# Patient Record
Sex: Female | Born: 1940
Health system: Southern US, Community
[De-identification: ages and names within clinical notes are randomized; demographics above are authoritative.]

## PROBLEM LIST (undated history)

## (undated) DIAGNOSIS — I493 Ventricular premature depolarization: Secondary | ICD-10-CM

## (undated) DIAGNOSIS — I77810 Thoracic aortic ectasia: Secondary | ICD-10-CM

## (undated) DIAGNOSIS — T8859XA Other complications of anesthesia, initial encounter: Secondary | ICD-10-CM

## (undated) DIAGNOSIS — I2510 Atherosclerotic heart disease of native coronary artery without angina pectoris: Secondary | ICD-10-CM

## (undated) DIAGNOSIS — I10 Essential (primary) hypertension: Secondary | ICD-10-CM

## (undated) DIAGNOSIS — Z87442 Personal history of urinary calculi: Secondary | ICD-10-CM

## (undated) DIAGNOSIS — M1990 Unspecified osteoarthritis, unspecified site: Secondary | ICD-10-CM

## (undated) DIAGNOSIS — M8580 Other specified disorders of bone density and structure, unspecified site: Secondary | ICD-10-CM

## (undated) DIAGNOSIS — N201 Calculus of ureter: Secondary | ICD-10-CM

## (undated) DIAGNOSIS — I7121 Aneurysm of the ascending aorta, without rupture: Secondary | ICD-10-CM

## (undated) DIAGNOSIS — T4145XA Adverse effect of unspecified anesthetic, initial encounter: Secondary | ICD-10-CM

## (undated) DIAGNOSIS — R112 Nausea with vomiting, unspecified: Secondary | ICD-10-CM

## (undated) DIAGNOSIS — I499 Cardiac arrhythmia, unspecified: Secondary | ICD-10-CM

## (undated) DIAGNOSIS — I5032 Chronic diastolic (congestive) heart failure: Secondary | ICD-10-CM

## (undated) DIAGNOSIS — R233 Spontaneous ecchymoses: Secondary | ICD-10-CM

## (undated) DIAGNOSIS — K219 Gastro-esophageal reflux disease without esophagitis: Secondary | ICD-10-CM

## (undated) DIAGNOSIS — E785 Hyperlipidemia, unspecified: Secondary | ICD-10-CM

## (undated) DIAGNOSIS — M5030 Other cervical disc degeneration, unspecified cervical region: Secondary | ICD-10-CM

## (undated) DIAGNOSIS — R0602 Shortness of breath: Secondary | ICD-10-CM

## (undated) DIAGNOSIS — E669 Obesity, unspecified: Secondary | ICD-10-CM

## (undated) DIAGNOSIS — Z9889 Other specified postprocedural states: Secondary | ICD-10-CM

## (undated) DIAGNOSIS — I712 Thoracic aortic aneurysm, without rupture: Secondary | ICD-10-CM

## (undated) DIAGNOSIS — R238 Other skin changes: Secondary | ICD-10-CM

## (undated) HISTORY — DX: Other specified disorders of bone density and structure, unspecified site: M85.80

## (undated) HISTORY — DX: Unspecified osteoarthritis, unspecified site: M19.90

## (undated) HISTORY — DX: Aneurysm of the ascending aorta, without rupture: I71.21

## (undated) HISTORY — DX: Obesity, unspecified: E66.9

## (undated) HISTORY — DX: Hyperlipidemia, unspecified: E78.5

## (undated) HISTORY — DX: Shortness of breath: R06.02

## (undated) HISTORY — PX: ABDOMINAL HYSTERECTOMY: SHX81

## (undated) HISTORY — DX: Atherosclerotic heart disease of native coronary artery without angina pectoris: I25.10

## (undated) HISTORY — DX: Gastro-esophageal reflux disease without esophagitis: K21.9

## (undated) HISTORY — DX: Ventricular premature depolarization: I49.3

## (undated) HISTORY — DX: Thoracic aortic aneurysm, without rupture: I71.2

## (undated) HISTORY — DX: Chronic diastolic (congestive) heart failure: I50.32

## (undated) HISTORY — DX: Thoracic aortic ectasia: I77.810

## (undated) HISTORY — PX: TONSILLECTOMY: SUR1361

## (undated) HISTORY — PX: OTHER SURGICAL HISTORY: SHX169

## (undated) HISTORY — PX: KNEE ARTHROSCOPY: SUR90

---
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
## 1967-10-20 HISTORY — PX: HEMORRHOID SURGERY: SHX153

## 1970-10-19 HISTORY — PX: DILATION AND CURETTAGE OF UTERUS: SHX78

## 1986-10-19 HISTORY — PX: CHOLECYSTECTOMY: SHX55

## 1989-06-19 HISTORY — PX: OTHER SURGICAL HISTORY: SHX169

## 1998-10-19 HISTORY — PX: CYST REMOVAL HAND: SHX6279

## 1999-03-25 ENCOUNTER — Other Ambulatory Visit: Admission: RE | Admit: 1999-03-25 | Discharge: 1999-03-25 | Payer: Self-pay | Admitting: Orthopedic Surgery

## 2000-01-23 ENCOUNTER — Emergency Department (HOSPITAL_COMMUNITY): Admission: EM | Admit: 2000-01-23 | Discharge: 2000-01-23 | Payer: Self-pay | Admitting: Emergency Medicine

## 2000-03-18 ENCOUNTER — Encounter (INDEPENDENT_AMBULATORY_CARE_PROVIDER_SITE_OTHER): Payer: Self-pay | Admitting: *Deleted

## 2000-03-18 ENCOUNTER — Ambulatory Visit (HOSPITAL_COMMUNITY): Admission: RE | Admit: 2000-03-18 | Discharge: 2000-03-18 | Payer: Self-pay | Admitting: Gastroenterology

## 2000-04-06 ENCOUNTER — Emergency Department (HOSPITAL_COMMUNITY): Admission: EM | Admit: 2000-04-06 | Discharge: 2000-04-06 | Payer: Self-pay | Admitting: Emergency Medicine

## 2002-01-03 ENCOUNTER — Encounter: Payer: Self-pay | Admitting: Family Medicine

## 2002-01-03 ENCOUNTER — Encounter: Admission: RE | Admit: 2002-01-03 | Discharge: 2002-01-03 | Payer: Self-pay | Admitting: Family Medicine

## 2002-04-15 ENCOUNTER — Encounter: Payer: Self-pay | Admitting: Emergency Medicine

## 2002-04-15 ENCOUNTER — Emergency Department (HOSPITAL_COMMUNITY): Admission: EM | Admit: 2002-04-15 | Discharge: 2002-04-15 | Payer: Self-pay | Admitting: Emergency Medicine

## 2002-09-11 ENCOUNTER — Encounter: Payer: Self-pay | Admitting: Family Medicine

## 2002-09-11 ENCOUNTER — Encounter: Admission: RE | Admit: 2002-09-11 | Discharge: 2002-09-11 | Payer: Self-pay | Admitting: Family Medicine

## 2003-09-10 ENCOUNTER — Encounter: Admission: RE | Admit: 2003-09-10 | Discharge: 2003-09-10 | Payer: Self-pay | Admitting: Family Medicine

## 2004-02-04 ENCOUNTER — Ambulatory Visit (HOSPITAL_COMMUNITY): Admission: RE | Admit: 2004-02-04 | Discharge: 2004-02-06 | Payer: Self-pay | Admitting: Cardiology

## 2004-02-05 HISTORY — PX: CORONARY ANGIOPLASTY: SHX604

## 2004-08-19 ENCOUNTER — Encounter: Admission: RE | Admit: 2004-08-19 | Discharge: 2004-08-19 | Payer: Self-pay | Admitting: Cardiology

## 2004-10-10 ENCOUNTER — Encounter (INDEPENDENT_AMBULATORY_CARE_PROVIDER_SITE_OTHER): Payer: Self-pay | Admitting: *Deleted

## 2004-10-10 ENCOUNTER — Ambulatory Visit (HOSPITAL_COMMUNITY): Admission: RE | Admit: 2004-10-10 | Discharge: 2004-10-10 | Payer: Self-pay | Admitting: Gastroenterology

## 2005-05-25 ENCOUNTER — Inpatient Hospital Stay (HOSPITAL_COMMUNITY): Admission: AD | Admit: 2005-05-25 | Discharge: 2005-05-27 | Payer: Self-pay | Admitting: Cardiology

## 2007-01-18 HISTORY — PX: EYE SURGERY: SHX253

## 2007-05-19 ENCOUNTER — Ambulatory Visit (HOSPITAL_COMMUNITY): Admission: RE | Admit: 2007-05-19 | Discharge: 2007-05-19 | Payer: Self-pay | Admitting: Cardiology

## 2007-09-09 ENCOUNTER — Encounter: Admission: RE | Admit: 2007-09-09 | Discharge: 2007-09-09 | Payer: Self-pay | Admitting: Specialist

## 2007-11-18 ENCOUNTER — Encounter: Admission: RE | Admit: 2007-11-18 | Discharge: 2007-11-18 | Payer: Self-pay | Admitting: Gastroenterology

## 2007-11-19 ENCOUNTER — Emergency Department (HOSPITAL_COMMUNITY): Admission: EM | Admit: 2007-11-19 | Discharge: 2007-11-19 | Payer: Self-pay | Admitting: Emergency Medicine

## 2008-10-19 HISTORY — PX: COLONOSCOPY: SHX174

## 2008-12-28 ENCOUNTER — Encounter: Admission: RE | Admit: 2008-12-28 | Discharge: 2008-12-28 | Payer: Self-pay | Admitting: Family Medicine

## 2009-12-05 ENCOUNTER — Encounter: Admission: RE | Admit: 2009-12-05 | Discharge: 2009-12-05 | Payer: Self-pay | Admitting: Family Medicine

## 2010-02-16 HISTORY — PX: HERNIA REPAIR: SHX51

## 2010-02-20 ENCOUNTER — Ambulatory Visit (HOSPITAL_COMMUNITY): Admission: RE | Admit: 2010-02-20 | Discharge: 2010-02-21 | Payer: Self-pay | Admitting: Surgery

## 2010-11-08 ENCOUNTER — Other Ambulatory Visit: Payer: Self-pay | Admitting: Family Medicine

## 2010-11-08 DIAGNOSIS — Z139 Encounter for screening, unspecified: Secondary | ICD-10-CM

## 2010-12-08 ENCOUNTER — Ambulatory Visit
Admission: RE | Admit: 2010-12-08 | Discharge: 2010-12-08 | Disposition: A | Discharge status: Home / Self Care | Payer: MEDICARE | Source: Ambulatory Visit | Attending: Family Medicine | Admitting: Family Medicine

## 2010-12-08 DIAGNOSIS — Z139 Encounter for screening, unspecified: Secondary | ICD-10-CM

## 2011-01-06 LAB — DIFFERENTIAL
Basophils Absolute: 0 10*3/uL (ref 0.0–0.1)
Basophils Relative: 1 % (ref 0–1)
Eosinophils Absolute: 0.1 10*3/uL (ref 0.0–0.7)
Eosinophils Relative: 1 % (ref 0–5)
Lymphocytes Relative: 21 % (ref 12–46)
Lymphs Abs: 1 10*3/uL (ref 0.7–4.0)
Monocytes Absolute: 0.7 10*3/uL (ref 0.1–1.0)

## 2011-01-06 LAB — CBC
HCT: 37.6 % (ref 36.0–46.0)
MCV: 98.7 fL (ref 78.0–100.0)
Platelets: 168 10*3/uL (ref 150–400)
RDW: 13.8 % (ref 11.5–15.5)

## 2011-01-06 LAB — BASIC METABOLIC PANEL
BUN: 17 mg/dL (ref 6–23)
Creatinine, Ser: 0.89 mg/dL (ref 0.4–1.2)
GFR calc Af Amer: >60 mL/min (ref >60)
GFR calc non Af Amer: >60 mL/min (ref >60)
Glucose, Bld: 116 mg/dL — ABNORMAL HIGH (ref 70–99)
Potassium: 4.3 mEq/L (ref 3.5–5.1)

## 2011-01-06 LAB — URINALYSIS, ROUTINE W REFLEX MICROSCOPIC
Glucose, UA: NEGATIVE mg/dL
Leukocytes, UA: NEGATIVE
Nitrite: POSITIVE — AB
pH: 6.5 (ref 5.0–8.0)

## 2011-02-10 ENCOUNTER — Other Ambulatory Visit: Payer: Self-pay | Admitting: Physician Assistant

## 2011-03-06 NOTE — Op Note (Signed)
NAMECECILEE, ROSNER              ACCOUNT NO.:  [id number]   MEDICAL RECORD NO.:  [id number]          PATIENT TYPE:  AMB   LOCATION:  ENDO                         FACILITY:  [facility]   PHYSICIAN:  Petra Kuba, M.D.    DATE OF BIRTH:  [birth year]   DATE OF PROCEDURE:  10/10/2004  DATE OF DISCHARGE:                                 OPERATIVE REPORT   PROCEDURE:  Colonoscopy with biopsy.   INDICATION:  Bright red blood per rectum, patient due for colonic screening,  history of colon polyps.   CONSENT:  Consent was signed after risks, benefits, methods, options  thoroughly discussed multiple times in the past.   MEDICINES USED:  Demerol 100, Versed 10.   PROCEDURE:  Rectal inspection pertinent for external hemorrhoids.  Small  digital exam was negative.  Video pediatric adjustable colonoscope was then  inserted with some difficulty due to a tortuous sigmoid.  It was able to be  advanced to the cecum.  This required rolling her on her back and some  abdominal pressure.  No obvious abnormality was seen on insertion.  The  cecum was identified by the appendiceal orifice and the ileocecal valve.  In  fact, the scope was inserted a short ways in the terminal ileum which was  normal.  Photo documentation was obtained.  The scope was slowly withdrawn.  Prep was adequate.  There was some liquid stool that required washing and  suctioning.  On slow withdrawal through the colon, cecum, ascending,  transverse, and descending were all normal.  As the scope was withdrawn  around the sigmoid, a few tiny hyperplastic-appearing polyps were seen, were  cold biopsied, and put in the first container.  There were a few in the  rectum, as well, which were cold biopsied and put in the same container.  No  other abnormalities were seen.  The anorectal pull-through on retroflexion  confirmed some small hemorrhoids.  The scope was straightened and re-  advanced a short ways up the left side of the colon.  Air  was suctioned and  scope removed.  The patient tolerated the procedure well adequately.  There  was no obvious immediate complication.   ENDOSCOPIC DIAGNOSES:  1.  Internal and external hemorrhoids.  2.  Tortuous sigmoid.  3.  Multiple tiny rectal and distal sigmoid hyperplastic-appearing polyps      cold biopsied.  4.  Otherwise within normal limits to the terminal ileum.   PLAN:  Await pathology to determine future colonic screening.  Continue  workup with an esophagogastroduodenoscopy for her upper tract symptoms.       MEM/MEDQ  D:  10/10/2004  T:  10/11/2004  Job:  [id number]   cc:   Petra Kuba, M.D.  [address], [address]  [place]  [place] [address]  Fax: [phone]   Armanda Magic, M.D.  [address], [address]  [place], [place] [address]  Fax: [phone]   Donia Guiles, M.D.  [address] [place]  [place] [address]  Fax: [phone]

## 2011-03-06 NOTE — Discharge Summary (Signed)
NAMESARETTA, DAHLEM              ACCOUNT NO.:  [id number]   MEDICAL RECORD NO.:  [id number]          PATIENT TYPE:  INP   LOCATION:  4711                         FACILITY:  [facility]   PHYSICIAN:  Armanda Magic, M.D.     DATE OF BIRTH:  1941/01/09   DATE OF ADMISSION:  05/25/2005  DATE OF DISCHARGE:  05/27/2005                                 DISCHARGE SUMMARY   CHIEF COMPLAINT/REASON FOR ADMISSION:  Ms. Olvera is a 70 year old female  patient with history of CAD status post Taxus stent to the mid LAD in April  of 2005.  Also history of hypertension and dyslipidemia.  Last cath in  November of 2005 due to complaints of chest pain.  Stent was patent with a  30% narrowing distal to the stent that was attributed to patient's native  LAD anatomy.  The patient was subsequently started on Imdur and has had no  significant problems since.  She presented to the office on the date of  admission for her six month followup visit.  She related a one month history  of increased fatigue, exertional chest pain with associated shortness of  breath.  Pain was located in the left breast without radiation.  She had a  severe episode that was very sharp in nature and caused the patient to  double over.  The duration was about 15 minutes.  Otherwise the duration of  the pain was several minutes.  The patient has noticed chest pain with  exertion with such activity as changing bed linens or walking across the  parking lot.  Because of these symptoms, Dr. Mayford Knife was concerned about  possible progress of cardiovascular disease versus possible end-stent  restenosis, so she was admitted for planned diagnostic coronary angiography.   HOSPITAL COURSE:  Problem 1. Chest pain with history of single vessel  coronary artery disease.  The patient was admitted to [facility] to  telemetry unit.  Her home medications were continued including beta-blocker,  aspirin and Plavix.  She was started on Lovenox subcu q.12h. and  recommended  to start to IV nitroglycerin if she had continued chest pain.   She was taken to the cath lab on May 26, 2005 per Dr. Mayford Knife and there  were no changes from previous catheterization.  She still had the 30% area  distal to the stent as this was in the LAD.  She also had a widely patent  RCA, LVEF was normal.  Because of the shortness of breath and chest pain,  Dr. Mayford Knife was concerned about possible pulmonary embolus.  The patient  underwent a VQ scan on May 27, 2005 that showed no perfusion defects.  At  this time, Dr. Mayford Knife felt that pain was GI etiology.  Nexium 40 mg was  added and patient was determined appropriate for discharge home.   Because of the increasing fatigue, a TSH was checked this admission and this  was found to be normal at 2.544.   Problem 2. Hypokalemia.  The patient's potassium on the morning of the  catheterization was slightly low at 3.3.  She was given  supplemental  potassium pre-catheterization.  A repeat BMET was checked on date of  discharge.  Potassium was 3.7.  The patient will be continued on K-Dur 20  mEq at discharge.   FINAL DISCHARGE DIAGNOSES:  1.  Atypical chest pain with normal cardiac catheterization.  Please see      cath report for details.  2.  Known single vessel coronary artery disease.  Previous stent to the left      anterior descending.  3.  Hypertension, controlled.  4.  Hypokalemia.  5.  Dyslipidemia.  6.  Suspected gastroesophageal reflux disease.  New start on Nexium.   DISCHARGE MEDICATIONS:  1.  Aspirin 81 mg daily.  2.  Norvasc 10 mg daily.  3.  Micardis/HCT 40/12.5 daily.  4.  Zyrtec 10 mg daily.  5.  Vesicare 5 mg daily.  6.  Mobic 15 mg daily.  7.  Actonel 35 mg weekly.  8.  Astelin nasal spray at bedtime.  9.  Nasonex nasal spray at bedtime.  10. Patanol eye drops as previous.  11. Plavix 75 mg daily.  12. Toprol-XL 25 mg daily.  13. Fish oil caplets as before.  14. Imdur 30 mg daily.  15.  Vytorin 10/80 daily.  16. Tums 600 mg b.i.d.  17. Vitamin C, E and vitamin D as before.  18. K-Dur 20 mEq once daily.  19. Nexium 40 mg daily.   RESTRICTIONS:  Heart healthy diet.  Activity--increase activity slowly, may  shower only for the next two days.  No lifting more than 10 pounds in the  next two days.  Wound care--call if any swelling or increased bruising in  the right groin.   FOLLOWUP APPOINTMENTS:  She is to call Dr. Roselie Skinner office to be scheduled  for basic followup appointment regarding reflux.  She is to see Dr. Mayford Knife  in two weeks for a groin check.      Allison L. Ulla Gallo, M.D.  Electronically Signed    ALE/MEDQ  D:  05/27/2005  T:  05/27/2005  Job:  [id number]   cc:   Donia Guiles, M.D.  [address] [place]  [place] [address]  Fax: [phone]

## 2011-03-06 NOTE — Op Note (Signed)
Brittany Robertson, Brittany Robertson              ACCOUNT NO.:  [id number]   MEDICAL RECORD NO.:  [id number]          PATIENT TYPE:  AMB   LOCATION:  ENDO                         FACILITY:  [facility]   PHYSICIAN:  Petra Kuba, M.D.    DATE OF BIRTH:  1941/06/09   DATE OF PROCEDURE:  10/10/2004  DATE OF DISCHARGE:                                 OPERATIVE REPORT   PROCEDURE:  Esophagogastroduodenoscopy.   INDICATION:  Upper tract symptoms, some dysphagia.  Consent was signed after  risks, benefits and method was thoroughly discussed in the office on  multiple occasions.   MEDICATIONS:  Demerol 25 and Versed 2.5.   PROCEDURE IN DETAIL:  The video endoscope was inserted by direct vision.  I  doubted she had a hiatal hernia, the esophagus was normal.  The scope passed  into the stomach and advanced to the antrum were some minimal linear  antritis was seen.  Advanced through a normal pylorus into a normal duodenal  bulb and around the second portion of the duodenum.   The scope was withdrawn back to the bulb which was normal.  The scope was  withdrawn back to the stomach and retroflexed.  The angularis, cardia and  fundus, lesser and greater curvatures were normal on retroflexed  visualization.  Straight visualization of the stomach did not reveal any  additional findings.  Air was suctioned, the scope was slowly withdrawn and  again good look at the esophagus was normal.  The scope was withdrawn.  The  patient tolerated the procedure well.  There was no obvious immediate  complications.   ENDOSCOPIC DIAGNOSIS:  1.  Doubt hiatal hernia currently.  2.  Minimal antritis.  3.  Otherwise, normal esophagogastroduodenoscopy.   PLAN:  Barium swallow and barium tablet p.r.n. if swallowing problems  continue, although it seems to be just to pills only and probably  oropharyngeal dysphagia.  Continue Nexium.  Followup p.r.n. or in two months  to recheck symptoms and no further work-up plans are  needed.       MEM/MEDQ  D:  10/10/2004  T:  10/11/2004  Job:  [id number]

## 2011-03-06 NOTE — Cardiovascular Report (Signed)
Brittany Robertson, Brittany Robertson              ACCOUNT NO.:  [id number]   MEDICAL RECORD NO.:  [id number]          PATIENT TYPE:  INP   LOCATION:  4711                         FACILITY:  [facility]   PHYSICIAN:  Armanda Magic, M.D.     DATE OF BIRTH:  [birth year]   DATE OF PROCEDURE:  05/26/2005  DATE OF DISCHARGE:                              CARDIAC CATHETERIZATION   REFERRING PHYSICIAN:  Dr. Donia Guiles.   PROCEDURE:  Left heart catheterization, coronary angiography, left  ventriculography.   INDICATIONS:  Chest pain and shortness of breath.   COMPLICATIONS:  None.   IV ACCESS:  Via right femoral artery 6-French sheath.   This is a very pleasant 70 year old white female with a history of coronary  disease status post PCA stenting of the mid LAD with a Taxus stent in April  2005. She now presents with recurrent chest pain for cardiac  catheterization.   The patient is brought to cardiac catheterization laboratory in a fasting  nonsedated state. Informed consent was obtained. The patient was connected  to continuous heart rate and pulse oximetry monitoring and intermittent  blood pressure monitoring. The right groin was prepped and draped in a  sterile fashion. 1% Xylocaine was used for local anesthesia. Using modified  Seldinger technique, a 6-French sheath was placed in the right femoral  artery. Under fluoroscopic guidance, a 6-French JL-4 catheter was placed in  the left coronary artery. Multiple cine films were taken at 30 RAO and 40  LAO views. This catheter was then exchanged out over guide wire for 6-French  JR-4 catheter which was placed under fluoroscopic guidance in the right  coronary artery. Multiple cine films were taken at 30 RAO view using total  of 30 cc of contrast at 15 cc per second. The catheter was then pulled back  across the aortic valve with no significant gradient noted. At the end  procedure, all catheters and sheaths were removed. Manual compression was  performed until adequate hemostasis was obtained. The patient was  transferred back to room in stable condition.   RESULTS:  Left main coronary is widely patent and bifurcates in the left  anterior descending artery and left circumflex artery. Left anterior  descending artery has a 30% narrowing in the midportion of the vessel. There  is a stent in the proximal to midportion at the takeoff of a diagonal branch  and just distal to the stent which is widely patent and a 30% narrowing. The  ongoing LAD is widely patent throughout the course of the apex. The diagonal  branch is widely patent. It bifurcates into two daughter branches both of  which are widely patent.   Left circumflex is widely patent throughout its course giving rise to two  obtuse marginal branches both of which are widely patent.   The right coronary is widely patent throughout its course and bifurcates  distally in the posterior descending artery and posterolateral artery both  of which are widely patent.   Left ventriculography shows normal LV systolic function, EF 60%, LV pressure  110/4 mmHg, aortic pressure 114/61 mmHg.   ASSESSMENT:  1.  Nonobstructive coronary disease with a widely patent left anterior      descending artery stent and 30% stenosis distal to the stent.  2.  Normal left ventricular function.  3.  Noncardiac chest pain.   PLAN:  Check a VQ scan to rule out PE secondary to increasing shortness of  breath, chest pain and fatigue. If VQ scan is negative, discharge to home  after IV fluid and bedrest. Continue current medications. Add Nexium 40  milligrams a day. Follow up with nurse practitioner in two weeks for groin  check and follow up Dr. Donia Guiles for further workup of noncardiac  chest pain.      Armanda Magic, M.D.  Electronically Signed     TT/MEDQ  D:  05/26/2005  T:  05/27/2005  Job:  [id number]   cc:   Donia Guiles, M.D.  [address] [place]  [place] [address]  Fax:  [phone]

## 2011-03-06 NOTE — Cardiovascular Report (Signed)
NAME:  Brittany Robertson, Brittany Robertson                        ACCOUNT NO.:  [id number]   MEDICAL RECORD NO.:  [id number]                   PATIENT TYPE:  OIB   LOCATION:  6523                                 FACILITY:  [facility]   PHYSICIAN:  Francisca December, M.D.               DATE OF BIRTH:  [birth year]   DATE OF PROCEDURE:  02/05/2004  DATE OF DISCHARGE:                              CARDIAC CATHETERIZATION   INDICATION:  A 70 year old woman with atypical angina has undergone coronary  angiography by Dr. Armanda Magic revealing a subtotal stenosis of the mid  anterior descending artery.  She is brought now to the cardiac  catheterization laboratory to allow for percutaneous revascularization at  this time.   PROCEDURAL NOTE:  The patient was brought to the cardiac catheterization  laboratory in a fasting state.  The right groin was prepped and draped in  the usual sterile fashion.  Local anesthesia was obtained with infiltration  of 1% lidocaine.  A #6 French catheter sheath was inserted percutaneously  into the right femoral artery using an anterior approach over a guiding J  wire.  A #6 French #4 CLS guiding catheter was advanced to the ascending  aorta where the left coronary os was engaged.  The patient received 5700  units of heparin intravenously as well as a high bolus of Aggrastat at 25  mcg/kg and then a constant infusion at 0.15 mg/kg/min.  The resultant ACT  was 313 seconds.  A 0.014 inch Scimed Luge intracoronary guidewire was  passed across the lesion of the mid LAD without difficulty.  The lesion was  initially dilated with a 3.0 x 15 mm Scimed Maverick intracoronary balloon.  This was inflamed to 6 atmospheres for 36 seconds.  This device was removed  and a 3.5/16 mm Scimed Taxus Express II intracoronary drug-eluting stent was  advanced into place across the lesion.  This was deployed to a peak pressure  of 14 atmospheres for 60 seconds.  At this point, the balloon was withdrawn  as well  as the wire, which was then advanced down a large diagonal branch.  The Taxus balloon was used to inflate the side branch to 6 atmospheres for  19 seconds.  The wire was then withdrawn out of the diagonal branch,  replaced down the main anterior descending artery and the Taxus balloon  again inflated to 9 atmospheres for 20 seconds.  It was apparent at this  point, that plaque shifting was occurring.  The initial Luge wire could not  longer be manipulated down the diagonal.  It was remove and replaced with a  new Luge.  This was placed down the diagonal without difficulty.  The 3.0/15  mm Scimed Maverick intracoronary balloon was placed across the origin of the  diagonal and inflated there to 4 atmospheres for 23 seconds.  It was  withdrawn, as well as the wire.  The wire was re-advanced down  the anterior  descending artery and the Taxus balloon replaced across the stent.  It was  inflated to 6 atmospheres for 19 seconds.  These maneuvers resulted in good  patency of both the main branch and the large side branch.  Following  confirmation of adequate patency in orthogonal views both with and without  the guidewire in place, the guiding catheter was removed.  An RAO right  femoral arteriogram was performed using the side port of the sheath for  injection.  It demonstrated the arteriotomy site to be just above the  bifurcation of the profunda femoris and superficial femoral arteries.  There  was no significant atherosclerotic disease seen.  Angio-Seal was then  successfully deployed with good hemostasis following removal of the arterial  sheath.   The patient was transported to recovery area in stable condition with an  intact distal pulse.   ANGIOGRAPHY:  As mentioned, the lesion treated was a 95% stenosis in the mid  portion of the left anterior descending artery, just after the bifurcation  to a large diagonal branch.  Following balloon dilatation and stent  implantation, there was no  residual stenosis of the anterior descending  artery or in the diagonal branch itself.   IMPRESSION:  1. Atherosclerotic cardiovascular disease, single vessel.  2. Status post successful PCI/drug-eluting stent implantation, mid anterior     descending artery.                                               Francisca December, M.D.    JHE/MEDQ  D:  02/05/2004  T:  02/07/2004  Job:  [id number]   cc:   Armanda Magic, M.D.  [address]  [place], [place] [address]  Fax: [phone]

## 2011-03-06 NOTE — Procedures (Signed)
[place]  Patient:    Brittany Robertson, Brittany Robertson                       MRN: [id number] Proc. Date: 03/18/00 Adm. Date:  [id number] Disc. Date: [id number] Attending:  Nelda Marseille CC:         Desma Maxim, M.D.                           Procedure Report  PROCEDURE:  Colonoscopy with biopsy.  ENDOSCOPIST:  Petra Kuba, M.D.  INDICATIONS:  Bright red blood per rectum in a patient with probable IBS and hemorrhoids.  Consent was signed after risks, benefits, methods, and options were thoroughly discussed in the office.  MEDICINES USED:  Demerol 100 and Versed 10.  DESCRIPTION OF PROCEDURE:  Rectal inspection was pertinent for obvious external hemorrhoids.  Digital exam was negative.  The video pediatric colonoscope was inserted and fairly easily advanced around the colon to the cecum.  This did require some left lower quadrant pressure, but no position changes.  The cecum was identified by the appendiceal orifice and the ileocecal valve.  Prep was adequate.  There was some liquid stool that required washing and suctioning.  On slow withdrawal through the colon, the cecum, ascending, and transverse were normal.  In the ______ splenic flexure, a tiny 1-2 mm polyp along the fold was seen and cold biopsied x 1.  The scope was further withdrawn.  There were additional multiple tiny left-sided hyperplastic-appearing polyps which were cold biopsied and all put in the same container.  No other abnormalities were seen as we slowly withdrew back to the rectum.  None of these polyps had a worrisome appearance, and were all 1-2 mm. Once back in the rectum, the scope was retroflexed and pertinent for some internal hemorrhoids.  The scope was straigthened and readvanced a short ways up the sigmoid.  Air was suctioned and the scope removed.  The patient tolerated the procedure well.  There was no obvious or immediate complication.  ENDOSCOPIC DIAGNOSES: 1.  Internal and external hemorrhoids. 2. Multiple tiny left-sided hyperplastic-appearing polyps, status post cold    biopsy. 3. Otherwise within normal limits to the cecum.  PLAN:  Await pathology to determine future colonic screening.  Continue workup with an EGD. DD:  03/18/00 TD:  03/22/00 Job: [id number]

## 2011-03-06 NOTE — Procedures (Signed)
[place]  Patient:    Brittany Robertson, Brittany Robertson                       MRN: [id number] Proc. Date: 03/18/00 Adm. Date:  [id number] Disc. Date: [id number] Attending:  Nelda Marseille CC:         Petra Kuba, M.D.             Desma Maxim, M.D.                           Procedure Report  PROCEDURE:  Esophagogastroduodenoscopy.  INDICATIONS:  Upper tract symptoms.  Consent was signed after risks, benefits, methods, and options thoroughly discussed in the office.  MEDICINES USED:  No additional medicines, since she already had the colonoscopy.  DESCRIPTION OF PROCEDURE:  The video endoscope was inserted by direct vision. The esophagus was pertinent for some mild reflux changes but no signs of Barretts or significant esophagitis.  In the distal esophagus was a small hiatal hernia.  The scope had trouble holding air and did have some retching throughout the procedure.  The scope was inserted through her pylorus into a normal duodenal bulb and around the _____ to a normal second portion of the duodenum.  The scope was withdrawn back to the bulb, and a quick look there confirmed its normal appearance.  The scope was withdrawn back to the stomach and retroflexed.  Cardia, fundus, angularis, lesser and greater curve were all evaluated on retroflex and then straight visualization and other than some mild gastritis, no other abnormalities were seen.  The scope was then slowly withdrawn through the esophagus.  Although she continued to retch, only the minimal reflux changes were seen without any worrisome esophageal lesion.  The scope was removed.  The patient tolerated the procedure fairly well.  There was no obvious immediate complication.  ENDOSCOPIC DIAGNOSES: 1. Small hiatal hernia. 2. Minimal esophageal reflux changes. 3. Minimal gastritis throughout. 4. Otherwise within normal limits EGD.  PLAN:  Continue Nexium.  Follow up p.r.n. or in six to  eight weeks to recheck symptoms, probably guaiacs, and make sure no further workup plans are needed. DD:  03/18/00 TD:  03/22/00 Job: [id number]

## 2011-03-06 NOTE — Cardiovascular Report (Signed)
NAME:  Brittany Robertson, Brittany Robertson                        ACCOUNT NO.:  [id number]   MEDICAL RECORD NO.:  [id number]                   PATIENT TYPE:  OIB   LOCATION:  6523                                 FACILITY:  [facility]   PHYSICIAN:  Armanda Magic, M.D.                  DATE OF BIRTH:  08/22/1941   DATE OF PROCEDURE:  02/04/2004  DATE OF DISCHARGE:                              CARDIAC CATHETERIZATION   PROCEDURE:  1. Left heart catheterization.  2. Coronary angiography.  3. Left ventriculography.   OPERATOR:  Armanda Magic, M.D.   INDICATIONS:  Chest pain, abnormal Cardiolite.   COMPLICATIONS:  None.   IV ACCESS:  Via right femoral artery with 6 French sheath.   This is a 70 year old female with no previous cardiac history.  She does  have a history though of hyperlipidemia and hypertension and a family  history of coronary disease, also shortness of breath.  She now presents for  heart catheterization for workup of abnormal Cardiolite, chest pain and  shortness of breath.   The patient is brought to the cardiac catheterization laboratory in a  fasting nonsedated state.  Informed consent was obtained.  The patient was  connected to continuous heart rate and pulse oximetry monitoring,  intermittent blood pressure monitoring.  The right groin was prepped and  draped in a sterile fashion.  1% Xylocaine was used for local anesthesia.  Using modified Seldinger technique, a 6 French sheath was placed in the  right femoral artery.  Under fluoroscopic guidance, a 6 [place] JL-4 catheter  was placed in the left coronary artery.  Multiple cine films were taken at  30-degree RAO, 40-degree LAO views.  This catheter was then  exchanged out  over guide wire for 6 [place] JR-4 catheter which was placed under  fluoroscopic guidance in the right coronary artery.  Multiple cine films  were taken at 30-degree RAO, 40-degree LAO views.  The catheter was then  exchanged over guide wire for 6 French angled  pigtail catheter which was  placed under fluoroscopic guidance in the left ventricular cavity.  Left  ventriculography was performed in 30-degree RAO view using total 30 ml  contrast at 15 ml per second.  The catheter was then pulled back across the  aortic valve with no significant gradient.  At the end of the procedure, all  catheters and sheaths were removed.  Manual compression was performed until  adequate hemostasis was obtained.  The patient was transferred back to the  room in stable condition.   RESULTS:  Left main coronary artery is widely patent and bifurcates into the  left anterior descending artery and left circumflex artery.   The left anterior descending artery has a 20-30% proximal narrowing before  the take off of a first diagonal branch.  The diagonal one is widely patent.  Just distal to the take off of the first diagonal, there is a 95%  apple core  lesion or concentric lesion noted.  The rest of the LAD is widely patent to  the apex.  The left circumflex is widely patent and traverses the AV groove.  It gives rise to two high obtuse marginal branches, both of which are widely  patent.   The right coronary artery is widely patent throughout its course and  bifurcates distally in a posterior descending artery and posterior lateral  artery, both of which are widely patent.   Left ventriculography shows normal LV systolic function.  Left ventricular  pressure 133/14 mmHg, aortic pressure 125/70 mmHg, LVEDP 23 mmHg.   ASSESSMENT:  1. One-vessel obstructive coronary artery disease.  2. Normal left ventricular function.   PLAN:  Percutaneous coronary intervention of the LAD per Dr. Amil Amen on  April 19.  Admit to telemetry bed, IV heparin drip, aspirin, Plavix, Toprol  and Norvasc.  Continue all other medications.                                               Armanda Magic, M.D.    TT/MEDQ  D:  02/04/2004  T:  02/05/2004  Job:  [id number]

## 2011-05-29 HISTORY — PX: HAND SURGERY: SHX662

## 2011-07-09 LAB — INFLUENZA A+B VIRUS AG-DIRECT(RAPID): Influenza B Ag: NEGATIVE

## 2011-07-09 LAB — RAPID STREP SCREEN (MED CTR MEBANE ONLY): Streptococcus, Group A Screen (Direct): NEGATIVE

## 2011-09-27 ENCOUNTER — Emergency Department (HOSPITAL_COMMUNITY): Payer: 59

## 2011-09-27 ENCOUNTER — Encounter: Payer: Self-pay | Admitting: Emergency Medicine

## 2011-09-27 ENCOUNTER — Inpatient Hospital Stay (HOSPITAL_COMMUNITY)
Admission: EM | Admit: 2011-09-27 | Discharge: 2011-09-29 | DRG: 206 | Disposition: A | Discharge status: Home Health | Payer: 59 | Attending: General Surgery | Admitting: General Surgery

## 2011-09-27 ENCOUNTER — Other Ambulatory Visit: Payer: Self-pay

## 2011-09-27 DIAGNOSIS — IMO0002 Reserved for concepts with insufficient information to code with codable children: Secondary | ICD-10-CM | POA: Insufficient documentation

## 2011-09-27 DIAGNOSIS — S8000XA Contusion of unspecified knee, initial encounter: Secondary | ICD-10-CM | POA: Diagnosis present

## 2011-09-27 DIAGNOSIS — M25569 Pain in unspecified knee: Secondary | ICD-10-CM | POA: Diagnosis present

## 2011-09-27 DIAGNOSIS — I10 Essential (primary) hypertension: Secondary | ICD-10-CM | POA: Insufficient documentation

## 2011-09-27 DIAGNOSIS — R079 Chest pain, unspecified: Secondary | ICD-10-CM

## 2011-09-27 DIAGNOSIS — G8911 Acute pain due to trauma: Secondary | ICD-10-CM

## 2011-09-27 DIAGNOSIS — S2242XA Multiple fractures of ribs, left side, initial encounter for closed fracture: Secondary | ICD-10-CM | POA: Diagnosis present

## 2011-09-27 DIAGNOSIS — Z880 Allergy status to penicillin: Secondary | ICD-10-CM

## 2011-09-27 DIAGNOSIS — Z886 Allergy status to analgesic agent status: Secondary | ICD-10-CM

## 2011-09-27 DIAGNOSIS — S2249XA Multiple fractures of ribs, unspecified side, initial encounter for closed fracture: Principal | ICD-10-CM | POA: Diagnosis present

## 2011-09-27 DIAGNOSIS — S20219A Contusion of unspecified front wall of thorax, initial encounter: Secondary | ICD-10-CM | POA: Diagnosis present

## 2011-09-27 DIAGNOSIS — Z882 Allergy status to sulfonamides status: Secondary | ICD-10-CM

## 2011-09-27 DIAGNOSIS — W19XXXA Unspecified fall, initial encounter: Secondary | ICD-10-CM | POA: Diagnosis present

## 2011-09-27 DIAGNOSIS — E669 Obesity, unspecified: Secondary | ICD-10-CM | POA: Diagnosis present

## 2011-09-27 HISTORY — DX: Essential (primary) hypertension: I10

## 2011-09-27 LAB — CBC
HCT: 36.4 % (ref 36.0–46.0)
Hemoglobin: 12.4 g/dL (ref 12.0–15.0)
MCHC: 34.1 g/dL (ref 30.0–36.0)
MCHC: 33.1 g/dL (ref 30.0–36.0)
Platelets: 150 10*3/uL (ref 150–400)
RBC: 3.85 MIL/uL — ABNORMAL LOW (ref 3.87–5.11)
RDW: 13.4 % (ref 11.5–15.5)
WBC: 4.1 10*3/uL (ref 4.0–10.5)

## 2011-09-27 LAB — DIFFERENTIAL
Lymphocytes Relative: 18 % (ref 12–46)
Lymphs Abs: 1.4 10*3/uL (ref 0.7–4.0)
Monocytes Absolute: 0.9 10*3/uL (ref 0.1–1.0)
Monocytes Relative: 11 % (ref 3–12)
Neutro Abs: 5.5 10*3/uL (ref 1.7–7.7)
Neutrophils Relative %: 70 % (ref 43–77)

## 2011-09-27 LAB — COMPREHENSIVE METABOLIC PANEL
Albumin: 3.5 g/dL (ref 3.5–5.2)
Alkaline Phosphatase: 71 U/L (ref 39–117)
BUN: 27 mg/dL — ABNORMAL HIGH (ref 6–23)
CO2: 22 mEq/L (ref 19–32)
Chloride: 102 mEq/L (ref 96–112)
Creatinine, Ser: 0.83 mg/dL (ref 0.50–1.10)
GFR calc Af Amer: 80 mL/min — ABNORMAL LOW (ref >90)
GFR calc non Af Amer: 69 mL/min — ABNORMAL LOW (ref >90)
Glucose, Bld: 117 mg/dL — ABNORMAL HIGH (ref 70–99)
Potassium: 3.9 mEq/L (ref 3.5–5.1)
Total Bilirubin: 0.5 mg/dL (ref 0.3–1.2)

## 2011-09-27 LAB — CREATININE, SERUM
Creatinine, Ser: 0.7 mg/dL (ref 0.50–1.10)
GFR calc Af Amer: >90 mL/min (ref >90)
GFR calc non Af Amer: 85 mL/min — ABNORMAL LOW (ref >90)

## 2011-09-27 MED ORDER — HYDROMORPHONE 0.3 MG/ML IV SOLN
INTRAVENOUS | Status: DC
  Administered 2011-09-27: 21:00:00 via INTRAVENOUS
  Administered 2011-09-28: 1.2 mL via INTRAVENOUS
  Administered 2011-09-28: 0.9 mg via INTRAVENOUS
  Administered 2011-09-28: 0.6 mL via INTRAVENOUS

## 2011-09-27 MED ORDER — MORPHINE SULFATE 4 MG/ML IJ SOLN
4.0000 mg | Freq: Once | INTRAMUSCULAR | Status: AC
  Administered 2011-09-27: 4 mg via INTRAVENOUS
  Filled 2011-09-27: qty 1

## 2011-09-27 MED ORDER — EZETIMIBE 10 MG PO TABS
10.0000 mg | ORAL_TABLET | Freq: Every day | ORAL | Status: DC
  Administered 2011-09-27 – 2011-09-28 (×2): 10 mg via ORAL
  Filled 2011-09-27 (×3): qty 1

## 2011-09-27 MED ORDER — METOPROLOL SUCCINATE 12.5 MG HALF TABLET
12.5000 mg | ORAL_TABLET | Freq: Every day | ORAL | Status: DC
  Administered 2011-09-28 – 2011-09-29 (×2): 12.5 mg via ORAL
  Filled 2011-09-27 (×2): qty 1

## 2011-09-27 MED ORDER — NITROGLYCERIN 0.4 MG SL SUBL: 0.4000 mg | SUBLINGUAL_TABLET | SUBLINGUAL | Status: DC | PRN

## 2011-09-27 MED ORDER — HYDROCHLOROTHIAZIDE 12.5 MG PO CAPS
12.5000 mg | ORAL_CAPSULE | Freq: Every day | ORAL | Status: DC
  Administered 2011-09-28 – 2011-09-29 (×2): 12.5 mg via ORAL
  Filled 2011-09-27 (×2): qty 1

## 2011-09-27 MED ORDER — ONDANSETRON HCL 4 MG/2ML IJ SOLN
4.0000 mg | Freq: Once | INTRAMUSCULAR | Status: AC
  Administered 2011-09-27: 4 mg via INTRAVENOUS

## 2011-09-27 MED ORDER — PANTOPRAZOLE SODIUM 40 MG PO TBEC
40.0000 mg | DELAYED_RELEASE_TABLET | Freq: Every day | ORAL | Status: DC
  Administered 2011-09-27 – 2011-09-29 (×3): 40 mg via ORAL
  Filled 2011-09-27 (×3): qty 1

## 2011-09-27 MED ORDER — AMLODIPINE BESYLATE 5 MG PO TABS
5.0000 mg | ORAL_TABLET | Freq: Every day | ORAL | Status: DC
  Administered 2011-09-27 – 2011-09-28 (×2): 5 mg via ORAL
  Filled 2011-09-27 (×3): qty 1

## 2011-09-27 MED ORDER — ONDANSETRON HCL 4 MG/2ML IJ SOLN
INTRAMUSCULAR | Status: AC
  Filled 2011-09-27: qty 2

## 2011-09-27 MED ORDER — HYDROMORPHONE 0.3 MG/ML IV SOLN
INTRAVENOUS | Status: AC
  Filled 2011-09-27: qty 25

## 2011-09-27 MED ORDER — ASPIRIN 81 MG PO CHEW
81.0000 mg | CHEWABLE_TABLET | Freq: Every day | ORAL | Status: DC
  Administered 2011-09-27 – 2011-09-28 (×2): 81 mg via ORAL
  Filled 2011-09-27 (×3): qty 1

## 2011-09-27 MED ORDER — SODIUM CHLORIDE 0.9 % IV SOLN
Freq: Once | INTRAVENOUS | Status: AC
  Administered 2011-09-27: 10:00:00 via INTRAVENOUS

## 2011-09-27 MED ORDER — SIMVASTATIN 20 MG PO TABS
20.0000 mg | ORAL_TABLET | Freq: Every day | ORAL | Status: DC
  Administered 2011-09-28: 20 mg via ORAL
  Filled 2011-09-27 (×2): qty 1

## 2011-09-27 MED ORDER — OLMESARTAN MEDOXOMIL 20 MG PO TABS
20.0000 mg | ORAL_TABLET | Freq: Every day | ORAL | Status: DC
  Administered 2011-09-28 – 2011-09-29 (×2): 20 mg via ORAL
  Filled 2011-09-27 (×2): qty 1

## 2011-09-27 MED ORDER — DIPHENHYDRAMINE HCL 50 MG/ML IJ SOLN: 12.5000 mg | Freq: Four times a day (QID) | INTRAMUSCULAR | Status: DC | PRN

## 2011-09-27 MED ORDER — HYDROMORPHONE 0.3 MG/ML IV SOLN: INTRAVENOUS | Status: DC

## 2011-09-27 MED ORDER — ONDANSETRON HCL 4 MG/2ML IJ SOLN: 4.0000 mg | Freq: Four times a day (QID) | INTRAMUSCULAR | Status: DC | PRN

## 2011-09-27 MED ORDER — ONDANSETRON HCL 4 MG/2ML IJ SOLN
4.0000 mg | Freq: Four times a day (QID) | INTRAMUSCULAR | Status: DC | PRN
  Administered 2011-09-27 – 2011-09-28 (×2): 4 mg via INTRAVENOUS
  Filled 2011-09-27: qty 2

## 2011-09-27 MED ORDER — TELMISARTAN-HCTZ 40-12.5 MG PO TABS: 1.0000 | ORAL_TABLET | ORAL | Status: DC

## 2011-09-27 MED ORDER — HYDROMORPHONE HCL PF 1 MG/ML IJ SOLN
INTRAMUSCULAR | Status: AC
  Administered 2011-09-27: 1 mg via INTRAVENOUS
  Filled 2011-09-27: qty 1

## 2011-09-27 MED ORDER — DOCUSATE SODIUM 100 MG PO CAPS
100.0000 mg | ORAL_CAPSULE | Freq: Two times a day (BID) | ORAL | Status: DC
  Administered 2011-09-27 – 2011-09-29 (×4): 100 mg via ORAL
  Filled 2011-09-27 (×4): qty 1

## 2011-09-27 MED ORDER — KCL IN DEXTROSE-NACL 20-5-0.45 MEQ/L-%-% IV SOLN
INTRAVENOUS | Status: DC
  Administered 2011-09-27 – 2011-09-28 (×2): via INTRAVENOUS
  Filled 2011-09-27 (×3): qty 1000

## 2011-09-27 MED ORDER — ONDANSETRON HCL 4 MG PO TABS: 4.0000 mg | ORAL_TABLET | Freq: Four times a day (QID) | ORAL | Status: DC | PRN

## 2011-09-27 MED ORDER — IOHEXOL 300 MG/ML  SOLN
100.0000 mL | Freq: Once | INTRAMUSCULAR | Status: AC | PRN
  Administered 2011-09-27: 100 mL via INTRAVENOUS

## 2011-09-27 MED ORDER — SODIUM CHLORIDE 0.9 % IJ SOLN: 9.0000 mL | INTRAMUSCULAR | Status: DC | PRN

## 2011-09-27 MED ORDER — ENOXAPARIN SODIUM 40 MG/0.4ML ~~LOC~~ SOLN
40.0000 mg | Freq: Every day | SUBCUTANEOUS | Status: DC
  Administered 2011-09-27: 40 mg via SUBCUTANEOUS
  Filled 2011-09-27 (×2): qty 0.4

## 2011-09-27 MED ORDER — ISOSORBIDE MONONITRATE ER 30 MG PO TB24
30.0000 mg | ORAL_TABLET | Freq: Every day | ORAL | Status: DC
  Administered 2011-09-28 – 2011-09-29 (×2): 30 mg via ORAL
  Filled 2011-09-27 (×2): qty 1

## 2011-09-27 MED ORDER — AZELASTINE HCL 0.1 % NA SOLN
1.0000 | Freq: Two times a day (BID) | NASAL | Status: DC
  Filled 2011-09-27: qty 30

## 2011-09-27 MED ORDER — HYDROMORPHONE HCL PF 1 MG/ML IJ SOLN
1.0000 mg | INTRAMUSCULAR | Status: DC | PRN
  Administered 2011-09-27: 1 mg via INTRAVENOUS

## 2011-09-27 MED ORDER — DIPHENHYDRAMINE HCL 12.5 MG/5ML PO ELIX
12.5000 mg | ORAL_SOLUTION | Freq: Four times a day (QID) | ORAL | Status: DC | PRN
  Filled 2011-09-27: qty 5

## 2011-09-27 MED ORDER — NALOXONE HCL 0.4 MG/ML IJ SOLN: 0.4000 mg | INTRAMUSCULAR | Status: DC | PRN

## 2011-09-27 MED ORDER — FESOTERODINE FUMARATE ER 8 MG PO TB24
8.0000 mg | ORAL_TABLET | Freq: Every day | ORAL | Status: DC
  Administered 2011-09-27 – 2011-09-29 (×3): 8 mg via ORAL
  Filled 2011-09-27 (×3): qty 1

## 2011-09-27 NOTE — ED Notes (Signed)
Gave old and new ECG to Dr. Judd Lien after I performed. 11:35 am JG

## 2011-09-27 NOTE — ED Notes (Signed)
Received pt via EMS from home with c/o trip and fall onto knees. Pt c/o B/L knee pain and diaphram pain.

## 2011-09-27 NOTE — ED Provider Notes (Addendum)
History     CSN: [id number] Arrival date & time: 09/27/2011  9:26 AM   First MD Initiated Contact with Patient 09/27/11 0935      Chief Complaint  Patient presents with  . Fall  . Knee Pain    (Consider location/radiation/quality/duration/timing/severity/associated sxs/prior treatment) HPI Comments: Walking into church, tripped over step and fell forward.  She injured both knees and chest.  No head trauma.  No loc.    Patient is a 70 y.o. female presenting with fall and knee pain. The history is provided by the patient.  Fall The accident occurred less than 1 hour ago. The fall occurred while walking. She fell from a height of 1 to 2 ft. She landed on a hard floor. There was no blood loss. The pain is moderate. She was not ambulatory at the scene.  Knee Pain    Past Medical History  Diagnosis Date  . Hypertension   . Degenerative disk disease     Past Surgical History  Procedure Date  . Knee arthroscopy   . Stents     History reviewed. No pertinent family history.  History  Substance Use Topics  . Smoking status: Never Smoker   . Smokeless tobacco: Not on file  . Alcohol Use: Yes    OB History    Grav Para Term Preterm Abortions TAB SAB Ect Mult Living                  Review of Systems  All other systems reviewed and are negative.    Allergies  Codeine; Sulfa antibiotics; and Penicillins  Home Medications  No current outpatient prescriptions on file.  BP 102/42  Pulse 71  Temp(Src) 97.7 F (36.5 C) (Oral)  Resp 21  SpO2 100%  Physical Exam  Nursing note and vitals reviewed. Constitutional: She is oriented to person, place, and time. She appears well-developed.  HENT:  Head: Normocephalic and atraumatic.  Eyes: Pupils are equal, round, and reactive to light.  Neck: Normal range of motion. Neck supple.  Cardiovascular: Normal rate and regular rhythm.   No murmur heard. Pulmonary/Chest: Effort normal and breath sounds normal. No respiratory  distress.  Abdominal: Soft. Bowel sounds are normal. She exhibits no distension. There is no tenderness.  Musculoskeletal:       There is ttp over the anterior chest wall.  There is no crepitus.  Breath sounds are equal bilaterally.   Both knees appear grossly normal.  No effusion noted.  Pain with rom.  Neurological: She is alert and oriented to person, place, and time.  Skin: Skin is warm and dry.    ED Course  Procedures (including critical care time)  Labs Reviewed - No data to display No results found.   No diagnosis found.   Date: 09/27/2011  Rate: 67  Rhythm: normal sinus rhythm  QRS Axis: normal  Intervals: normal  ST/T Wave abnormalities: normal  Conduction Disutrbances:none  Narrative Interpretation:   Old EKG Reviewed: unchanged    MDM  The patient fell and injured her ribs, fracturing two of them.  This was found on CT scan but no other intrathoracic or intra-abdominal pathology was found.  I will consult trauma (Dr. Luisa Hart) who will see the patient in the ED.          Geoffery Lyons, MD 09/27/11 1431  Geoffery Lyons, MD 09/27/11 [phone]

## 2011-09-27 NOTE — ED Notes (Signed)
Attempted to take pt upstairs, pt began to vomit due to "motion sickness." zofran given and told pt we would take her up after the medication began to work.

## 2011-09-27 NOTE — H&P (Signed)
Brittany Robertson is an 70 y.o. female.   Chief Complaint: Fall chest pain HPI: Pt fell at church today.  Fell on chest .  Complaining of chest pain.  Severe especially with a deep breath.  On the left side. Complaining of bilateral knee pain.  Past Medical History  Diagnosis Date  . Hypertension   . Degenerative disk disease     Past Surgical History  Procedure Date  . Knee arthroscopy   . Stents    No current facility-administered medications on file prior to encounter.   No current outpatient prescriptions on file prior to encounter.  No medication comments found. History reviewed. No pertinent family history. Social History:  reports that she has never smoked. She does not have any smokeless tobacco history on file. She reports that she drinks alcohol. She reports that she does not use illicit drugs.  Allergies:  Allergies  Allergen Reactions  . Adhesive (Tape) Other (See Comments)    irritation  . Codeine Nausea And Vomiting  . Sulfa Antibiotics Hives and Itching  . Penicillins Itching and Rash    Medications Prior to Admission  Medication Dose Route Frequency Provider Last Rate Last Dose  . 0.9 %  sodium chloride infusion   Intravenous Once Geoffery Lyons, MD      . iohexol (OMNIPAQUE) 300 MG/ML solution 100 mL  100 mL Intravenous Once PRN Medication Radiologist   100 mL at 09/27/11 1340  . morphine 4 MG/ML injection 4 mg  4 mg Intravenous Once Geoffery Lyons, MD   4 mg at 09/27/11 1028  . morphine 4 MG/ML injection 4 mg  4 mg Intravenous Once Geoffery Lyons, MD   4 mg at 09/27/11 1224  . morphine 4 MG/ML injection 4 mg  4 mg Intravenous Once Geoffery Lyons, MD   4 mg at 09/27/11 1437  . ondansetron (ZOFRAN) injection 4 mg  4 mg Intravenous Once Geoffery Lyons, MD   4 mg at 09/27/11 1120   No current outpatient prescriptions on file as of 09/27/2011.    Results for orders placed during the hospital encounter of 09/27/11 (from the past 48 hour(s))  CBC     Status: Abnormal   Collection Time   09/27/11 11:39 AM      Component Value Range Comment   WBC 7.8  4.0 - 10.5 (K/uL)    RBC 3.85 (*) 3.87 - 5.11 (MIL/uL)    Hemoglobin 12.4  12.0 - 15.0 (g/dL)    HCT 56.2  13.0 - 86.5 (%)    MCV 94.5  78.0 - 100.0 (fL)    MCH 32.2  26.0 - 34.0 (pg)    MCHC 34.1  30.0 - 36.0 (g/dL)    RDW 78.4  69.6 - 29.5 (%)    Platelets 176  150 - 400 (K/uL)   DIFFERENTIAL     Status: Normal   Collection Time   09/27/11 11:39 AM      Component Value Range Comment   Neutrophils Relative 70  43 - 77 (%)    Neutro Abs 5.5  1.7 - 7.7 (K/uL)    Lymphocytes Relative 18  12 - 46 (%)    Lymphs Abs 1.4  0.7 - 4.0 (K/uL)    Monocytes Relative 11  3 - 12 (%)    Monocytes Absolute 0.9  0.1 - 1.0 (K/uL)    Eosinophils Relative 1  0 - 5 (%)    Eosinophils Absolute 0.0  0.0 - 0.7 (K/uL)    Basophils  Relative 0  0 - 1 (%)    Basophils Absolute 0.0  0.0 - 0.1 (K/uL)   COMPREHENSIVE METABOLIC PANEL     Status: Abnormal   Collection Time   09/27/11 11:39 AM      Component Value Range Comment   Sodium 136  135 - 145 (mEq/L)    Potassium 3.9  3.5 - 5.1 (mEq/L)    Chloride 102  96 - 112 (mEq/L)    CO2 22  19 - 32 (mEq/L)    Glucose, Bld 117 (*) 70 - 99 (mg/dL)    BUN 27 (*) 6 - 23 (mg/dL)    Creatinine, Ser 4.09  0.50 - 1.10 (mg/dL)    Calcium 9.4  8.4 - 10.5 (mg/dL)    Total Protein 6.5  6.0 - 8.3 (g/dL)    Albumin 3.5  3.5 - 5.2 (g/dL)    AST 90 (*) 0 - 37 (U/L) HEMOLYSIS AT THIS LEVEL MAY AFFECT RESULT   ALT 56 (*) 0 - 35 (U/L)    Alkaline Phosphatase 71  39 - 117 (U/L)    Total Bilirubin 0.5  0.3 - 1.2 (mg/dL)    GFR calc non Af Amer 69 (*) >90 (mL/min)    GFR calc Af Amer 80 (*) >90 (mL/min)    Dg Ribs Bilateral W/chest  09/27/2011  *RADIOLOGY REPORT*  Clinical Data: Fall.  Bilateral anterior rib and chest wall pain.  BILATERAL RIBS AND CHEST - 4+ VIEW  Comparison: None.  Findings: No rib fractures or other bone lesions are identified. No evidence of pneumothorax or hemothorax.  Low  lung volumes are seen however both lungs are clear.  Heart size and mediastinal contours are within normal limits.  IMPRESSION: Negative.  Original Report Authenticated By: Danae Orleans, M.D.   Ct Chest W Contrast  09/27/2011  *RADIOLOGY REPORT*  Clinical Data:  Fall, upper abdominal pain, lower anterior rib pain  CT CHEST, ABDOMEN AND PELVIS WITH CONTRAST  Technique:  Multidetector CT imaging of the chest, abdomen and pelvis was performed following the standard protocol during bolus administration of intravenous contrast.  Contrast: OMNIPAQUE IOHEXOL 300 MG/ML IV SOLN  Comparison:  None  CT CHEST  Findings:  Minimal dependent atelectasis in the bilateral lower lobes.  No suspicious pulmonary nodules. No pleural effusion or pneumothorax.  Visualized thyroid is unremarkable.  The heart is normal in size.  No pericardial effusion.  Coronary atherosclerosis.  Atherosclerotic of the base of the aortic arch.  No mediastinal hematoma.  No suspicious mediastinal, hilar, or axillary lymphadenopathy.  Degenerative changes of the thoracic spine.  Mild deformity of the left anterolateral 4th and 5th ribs (series 2/images 26 and 31).  IMPRESSION: Mild deformity of the left anterolateral 4th and 5th ribs, correlate with the site of the patient's pain for acute fracture.  Otherwise, no evidence of acute traumatic injury to the chest.  CT ABDOMEN AND PELVIS  Findings:  Small hiatal hernia.  Liver, spleen, pancreas, and adrenal glands within normal limits.  Status post cholecystectomy.  No intrahepatic or extrahepatic ductal dilatation.  Bilateral renal sinus cysts.  No hydronephrosis.  No evidence of bowel obstruction.  Atherosclerotic calcifications of the abdominal aorta and branch vessels.  No suspicious abdominopelvic lymphadenopathy.  No abdominopelvic ascites.  No hemoperitoneum.  No free air.  Status post hysterectomy.  No adnexal masses.  Bladder is within normal limits.  Degenerative changes of the lumbar spine.   No fracture is seen.  IMPRESSION: No evidence of traumatic  injury to the abdomen/pelvis.  Original Report Authenticated By: Charline Bills, M.D.   Ct Abdomen Pelvis W Contrast  09/27/2011  *RADIOLOGY REPORT*  Clinical Data:  Fall, upper abdominal pain, lower anterior rib pain  CT CHEST, ABDOMEN AND PELVIS WITH CONTRAST  Technique:  Multidetector CT imaging of the chest, abdomen and pelvis was performed following the standard protocol during bolus administration of intravenous contrast.  Contrast: OMNIPAQUE IOHEXOL 300 MG/ML IV SOLN  Comparison:  None  CT CHEST  Findings:  Minimal dependent atelectasis in the bilateral lower lobes.  No suspicious pulmonary nodules. No pleural effusion or pneumothorax.  Visualized thyroid is unremarkable.  The heart is normal in size.  No pericardial effusion.  Coronary atherosclerosis.  Atherosclerotic of the base of the aortic arch.  No mediastinal hematoma.  No suspicious mediastinal, hilar, or axillary lymphadenopathy.  Degenerative changes of the thoracic spine.  Mild deformity of the left anterolateral 4th and 5th ribs (series 2/images 26 and 31).  IMPRESSION: Mild deformity of the left anterolateral 4th and 5th ribs, correlate with the site of the patient's pain for acute fracture.  Otherwise, no evidence of acute traumatic injury to the chest.  CT ABDOMEN AND PELVIS  Findings:  Small hiatal hernia.  Liver, spleen, pancreas, and adrenal glands within normal limits.  Status post cholecystectomy.  No intrahepatic or extrahepatic ductal dilatation.  Bilateral renal sinus cysts.  No hydronephrosis.  No evidence of bowel obstruction.  Atherosclerotic calcifications of the abdominal aorta and branch vessels.  No suspicious abdominopelvic lymphadenopathy.  No abdominopelvic ascites.  No hemoperitoneum.  No free air.  Status post hysterectomy.  No adnexal masses.  Bladder is within normal limits.  Degenerative changes of the lumbar spine.  No fracture is seen.  IMPRESSION:  No evidence of traumatic injury to the abdomen/pelvis.  Original Report Authenticated By: Charline Bills, M.D.   Dg Knee Complete 4 Views Left  09/27/2011  *RADIOLOGY REPORT*  Clinical Data: Fall.  Knee injury.  Knee pain and swelling.  LEFT KNEE - COMPLETE 4+ VIEW  Comparison: None.  Findings: No evidence of fracture or dislocation.  No definite evidence of knee joint effusion.  Moderate osteoarthritis is seen along the medial compartment.  Mild degenerative spurring is also seen along the lateral compartment patella.  IMPRESSION:  1.  No acute findings. 2.  Tricompartmental osteoarthritis, predominately involving the medial compartment.  Original Report Authenticated By: Danae Orleans, M.D.   Dg Knee Complete 4 Views Right  09/27/2011  *RADIOLOGY REPORT*  Clinical Data: Fall.  Knee injury.  Knee pain and swelling.  RIGHT KNEE - COMPLETE 4+ VIEW  Comparison: None.  Findings: No evidence of fracture or dislocation.  Probable small knee joint effusion noted on the lateral projection.  Moderate medial compartment osteoarthritis is seen.  No other significant bone abnormality identified.  IMPRESSION:  1.  No evidence of fracture. 2.  Probable small knee joint effusion. 3.  Moderate medial compartment osteoarthritis.  Original Report Authenticated By: Danae Orleans, M.D.    Review of Systems  HENT: Negative.   Eyes: Negative.   Respiratory: Negative.   Cardiovascular: Negative.   Gastrointestinal: Negative.   Genitourinary: Negative.   Musculoskeletal: Positive for joint pain.  Skin: Negative.   Neurological: Positive for weakness.  Endo/Heme/Allergies: Negative.   Psychiatric/Behavioral: Negative.     Blood pressure 108/49, pulse 67, temperature 97.7 F (36.5 C), temperature source Oral, resp. rate 15, SpO2 99.00%. Physical Exam  Constitutional: She is oriented to person, place,  and time. She appears well-developed and well-nourished. No distress.  HENT:  Head: Normocephalic and  atraumatic.  Eyes: EOM are normal. Pupils are equal, round, and reactive to light.  Neck: Normal range of motion. Neck supple.  Cardiovascular: Normal rate and regular rhythm.  Exam reveals no gallop.   No murmur heard. Respiratory: Effort normal. She has no wheezes. She has rales. She exhibits tenderness.  GI: Soft. Bowel sounds are normal.    Musculoskeletal:       Right knee: tenderness found.       Left knee: tenderness found.  Neurological: She is alert and oriented to person, place, and time. GCS eye subscore is 4. GCS verbal subscore is 5. GCS motor subscore is 6.  Psychiatric: Her speech is normal and behavior is normal. Her mood appears anxious.     Assessment/Plan Fall Chest wall contusion questionable left 4 th and 5 th rib fracture.  I see no cortical disruption on the CT though. Admit for pain control and pulmonary toilet.  Fritzie Prioleau A. 09/27/2011, 2:54 PM

## 2011-09-27 NOTE — ED Notes (Signed)
MD at bedside. 

## 2011-09-28 ENCOUNTER — Observation Stay (HOSPITAL_COMMUNITY): Payer: 59

## 2011-09-28 DIAGNOSIS — W19XXXA Unspecified fall, initial encounter: Secondary | ICD-10-CM | POA: Diagnosis present

## 2011-09-28 DIAGNOSIS — S8000XA Contusion of unspecified knee, initial encounter: Secondary | ICD-10-CM | POA: Diagnosis present

## 2011-09-28 DIAGNOSIS — E669 Obesity, unspecified: Secondary | ICD-10-CM | POA: Insufficient documentation

## 2011-09-28 DIAGNOSIS — S2242XA Multiple fractures of ribs, left side, initial encounter for closed fracture: Secondary | ICD-10-CM | POA: Diagnosis present

## 2011-09-28 MED ORDER — HYDROMORPHONE HCL PF 1 MG/ML IJ SOLN: 0.5000 mg | INTRAMUSCULAR | Status: DC | PRN

## 2011-09-28 MED ORDER — NAPROXEN 500 MG PO TABS
500.0000 mg | ORAL_TABLET | Freq: Two times a day (BID) | ORAL | Status: DC
  Administered 2011-09-28 – 2011-09-29 (×2): 500 mg via ORAL
  Filled 2011-09-28 (×4): qty 1

## 2011-09-28 MED ORDER — ENOXAPARIN SODIUM 30 MG/0.3ML ~~LOC~~ SOLN
30.0000 mg | Freq: Two times a day (BID) | SUBCUTANEOUS | Status: DC
  Administered 2011-09-28 – 2011-09-29 (×2): 30 mg via SUBCUTANEOUS
  Filled 2011-09-28 (×4): qty 0.3

## 2011-09-28 MED ORDER — OXYCODONE HCL 5 MG PO TABS
5.0000 mg | ORAL_TABLET | ORAL | Status: DC | PRN
  Administered 2011-09-28 (×2): 15 mg via ORAL
  Administered 2011-09-29: 10 mg via ORAL
  Filled 2011-09-28: qty 3
  Filled 2011-09-28: qty 2
  Filled 2011-09-28: qty 1
  Filled 2011-09-28: qty 2

## 2011-09-28 NOTE — Progress Notes (Signed)
Clinical Social Worker completed the psychosocial assessment which can be found in the shadow chart.  Patient would like to await PT/OT evaluations before making a commitment about rehab options.  Patient with limited support from husband at home.    Clinical Social Worker has completed SBIRT with patient at bedside.  Patient with no concerns regarding ETOH use.  CSW will continue to follow up with patient and facilitate patient discharge needs if necessary.  [address] [place], [place] [phone]

## 2011-09-28 NOTE — Progress Notes (Signed)
Physical Therapy Evaluation Patient Details Name: Brittany Robertson MRN: [id number] DOB: [birth year] Today's Date: 09/28/2011  Problem List:  Patient Active Problem List  Diagnoses  . Hypertension  . Degenerative disk disease  . Fall  . Multiple fractures of ribs of left side  . Bilateral contusions of knee  . Obesity    Past Medical History:  Past Medical History  Diagnosis Date  . Hypertension   . Degenerative disk disease    Past Surgical History:  Past Surgical History  Procedure Date  . Knee arthroscopy   . Stents     PT Assessment/Plan/Recommendation PT Assessment Clinical Impression Statement: Pt. with decreased mobility secondary to pain from falling on bilateral knees and ribs.  Pt. able to ambulate 200 feet with RW and navigate 2 stairs in order to be able to enter her home.  Pt. states that her husband and daughter will be able to provide 24 hour supervsion upon return to home.  Pt. demonstrated heel slides and straight leg raises that she had been given to do and states that she will continue to do them to work on BLE strength.  Will recommend RW as pt. has a standard walker at home.  PT Recommendation/Assessment: Patent does not need any further PT services No Skilled PT: All education completed;Patient will have necessary level of assist by caregiver at discharge PT Recommendation Follow Up Recommendations: Home health PT;24 hour supervision/assistance Equipment Recommended: Rolling walker with 5" wheels     PT Evaluation Precautions/Restrictions  Precautions Precautions: Fall Prior Functioning  Home Living Lives With: Spouse Type of Home: House Home Layout: One level Home Access: Stairs to enter Entrance Stairs-Rails: Right Entrance Stairs-Number of Steps: 2 Bathroom Shower/Tub: Health visitor: Standard Bathroom Accessibility: Yes How Accessible: Accessible via walker Home Adaptive Equipment: Bedside commode/3-in-1;Shower chair with  back;Straight cane;Walker - standard Prior Function Level of Independence: Independent with basic ADLs;Independent with homemaking with ambulation;Independent with gait;Independent with transfers Driving: Yes Vocation: Retired Financial risk analyst Arousal/Alertness: Awake/alert Overall Cognitive Status: Appears within functional limits for tasks assessed    Extremity Assessment RLE Assessment RLE Assessment: Within Functional Limits LLE Assessment LLE Assessment: Within Functional Limits Mobility (including Balance) Bed Mobility Bed Mobility: Yes Rolling Left: 6: Modified independent (Device/Increase time) Left Sidelying to Sit: 6: Modified independent (Device/Increase time);HOB flat Sitting - Scoot to Edge of Bed: 7: Independent Sit to Supine - Left: 6: Modified independent (Device/Increase time);HOB flat Transfers Transfers: Yes Sit to Stand: 5: Supervision;From chair/3-in-1;From bed Sit to Stand Details (indicate cue type and reason): Supervision for safety.  Stand to Sit: 5: Supervision;To chair/3-in-1;To bed Stand to Sit Details: Supervision for safety.  Ambulation/Gait Ambulation/Gait: Yes Ambulation/Gait Assistance: 6: Modified independent (Device/Increase time) Ambulation Distance (Feet): 200 Feet Assistive device: Rolling walker Gait Pattern: Step-through pattern;Decreased stride length Stairs: Yes Stairs Assistance: 6: Modified independent (Device/Increase time) Stair Management Technique: One rail Right;Other (comment) (Hand held on left) Number of Stairs: 2  Height of Stairs: 8       End of Session PT - End of Session Equipment Utilized During Treatment: Gait belt Activity Tolerance: Patient tolerated treatment well Patient left: in bed;with call bell in reach Nurse Communication: Mobility status for transfers;Mobility status for ambulation General Behavior During Session: [facility] for tasks performed Cognition: [facility] for tasks performed  Laney Pastor, SPT    09/28/2011, 4:57 PM

## 2011-09-28 NOTE — Progress Notes (Signed)
Still quite sore Patient examined and I agree with the assessment and plan  Brittany Robertson E

## 2011-09-28 NOTE — Progress Notes (Signed)
Seen and agree with SPT note Katoya Amato Tabor, PT [phone]  

## 2011-09-28 NOTE — Progress Notes (Signed)
Patient ID: Brittany Robertson, female   DOB: [birth year], 70 y.o.   MRN: [id number]   LOS: 1 day   Subjective: Having bilateral anterior chest wall pain and bilateral knee pain.  Objective: Vital signs in last 24 hours: Temp:  [98.1 F (36.7 C)-99.9 F (37.7 C)] 98.4 F (36.9 C) (12/10 1002) Pulse Rate:  [66-86] 76  (12/10 1002) Resp:  [14-22] 17  (12/10 1002) BP: (99-134)/(49-75) 111/52 mmHg (12/10 1002) SpO2:  [93 %-99 %] 95 % (12/10 1002) Weight:  [106 kg (233 lb 11 oz)] 233 lb 11 oz (106 kg) (12/10 0500) Last BM Date: 09/26/11  *RADIOLOGY REPORT*  Clinical Data: Shortness of breath  PORTABLE CHEST - 1 VIEW  Comparison: 09/27/2011 CT and chest radiograph  Findings: Examination quality is suboptimal due to use of portable  technique. Fine detail is obscured by patient body habitus.  Allowing for this, curvilinear right lower lobe atelectasis is  noted with overall low lung volumes but no new focal pulmonary  otherwise. Heart size is normal. No pleural effusion.  IMPRESSION:  Right lower lobe curvilinear atelectasis. If the patient's symptoms  continue, consider PA and lateral chest radiographs obtained at  full inspiration when the patient is clinically able.  Original Report Authenticated By: Harrel Lemon, M.D.  General appearance: alert and no distress Resp: clear to auscultation bilaterally Cardio: regular rate and rhythm GI: normal findings: bowel sounds normal and soft, non-tender Extremities: Bilateral knee TTP  Assessment/Plan: Fall Multiple left rib fxs -- Pain control and pulmonary toilet Bilateral knee pain Multiple medical problems -- home meds FEN -- D/C PCA, foley VTE -- Lovenox Dispo -- PT/OT   Freeman Caldron, PA-C Pager: [phone] General Trauma PA Pager: [phone]   09/28/2011

## 2011-09-29 MED ORDER — NAPROXEN 500 MG PO TABS: 500.0000 mg | ORAL_TABLET | Freq: Two times a day (BID) | ORAL | Status: AC

## 2011-09-29 MED ORDER — OXYCODONE HCL 5 MG PO TABS: 5.0000 mg | ORAL_TABLET | ORAL | Status: AC | PRN

## 2011-09-29 NOTE — Discharge Summary (Signed)
Agree Riyansh Gerstner E  

## 2011-09-29 NOTE — Progress Notes (Signed)
Discharge instructions reviewed with patient and husband. Questions answered. Medication due times reviewed. Copy of AVS and prescriptions given to patient and husband. Pt discharged to home via wheelchair. Accompanied by husband.

## 2011-09-29 NOTE — Progress Notes (Signed)
   CARE MANAGEMENT NOTE 09/29/2011  Patient:  Brittany Robertson, Brittany Robertson   Account Number:  [id number]  Date Initiated:  09/29/2011  Documentation initiated by:  Carlyle Lipa  Subjective/Objective Assessment:   Fall at church with chest wall contusion and possible rib fractures.      Action/Plan:   Home with husband to assist.   Anticipated DC Date:  09/29/2011   Anticipated DC Plan:  HOME/SELF CARE  In-house referral  Clinical Social Worker      DC Associate Professor  CM consult      [facility] Choice  HOME HEALTH         Status of service:  Completed, signed off Medicare Important Message given?  NA - LOS <3   Discharge Disposition:  HOME/SELF CARE  Per UR Regulation:  Reviewed for med. necessity/level of care/duration of stay  Comments:  09/29/2011 Carlyle Lipa, RN BSN CCM [id number]--Spoke with pt re: home health arrangements. Pt and husband both politely asked if it was required because they felt that they did not need it. Husband explained that he had worked with pt on her PT at home after each of 4 knee surgeries she has had and felt that it was not necessary. Advised that they could contact her PCP for follow up and that office could arrange it after she went home if she changed her mind. Both pt and spouse stated understanding.

## 2011-09-29 NOTE — Progress Notes (Signed)
Occupational Therapy Evaluation Patient Details Name: Brittany Robertson MRN: [id number] DOB: [birth year] Today's Date: 09/29/2011  Problem List:  Patient Active Problem List  Diagnoses  . Hypertension  . Degenerative disk disease  . Fall  . Multiple fractures of ribs of left side  . Bilateral contusions of knee  . Obesity    Past Medical History:  Past Medical History  Diagnosis Date  . Hypertension   . Degenerative disk disease    Past Surgical History:  Past Surgical History  Procedure Date  . Knee arthroscopy   . Stents     OT Assessment/Plan/Recommendation OT Assessment Clinical Impression Statement: Pt. with decreased I with functional transfers secondary to pain from falling on bilateral knees and ribs.  Pt doing very well this morning.  No acute OT needed as education has been completed and pt will have necessary level of assistance at home.   OT Recommendation/Assessment: Patient does not need any further OT services OT Goals    OT Evaluation Precautions/Restrictions  Precautions Precautions: Fall Prior Functioning Home Living Lives With: Spouse Type of Home: House Home Layout: One level Home Access: Stairs to enter Entrance Stairs-Rails: Right Entrance Stairs-Number of Steps: 2 Bathroom Shower/Tub: Health visitor: Standard Bathroom Accessibility: Yes How Accessible: Accessible via walker Home Adaptive Equipment: Bedside commode/3-in-1;Shower chair with back;Straight cane;Walker - standard Prior Function Level of Independence: Independent with basic ADLs;Independent with homemaking with ambulation;Independent with gait;Independent with transfers Driving: Yes Vocation: Retired ADL ADL Eating/Feeding: Performed;Independent Where Assessed - Eating/Feeding: Edge of bed Grooming: Simulated;Independent Where Assessed - Grooming: Sitting, bed;Unsupported Upper Body Bathing: Simulated;Set up Where Assessed - Upper Body Bathing:  Unsupported;Sitting, bed Lower Body Bathing: Simulated;Set up Where Assessed - Lower Body Bathing: Sit to stand from bed;Unsupported Upper Body Dressing: Simulated;Independent Where Assessed - Upper Body Dressing: Sitting, bed;Unsupported Lower Body Dressing: Simulated;Independent Where Assessed - Lower Body Dressing: Sit to stand from bed;Unsupported Toilet Transfer: Simulated;Modified independent Toilet Transfer Method: Ambulating Toileting - Clothing Manipulation: Simulated;Independent Where Assessed - Toileting Clothing Manipulation: Standing Toileting - Hygiene: Simulated;Independent Where Assessed - Toileting Hygiene: Sit to stand from 3-in-1 or toilet Tub/Shower Transfer: Simulated;Modified independent Tub/Shower Transfer Method: Science writer: Walk in Scientist, research (physical sciences) Used: Rolling walker Vision/Perception    Cognition Cognition Arousal/Alertness: Awake/alert Overall Cognitive Status: Appears within functional limits for tasks assessed Sensation/Coordination   Extremity Assessment RUE Assessment RUE Assessment: Within Functional Limits LUE Assessment LUE Assessment: Within Functional Limits Mobility  Bed Mobility Bed Mobility: No Transfers Transfers: Yes Sit to Stand: 7: Independent;From bed;With upper extremity assist Stand to Sit: 7: Independent;With upper extremity assist;To bed Exercises   End of Session OT - End of Session Activity Tolerance: Patient tolerated treatment well Patient left: in bed General Behavior During Session: [facility] for tasks performed Cognition: [facility] for tasks performed   Cipriano Mile 09/29/2011, 9:32 AM  09/29/2011 Cipriano Mile OTR/L Pager [phone] Office [phone]

## 2011-09-29 NOTE — Progress Notes (Signed)
Patient ID: Brittany Robertson, female   DOB: 1941/08/05, 70 y.o.   MRN: [id number]   LOS: 2 days   Subjective: Doing much better today , up ambulating with walker and feels ready for DC to home.  Objective: Vital signs in last 24 hours: Temp:  [98.3 F (36.8 C)-98.5 F (36.9 C)] 98.4 F (36.9 C) (12/11 0652) Pulse Rate:  [68-81] 70  (12/11 0652) Resp:  [16-19] 19  (12/11 0652) BP: (101-124)/(50-80) 107/80 mmHg (12/11 0652) SpO2:  [95 %-100 %] 100 % (12/11 0652) Last BM Date: 09/26/11   General appearance: alert and no distress Resp: clear to auscultation bilaterally Cardio: regular rate and rhythm GI: normal findings: bowel sounds normal and soft, non-tender Extremities: Bilateral knees with minimal ecchymosis, but improving ROM and decreased pain with ambulation  Assessment/Plan: Fall Multiple left rib fxs -- Dc home with meds for pain and IS Bilateral knee pain- improving Multiple medical problems -- home meds FEN --no issues VTE -- Lovenox while hosiptalized Dispo -- Ready for DC to home.   Franki Monte Pager [phone] General Trauma Pager [phone]   09/29/2011

## 2011-09-29 NOTE — Progress Notes (Signed)
Clinical Social Worker spoke with patient and patient husband at bedside to confirm patient plans at discharge.  Patient and patient husband state they have all the necessary equipment at home.  Patient to return home with assistance from husband and home health PT.    Clinical Social Worker will sign off for now as social work intervention is no longer needed. Please consult [place] again if new need arises.  [address] [place], [place] [phone]

## 2011-09-29 NOTE — Discharge Summary (Signed)
Physician Discharge Summary  Patient ID: Brittany Robertson MRN: [id number] DOB/AGE: 01-25-1941 70 y.o.  Admit date: 09/27/2011 Discharge date: 09/29/2011  Admission Diagnoses: Fall with chest wall contusions, possible non displaced rib fractures  Discharge Diagnoses:  Active Problems:  Fall  Multiple fractures of ribs of left side  Bilateral contusions of knee   Discharged Condition: good  Hospital Course: Pt fell at church today. Fell on chest . Complaining of chest pain. Severe especially with a deep breath. On the left side. Complaining of bilateral knee pain. Work up in the ED revealed mild deformity of the left 4th and 5th ribs and chest wall contusions. The patient was unable to mobilize well and having significant pain control issues and was admitted for pain control, pulmonary toilet and mobilization with PT.  She has done well with PT and is ambulatory with a rolling walker and her pain is controlled with po medications and is ready for DC home with her husband.  Consults: none  Significant Diagnostic Studies: radiology: CXR: 12/9- right lower lobe atelectasis     Chest CT-IMPRESSION: Mild deformity of the left anterolateral 4th and 5th ribs,         correlate with the site of the patient's pain for acute fracture.         Otherwise, no evidence of acute traumatic injury to the chest.    Treatments: therapies: PT and OT  Discharge Exam: Blood pressure 107/80, pulse 70, temperature 98.4 F (36.9 C), temperature source Oral, resp. rate 19, height 5\' 1"  (1.549 m), weight 106 kg (233 lb 11 oz), SpO2 100.00%. General appearance: alert, cooperative, appears stated age and no distress Resp: clear to auscultation bilaterally Chest wall: left sided chest wall tenderness Cardio: regular rate and rhythm GI: soft, non-tender; bowel sounds normal; no masses,  no organomegaly  Disposition: DC to home with husband and Home Health PT  Current Discharge Medication List    START  taking these medications   Details  naproxen (NAPROSYN) 500 MG tablet Take 1 tablet (500 mg total) by mouth 2 (two) times daily with a meal. Qty: 28 tablet, Refills: 0    oxyCODONE (OXY IR/ROXICODONE) 5 MG immediate release tablet Take 1-3 tablets (5-15 mg total) by mouth every 4 (four) hours as needed (5mg  for mild pain, 10mg  for moderate pain, 15mg  for severe pain). Qty: 90 tablet, Refills: 0      CONTINUE these medications which have NOT CHANGED   Details  amLODipine (NORVASC) 5 MG tablet Take 5 mg by mouth every evening.      aspirin 81 MG chewable tablet Chew 81 mg by mouth every evening.      atorvastatin (LIPITOR) 20 MG tablet Take 20 mg by mouth every evening.      Calcium Carbonate-Vitamin D (CALCIUM-VITAMIN D) 500-200 MG-UNIT per tablet Take 1 tablet by mouth 2 (two) times daily with a meal. Calcium/vitamin d 600mg /400iu twice daily on Sunday, Tuesday, Thursday, and Saturday     cetirizine (ZYRTEC) 10 MG tablet Take 10 mg by mouth every morning.      cholecalciferol (VITAMIN D) 400 UNITS TABS Take 400 Units by mouth 2 (two) times daily. Monday, Wednesday, and Friday     docusate sodium (COLACE) 100 MG capsule Take 100 mg by mouth every evening.      esomeprazole (NEXIUM) 40 MG capsule Take 40 mg by mouth daily before breakfast.      ezetimibe (ZETIA) 10 MG tablet Take 10 mg by mouth every evening.  Fesoterodine Fumarate (TOVIAZ) 8 MG TB24 Take 8 mg by mouth every morning.      fish oil-omega-3 fatty acids 1000 MG capsule Take 1 g by mouth 2 (two) times daily.      glucosamine-chondroitin 500-400 MG tablet Take 1 tablet by mouth 2 (two) times daily.      isosorbide mononitrate (IMDUR) 30 MG 24 hr tablet Take 30 mg by mouth every morning.      meloxicam (MOBIC) 15 MG tablet Take 15 mg by mouth every evening.      metoprolol succinate (TOPROL-XL) 25 MG 24 hr tablet Take 12.5 mg by mouth every morning.      potassium chloride SA (K-DUR,KLOR-CON) 20 MEQ tablet Take  20 mEq by mouth every morning.      telmisartan-hydrochlorothiazide (MICARDIS HCT) 40-12.5 MG per tablet Take 1 tablet by mouth every morning.      vitamin C (ASCORBIC ACID) 500 MG tablet Take 500 mg by mouth 2 (two) times daily.      vitamin E 400 UNIT capsule Take 400 Units by mouth 2 (two) times daily.      azelastine (ASTELIN) 137 MCG/SPRAY nasal spray Place 1 spray into the nose 2 (two) times daily as needed. Allergies.     hyoscyamine (LEVSIN SL) 0.125 MG SL tablet Place 0.125 mg under the tongue every 4 (four) hours as needed. Nausea      nitroGLYCERIN (NITROSTAT) 0.4 MG SL tablet Place 0.4 mg under the tongue every 5 (five) minutes as needed. As needed for chest pain x 3 doses     sodium chloride (OCEAN) 0.65 % nasal spray Place 1 spray into the nose as needed. Allergies           Signed: [name] 09/29/2011, 9:44 AM

## 2011-09-29 NOTE — Progress Notes (Signed)
Agree Brittany Robertson E  

## 2011-11-02 ENCOUNTER — Other Ambulatory Visit: Payer: Self-pay | Admitting: Family Medicine

## 2011-11-02 DIAGNOSIS — Z1231 Encounter for screening mammogram for malignant neoplasm of breast: Secondary | ICD-10-CM

## 2011-12-08 DIAGNOSIS — H04209 Unspecified epiphora, unspecified lacrimal gland: Secondary | ICD-10-CM | POA: Insufficient documentation

## 2011-12-10 ENCOUNTER — Ambulatory Visit
Admission: RE | Admit: 2011-12-10 | Discharge: 2011-12-10 | Disposition: A | Discharge status: Home / Self Care | Payer: Medicare Other | Source: Ambulatory Visit | Attending: Family Medicine | Admitting: Family Medicine

## 2011-12-10 DIAGNOSIS — Z1231 Encounter for screening mammogram for malignant neoplasm of breast: Secondary | ICD-10-CM

## 2012-01-18 HISTORY — PX: FOOT SURGERY: SHX648

## 2012-07-06 DIAGNOSIS — T8529XA Other mechanical complication of intraocular lens, initial encounter: Secondary | ICD-10-CM | POA: Insufficient documentation

## 2012-07-06 DIAGNOSIS — Z961 Presence of intraocular lens: Secondary | ICD-10-CM | POA: Insufficient documentation

## 2012-11-07 ENCOUNTER — Other Ambulatory Visit: Payer: Self-pay | Admitting: Family Medicine

## 2012-11-07 DIAGNOSIS — Z1231 Encounter for screening mammogram for malignant neoplasm of breast: Secondary | ICD-10-CM

## 2012-12-13 ENCOUNTER — Ambulatory Visit
Admission: RE | Admit: 2012-12-13 | Discharge: 2012-12-13 | Disposition: A | Discharge status: Home / Self Care | Payer: 59 | Source: Ambulatory Visit | Attending: Family Medicine | Admitting: Family Medicine

## 2012-12-13 DIAGNOSIS — Z1231 Encounter for screening mammogram for malignant neoplasm of breast: Secondary | ICD-10-CM

## 2013-08-07 ENCOUNTER — Telehealth: Payer: Self-pay | Admitting: Cardiology

## 2013-08-07 DIAGNOSIS — R0602 Shortness of breath: Secondary | ICD-10-CM

## 2013-08-07 NOTE — Telephone Encounter (Signed)
To Dr. Mayford Knife to advise. I printed pts last visit with PCP today

## 2013-08-07 NOTE — Telephone Encounter (Signed)
Agree with plan 

## 2013-08-07 NOTE — Telephone Encounter (Signed)
Spoke with Dr. Mayford Knife. Told Triage to make pt aware that she should go and see her PCP today due to SOB. To Dr. Mayford Knife to agree.

## 2013-08-07 NOTE — Telephone Encounter (Signed)
Patient is having SOB and has not had a nuclear stress test since 2012.  Please get a 2D echo and Lexiscan myoview to assess for ischemia

## 2013-08-07 NOTE — Telephone Encounter (Signed)
New Problem  Pt called and states that she is experiencing SOB while walking her Dog or simply being active.. Pt is requesting a same day appt. Please assist.

## 2013-08-07 NOTE — Telephone Encounter (Signed)
New Problem:  Pt was referred to [place] from [facility] for dyspnea on exertion. Pt wants to know if she can see Dr. Mayford Knife before 10/28. Please advise

## 2013-08-08 NOTE — Telephone Encounter (Signed)
Pt aware and ordered for pt. Paper work up front with Bonita Quin for pt.

## 2013-08-09 ENCOUNTER — Ambulatory Visit (HOSPITAL_COMMUNITY): Payer: 59 | Attending: Cardiology

## 2013-08-09 DIAGNOSIS — I359 Nonrheumatic aortic valve disorder, unspecified: Secondary | ICD-10-CM | POA: Insufficient documentation

## 2013-08-09 DIAGNOSIS — R079 Chest pain, unspecified: Secondary | ICD-10-CM | POA: Diagnosis not present

## 2013-08-09 DIAGNOSIS — I059 Rheumatic mitral valve disease, unspecified: Secondary | ICD-10-CM | POA: Insufficient documentation

## 2013-08-09 DIAGNOSIS — R0989 Other specified symptoms and signs involving the circulatory and respiratory systems: Secondary | ICD-10-CM | POA: Insufficient documentation

## 2013-08-09 DIAGNOSIS — R0609 Other forms of dyspnea: Secondary | ICD-10-CM | POA: Insufficient documentation

## 2013-08-09 DIAGNOSIS — R0602 Shortness of breath: Secondary | ICD-10-CM

## 2013-08-09 DIAGNOSIS — I10 Essential (primary) hypertension: Secondary | ICD-10-CM | POA: Insufficient documentation

## 2013-08-09 NOTE — Progress Notes (Signed)
Echocardiogram performed.  

## 2013-08-14 ENCOUNTER — Encounter: Payer: Self-pay | Admitting: *Deleted

## 2013-08-14 ENCOUNTER — Encounter: Payer: Self-pay | Admitting: Cardiology

## 2013-08-15 ENCOUNTER — Ambulatory Visit (INDEPENDENT_AMBULATORY_CARE_PROVIDER_SITE_OTHER): Payer: 59 | Admitting: Cardiology

## 2013-08-15 ENCOUNTER — Encounter: Payer: Self-pay | Admitting: Cardiology

## 2013-08-15 VITALS — BP 142/82 | HR 72 | Ht 64.0 in | Wt 222.0 lb

## 2013-08-15 DIAGNOSIS — R079 Chest pain, unspecified: Secondary | ICD-10-CM

## 2013-08-15 DIAGNOSIS — E785 Hyperlipidemia, unspecified: Secondary | ICD-10-CM | POA: Insufficient documentation

## 2013-08-15 DIAGNOSIS — R0600 Dyspnea, unspecified: Secondary | ICD-10-CM | POA: Insufficient documentation

## 2013-08-15 DIAGNOSIS — E669 Obesity, unspecified: Secondary | ICD-10-CM

## 2013-08-15 DIAGNOSIS — I493 Ventricular premature depolarization: Secondary | ICD-10-CM | POA: Insufficient documentation

## 2013-08-15 DIAGNOSIS — I10 Essential (primary) hypertension: Secondary | ICD-10-CM

## 2013-08-15 DIAGNOSIS — R0609 Other forms of dyspnea: Secondary | ICD-10-CM

## 2013-08-15 NOTE — Progress Notes (Signed)
[address] [place], [place]  [address] Phone: [phone] Fax:  [phone]  Date:  08/15/2013   ID:  Brittany Robertson, DOB 1941-08-17, MRN [id number]  PCP:  Lupita Raider, MD  Cardiologist: Armanda Magic, MD     History of Present Illness: Brittany Robertson is a 72 y.o. female with a history ofd CAD/obesity/HTN/dyslipidemia who recently saw her PCP for DOE.  This has been going on for several weeks.  Apparently her husband has noticed it but she is not aware of it.  She denies any LE edema, dizziness but has noticed some intermittent chest tightness which is sporadic and occurs when she goes to do something and she will get SOB and then her chest will tighten.  She has not taken any NTG for it.  She told her PCP that her symptoms are identical to what she had at time of her CAD in 2006.  CBC and TSH done by PCP were normal.  2D echo showed normal LVF with mild MR.  She occasionally notices a skipped heart beat.   Wt Readings from Last 3 Encounters:  08/15/13 222 lb (100.699 kg)  09/28/11 233 lb 11 oz (106 kg)     Past Medical History  Diagnosis Date  . Hypertension   . Degenerative disk disease   . Obesity   . Multiple fractures of ribs of left side   . Bilateral contusions of knee   . Coronary artery disease     pci 2006, repeat cath 30% mid LAD distal to stent otherwise normal coronary arteries  . Hyperlipidemia   . GERD (gastroesophageal reflux disease)   . OA (osteoarthritis)   . Osteopenia   . PVC's (premature ventricular contractions)     Current Outpatient Prescriptions  Medication Sig Dispense Refill  . aspirin 81 MG chewable tablet Chew 81 mg by mouth every evening.        Marland Kitchen azelastine (ASTELIN) 137 MCG/SPRAY nasal spray Place 1 spray into the nose 2 (two) times daily as needed. Allergies.       . Calcium Carbonate-Vitamin D (CALCIUM-VITAMIN D) 500-200 MG-UNIT per tablet Take 1 tablet by mouth 2 (two) times daily with a meal. Calcium/vitamin d  600mg /400iu twice daily on Sunday, Tuesday, Thursday, and Saturday       . cetirizine (ZYRTEC) 10 MG tablet Take 10 mg by mouth every morning.        . cholecalciferol (VITAMIN D) 400 UNITS TABS Take 400 Units by mouth 2 (two) times daily. Monday, Wednesday, and Friday       . docusate sodium (COLACE) 100 MG capsule Take 100 mg by mouth every evening.        Marland Kitchen esomeprazole (NEXIUM) 40 MG capsule Take 40 mg by mouth daily before breakfast.        . ezetimibe (ZETIA) 10 MG tablet Take 10 mg by mouth every evening.        Marland Kitchen Fesoterodine Fumarate (TOVIAZ) 8 MG TB24 Take 8 mg by mouth every morning.        . fish oil-omega-3 fatty acids 1000 MG capsule Take 1 g by mouth 2 (two) times daily.        Marland Kitchen glucosamine-chondroitin 500-400 MG tablet Take 1 tablet by mouth 2 (two) times daily.        . hyoscyamine (LEVSIN SL) 0.125 MG SL tablet Place 0.125 mg under the tongue every 4 (four) hours as needed. Nausea        . isosorbide  mononitrate (IMDUR) 30 MG 24 hr tablet Take 30 mg by mouth every morning.        . meloxicam (MOBIC) 15 MG tablet Take 15 mg by mouth every evening.        . metoprolol succinate (TOPROL-XL) 25 MG 24 hr tablet Take 12.5 mg by mouth every morning.        . nitroGLYCERIN (NITROSTAT) 0.4 MG SL tablet Place 0.4 mg under the tongue every 5 (five) minutes as needed. As needed for chest pain x 3 doses       . potassium chloride SA (K-DUR,KLOR-CON) 20 MEQ tablet Take 20 mEq by mouth every morning.        . sodium chloride (OCEAN) 0.65 % nasal spray Place 1 spray into the nose as needed. Allergies        . vitamin C (ASCORBIC ACID) 500 MG tablet Take 500 mg by mouth 2 (two) times daily.        . vitamin E 400 UNIT capsule Take 400 Units by mouth 2 (two) times daily.         No current facility-administered medications for this visit.    Allergies:    Allergies  Allergen Reactions  . Adhesive [Tape] Other (See Comments)    irritation  . Codeine Nausea And Vomiting  . Sulfa  Antibiotics Hives and Itching  . Penicillins Itching and Rash    Social History:  The patient  reports that she has never smoked. She does not have any smokeless tobacco history on file. She reports that she drinks about 0.6 ounces of alcohol per week. She reports that she does not use illicit drugs.   Family History:  The patient's family history is not on file.   ROS:  Please see the history of present illness.      All other systems reviewed and negative.   PHYSICAL EXAM: VS:  BP 142/82  Pulse 72  Ht 5\' 4"  (1.626 m)  Wt 222 lb (100.699 kg)  BMI 38.09 kg/m2 Well nourished, well developed, in no acute distress HEENT: normal Neck: no JVD Cardiac:  normal S1, S2; RRR; no murmur Lungs:  clear to auscultation bilaterally, no wheezing, rhonchi or rales Abd: soft, nontender, no hepatomegaly Ext: no edema Skin: warm and dry Neuro:  CNs 2-12 intact, no focal abnormalities noted     EKG;  NSR with no ST changes  ASSESSMENT AND PLAN:  1. DOE 2. Chest tightness  - 2 Day Lexiscan myoview to rule out ischemia 3. CAD with most recent cath in 2011 showing patent LAD stent with 30% stenosis distal to the stent  - continue ASA/Imdur 4. HTN  - continue Toprol/Benicar 5. Dyslipidemia  - continue Zetia/fish oil/Lipitor 6. Obesity  Followup at scheduled appt 09/25/2013  Signed, Armanda Magic, MD 08/15/2013 4:26 PM

## 2013-08-24 ENCOUNTER — Ambulatory Visit (HOSPITAL_COMMUNITY): Payer: 59 | Attending: Cardiology | Admitting: Radiology

## 2013-08-24 VITALS — BP 136/78 | Ht 64.0 in | Wt 222.0 lb

## 2013-08-24 DIAGNOSIS — I10 Essential (primary) hypertension: Secondary | ICD-10-CM | POA: Insufficient documentation

## 2013-08-24 DIAGNOSIS — E785 Hyperlipidemia, unspecified: Secondary | ICD-10-CM | POA: Insufficient documentation

## 2013-08-24 DIAGNOSIS — R0602 Shortness of breath: Secondary | ICD-10-CM

## 2013-08-24 DIAGNOSIS — Z8249 Family history of ischemic heart disease and other diseases of the circulatory system: Secondary | ICD-10-CM | POA: Insufficient documentation

## 2013-08-24 DIAGNOSIS — R0989 Other specified symptoms and signs involving the circulatory and respiratory systems: Secondary | ICD-10-CM | POA: Insufficient documentation

## 2013-08-24 DIAGNOSIS — R079 Chest pain, unspecified: Secondary | ICD-10-CM | POA: Insufficient documentation

## 2013-08-24 DIAGNOSIS — R42 Dizziness and giddiness: Secondary | ICD-10-CM | POA: Insufficient documentation

## 2013-08-24 DIAGNOSIS — R0609 Other forms of dyspnea: Secondary | ICD-10-CM | POA: Insufficient documentation

## 2013-08-24 DIAGNOSIS — I2510 Atherosclerotic heart disease of native coronary artery without angina pectoris: Secondary | ICD-10-CM

## 2013-08-24 MED ORDER — TECHNETIUM TC 99M SESTAMIBI GENERIC - CARDIOLITE
33.0000 | Freq: Once | INTRAVENOUS | Status: AC | PRN
  Administered 2013-08-24: 33 via INTRAVENOUS

## 2013-08-24 MED ORDER — AMINOPHYLLINE 25 MG/ML IV SOLN
75.0000 mg | Freq: Once | INTRAVENOUS | Status: AC
  Administered 2013-08-24: 75 mg via INTRAVENOUS

## 2013-08-24 MED ORDER — REGADENOSON 0.4 MG/5ML IV SOLN
0.4000 mg | Freq: Once | INTRAVENOUS | Status: AC
  Administered 2013-08-24: 0.4 mg via INTRAVENOUS

## 2013-08-24 NOTE — Progress Notes (Signed)
[facility] SITE 3 NUCLEAR MED [address] [place], [place] [address] [phone]    Cardiology Nuclear Med Study  Brittany Robertson is a 72 y.o. female     MRN : [id number]     DOB: Feb 26, 1941  Procedure Date: 08/24/2013  Nuclear Med Background Indication for Stress Test:  Evaluation for Ischemia and Stent Patency History:  2012 Echo EF 69%, MPI EF 67%, Normal, Hx CAD,Stent Cardiac Risk Factors: Family History - CAD, Hypertension and Lipids  Symptoms:  Chest Pain, Dizziness and DOE   Nuclear Pre-Procedure Caffeine/Decaff Intake:  None > 1 2hrs NPO After: 6:30pm   Lungs:  clear O2 Sat: 97% on room air. IV 0.9% NS with Angio Cath:  22g  IV Site: R Antecubital x 1, tolerated well IV Started by:  Irean Hong, RN  Chest Size (in):  44 Cup Size: C  Height: 5\' 4"  (1.626 m)  Weight:  222 lb (100.699 kg)  BMI:  Body mass index is 38.09 kg/(m^2). Tech Comments:  Toprol this am    Nuclear Med Study 1 or 2 day study: 2 day  Stress Test Type:  Lexiscan  Reading MD: Armanda Magic, MD  Order Authorizing Provider:  Armanda Magic, MD  Resting Radionuclide: Technetium 86m Sestamibi  Resting Radionuclide Dose: 32.1 mCi on 08/29/13   Stress Radionuclide:  Technetium 36m Sestamibi  Stress Radionuclide Dose: 33.0 mCi on 08/24/13           Stress Protocol Rest HR: 60 Stress HR: 90  Rest BP: 136/78 Stress BP: 141/62  Exercise Time (min): n/a METS: n/a   Predicted Max HR: 146 bpm % Max HR: 61.64 bpm Rate Pressure Product: 46962   Dose of Adenosine (mg):  n/a Dose of Lexiscan: 0.4 mg  Dose of Atropine (mg): n/a Dose of Dobutamine: n/a mcg/kg/min (at max HR)  Stress Test Technologist: Bonnita Levan, RN  Nuclear Technologist:  Dario Guardian, CNMT     Rest Procedure:  Myocardial perfusion imaging was performed at rest 45 minutes following the intravenous administration of Technetium 81m Sestamibi. Rest ECG: NSR - Normal EKG  Stress Procedure:  The patient received IV Lexiscan 0.4  mg over 15-seconds.  Technetium 62m Sestamibi injected at 30-seconds.  Quantitative spect images were obtained after a 45 minute delay. Stress ECG: No significant change from baseline ECG  QPS Raw Data Images:  Mild diaphragmatic attenuation.  Increased gut uptake.  Normal left ventricular size. Stress Images:  There is decreased uptake in the inferoapical wall. Rest Images:  There is decreased uptake in the inferoapical wall. Subtraction (SDS):  There is a fixed inferoapical defect that is most consistent with diaphragmatic attenuation and increased gut uptake. Transient Ischemic Dilatation (Normal <1.22):  1.06 Lung/Heart Ratio (Normal <0.45):  0.35  Quantitative Gated Spect Images QGS EDV:  94 ml QGS ESV:  49 ml  Impression Exercise Capacity:  Lexiscan with no exercise. BP Response:  Normal blood pressure response. Clinical Symptoms:  Mild chest pain/dyspnea. ECG Impression:  No significant ECG changes with Lexiscan. Comparison with Prior Nuclear Study: No images to compare  Overall Impression:  Low risk stress nuclear study fixed defect in the inferoapex most c/w diaphragmatic attenuation.  LV Ejection Fraction: 47%.  LV Wall Motion:  NL LV Function; NL Wall Motion  Signed: Armanda Magic, MD 08/30/2013

## 2013-08-29 ENCOUNTER — Ambulatory Visit (HOSPITAL_COMMUNITY): Payer: 59 | Attending: Cardiology

## 2013-08-29 ENCOUNTER — Encounter: Payer: Self-pay | Admitting: Cardiology

## 2013-08-29 DIAGNOSIS — R0989 Other specified symptoms and signs involving the circulatory and respiratory systems: Secondary | ICD-10-CM

## 2013-08-29 MED ORDER — TECHNETIUM TC 99M SESTAMIBI GENERIC - CARDIOLITE
33.0000 | Freq: Once | INTRAVENOUS | Status: AC | PRN
  Administered 2013-08-29: 33 via INTRAVENOUS

## 2013-09-07 ENCOUNTER — Telehealth: Payer: Self-pay | Admitting: Cardiology

## 2013-09-07 ENCOUNTER — Encounter: Payer: Self-pay | Admitting: General Surgery

## 2013-09-07 ENCOUNTER — Other Ambulatory Visit: Payer: Self-pay | Admitting: General Surgery

## 2013-09-07 DIAGNOSIS — R0602 Shortness of breath: Secondary | ICD-10-CM

## 2013-09-07 NOTE — Telephone Encounter (Signed)
Spoke with pt and made aware cath will be done on 09/19/13. She will be coming in for her lab work up on 09/18/13. I gave her Cath instructions letter to her husband who was here today. Pt is aware I would be doing that. To Dr Mayford Knife so she can put Cath order in for pt.

## 2013-09-07 NOTE — Telephone Encounter (Signed)
Patient came in with her husband for his OV last week and stated that she was having SOB almost daily which was limiting her activity.  I have recommended proceeding with cardiac cath to further evaluate for progression of ASCAD

## 2013-09-07 NOTE — Telephone Encounter (Signed)
New Probl      Pt need a call back about the Cath she should be having please.  Her husband came into today saying she needs this information.

## 2013-09-08 ENCOUNTER — Encounter (HOSPITAL_COMMUNITY): Payer: Self-pay | Admitting: Pharmacy Technician

## 2013-09-11 ENCOUNTER — Other Ambulatory Visit: Payer: 59

## 2013-09-18 ENCOUNTER — Other Ambulatory Visit (INDEPENDENT_AMBULATORY_CARE_PROVIDER_SITE_OTHER): Payer: 59

## 2013-09-18 DIAGNOSIS — R0602 Shortness of breath: Secondary | ICD-10-CM

## 2013-09-18 LAB — BASIC METABOLIC PANEL
BUN: 23 mg/dL (ref 6–23)
CO2: 27 mEq/L (ref 19–32)
Calcium: 8.9 mg/dL (ref 8.4–10.5)
Creatinine, Ser: 1 mg/dL (ref 0.4–1.2)
GFR: 59.59 mL/min — ABNORMAL LOW (ref >60.00)
Glucose, Bld: 147 mg/dL — ABNORMAL HIGH (ref 70–99)

## 2013-09-18 LAB — CBC WITH DIFFERENTIAL/PLATELET
Basophils Absolute: 0 10*3/uL (ref 0.0–0.1)
Eosinophils Absolute: 0.2 10*3/uL (ref 0.0–0.7)
Eosinophils Relative: 3.5 % (ref 0.0–5.0)
HCT: 39.2 % (ref 36.0–46.0)
Lymphs Abs: 1.5 10*3/uL (ref 0.7–4.0)
MCHC: 33.1 g/dL (ref 30.0–36.0)
MCV: 96.8 fl (ref 78.0–100.0)
Monocytes Absolute: 0.6 10*3/uL (ref 0.1–1.0)
Neutrophils Relative %: 58.8 % (ref 43.0–77.0)
Platelets: 191 10*3/uL (ref 150.0–400.0)
RDW: 15.4 % — ABNORMAL HIGH (ref 11.5–14.6)

## 2013-09-19 ENCOUNTER — Encounter (HOSPITAL_COMMUNITY)
Admission: RE | Disposition: A | Discharge status: Home / Self Care | Payer: Self-pay | Source: Ambulatory Visit | Attending: Cardiology

## 2013-09-19 ENCOUNTER — Ambulatory Visit (HOSPITAL_COMMUNITY)
Admission: RE | Admit: 2013-09-19 | Discharge: 2013-09-19 | Disposition: A | Discharge status: Home / Self Care | Payer: 59 | Source: Ambulatory Visit | Attending: Cardiology | Admitting: Cardiology

## 2013-09-19 ENCOUNTER — Telehealth: Payer: Self-pay | Admitting: Cardiology

## 2013-09-19 DIAGNOSIS — I10 Essential (primary) hypertension: Secondary | ICD-10-CM | POA: Insufficient documentation

## 2013-09-19 DIAGNOSIS — Z9861 Coronary angioplasty status: Secondary | ICD-10-CM | POA: Insufficient documentation

## 2013-09-19 DIAGNOSIS — I519 Heart disease, unspecified: Secondary | ICD-10-CM | POA: Insufficient documentation

## 2013-09-19 DIAGNOSIS — R0609 Other forms of dyspnea: Secondary | ICD-10-CM

## 2013-09-19 DIAGNOSIS — I2510 Atherosclerotic heart disease of native coronary artery without angina pectoris: Secondary | ICD-10-CM | POA: Insufficient documentation

## 2013-09-19 DIAGNOSIS — E785 Hyperlipidemia, unspecified: Secondary | ICD-10-CM | POA: Insufficient documentation

## 2013-09-19 DIAGNOSIS — Z7982 Long term (current) use of aspirin: Secondary | ICD-10-CM | POA: Insufficient documentation

## 2013-09-19 DIAGNOSIS — R079 Chest pain, unspecified: Secondary | ICD-10-CM | POA: Insufficient documentation

## 2013-09-19 DIAGNOSIS — I5189 Other ill-defined heart diseases: Secondary | ICD-10-CM

## 2013-09-19 DIAGNOSIS — R0602 Shortness of breath: Secondary | ICD-10-CM | POA: Insufficient documentation

## 2013-09-19 DIAGNOSIS — E669 Obesity, unspecified: Secondary | ICD-10-CM | POA: Insufficient documentation

## 2013-09-19 HISTORY — PX: LEFT HEART CATHETERIZATION WITH CORONARY ANGIOGRAM: SHX5451

## 2013-09-19 SURGERY — LEFT HEART CATHETERIZATION WITH CORONARY ANGIOGRAM
Anesthesia: LOCAL

## 2013-09-19 MED ORDER — MIDAZOLAM HCL 2 MG/2ML IJ SOLN
INTRAMUSCULAR | Status: AC
  Filled 2013-09-19: qty 2

## 2013-09-19 MED ORDER — SODIUM CHLORIDE 0.9 % IV SOLN
INTRAVENOUS | Status: DC
  Administered 2013-09-19: 07:00:00 via INTRAVENOUS

## 2013-09-19 MED ORDER — ASPIRIN 81 MG PO CHEW
81.0000 mg | CHEWABLE_TABLET | ORAL | Status: AC
  Administered 2013-09-19: 81 mg via ORAL
  Filled 2013-09-19: qty 1

## 2013-09-19 MED ORDER — FUROSEMIDE 20 MG PO TABS: 20.0000 mg | ORAL_TABLET | Freq: Every day | ORAL | Status: DC

## 2013-09-19 MED ORDER — NITROGLYCERIN 0.2 MG/ML ON CALL CATH LAB
INTRAVENOUS | Status: AC
  Filled 2013-09-19: qty 1

## 2013-09-19 MED ORDER — SODIUM CHLORIDE 0.9 % IV SOLN: 1.0000 mL/kg/h | INTRAVENOUS | Status: DC

## 2013-09-19 MED ORDER — HEPARIN (PORCINE) IN NACL 2-0.9 UNIT/ML-% IJ SOLN
INTRAMUSCULAR | Status: AC
  Filled 2013-09-19: qty 1000

## 2013-09-19 MED ORDER — LIDOCAINE HCL (PF) 1 % IJ SOLN
INTRAMUSCULAR | Status: AC
  Filled 2013-09-19: qty 30

## 2013-09-19 MED ORDER — SODIUM CHLORIDE 0.9 % IJ SOLN: 3.0000 mL | Freq: Two times a day (BID) | INTRAMUSCULAR | Status: DC

## 2013-09-19 MED ORDER — SODIUM CHLORIDE 0.9 % IJ SOLN: 3.0000 mL | INTRAMUSCULAR | Status: DC | PRN

## 2013-09-19 MED ORDER — ACETAMINOPHEN 325 MG PO TABS: 650.0000 mg | ORAL_TABLET | ORAL | Status: DC | PRN

## 2013-09-19 MED ORDER — ONDANSETRON HCL 4 MG/2ML IJ SOLN: 4.0000 mg | Freq: Four times a day (QID) | INTRAMUSCULAR | Status: DC | PRN

## 2013-09-19 MED ORDER — SODIUM CHLORIDE 0.9 % IV SOLN: 250.0000 mL | INTRAVENOUS | Status: DC | PRN

## 2013-09-19 MED ORDER — FENTANYL CITRATE 0.05 MG/ML IJ SOLN
INTRAMUSCULAR | Status: AC
  Filled 2013-09-19: qty 2

## 2013-09-19 NOTE — Interval H&P Note (Signed)
Cath Lab Visit (complete for each Cath Lab visit)  Clinical Evaluation Leading to the Procedure:   ACS: no  Non-ACS:    Anginal Classification: CCS III  Anti-ischemic medical therapy: Maximal Therapy (2 or more classes of medications)  Non-Invasive Test Results: No non-invasive testing performed  Prior CABG: No previous CABG      History and Physical Interval Note:  09/19/2013 9:44 AM  Brittany Robertson  has presented today for surgery, with the diagnosis of Chest pain  The various methods of treatment have been discussed with the patient and family. After consideration of risks, benefits and other options for treatment, the patient has consented to  Procedure(s): LEFT HEART CATHETERIZATION WITH CORONARY ANGIOGRAM (N/A) as a surgical intervention .  The patient's history has been reviewed, patient examined, no change in status, stable for surgery.  I have reviewed the patient's chart and labs.  Questions were answered to the patient's satisfaction.     TURNER,TRACI R

## 2013-09-19 NOTE — H&P (Signed)
ID: Brittany Robertson, DOB [birth year], MRN [id number]  PCP: Lupita Raider, MD  Cardiologist: Armanda Magic, MD  History of Present Illness:  Brittany Robertson is a 72 y.o. female with a history ofd CAD/obesity/HTN/dyslipidemia who recently saw her PCP for DOE. This has been going on for several weeks. Apparently her husband has noticed it but she is not aware of it. She denies any LE edema, dizziness but has noticed some intermittent chest tightness which is sporadic and occurs when she goes to do something and she will get SOB and then her chest will tighten. She has not taken any NTG for it. She told her PCP that her symptoms are identical to what she had at time of her CAD in 2006. CBC and TSH done by PCP were normal. 2D echo showed normal LVF with mild MR. She occasionally notices a skipped heart beat.  Wt Readings from Last 3 Encounters:   08/15/13  222 lb (100.699 kg)   09/28/11  233 lb 11 oz (106 kg)    Past Medical History   Diagnosis  Date   .  Hypertension    .  Degenerative disk disease    .  Obesity    .  Multiple fractures of ribs of left side    .  Bilateral contusions of knee    .  Coronary artery disease      pci 2006, repeat cath 30% mid LAD distal to stent otherwise normal coronary arteries   .  Hyperlipidemia    .  GERD (gastroesophageal reflux disease)    .  OA (osteoarthritis)    .  Osteopenia    .  PVC's (premature ventricular contractions)     Current Outpatient Prescriptions   Medication  Sig  Dispense  Refill   .  aspirin 81 MG chewable tablet  Chew 81 mg by mouth every evening.     Marland Kitchen  azelastine (ASTELIN) 137 MCG/SPRAY nasal spray  Place 1 spray into the nose 2 (two) times daily as needed. Allergies.     .  Calcium Carbonate-Vitamin D (CALCIUM-VITAMIN D) 500-200 MG-UNIT per tablet  Take 1 tablet by mouth 2 (two) times daily with a meal. Calcium/vitamin d 600mg /400iu twice daily on Sunday, Tuesday, Thursday, and Saturday     .  cetirizine (ZYRTEC) 10 MG tablet  Take  10 mg by mouth every morning.     .  cholecalciferol (VITAMIN D) 400 UNITS TABS  Take 400 Units by mouth 2 (two) times daily. Monday, Wednesday, and Friday     .  docusate sodium (COLACE) 100 MG capsule  Take 100 mg by mouth every evening.     Marland Kitchen  esomeprazole (NEXIUM) 40 MG capsule  Take 40 mg by mouth daily before breakfast.     .  ezetimibe (ZETIA) 10 MG tablet  Take 10 mg by mouth every evening.     Marland Kitchen  Fesoterodine Fumarate (TOVIAZ) 8 MG TB24  Take 8 mg by mouth every morning.     .  fish oil-omega-3 fatty acids 1000 MG capsule  Take 1 g by mouth 2 (two) times daily.     Marland Kitchen  glucosamine-chondroitin 500-400 MG tablet  Take 1 tablet by mouth 2 (two) times daily.     .  hyoscyamine (LEVSIN SL) 0.125 MG SL tablet  Place 0.125 mg under the tongue every 4 (four) hours as needed. Nausea     .  isosorbide mononitrate (IMDUR) 30 MG 24 hr tablet  Take 30 mg  by mouth every morning.     .  meloxicam (MOBIC) 15 MG tablet  Take 15 mg by mouth every evening.     .  metoprolol succinate (TOPROL-XL) 25 MG 24 hr tablet  Take 12.5 mg by mouth every morning.     .  nitroGLYCERIN (NITROSTAT) 0.4 MG SL tablet  Place 0.4 mg under the tongue every 5 (five) minutes as needed. As needed for chest pain x 3 doses     .  potassium chloride SA (K-DUR,KLOR-CON) 20 MEQ tablet  Take 20 mEq by mouth every morning.     .  sodium chloride (OCEAN) 0.65 % nasal spray  Place 1 spray into the nose as needed. Allergies     .  vitamin C (ASCORBIC ACID) 500 MG tablet  Take 500 mg by mouth 2 (two) times daily.     .  vitamin E 400 UNIT capsule  Take 400 Units by mouth 2 (two) times daily.      No current facility-administered medications for this visit.   Allergies:  Allergies   Allergen  Reactions   .  Adhesive [Tape]  Other (See Comments)     irritation   .  Codeine  Nausea And Vomiting   .  Sulfa Antibiotics  Hives and Itching   .  Penicillins  Itching and Rash   Social History: The patient reports that she has never smoked.  She does not have any smokeless tobacco history on file. She reports that she drinks about 0.6 ounces of alcohol per week. She reports that she does not use illicit drugs.  Family History: The patient's family history is not on file.  ROS: Please see the history of present illness. All other systems reviewed and negative.  PHYSICAL EXAM:  VS: BP 142/82  Pulse 72  Ht 5\' 4"  (1.626 m)  Wt 222 lb (100.699 kg)  BMI 38.09 kg/m2  Well nourished, well developed, in no acute distress  HEENT: normal  Neck: no JVD  Cardiac: normal S1, S2; RRR; no murmur  Lungs: clear to auscultation bilaterally, no wheezing, rhonchi or rales  Abd: soft, nontender, no hepatomegaly  Ext: no edema  Skin: warm and dry  Neuro: CNs 2-12 intact, no focal abnormalities noted  EKG; NSR with no ST changes  ASSESSMENT AND PLAN:  1. DOE Chest tightness - she continues to have chest tightness on a daily basis despite low risk nuclear stress test.  Her CP is identical to her previous angina and therefore will plan left heart cath.  Cardiac catheterization was discussed with the patient fully including risks on myocardial infarction, death, stroke, bleeding, arrhythmia, dye allergy, renal insufficiency or bleeding.  All patient questions and concerns were discussed and the patient understands and is willing to proceed.   3. CAD with most recent cath in 2011 showing patent LAD stent with 30% stenosis distal to the stent - continue ASA/Imdur  4. HTN - continue Toprol/Benicar  5. Dyslipidemia - continue Zetia/fish oil/Lipitor  6. Obesity

## 2013-09-19 NOTE — Progress Notes (Signed)
UP AND WALKED AND TOL WELL; RIGHT GROIN STABLE; NO BLEEDING OR HEMATOMA 

## 2013-09-19 NOTE — Interval H&P Note (Signed)
History and Physical Interval Note:  09/19/2013 8:39 AM  Brittany Robertson  has presented today for surgery, with the diagnosis of Chest pain  The various methods of treatment have been discussed with the patient and family. After consideration of risks, benefits and other options for treatment, the patient has consented to  Procedure(s): LEFT HEART CATHETERIZATION WITH CORONARY ANGIOGRAM (N/A) as a surgical intervention .  The patient's history has been reviewed, patient examined, no change in status, stable for surgery.  I have reviewed the patient's chart and labs.  Questions were answered to the patient's satisfaction.     Shaneque Merkle R

## 2013-09-19 NOTE — Op Note (Signed)
PROCEDURE:  Left heart catheterization with selective coronary angiography, left ventriculogram.  INDICATIONS:  Chest pain and SOB with known ASCAD  The risks, benefits, and details of the procedure were explained to the patient.  The patient verbalized understanding and wanted to proceed.  Informed written consent was obtained.  PROCEDURE TECHNIQUE:  After Xylocaine anesthesia a 61F sheath was placed in the right femoral artery with a single anterior needle wall stick.   Left coronary angiography was done using a Judkins L4 guide catheter.  Right coronary angiography was done using a Judkins R4 guide catheter.  Left ventriculography was done using a pigtail catheter.    CONTRAST:  Total of 85 cc.  COMPLICATIONS:  None.    HEMODYNAMICS:  Aortic pressure was 130/40mmHg; LV pressure was 141/67mmHg; LVEDP .  There was no gradient between the left ventricle and aorta.    ANGIOGRAPHIC DATA:   The left main coronary artery is widely patent and bifurcates into an LAD and left circumflex arteries.  The left anterior descending artery is widely patent.  There is a stent in the proximal portion which is widely patent.  The ongoing LAD traverses the apex and is patent.  It gives rise to a large first diagonal which is patent and bifurcates into 2 daughter branches which are patent.    The left circumflex artery is widely patent throughout its course in the AV groove.  It gives rise to 2 OM branches which are widely patent.   The right coronary artery is widely patent throughout its course and gives rise to an acute RV marginal branch which is patent and distally bifurcates into a PDA and PL branches which are widely patent.  LEFT VENTRICULOGRAM:  Left ventricular angiogram was done in the 30 RAO projection and revealed normal left ventricular wall motion and systolic function with an estimated ejection fraction of 55%.  LVEDP was 17 mmHg.  IMPRESSIONS:  1. Normal left main coronary  artery. 2. Normal left anterior descending artery and its branches with widely patent LAD stent 3. Normal left circumflex artery and its branches. 4. Normal right coronary artery. 5. Normal left ventricular systolic function.  LVEDP 17 mmHg.  Ejection fraction 55%. 6.  Mildly elevated LVEDP c/w diastolic dysfunction  RECOMMENDATION:   1.  Continue risk factor modification. 2.  I have recommended that patient get into a good diet and exercise program since I suspect a lot of her SOB is due to deconditioning and weight gain. 3.  Continue metoprolol/ASA/statin/Zetia/ARB. 4.  Stop Imdur 5.  Add Lasix for mildly elevated LVEDP 6.  Followup with me in 2 weeks in OV

## 2013-09-19 NOTE — Telephone Encounter (Signed)
New message     Returned the nurses call to get test results

## 2013-09-19 NOTE — Telephone Encounter (Signed)
Notified of lab results. 

## 2013-09-25 ENCOUNTER — Ambulatory Visit (INDEPENDENT_AMBULATORY_CARE_PROVIDER_SITE_OTHER): Payer: 59 | Admitting: Cardiology

## 2013-09-25 ENCOUNTER — Encounter: Payer: Self-pay | Admitting: General Surgery

## 2013-09-25 ENCOUNTER — Encounter: Payer: Self-pay | Admitting: Cardiology

## 2013-09-25 VITALS — BP 146/84 | HR 61 | Ht 64.0 in | Wt 223.0 lb

## 2013-09-25 DIAGNOSIS — R0609 Other forms of dyspnea: Secondary | ICD-10-CM

## 2013-09-25 DIAGNOSIS — I2510 Atherosclerotic heart disease of native coronary artery without angina pectoris: Secondary | ICD-10-CM

## 2013-09-25 DIAGNOSIS — I5032 Chronic diastolic (congestive) heart failure: Secondary | ICD-10-CM | POA: Insufficient documentation

## 2013-09-25 DIAGNOSIS — I10 Essential (primary) hypertension: Secondary | ICD-10-CM

## 2013-09-25 DIAGNOSIS — E785 Hyperlipidemia, unspecified: Secondary | ICD-10-CM

## 2013-09-25 DIAGNOSIS — I509 Heart failure, unspecified: Secondary | ICD-10-CM

## 2013-09-25 LAB — BASIC METABOLIC PANEL
Chloride: 104 mEq/L (ref 96–112)
GFR: 66.67 mL/min (ref >60.00)
Glucose, Bld: 98 mg/dL (ref 70–99)
Potassium: 4.4 mEq/L (ref 3.5–5.1)
Sodium: 138 mEq/L (ref 135–145)

## 2013-09-25 MED ORDER — OLMESARTAN MEDOXOMIL 40 MG PO TABS: 40.0000 mg | ORAL_TABLET | Freq: Every day | ORAL | Status: DC

## 2013-09-25 NOTE — Progress Notes (Signed)
[address] [place], [place]  [address] Phone: [phone] Fax:  [phone]  Date:  09/25/2013   ID:  Brittany Robertson, DOB 1941/04/02, MRN [id number]  PCP:  Lupita Raider, MD  Cardiologist:  Armanda Magic, MD     History of Present Illness: Brittany Robertson is a 72 y.o. female with a history of ASCAD/HTN/obesity and dyslipidemia who presents back for followup.  I recently saw her and she was having problems with SOB.  Her nuclear stress test was low risk but due to ongoing SOB with some chest pain she underwent cath showing patent LAD stent and otherwise normal coronary arteries.  Cath did show mild diastolic dysfunction and she was started on Laix and her beta blocker was continued.  She is doing well today but she still has DOE despite adding Lasix.  Her groin site healed with any problems   Wt Readings from Last 3 Encounters:  09/19/13 215 lb (97.523 kg)  09/19/13 215 lb (97.523 kg)  08/24/13 222 lb (100.699 kg)     Past Medical History  Diagnosis Date  . Hypertension   . Degenerative disk disease   . Obesity   . Multiple fractures of ribs of left side   . Bilateral contusions of knee   . Hyperlipidemia   . GERD (gastroesophageal reflux disease)   . OA (osteoarthritis)   . Osteopenia   . PVC's (premature ventricular contractions)   . Coronary artery disease     pci 2006, repeat cath 30% mid LAD distal to stent otherwise normal coronary arteries, cath 08/2013 with patent LAD stent and otherwise normal coronary arteries with diastolic dysfunction  . DOE (dyspnea on exertion)     chronic secondary to obesity, deconditiong and chronic diastolic CHF  . Chronic diastolic CHF (congestive heart failure)     Current Outpatient Prescriptions  Medication Sig Dispense Refill  . aspirin 81 MG chewable tablet Chew 81 mg by mouth every evening.        Marland Kitchen atorvastatin (LIPITOR) 20 MG tablet Take 20 mg by mouth daily. BRAND NAME ONLY due to ALLERGY      . Calcium  Carbonate-Vitamin D (CALCIUM + D PO) Take 1 tablet by mouth See admin instructions. Takes 1 twice a day every other day      . cetirizine (ZYRTEC) 10 MG tablet Take 10 mg by mouth every morning.        . cholecalciferol (VITAMIN D) 400 UNITS TABS Take 400 Units by mouth 2 (two) times daily.       Marland Kitchen docusate sodium (COLACE) 100 MG capsule Take 100 mg by mouth every evening.        Marland Kitchen esomeprazole (NEXIUM) 40 MG capsule Take 40 mg by mouth daily before breakfast.        . ezetimibe (ZETIA) 10 MG tablet Take 10 mg by mouth every evening.        Marland Kitchen Fesoterodine Fumarate (TOVIAZ) 8 MG TB24 Take 8 mg by mouth every evening.       . fish oil-omega-3 fatty acids 1000 MG capsule Take 1 g by mouth 2 (two) times daily.        . furosemide (LASIX) 20 MG tablet Take 1 tablet (20 mg total) by mouth daily.  30 tablet  11  . meloxicam (MOBIC) 15 MG tablet Take 15 mg by mouth every evening.        . metoprolol succinate (TOPROL-XL) 25 MG 24 hr tablet Take 25 mg by mouth  daily.       . Misc Natural Products (FLEX-A-MIN JOINT FLEX) TABS Take 1 tablet by mouth 2 (two) times daily.      . nitroGLYCERIN (NITROSTAT) 0.4 MG SL tablet Place 0.4 mg under the tongue every 5 (five) minutes as needed. As needed for chest pain x 3 doses       . olmesartan (BENICAR) 20 MG tablet Take 20 mg by mouth daily.      Bertram Gala Glycol-Propyl Glycol (SYSTANE ULTRA OP) Apply 1 drop to eye 2 (two) times daily as needed (dry eyes).      . potassium chloride SA (K-DUR,KLOR-CON) 20 MEQ tablet Take 20 mEq by mouth every morning.        . vitamin C (ASCORBIC ACID) 500 MG tablet Take 500 mg by mouth daily.       . vitamin E 400 UNIT capsule Take 400 Units by mouth every evening.        No current facility-administered medications for this visit.    Allergies:    Allergies  Allergen Reactions  . Atorvastatin Other (See Comments)    GENERIC causes severe skin rash but can take Brand Name  . Codeine Nausea And Vomiting  . Sulfa  Antibiotics Hives and Itching  . Adhesive [Tape] Itching, Rash and Other (See Comments)    Skin irritation  . Penicillins Itching and Rash    Social History:  The patient  reports that she has never smoked. She does not have any smokeless tobacco history on file. She reports that she drinks about 0.6 ounces of alcohol per week. She reports that she does not use illicit drugs.   Family History:  The patient's family history is not on file.   ROS:  Please see the history of present illness.      All other systems reviewed and negative.   PHYSICAL EXAM: VS:  There were no vitals taken for this visit. Well nourished, well developed, in no acute distress HEENT: normal Neck: no JVD Cardiac:  normal S1, S2; RRR; no murmur Lungs:  clear to auscultation bilaterally, no wheezing, rhonchi or rales Abd: soft, nontender, no hepatomegaly Ext: no edema Skin: warm and dry Neuro:  CNs 2-12 intact, no focal abnormalities noted       ASSESSMENT AND PLAN:  1. Chronic diastolic CHF  - continue Lasix/beta blocker  - check BMET 2. HTN  - continue beta blocker  - increase Olmesartan to 40mg  daily  - check BP daily for a week and call with the results 3. ASCAD with recent cath showing patent LAD stent and otherwise normal coronary arteries.  - continue ASA 4.  Dyslipidemia   - continue Atorvastatin/fish oil  5.  Obesity 6.  Chronic DOE secondary to #1, obesity and deconditioning.  She has lost 4 inches in height so this could lead to some restrictive lung disease as well.  She is still SOB today so I will increase her olmesartan to get her BP under control.  If she remains SOB with adequate BP control then I have recommended that she followup with her PCP to determine other noncardiac etiologies for SOB  Followup with me in 6 months   Signed, Armanda Magic, MD 09/25/2013 10:10 AM

## 2013-09-25 NOTE — Patient Instructions (Signed)
Your physician has recommended you make the following change in your medication: Increase Benicar to 40 MG daily. A RX has been sent into [facility]. When you Insurance changes in January 2015, just call us and let us know what pharmacy you would like [place] to send the refills to.  Your physician recommends that you go to the lab today for a BMET since starting the Lasix.  Your physician wants you to follow-up in: 6 Months with Dr Sherlyn Lick will receive a reminder letter in the mail two months in advance. If you don't receive a letter, please call our office to schedule the follow-up appointment.

## 2013-10-02 ENCOUNTER — Telehealth: Payer: Self-pay | Admitting: Cardiology

## 2013-10-02 NOTE — Telephone Encounter (Signed)
To Dr Turner to advise 

## 2013-10-02 NOTE — Telephone Encounter (Signed)
New message    Calling to give Dr Mayford Knife bp readings: 12-8  142/84 and 104/73 later that day; 12-9 112/68; 12-10 122/83; 12-11-122/61; 12-12 121/79; 12-13 125/74; 12-14 forgot to take it and 12-15 120/60 and she is down 4lbs in wt and breathing has greatly improved

## 2013-10-02 NOTE — Telephone Encounter (Signed)
Please let patient know that BP is stable and continue current meds

## 2013-10-03 NOTE — Telephone Encounter (Signed)
Pt.notified

## 2013-10-04 ENCOUNTER — Ambulatory Visit
Admission: RE | Admit: 2013-10-04 | Discharge: 2013-10-04 | Disposition: A | Discharge status: Home / Self Care | Payer: Medicare Other | Source: Ambulatory Visit | Attending: Family Medicine | Admitting: Family Medicine

## 2013-10-04 ENCOUNTER — Other Ambulatory Visit: Payer: Self-pay | Admitting: Family Medicine

## 2013-10-04 DIAGNOSIS — R05 Cough: Secondary | ICD-10-CM

## 2013-11-03 ENCOUNTER — Other Ambulatory Visit: Payer: Self-pay

## 2013-11-03 MED ORDER — ATORVASTATIN CALCIUM 20 MG PO TABS: 20.0000 mg | ORAL_TABLET | Freq: Every day | ORAL | Status: DC

## 2013-11-03 MED ORDER — METOPROLOL SUCCINATE ER 25 MG PO TB24: 25.0000 mg | ORAL_TABLET | Freq: Every day | ORAL | Status: DC

## 2013-11-03 MED ORDER — OLMESARTAN MEDOXOMIL 40 MG PO TABS: 40.0000 mg | ORAL_TABLET | Freq: Every day | ORAL | Status: DC

## 2013-11-03 MED ORDER — EZETIMIBE 10 MG PO TABS: 10.0000 mg | ORAL_TABLET | Freq: Every evening | ORAL | Status: DC

## 2013-11-03 MED ORDER — FUROSEMIDE 20 MG PO TABS: 20.0000 mg | ORAL_TABLET | Freq: Every day | ORAL | Status: DC

## 2013-11-03 MED ORDER — POTASSIUM CHLORIDE CRYS ER 20 MEQ PO TBCR: 20.0000 meq | EXTENDED_RELEASE_TABLET | ORAL | Status: DC

## 2013-11-22 ENCOUNTER — Other Ambulatory Visit: Payer: Self-pay

## 2013-11-22 DIAGNOSIS — Z1231 Encounter for screening mammogram for malignant neoplasm of breast: Secondary | ICD-10-CM

## 2013-12-14 ENCOUNTER — Ambulatory Visit: Payer: 59

## 2014-01-04 ENCOUNTER — Ambulatory Visit
Admission: RE | Admit: 2014-01-04 | Discharge: 2014-01-04 | Disposition: A | Discharge status: Home / Self Care | Payer: Medicare Other | Source: Ambulatory Visit

## 2014-01-04 DIAGNOSIS — Z1231 Encounter for screening mammogram for malignant neoplasm of breast: Secondary | ICD-10-CM

## 2014-01-15 ENCOUNTER — Other Ambulatory Visit: Payer: Self-pay | Admitting: General Surgery

## 2014-01-15 ENCOUNTER — Encounter: Payer: Self-pay | Admitting: General Surgery

## 2014-01-15 ENCOUNTER — Ambulatory Visit (INDEPENDENT_AMBULATORY_CARE_PROVIDER_SITE_OTHER): Payer: 59 | Admitting: *Deleted

## 2014-01-15 DIAGNOSIS — E785 Hyperlipidemia, unspecified: Secondary | ICD-10-CM

## 2014-01-15 DIAGNOSIS — I10 Essential (primary) hypertension: Secondary | ICD-10-CM

## 2014-01-15 LAB — HEPATIC FUNCTION PANEL
ALK PHOS: 49 U/L (ref 39–117)
ALT: 15 U/L (ref 0–35)
AST: 16 U/L (ref 0–37)
Albumin: 3.6 g/dL (ref 3.5–5.2)
Bilirubin, Direct: 0.1 mg/dL (ref 0.0–0.3)
Total Bilirubin: 0.9 mg/dL (ref 0.3–1.2)
Total Protein: 6.3 g/dL (ref 6.0–8.3)

## 2014-01-15 LAB — LIPID PANEL
Cholesterol: 118 mg/dL (ref 0–200)
HDL: 49.3 mg/dL (ref >39.00)
LDL Cholesterol: 52 mg/dL (ref 0–99)
Total CHOL/HDL Ratio: 2
Triglycerides: 84 mg/dL (ref 0.0–149.0)
VLDL: 16.8 mg/dL (ref 0.0–40.0)

## 2014-01-31 ENCOUNTER — Encounter: Payer: Self-pay | Admitting: Cardiology

## 2014-02-02 ENCOUNTER — Encounter: Payer: Self-pay | Admitting: Cardiology

## 2014-02-14 ENCOUNTER — Other Ambulatory Visit: Payer: Self-pay | Admitting: Physician Assistant

## 2014-02-19 ENCOUNTER — Encounter: Payer: Self-pay | Admitting: Podiatry

## 2014-02-19 ENCOUNTER — Ambulatory Visit (INDEPENDENT_AMBULATORY_CARE_PROVIDER_SITE_OTHER): Payer: 59 | Admitting: Podiatry

## 2014-02-19 VITALS — BP 140/83 | HR 83 | Resp 18

## 2014-02-19 DIAGNOSIS — B351 Tinea unguium: Secondary | ICD-10-CM

## 2014-02-19 NOTE — Progress Notes (Signed)
   Subjective:    Patient ID: Brittany Robertson, female    DOB: Nov 05, 1940, 73 y.o.   MRN: [id number]  HPI I am here to get my toenails trimmed up. The last visit for similar service was 08/23/2012 She states he had foot surgery since last visit    Review of Systems  Musculoskeletal: Positive for back pain.  Allergic/Immunologic: Positive for environmental allergies.  Hematological: Bruises/bleeds easily.  All other systems reviewed and are negative.      Objective:   Physical Exam A day x45 73 year old white female  Well-healed surgical incision second toes noted bilaterally. These toes are rectus. Nails x10 are elongated, incurvated, discolored and hypertrophy.                Assessment & Plan:   Assessment: Symptomatic onychomycoses x10  Plan: Debrided toenails x10 without a bleeding. Reappoint at patient's request

## 2014-03-19 HISTORY — PX: OTHER SURGICAL HISTORY: SHX169

## 2014-04-23 ENCOUNTER — Other Ambulatory Visit: Payer: Self-pay | Admitting: Gastroenterology

## 2014-06-07 ENCOUNTER — Encounter: Payer: Self-pay | Admitting: Cardiology

## 2014-06-07 ENCOUNTER — Other Ambulatory Visit: Payer: Self-pay | Admitting: General Surgery

## 2014-06-07 ENCOUNTER — Ambulatory Visit (INDEPENDENT_AMBULATORY_CARE_PROVIDER_SITE_OTHER): Payer: Medicare Other | Admitting: Cardiology

## 2014-06-07 VITALS — BP 128/88 | HR 62 | Ht 64.0 in | Wt 219.0 lb

## 2014-06-07 DIAGNOSIS — I10 Essential (primary) hypertension: Secondary | ICD-10-CM

## 2014-06-07 DIAGNOSIS — I2510 Atherosclerotic heart disease of native coronary artery without angina pectoris: Secondary | ICD-10-CM

## 2014-06-07 DIAGNOSIS — I509 Heart failure, unspecified: Secondary | ICD-10-CM

## 2014-06-07 DIAGNOSIS — R0989 Other specified symptoms and signs involving the circulatory and respiratory systems: Secondary | ICD-10-CM

## 2014-06-07 DIAGNOSIS — R0609 Other forms of dyspnea: Secondary | ICD-10-CM

## 2014-06-07 DIAGNOSIS — I5032 Chronic diastolic (congestive) heart failure: Secondary | ICD-10-CM

## 2014-06-07 DIAGNOSIS — E785 Hyperlipidemia, unspecified: Secondary | ICD-10-CM

## 2014-06-07 MED ORDER — ATORVASTATIN CALCIUM 20 MG PO TABS: 20.0000 mg | ORAL_TABLET | Freq: Every day | ORAL | Status: DC

## 2014-06-07 MED ORDER — POTASSIUM CHLORIDE CRYS ER 20 MEQ PO TBCR: 20.0000 meq | EXTENDED_RELEASE_TABLET | ORAL | Status: DC

## 2014-06-07 MED ORDER — METOPROLOL SUCCINATE ER 25 MG PO TB24: 25.0000 mg | ORAL_TABLET | Freq: Every day | ORAL | Status: DC

## 2014-06-07 MED ORDER — OLMESARTAN MEDOXOMIL 40 MG PO TABS: 40.0000 mg | ORAL_TABLET | Freq: Every day | ORAL | Status: DC

## 2014-06-07 MED ORDER — EZETIMIBE 10 MG PO TABS: 10.0000 mg | ORAL_TABLET | Freq: Every evening | ORAL | Status: DC

## 2014-06-07 NOTE — Progress Notes (Addendum)
[facility], [place] [place], [place]  [address] Phone: [phone] Fax:  [phone]  Date:  06/07/2014   ID:  Brittany Robertson, DOB 13-Apr-1941, MRN [id number]  PCP:  Mayra Neer, MD  Cardiologist:  Fransico Him, MD     History of Present Illness: Brittany Robertson is a 73 y.o. female with a history of ASCAD/HTN/obesity and dyslipidemia who presents back for followup.  She has had some SOB in the past and her nuclear stress test was low risk but due to ongoing SOB with some chest pain she underwent cath showing patent LAD stent and otherwise normal coronary arteries. Cath did show mild diastolic dysfunction and she was started on Laix and her beta blocker was continued. She is doing well today and SOB has significantly improved.  She only takes Lasix if she feels her SOB starting up.  She denies any LE edema, dizziness, or syncope.   Wt Readings from Last 3 Encounters:  06/07/14 219 lb (99.338 kg)  09/25/13 223 lb (101.152 kg)  09/19/13 215 lb (97.523 kg)     Past Medical History  Diagnosis Date  . Hypertension   . Degenerative disk disease   . Obesity   . Multiple fractures of ribs of left side   . Bilateral contusions of knee   . Hyperlipidemia   . GERD (gastroesophageal reflux disease)   . OA (osteoarthritis)   . Osteopenia   . PVC's (premature ventricular contractions)   . Coronary artery disease     pci 2006, repeat cath 30% mid LAD distal to stent otherwise normal coronary arteries, cath 08/2013 with patent LAD stent and otherwise normal coronary arteries with diastolic dysfunction  . DOE (dyspnea on exertion)     chronic secondary to obesity, deconditiong and chronic diastolic CHF  . Chronic diastolic CHF (congestive heart failure)     Current Outpatient Prescriptions  Medication Sig Dispense Refill  . aspirin 81 MG chewable tablet Chew 81 mg by mouth every evening.        Marland Kitchen atorvastatin (LIPITOR) 20 MG tablet Take 1 tablet (20 mg total) by mouth daily. BRAND  NAME ONLY due to ALLERGY  90 tablet  3  . Calcium Carbonate-Vitamin D (CALCIUM + D PO) Take 1 tablet by mouth See admin instructions. Takes 1 twice a day every other day      . cetirizine (ZYRTEC) 10 MG tablet Take 10 mg by mouth every morning.        . cholecalciferol (VITAMIN D) 400 UNITS TABS Take 400 Units by mouth 2 (two) times daily.       Marland Kitchen docusate sodium (COLACE) 100 MG capsule Take 100 mg by mouth every evening.        Marland Kitchen esomeprazole (NEXIUM) 40 MG capsule Take 40 mg by mouth daily before breakfast.        . ezetimibe (ZETIA) 10 MG tablet Take 1 tablet (10 mg total) by mouth every evening.  90 tablet  3  . Fesoterodine Fumarate (TOVIAZ) 8 MG TB24 Take 8 mg by mouth every evening.       . fish oil-omega-3 fatty acids 1000 MG capsule Take 1 g by mouth 2 (two) times daily.        . furosemide (LASIX) 20 MG tablet Take 20 mg by mouth as needed.      . meloxicam (MOBIC) 15 MG tablet Take 15 mg by mouth every evening.        . metoprolol succinate (TOPROL-XL) 25 MG  24 hr tablet Take 1 tablet (25 mg total) by mouth daily.  90 tablet  3  . Misc Natural Products (FLEX-A-MIN JOINT FLEX) TABS Take 1 tablet by mouth 2 (two) times daily.      . nitroGLYCERIN (NITROSTAT) 0.4 MG SL tablet Place 0.4 mg under the tongue every 5 (five) minutes as needed. As needed for chest pain x 3 doses       . olmesartan (BENICAR) 40 MG tablet Take 1 tablet (40 mg total) by mouth daily.  90 tablet  3  . Polyethyl Glycol-Propyl Glycol (SYSTANE ULTRA OP) Apply 1 drop to eye 2 (two) times daily as needed (dry eyes).      . potassium chloride SA (K-DUR,KLOR-CON) 20 MEQ tablet Take 1 tablet (20 mEq total) by mouth every morning.  90 tablet  3  . vitamin C (ASCORBIC ACID) 500 MG tablet Take 500 mg by mouth daily.       . vitamin E 400 UNIT capsule Take 400 Units by mouth every evening.        No current facility-administered medications for this visit.    Allergies:    Allergies  Allergen Reactions  . Atorvastatin  Other (See Comments)    GENERIC causes severe skin rash but can take Brand Name  . Codeine Nausea And Vomiting  . Sulfa Antibiotics Hives and Itching  . Adhesive [Tape] Itching, Rash and Other (See Comments)    Skin irritation  . Penicillins Itching and Rash    Social History:  The patient  reports that she has never smoked. She does not have any smokeless tobacco history on file. She reports that she drinks about .6 ounces of alcohol per week. She reports that she does not use illicit drugs.   Family History:  The patient's family history is not on file.   ROS:  Please see the history of present illness.      All other systems reviewed and negative.   PHYSICAL EXAM: VS:  BP 128/88  Pulse 62  Ht 5\' 4"  (1.626 m)  Wt 219 lb (99.338 kg)  BMI 37.57 kg/m2 Well nourished, well developed, in no acute distress HEENT: normal Neck: no JVD Cardiac:  normal S1, S2; RRR; no murmur Lungs:  clear to auscultation bilaterally, no wheezing, rhonchi or rales Abd: soft, nontender, no hepatomegaly Ext: no edema Skin: warm and dry Neuro:  CNs 2-12 intact, no focal abnormalities noted  EKG:  NSR with PAC's     ASSESSMENT AND PLAN:  1. Chronic diastolic CHF - continue Lasix/beta blocker  2. HTN - controlled - continue beta blocker/ARB  3. ASCAD with recent cath showing patent LAD stent and otherwise normal coronary arteries. - continue ASA       4. Dyslipidemia  - continue Atorvastatin/fish oil       5. Obesity       6. Chronic DOE secondary to #1, obesity and deconditioning.Followup with me in 6 months  Followup with me in 6 months   Signed, Fransico Him, MD 06/07/2014 8:21 AM

## 2014-06-07 NOTE — Telephone Encounter (Signed)
Pt is switching over to [facility]  Pt needs New Rx sent in for  Lipitor 20 MG(Brand name) Zetia 10 MG Benicar 40 MG Metoprolol 25 MG Klor-con 20 Meq  Will send in for pt today at [facility]

## 2014-06-07 NOTE — Patient Instructions (Signed)
Your physician recommends that you continue on your current medications as directed. Please refer to the Current Medication list given to you today.  Your new Rx have been sent into [facility] for your.   Your physician wants you to follow-up in: 6 months with Dr Mallie Snooks will receive a reminder letter in the mail two months in advance. If you don't receive a letter, please call our office to schedule the follow-up appointment.

## 2014-06-08 ENCOUNTER — Telehealth: Payer: Self-pay

## 2014-06-08 ENCOUNTER — Telehealth: Payer: Self-pay | Admitting: Cardiology

## 2014-06-08 NOTE — Telephone Encounter (Signed)
Husband called for samples of zetia and benicar for this patient placed them up front

## 2014-06-08 NOTE — Telephone Encounter (Signed)
LM to make pts aware.

## 2014-06-08 NOTE — Telephone Encounter (Signed)
Called and pts Medication has been Approved thru December 2015. Will have to do another PA in Jan 2016.

## 2014-06-08 NOTE — Telephone Encounter (Signed)
New message     Please call [phone] to get a prior authorization on the lipitor.  Pt cannot take generic because it breaks her out in a rash.  Call Mr Lender after it has been done---

## 2014-06-12 NOTE — Progress Notes (Signed)
Patient ID: Brittany Robertson, female   DOB: 04-Oct-1941, 73 y.o.   MRN: [id number] Patient was approved for non-formulary exception until 10/18/14. Patient ID # [id number]

## 2014-06-21 ENCOUNTER — Telehealth: Payer: Self-pay

## 2014-06-21 NOTE — Telephone Encounter (Signed)
Lm for patient to pick up samples of benicar 40 mg

## 2014-07-12 ENCOUNTER — Telehealth: Payer: Self-pay

## 2014-07-12 NOTE — Telephone Encounter (Signed)
Husband called for samples of zetia and benicar 40mg  for wife placed samples up front

## 2014-07-18 ENCOUNTER — Encounter: Payer: Self-pay | Admitting: General Surgery

## 2014-07-18 ENCOUNTER — Other Ambulatory Visit (INDEPENDENT_AMBULATORY_CARE_PROVIDER_SITE_OTHER): Payer: Medicare Other

## 2014-07-18 DIAGNOSIS — E785 Hyperlipidemia, unspecified: Secondary | ICD-10-CM

## 2014-07-18 LAB — HEPATIC FUNCTION PANEL
ALBUMIN: 3.3 g/dL — AB (ref 3.5–5.2)
ALT: 15 U/L (ref 0–35)
AST: 21 U/L (ref 0–37)
Alkaline Phosphatase: 51 U/L (ref 39–117)
BILIRUBIN TOTAL: 0.7 mg/dL (ref 0.2–1.2)
Bilirubin, Direct: 0.1 mg/dL (ref 0.0–0.3)
Total Protein: 6.4 g/dL (ref 6.0–8.3)

## 2014-07-18 LAB — LIPID PANEL
Cholesterol: 112 mg/dL (ref 0–200)
HDL: 35 mg/dL — ABNORMAL LOW (ref >39.00)
LDL CALC: 55 mg/dL (ref 0–99)
NonHDL: 77
Total CHOL/HDL Ratio: 3
Triglycerides: 110 mg/dL (ref 0.0–149.0)
VLDL: 22 mg/dL (ref 0.0–40.0)

## 2014-08-20 ENCOUNTER — Telehealth: Payer: Self-pay

## 2014-08-20 NOTE — Telephone Encounter (Signed)
PATIENT HUSBAND CALLED TO GET SAMPLES OF ZETIA AND BENICAR PLACED SAMPLES UP FRONT

## 2014-09-27 ENCOUNTER — Encounter (HOSPITAL_COMMUNITY): Payer: Self-pay | Admitting: Cardiology

## 2014-09-27 ENCOUNTER — Telehealth: Payer: Self-pay

## 2014-09-27 NOTE — Telephone Encounter (Signed)
Called husband to let him know that I placed samples of benicar and zetia up front

## 2014-10-16 ENCOUNTER — Telehealth: Payer: Self-pay | Admitting: Cardiology

## 2014-10-16 NOTE — Telephone Encounter (Signed)
Walk in pt form " [facility]" form dropped off gave to Katy/Turner

## 2014-10-22 ENCOUNTER — Telehealth: Payer: Self-pay | Admitting: Cardiology

## 2014-10-22 NOTE — Telephone Encounter (Signed)
Received request from Nurse fax box, documents faxed for surgical clearance. To: [employer] Fax number: [phone]  Attention: 1.4.16/KM

## 2014-11-14 ENCOUNTER — Telehealth: Payer: Self-pay

## 2014-11-14 NOTE — Telephone Encounter (Signed)
Called patient to let her know that I placed samples of zetia and benicar at the front desk for her

## 2014-12-04 ENCOUNTER — Other Ambulatory Visit: Payer: Self-pay

## 2014-12-04 DIAGNOSIS — Z1231 Encounter for screening mammogram for malignant neoplasm of breast: Secondary | ICD-10-CM

## 2014-12-06 NOTE — Progress Notes (Signed)
Cardiology Office Note   Date:  12/07/2014   ID:  Brittany Robertson, DOB 1941/01/09, MRN [id number]  PCP:  Mayra Neer, MD  Cardiologist:   Sueanne Margarita, MD   Chief Complaint  Patient presents with  . Coronary Artery Disease  . Hypertension  . Hyperlipidemia      History of Present Illness: Brittany Robertson is a 74 y.o. female with a history of ASCAD/HTN/obesity and dyslipidemia who presents back for followup. She has had some SOB in the past and her nuclear stress test was low risk but due to ongoing SOB with some chest pain she underwent cath showing patent LAD stent and otherwise normal coronary arteries. Cath did show mild diastolic dysfunction and she was started on Laix and her beta blocker was continued. She is doing well today and SOB has resolved.   She only takes Lasix if she feels her SOB starting up. She denies any LE edema, palpitations, dizziness, or syncope.    Past Medical History  Diagnosis Date  . Hypertension   . Degenerative disk disease   . Obesity   . Multiple fractures of ribs of left side   . Bilateral contusions of knee   . Hyperlipidemia   . GERD (gastroesophageal reflux disease)   . OA (osteoarthritis)   . Osteopenia   . PVC's (premature ventricular contractions)   . Coronary artery disease     pci 2006, repeat cath 30% mid LAD distal to stent otherwise normal coronary arteries, cath 08/2013 with patent LAD stent and otherwise normal coronary arteries with diastolic dysfunction  . DOE (dyspnea on exertion)     chronic secondary to obesity, deconditiong and chronic diastolic CHF  . Chronic diastolic CHF (congestive heart failure)     Past Surgical History  Procedure Laterality Date  . Knee arthroscopy    . Stents    . Left heart catheterization with coronary angiogram N/A 09/19/2013    Procedure: LEFT HEART CATHETERIZATION WITH CORONARY ANGIOGRAM;  Surgeon: Sueanne Margarita, MD;  Location: [facility];  Service: Cardiovascular;   Laterality: N/A;     Current Outpatient Prescriptions  Medication Sig Dispense Refill  . aspirin 81 MG tablet Take 81 mg by mouth daily.    Marland Kitchen atorvastatin (LIPITOR) 20 MG tablet Take 1 tablet (20 mg total) by mouth daily. BRAND NAME ONLY due to ALLERGY 90 tablet 3  . Calcium Carbonate-Vitamin D (CALCIUM + D PO) Take 1 tablet by mouth See admin instructions. Takes 1 twice a day every other day    . cetirizine (ZYRTEC) 10 MG tablet Take 10 mg by mouth every morning.      . cholecalciferol (VITAMIN D) 400 UNITS TABS Take 400 Units by mouth 2 (two) times daily.     Marland Kitchen docusate sodium (COLACE) 100 MG capsule Take 100 mg by mouth every evening.      Marland Kitchen esomeprazole (NEXIUM) 40 MG capsule Take 40 mg by mouth daily before breakfast.      . ezetimibe (ZETIA) 10 MG tablet Take 1 tablet (10 mg total) by mouth every evening. 90 tablet 3  . Fesoterodine Fumarate (TOVIAZ) 8 MG TB24 Take 8 mg by mouth every evening.     . fish oil-omega-3 fatty acids 1000 MG capsule Take 1 g by mouth 2 (two) times daily.      . furosemide (LASIX) 20 MG tablet Take 20 mg by mouth as needed.    . meloxicam (MOBIC) 15 MG tablet Take 15 mg by mouth  every evening.      . metoprolol succinate (TOPROL-XL) 25 MG 24 hr tablet Take 1 tablet (25 mg total) by mouth daily. 90 tablet 3  . Misc Natural Products (FLEX-A-MIN JOINT FLEX) TABS Take 1 tablet by mouth 2 (two) times daily.    . nitroGLYCERIN (NITROSTAT) 0.4 MG SL tablet Place 0.4 mg under the tongue every 5 (five) minutes as needed. As needed for chest pain x 3 doses     . olmesartan (BENICAR) 40 MG tablet Take 1 tablet (40 mg total) by mouth daily. 90 tablet 3  . Polyethyl Glycol-Propyl Glycol (SYSTANE ULTRA OP) Apply 1 drop to eye 2 (two) times daily as needed (dry eyes).    . potassium chloride SA (K-DUR,KLOR-CON) 20 MEQ tablet Take 1 tablet (20 mEq total) by mouth every morning. 90 tablet 3  . vitamin C (ASCORBIC ACID) 500 MG tablet Take 500 mg by mouth daily.     . vitamin E  400 UNIT capsule Take 400 Units by mouth every evening.      No current facility-administered medications for this visit.    Allergies:   Atorvastatin; Codeine; Sulfa antibiotics; Adhesive; and Penicillins    Social History:  The patient  reports that she has never smoked. She does not have any smokeless tobacco history on file. She reports that she drinks about 0.6 oz of alcohol per week. She reports that she does not use illicit drugs.   Family History:  The patient's family history includes Cancer in her mother; Heart Problems in her father; Hypertension in her father and mother.    ROS:  Please see the history of present illness.   Otherwise, review of systems are positive for none.   All other systems are reviewed and negative.    PHYSICAL EXAM: VS:  BP 130/82 mmHg  Pulse 79  Ht 5\' 4"  (1.626 m)  Wt 217 lb (98.431 kg)  BMI 37.23 kg/m2  SpO2 98% , BMI Body mass index is 37.23 kg/(m^2). GEN: Well nourished, well developed, in no acute distress HEENT: normal Neck: no JVD, carotid bruits, or masses Cardiac: RRR; no murmurs, rubs, or gallops,no edema  Respiratory:  clear to auscultation bilaterally, normal work of breathing GI: soft, nontender, nondistended, + BS MS: no deformity or atrophy Skin: warm and dry, no rash Neuro:  Strength and sensation are intact Psych: euthymic mood, full affect   EKG:  EKG is not ordered today.    Recent Labs: 07/18/2014: ALT 15    Lipid Panel    Component Value Date/Time   CHOL 112 07/18/2014 0759   TRIG 110.0 07/18/2014 0759   HDL 35.00* 07/18/2014 0759   CHOLHDL 3 07/18/2014 0759   VLDL 22.0 07/18/2014 0759   LDLCALC 55 07/18/2014 0759      Wt Readings from Last 3 Encounters:  12/07/14 217 lb (98.431 kg)  06/07/14 219 lb (99.338 kg)  09/25/13 223 lb (101.152 kg)       ASSESSMENT AND PLAN:  1. Chronic diastolic CHF - continue PRNLasix/beta blocker  2. HTN - controlled - continue beta blocker/ARB  - check BMET ( she  has been taking potassium daily despite only taking Lasix PRN) 3. ASCAD with recent cath showing patent LAD stent and otherwise normal coronary arteries. - continue ASA   4. Dyslipidemia - LDL at goal - continue Atorvastatin/fish oil/zetia - check FLP and ALT  5. Obesity   6. Chronic DOE secondary to #1, obesity and deconditioning - resolved   Current medicines  are reviewed at length with the patient today.  The patient does not have concerns regarding medicines.  The following changes have been made:  no change  Labs/ tests ordered today include: BMET     Disposition:   FU with me in 1 year   Signed, Sueanne Margarita, MD  12/07/2014 8:52 AM    [facility] [address], [place], [place]  [address] Phone: [phone]; Fax: [phone]

## 2014-12-07 ENCOUNTER — Encounter: Payer: Self-pay | Admitting: Cardiology

## 2014-12-07 ENCOUNTER — Ambulatory Visit (INDEPENDENT_AMBULATORY_CARE_PROVIDER_SITE_OTHER): Payer: Medicare Other | Admitting: Cardiology

## 2014-12-07 VITALS — BP 130/82 | HR 79 | Ht 64.0 in | Wt 217.0 lb

## 2014-12-07 DIAGNOSIS — I2583 Coronary atherosclerosis due to lipid rich plaque: Secondary | ICD-10-CM

## 2014-12-07 DIAGNOSIS — I10 Essential (primary) hypertension: Secondary | ICD-10-CM

## 2014-12-07 DIAGNOSIS — I493 Ventricular premature depolarization: Secondary | ICD-10-CM

## 2014-12-07 DIAGNOSIS — I2510 Atherosclerotic heart disease of native coronary artery without angina pectoris: Secondary | ICD-10-CM

## 2014-12-07 DIAGNOSIS — I5032 Chronic diastolic (congestive) heart failure: Secondary | ICD-10-CM

## 2014-12-07 DIAGNOSIS — E785 Hyperlipidemia, unspecified: Secondary | ICD-10-CM

## 2014-12-07 LAB — HEPATIC FUNCTION PANEL
ALT: 13 U/L (ref 0–35)
AST: 16 U/L (ref 0–37)
Albumin: 3.6 g/dL (ref 3.5–5.2)
Alkaline Phosphatase: 55 U/L (ref 39–117)
BILIRUBIN DIRECT: 0.1 mg/dL (ref 0.0–0.3)
BILIRUBIN TOTAL: 0.6 mg/dL (ref 0.2–1.2)
Total Protein: 6.3 g/dL (ref 6.0–8.3)

## 2014-12-07 LAB — LIPID PANEL
CHOL/HDL RATIO: 3
Cholesterol: 119 mg/dL (ref 0–200)
HDL: 41.1 mg/dL (ref >39.00)
LDL CALC: 56 mg/dL (ref 0–99)
NONHDL: 77.9
Triglycerides: 109 mg/dL (ref 0.0–149.0)
VLDL: 21.8 mg/dL (ref 0.0–40.0)

## 2014-12-07 LAB — BASIC METABOLIC PANEL
BUN: 33 mg/dL — AB (ref 6–23)
CO2: 27 mEq/L (ref 19–32)
CREATININE: 0.88 mg/dL (ref 0.40–1.20)
Calcium: 9.1 mg/dL (ref 8.4–10.5)
Chloride: 109 mEq/L (ref 96–112)
GFR: 66.45 mL/min (ref >60.00)
GLUCOSE: 92 mg/dL (ref 70–99)
Potassium: 4.1 mEq/L (ref 3.5–5.1)
Sodium: 141 mEq/L (ref 135–145)

## 2014-12-07 MED ORDER — NITROGLYCERIN 0.4 MG SL SUBL: 0.4000 mg | SUBLINGUAL_TABLET | SUBLINGUAL | Status: DC | PRN

## 2014-12-07 NOTE — Patient Instructions (Signed)
Your physician recommends that you have lab work TODAY (BMET, LFTs, lipid panel).  Your physician recommends that you continue on your current medications as directed. Please refer to the Current Medication list given to you today.  Your physician wants you to follow-up in: 1 year with Dr. Radford Pax. You will receive a reminder letter in the mail two months in advance. If you don't receive a letter, please call our office to schedule the follow-up appointment.

## 2014-12-10 ENCOUNTER — Other Ambulatory Visit: Payer: Self-pay | Admitting: Orthopedic Surgery

## 2014-12-10 NOTE — H&P (Signed)
TOTAL KNEE ADMISSION H&P  Patient is being admitted for left total knee arthroplasty.  Subjective:  Chief Complaint:left knee pain.  HPI: Brittany Robertson, 74 y.o. female, has a history of pain and functional disability in the left knee due to arthritis and has failed non-surgical conservative treatments for greater than 12 weeks to includeNSAID's and/or analgesics, corticosteriod injections, viscosupplementation injections, supervised PT with diminished ADL's post treatment, use of assistive devices, weight reduction as appropriate and activity modification.  Onset of symptoms was gradual, starting 6 years ago with gradually worsening course since that time. The patient noted prior procedures on the knee to include  arthroscopy on the left knee(s).  Patient currently rates pain in the left knee(s) at 8 out of 10 with activity. Patient has night pain, worsening of pain with activity and weight bearing and pain that interferes with activities of daily living.  Patient has evidence of subchondral sclerosis, periarticular osteophytes, joint subluxation and joint space narrowing by imaging studies. This patient has had osteoarthritis. There is no active infection.  Patient Active Problem List   Diagnosis Date Noted  . Chronic diastolic CHF (congestive heart failure)   . CAD (coronary artery disease) 09/19/2013  . DOE (dyspnea on exertion) 08/15/2013  . Dyslipidemia, goal LDL below 70 08/15/2013  . PVC's (premature ventricular contractions)   . Fall 09/28/2011  . Multiple fractures of ribs of left side 09/28/2011  . Bilateral contusions of knee 09/28/2011  . Obesity 09/28/2011  . Hypertension   . Degenerative disk disease    Past Medical History  Diagnosis Date  . Hypertension   . Degenerative disk disease   . Obesity   . Multiple fractures of ribs of left side   . Bilateral contusions of knee   . Hyperlipidemia   . GERD (gastroesophageal reflux disease)   . OA (osteoarthritis)   .  Osteopenia   . PVC's (premature ventricular contractions)   . Coronary artery disease     pci 2006, repeat cath 30% mid LAD distal to stent otherwise normal coronary arteries, cath 08/2013 with patent LAD stent and otherwise normal coronary arteries with diastolic dysfunction  . DOE (dyspnea on exertion)     chronic secondary to obesity, deconditiong and chronic diastolic CHF  . Chronic diastolic CHF (congestive heart failure)     Past Surgical History  Procedure Laterality Date  . Knee arthroscopy    . Stents    . Left heart catheterization with coronary angiogram N/A 09/19/2013    Procedure: LEFT HEART CATHETERIZATION WITH CORONARY ANGIOGRAM;  Surgeon: Sueanne Margarita, MD;  Location: [facility];  Service: Cardiovascular;  Laterality: N/A;    No prescriptions prior to admission   Allergies  Allergen Reactions  . Atorvastatin Other (See Comments)    GENERIC causes severe skin rash but can take Brand Name  . Codeine Nausea And Vomiting  . Sulfa Antibiotics Hives and Itching  . Adhesive [Tape] Itching, Rash and Other (See Comments)    Skin irritation  . Penicillins Itching and Rash    History  Substance Use Topics  . Smoking status: Never Smoker   . Smokeless tobacco: Not on file  . Alcohol Use: 0.6 oz/week    1 Glasses of wine per week    Family History  Problem Relation Age of Onset  . Hypertension Mother   . Cancer Mother   . Hypertension Father   . Heart Problems Father      Review of Systems  Constitutional: Negative.  HENT: Negative.   Eyes: Negative.   Respiratory: Negative.   Gastrointestinal: Negative.   Genitourinary: Negative.   Musculoskeletal: Positive for joint pain.  Skin: Negative.   Neurological: Negative.   Endo/Heme/Allergies: Negative.   Psychiatric/Behavioral: Negative.     Objective:  Physical Exam  Constitutional: She is oriented to person, place, and time. She appears well-developed.  HENT:  Head: Atraumatic.  Eyes: EOM are normal.   Neck: Normal range of motion.  Cardiovascular: Normal rate, regular rhythm and intact distal pulses.   Respiratory: Effort normal and breath sounds normal.  GI: Soft. Bowel sounds are normal.  Genitourinary:  Deferred  Neurological: She is alert and oriented to person, place, and time. She has normal reflexes.  Skin: Skin is warm and dry.  Psychiatric: Her behavior is normal.    Vital signs in last 24 hours:    Labs:   Estimated body mass index is 37.57 kg/(m^2) as calculated from the following:   Height as of 06/07/14: 5\' 4"  (1.626 m).   Weight as of 06/07/14: 99.338 kg (219 lb).   Imaging Review Plain radiographs demonstrate moderate degenerative joint disease of the left knee(s). The overall alignment ismild varus. The bone quality appears to be good for age and reported activity level.  Assessment/Plan:  End stage arthritis, left knee   The patient history, physical examination, clinical judgment of the provider and imaging studies are consistent with end stage degenerative joint disease of the left knee(s) and total knee arthroplasty is deemed medically necessary. The treatment options including medical management, injection therapy arthroscopy and arthroplasty were discussed at length. The risks and benefits of total knee arthroplasty were presented and reviewed. The risks due to aseptic loosening, infection, stiffness, patella tracking problems, thromboembolic complications and other imponderables were discussed. The patient acknowledged the explanation, agreed to proceed with the plan and consent was signed. Patient is being admitted for inpatient treatment for surgery, pain control, PT, OT, prophylactic antibiotics, VTE prophylaxis, progressive ambulation and ADL's and discharge planning. The patient is planning to be discharged home with home health services.  Contraindications and adverse affects of Tranexamic acid discussed in detail. Patient is not a candidate.

## 2014-12-12 ENCOUNTER — Other Ambulatory Visit: Payer: Self-pay

## 2014-12-12 MED ORDER — POTASSIUM CHLORIDE CRYS ER 20 MEQ PO TBCR: 20.0000 meq | EXTENDED_RELEASE_TABLET | ORAL | Status: DC

## 2014-12-14 ENCOUNTER — Other Ambulatory Visit: Payer: Self-pay | Admitting: *Deleted

## 2014-12-14 MED ORDER — POTASSIUM CHLORIDE CRYS ER 20 MEQ PO TBCR: 20.0000 meq | EXTENDED_RELEASE_TABLET | ORAL | Status: DC

## 2014-12-28 ENCOUNTER — Encounter (HOSPITAL_COMMUNITY): Payer: Self-pay | Admitting: *Deleted

## 2014-12-28 ENCOUNTER — Encounter (HOSPITAL_COMMUNITY)
Admission: RE | Admit: 2014-12-28 | Discharge: 2014-12-28 | Disposition: A | Discharge status: Home / Self Care | Payer: Medicare Other | Source: Ambulatory Visit | Attending: Specialist | Admitting: Specialist

## 2014-12-28 DIAGNOSIS — M1712 Unilateral primary osteoarthritis, left knee: Secondary | ICD-10-CM | POA: Diagnosis not present

## 2014-12-28 DIAGNOSIS — Z01812 Encounter for preprocedural laboratory examination: Secondary | ICD-10-CM | POA: Insufficient documentation

## 2014-12-28 LAB — ABO/RH: ABO/RH(D): O POS

## 2014-12-28 LAB — BASIC METABOLIC PANEL
Anion gap: 11 (ref 5–15)
BUN: 23 mg/dL (ref 6–23)
CO2: 23 mmol/L (ref 19–32)
Calcium: 9.3 mg/dL (ref 8.4–10.5)
Chloride: 104 mmol/L (ref 96–112)
Creatinine, Ser: 0.75 mg/dL (ref 0.50–1.10)
GFR, EST NON AFRICAN AMERICAN: 81 mL/min — AB (ref >90)
Glucose, Bld: 97 mg/dL (ref 70–99)
POTASSIUM: 4.1 mmol/L (ref 3.5–5.1)
Sodium: 138 mmol/L (ref 135–145)

## 2014-12-28 LAB — CBC
HCT: 40.5 % (ref 36.0–46.0)
Hemoglobin: 13.4 g/dL (ref 12.0–15.0)
MCH: 32.4 pg (ref 26.0–34.0)
MCHC: 33.1 g/dL (ref 30.0–36.0)
MCV: 97.8 fL (ref 78.0–100.0)
Platelets: 201 10*3/uL (ref 150–400)
RBC: 4.14 MIL/uL (ref 3.87–5.11)
RDW: 15 % (ref 11.5–15.5)
WBC: 6.5 10*3/uL (ref 4.0–10.5)

## 2014-12-28 LAB — URINALYSIS, ROUTINE W REFLEX MICROSCOPIC
BILIRUBIN URINE: NEGATIVE
Glucose, UA: NEGATIVE mg/dL
Hgb urine dipstick: NEGATIVE
Ketones, ur: NEGATIVE mg/dL
Nitrite: NEGATIVE
Protein, ur: NEGATIVE mg/dL
SPECIFIC GRAVITY, URINE: 1.016 (ref 1.005–1.030)
UROBILINOGEN UA: 0.2 mg/dL (ref 0.0–1.0)
pH: 5 (ref 5.0–8.0)

## 2014-12-28 LAB — URINE MICROSCOPIC-ADD ON

## 2014-12-28 LAB — PROTIME-INR
INR: 1.03 (ref 0.00–1.49)
PROTHROMBIN TIME: 13.7 s (ref 11.6–15.2)

## 2014-12-28 LAB — SURGICAL PCR SCREEN
MRSA, PCR: NEGATIVE
Staphylococcus aureus: NEGATIVE

## 2014-12-28 LAB — APTT: APTT: 28 s (ref 24–37)

## 2014-12-28 NOTE — Patient Instructions (Signed)
Brittany Robertson  12/28/2014   Your procedure is scheduled on: Friday 01/04/2015  Report to [facility] Main  Entrance and follow signs to               [facility] at  1100 AM.  Call this number if you have problems the morning of surgery [phone]   Remember:  Do not eat food  :After Midnight.  MAY HAVE CLEAR LIQUIDS FROM MIDNIGHT UP UNTIL 0700 AM THEN NOTHING UNTIL AFTER SURGERY!     Take these medicines the morning of surgery with A SIP OF WATER: Nexium, Metoprolol                               You may not have any metal on your body including hair pins and              piercings  Do not wear jewelry, make-up, lotions, powders or perfumes.             Do not wear nail polish.  Do not shave  48 hours prior to surgery.              Men may shave face and neck.   Do not bring valuables to the hospital. [place].  Contacts, dentures or bridgework may not be worn into surgery.  Leave suitcase in the car. After surgery it may be brought to your room.     Patients discharged the day of surgery will not be allowed to drive home.  Name and phone number of your driver:  Special Instructions: N/A              Please read over the following fact sheets you were given: _____________________________________________________________________             [facility] - Preparing for Surgery Before surgery, you can play an important role.  Because skin is not sterile, your skin needs to be as free of germs as possible.  You can reduce the number of germs on your skin by washing with CHG (chlorahexidine gluconate) soap before surgery.  CHG is an antiseptic cleaner which kills germs and bonds with the skin to continue killing germs even after washing. Please DO NOT use if you have an allergy to CHG or antibacterial soaps.  If your skin becomes reddened/irritated stop using the CHG and inform your nurse when you  arrive at Short Stay. Do not shave (including legs and underarms) for at least 48 hours prior to the first CHG shower.  You may shave your face/neck. Please follow these instructions carefully:  1.  Shower with CHG Soap the night before surgery and the  morning of Surgery.  2.  If you choose to wash your hair, wash your hair first as usual with your  normal  shampoo.  3.  After you shampoo, rinse your hair and body thoroughly to remove the  shampoo.                           4.  Use CHG as you would any other liquid soap.  You can apply chg directly  to the skin and wash  Gently with a scrungie or clean washcloth.  5.  Apply the CHG Soap to your body ONLY FROM THE NECK DOWN.   Do not use on face/ open                           Wound or open sores. Avoid contact with eyes, ears mouth and genitals (private parts).                       Wash face,  Genitals (private parts) with your normal soap.             6.  Wash thoroughly, paying special attention to the area where your surgery  will be performed.  7.  Thoroughly rinse your body with warm water from the neck down.  8.  DO NOT shower/wash with your normal soap after using and rinsing off  the CHG Soap.                9.  Pat yourself dry with a clean towel.            10.  Wear clean pajamas.            11.  Place clean sheets on your bed the night of your first shower and do not  sleep with pets. Day of Surgery : Do not apply any lotions/deodorants the morning of surgery.  Please wear clean clothes to the hospital/surgery center.  FAILURE TO FOLLOW THESE INSTRUCTIONS MAY RESULT IN THE CANCELLATION OF YOUR SURGERY PATIENT SIGNATURE_________________________________  NURSE SIGNATURE__________________________________  ________________________________________________________________________   Adam Phenix  An incentive spirometer is a tool that can help keep your lungs clear and active. This tool measures how  well you are filling your lungs with each breath. Taking long deep breaths may help reverse or decrease the chance of developing breathing (pulmonary) problems (especially infection) following:  A long period of time when you are unable to move or be active. BEFORE THE PROCEDURE   If the spirometer includes an indicator to show your best effort, your nurse or respiratory therapist will set it to a desired goal.  If possible, sit up straight or lean slightly forward. Try not to slouch.  Hold the incentive spirometer in an upright position. INSTRUCTIONS FOR USE   Sit on the edge of your bed if possible, or sit up as far as you can in bed or on a chair.  Hold the incentive spirometer in an upright position.  Breathe out normally.  Place the mouthpiece in your mouth and seal your lips tightly around it.  Breathe in slowly and as deeply as possible, raising the piston or the ball toward the top of the column.  Hold your breath for 3-5 seconds or for as long as possible. Allow the piston or ball to fall to the bottom of the column.  Remove the mouthpiece from your mouth and breathe out normally.  Rest for a few seconds and repeat Steps 1 through 7 at least 10 times every 1-2 hours when you are awake. Take your time and take a few normal breaths between deep breaths.  The spirometer may include an indicator to show your best effort. Use the indicator as a goal to work toward during each repetition.  After each set of 10 deep breaths, practice coughing to be sure your lungs are clear. If you have an incision (the cut made at the time of surgery),  support your incision when coughing by placing a pillow or rolled up towels firmly against it. Once you are able to get out of bed, walk around indoors and cough well. You may stop using the incentive spirometer when instructed by your caregiver.  RISKS AND COMPLICATIONS  Take your time so you do not get dizzy or light-headed.  If you are in  pain, you may need to take or ask for pain medication before doing incentive spirometry. It is harder to take a deep breath if you are having pain. AFTER USE  Rest and breathe slowly and easily.  It can be helpful to keep track of a log of your progress. Your caregiver can provide you with a simple table to help with this. If you are using the spirometer at home, follow these instructions: [place] IF:   You are having difficultly using the spirometer.  You have trouble using the spirometer as often as instructed.  Your pain medication is not giving enough relief while using the spirometer.  You develop fever of 100.5 F (38.1 C) or higher. SEEK IMMEDIATE MEDICAL CARE IF:   You cough up bloody sputum that had not been present before.  You develop fever of 102 F (38.9 C) or greater.  You develop worsening pain at or near the incision site. MAKE SURE YOU:   Understand these instructions.  Will watch your condition.  Will get help right away if you are not doing well or get worse. Document Released: 02/15/2007 Document Revised: 12/28/2011 Document Reviewed: 04/18/2007 ExitCare Patient Information 2014 ExitCare, [place].   ________________________________________________________________________  WHAT IS A BLOOD TRANSFUSION? Blood Transfusion Information  A transfusion is the replacement of blood or some of its parts. Blood is made up of multiple cells which provide different functions.  Red blood cells carry oxygen and are used for blood loss replacement.  White blood cells fight against infection.  Platelets control bleeding.  Plasma helps clot blood.  Other blood products are available for specialized needs, such as hemophilia or other clotting disorders. BEFORE THE TRANSFUSION  Who gives blood for transfusions?   Healthy volunteers who are fully evaluated to make sure their blood is safe. This is blood bank blood. Transfusion therapy is the safest it has  ever been in the practice of medicine. Before blood is taken from a donor, a complete history is taken to make sure that person has no history of diseases nor engages in risky social behavior (examples are intravenous drug use or sexual activity with multiple partners). The donor's travel history is screened to minimize risk of transmitting infections, such as malaria. The donated blood is tested for signs of infectious diseases, such as HIV and hepatitis. The blood is then tested to be sure it is compatible with you in order to minimize the chance of a transfusion reaction. If you or a relative donates blood, this is often done in anticipation of surgery and is not appropriate for emergency situations. It takes many days to process the donated blood. RISKS AND COMPLICATIONS Although transfusion therapy is very safe and saves many lives, the main dangers of transfusion include:   Getting an infectious disease.  Developing a transfusion reaction. This is an allergic reaction to something in the blood you were given. Every precaution is taken to prevent this. The decision to have a blood transfusion has been considered carefully by your caregiver before blood is given. Blood is not given unless the benefits outweigh the risks. AFTER THE TRANSFUSION  Right after receiving a blood transfusion, you will usually feel much better and more energetic. This is especially true if your red blood cells have gotten low (anemic). The transfusion raises the level of the red blood cells which carry oxygen, and this usually causes an energy increase.  The nurse administering the transfusion will monitor you carefully for complications. HOME CARE INSTRUCTIONS  No special instructions are needed after a transfusion. You may find your energy is better. Speak with your caregiver about any limitations on activity for underlying diseases you may have. SEEK MEDICAL CARE IF:   Your condition is not improving after your  transfusion.  You develop redness or irritation at the intravenous (IV) site. SEEK IMMEDIATE MEDICAL CARE IF:  Any of the following symptoms occur over the next 12 hours:  Shaking chills.  You have a temperature by mouth above 102 F (38.9 C), not controlled by medicine.  Chest, back, or muscle pain.  People around you feel you are not acting correctly or are confused.  Shortness of breath or difficulty breathing.  Dizziness and fainting.  You get a rash or develop hives.  You have a decrease in urine output.  Your urine turns a dark color or changes to pink, red, or brown. Any of the following symptoms occur over the next 10 days:  You have a temperature by mouth above 102 F (38.9 C), not controlled by medicine.  Shortness of breath.  Weakness after normal activity.  The white part of the eye turns yellow (jaundice).  You have a decrease in the amount of urine or are urinating less often.  Your urine turns a dark color or changes to pink, red, or brown. Document Released: 10/02/2000 Document Revised: 12/28/2011 Document Reviewed: 05/21/2008 ExitCare Patient Information 2014 ExitCare, [place].  _______________________________________________________________________   CLEAR LIQUID DIET   Foods Allowed                                                                     Foods Excluded  Coffee and tea, regular and decaf                             liquids that you cannot  Plain Jell-O in any flavor                                             see through such as: Fruit ices (not with fruit pulp)                                     milk, soups, orange juice  Iced Popsicles                                    All solid food Carbonated beverages, regular and diet  Cranberry, grape and apple juices Sports drinks like Gatorade Lightly seasoned clear broth or consume(fat free) Sugar, honey syrup  Sample Menu Breakfast                                 Lunch                                     Supper Cranberry juice                    Beef broth                            Chicken broth Jell-O                                     Grape juice                           Apple juice Coffee or tea                        Jell-O                                      Popsicle                                                Coffee or tea                        Coffee or tea  _____________________________________________________________________

## 2014-12-28 NOTE — Progress Notes (Signed)
   12/28/14 1047  OBSTRUCTIVE SLEEP APNEA  Have you ever been diagnosed with sleep apnea through a sleep study? No  Do you snore loudly (loud enough to be heard through closed doors)?  0  Do you often feel tired, fatigued, or sleepy during the daytime? 0  Has anyone observed you stop breathing during your sleep? 0  Do you have, or are you being treated for high blood pressure? 1  BMI more than 35 kg/m2? 1  Age over 76 years old? 1  Neck circumference greater than 40 cm/16 inches? 1  Gender: 0

## 2015-01-01 ENCOUNTER — Ambulatory Visit
Admission: RE | Admit: 2015-01-01 | Discharge: 2015-01-01 | Disposition: A | Discharge status: Home / Self Care | Payer: Medicare Other | Source: Ambulatory Visit

## 2015-01-01 DIAGNOSIS — Z1231 Encounter for screening mammogram for malignant neoplasm of breast: Secondary | ICD-10-CM

## 2015-01-04 ENCOUNTER — Inpatient Hospital Stay (HOSPITAL_COMMUNITY)
Admission: RE | Admit: 2015-01-04 | Discharge: 2015-01-06 | DRG: 470 | Disposition: A | Discharge status: Home Health | Payer: Medicare Other | Source: Ambulatory Visit | Attending: Specialist | Admitting: Specialist

## 2015-01-04 ENCOUNTER — Encounter (HOSPITAL_COMMUNITY): Payer: Self-pay | Admitting: *Deleted

## 2015-01-04 ENCOUNTER — Inpatient Hospital Stay (HOSPITAL_COMMUNITY): Payer: Medicare Other | Admitting: Anesthesiology

## 2015-01-04 ENCOUNTER — Encounter (HOSPITAL_COMMUNITY)
Admission: RE | Disposition: A | Discharge status: Home Health | Payer: Medicare Other | Source: Ambulatory Visit | Attending: Specialist

## 2015-01-04 DIAGNOSIS — I2510 Atherosclerotic heart disease of native coronary artery without angina pectoris: Secondary | ICD-10-CM | POA: Diagnosis not present

## 2015-01-04 DIAGNOSIS — Z01812 Encounter for preprocedural laboratory examination: Secondary | ICD-10-CM

## 2015-01-04 DIAGNOSIS — M1712 Unilateral primary osteoarthritis, left knee: Secondary | ICD-10-CM | POA: Diagnosis not present

## 2015-01-04 DIAGNOSIS — K219 Gastro-esophageal reflux disease without esophagitis: Secondary | ICD-10-CM | POA: Diagnosis present

## 2015-01-04 DIAGNOSIS — M25562 Pain in left knee: Secondary | ICD-10-CM | POA: Diagnosis present

## 2015-01-04 DIAGNOSIS — Z96659 Presence of unspecified artificial knee joint: Secondary | ICD-10-CM

## 2015-01-04 DIAGNOSIS — Z6837 Body mass index (BMI) 37.0-37.9, adult: Secondary | ICD-10-CM | POA: Diagnosis not present

## 2015-01-04 DIAGNOSIS — E669 Obesity, unspecified: Secondary | ICD-10-CM | POA: Diagnosis present

## 2015-01-04 DIAGNOSIS — I10 Essential (primary) hypertension: Secondary | ICD-10-CM | POA: Diagnosis present

## 2015-01-04 DIAGNOSIS — I5032 Chronic diastolic (congestive) heart failure: Secondary | ICD-10-CM | POA: Diagnosis present

## 2015-01-04 HISTORY — DX: Adverse effect of unspecified anesthetic, initial encounter: T41.45XA

## 2015-01-04 HISTORY — PX: TOTAL KNEE ARTHROPLASTY: SHX125

## 2015-01-04 HISTORY — DX: Other complications of anesthesia, initial encounter: T88.59XA

## 2015-01-04 LAB — TYPE AND SCREEN
ABO/RH(D): O POS
Antibody Screen: NEGATIVE

## 2015-01-04 SURGERY — ARTHROPLASTY, KNEE, TOTAL
Anesthesia: General | Site: Knee | Laterality: Left

## 2015-01-04 MED ORDER — DEXAMETHASONE SODIUM PHOSPHATE 10 MG/ML IJ SOLN
10.0000 mg | Freq: Once | INTRAMUSCULAR | Status: AC
  Administered 2015-01-04: 10 mg via INTRAVENOUS

## 2015-01-04 MED ORDER — OMEGA-3-ACID ETHYL ESTERS 1 G PO CAPS
1.0000 g | ORAL_CAPSULE | Freq: Two times a day (BID) | ORAL | Status: DC
  Administered 2015-01-04 – 2015-01-06 (×4): 1 g via ORAL
  Filled 2015-01-04 (×5): qty 1

## 2015-01-04 MED ORDER — METOPROLOL SUCCINATE ER 25 MG PO TB24
25.0000 mg | ORAL_TABLET | Freq: Every day | ORAL | Status: DC
  Administered 2015-01-05 – 2015-01-06 (×2): 25 mg via ORAL
  Filled 2015-01-04 (×2): qty 1

## 2015-01-04 MED ORDER — EZETIMIBE 10 MG PO TABS
10.0000 mg | ORAL_TABLET | Freq: Every day | ORAL | Status: DC
  Administered 2015-01-04 – 2015-01-05 (×2): 10 mg via ORAL
  Filled 2015-01-04 (×3): qty 1

## 2015-01-04 MED ORDER — FENTANYL CITRATE 0.05 MG/ML IJ SOLN
INTRAMUSCULAR | Status: AC
  Filled 2015-01-04: qty 5

## 2015-01-04 MED ORDER — KETOROLAC TROMETHAMINE 30 MG/ML IJ SOLN
INTRAMUSCULAR | Status: AC
  Filled 2015-01-04: qty 1

## 2015-01-04 MED ORDER — ATORVASTATIN CALCIUM 20 MG PO TABS: 20.0000 mg | ORAL_TABLET | Freq: Every day | ORAL | Status: DC

## 2015-01-04 MED ORDER — MAGNESIUM CITRATE PO SOLN: 1.0000 | Freq: Once | ORAL | Status: AC | PRN

## 2015-01-04 MED ORDER — METOCLOPRAMIDE HCL 10 MG PO TABS: 5.0000 mg | ORAL_TABLET | Freq: Three times a day (TID) | ORAL | Status: DC | PRN

## 2015-01-04 MED ORDER — CALCIUM CARBONATE-VITAMIN D 500-200 MG-UNIT PO TABS
1.0000 | ORAL_TABLET | Freq: Two times a day (BID) | ORAL | Status: DC
  Administered 2015-01-05 – 2015-01-06 (×3): 1 via ORAL
  Filled 2015-01-04 (×5): qty 1

## 2015-01-04 MED ORDER — PROMETHAZINE HCL 25 MG/ML IJ SOLN: 6.2500 mg | INTRAMUSCULAR | Status: DC | PRN

## 2015-01-04 MED ORDER — PROPOFOL 10 MG/ML IV BOLUS
INTRAVENOUS | Status: DC | PRN
  Administered 2015-01-04: 150 mg via INTRAVENOUS

## 2015-01-04 MED ORDER — ONDANSETRON HCL 4 MG PO TABS: 4.0000 mg | ORAL_TABLET | Freq: Four times a day (QID) | ORAL | Status: DC | PRN

## 2015-01-04 MED ORDER — ONDANSETRON HCL 4 MG/2ML IJ SOLN
INTRAMUSCULAR | Status: DC | PRN
Start: 2015-01-04 — End: 2015-01-04
  Administered 2015-01-04: 4 mg via INTRAVENOUS

## 2015-01-04 MED ORDER — ONDANSETRON HCL 4 MG/2ML IJ SOLN
4.0000 mg | Freq: Four times a day (QID) | INTRAMUSCULAR | Status: DC | PRN
  Administered 2015-01-04: 4 mg via INTRAVENOUS
  Filled 2015-01-04: qty 2

## 2015-01-04 MED ORDER — FENTANYL CITRATE 0.05 MG/ML IJ SOLN
INTRAMUSCULAR | Status: AC
  Filled 2015-01-04: qty 2

## 2015-01-04 MED ORDER — FERROUS SULFATE 325 (65 FE) MG PO TABS
325.0000 mg | ORAL_TABLET | Freq: Three times a day (TID) | ORAL | Status: DC
  Administered 2015-01-05 – 2015-01-06 (×4): 325 mg via ORAL
  Filled 2015-01-04 (×7): qty 1

## 2015-01-04 MED ORDER — METHOCARBAMOL 500 MG PO TABS
500.0000 mg | ORAL_TABLET | Freq: Four times a day (QID) | ORAL | Status: DC | PRN
  Administered 2015-01-06: 500 mg via ORAL
  Filled 2015-01-04: qty 1

## 2015-01-04 MED ORDER — DEXTROSE 5 % IV SOLN
500.0000 mg | Freq: Four times a day (QID) | INTRAVENOUS | Status: DC | PRN
  Administered 2015-01-04: 500 mg via INTRAVENOUS
  Filled 2015-01-04 (×2): qty 5

## 2015-01-04 MED ORDER — CLINDAMYCIN PHOSPHATE 600 MG/50ML IV SOLN
600.0000 mg | Freq: Four times a day (QID) | INTRAVENOUS | Status: AC
  Administered 2015-01-04 – 2015-01-05 (×2): 600 mg via INTRAVENOUS
  Filled 2015-01-04 (×2): qty 50

## 2015-01-04 MED ORDER — PHENOL 1.4 % MT LIQD: 1.0000 | OROMUCOSAL | Status: DC | PRN

## 2015-01-04 MED ORDER — POTASSIUM CHLORIDE CRYS ER 20 MEQ PO TBCR
20.0000 meq | EXTENDED_RELEASE_TABLET | Freq: Every day | ORAL | Status: DC
  Administered 2015-01-05 – 2015-01-06 (×2): 20 meq via ORAL
  Filled 2015-01-04 (×2): qty 1

## 2015-01-04 MED ORDER — ACETAMINOPHEN 325 MG PO TABS
650.0000 mg | ORAL_TABLET | Freq: Four times a day (QID) | ORAL | Status: DC | PRN
  Administered 2015-01-06: 650 mg via ORAL
  Filled 2015-01-04: qty 2

## 2015-01-04 MED ORDER — DEXAMETHASONE SODIUM PHOSPHATE 10 MG/ML IJ SOLN
10.0000 mg | Freq: Once | INTRAMUSCULAR | Status: AC
  Administered 2015-01-05: 10 mg via INTRAVENOUS
  Filled 2015-01-04: qty 1

## 2015-01-04 MED ORDER — MEPERIDINE HCL 50 MG/ML IJ SOLN: 6.2500 mg | INTRAMUSCULAR | Status: DC | PRN

## 2015-01-04 MED ORDER — FESOTERODINE FUMARATE ER 8 MG PO TB24
8.0000 mg | ORAL_TABLET | Freq: Every day | ORAL | Status: DC
  Administered 2015-01-04 – 2015-01-05 (×2): 8 mg via ORAL
  Filled 2015-01-04 (×3): qty 1

## 2015-01-04 MED ORDER — GLYCOPYRROLATE 0.2 MG/ML IJ SOLN
INTRAMUSCULAR | Status: DC | PRN
  Administered 2015-01-04: 0.3 mg via INTRAVENOUS

## 2015-01-04 MED ORDER — CLINDAMYCIN PHOSPHATE 900 MG/50ML IV SOLN
INTRAVENOUS | Status: AC
  Filled 2015-01-04: qty 50

## 2015-01-04 MED ORDER — ENOXAPARIN SODIUM 30 MG/0.3ML ~~LOC~~ SOLN
30.0000 mg | Freq: Two times a day (BID) | SUBCUTANEOUS | Status: DC
  Administered 2015-01-05 – 2015-01-06 (×3): 30 mg via SUBCUTANEOUS
  Filled 2015-01-04 (×5): qty 0.3

## 2015-01-04 MED ORDER — ZOLPIDEM TARTRATE 5 MG PO TABS: 5.0000 mg | ORAL_TABLET | Freq: Every evening | ORAL | Status: DC | PRN

## 2015-01-04 MED ORDER — CLINDAMYCIN PHOSPHATE 900 MG/50ML IV SOLN
900.0000 mg | INTRAVENOUS | Status: AC
  Administered 2015-01-04: 900 mg via INTRAVENOUS

## 2015-01-04 MED ORDER — ACETAMINOPHEN 650 MG RE SUPP: 650.0000 mg | Freq: Four times a day (QID) | RECTAL | Status: DC | PRN

## 2015-01-04 MED ORDER — MENTHOL 3 MG MT LOZG: 1.0000 | LOZENGE | OROMUCOSAL | Status: DC | PRN

## 2015-01-04 MED ORDER — PANTOPRAZOLE SODIUM 40 MG PO TBEC
40.0000 mg | DELAYED_RELEASE_TABLET | Freq: Every day | ORAL | Status: DC
  Administered 2015-01-05 – 2015-01-06 (×2): 40 mg via ORAL
  Filled 2015-01-04 (×2): qty 1

## 2015-01-04 MED ORDER — LACTATED RINGERS IV SOLN
INTRAVENOUS | Status: DC
  Administered 2015-01-04: 1000 mL via INTRAVENOUS

## 2015-01-04 MED ORDER — HYDROMORPHONE HCL 2 MG/ML IJ SOLN
INTRAMUSCULAR | Status: AC
  Filled 2015-01-04: qty 1

## 2015-01-04 MED ORDER — NEOSTIGMINE METHYLSULFATE 10 MG/10ML IV SOLN
INTRAVENOUS | Status: DC | PRN
  Administered 2015-01-04: 2 mg via INTRAVENOUS

## 2015-01-04 MED ORDER — FUROSEMIDE 20 MG PO TABS
20.0000 mg | ORAL_TABLET | ORAL | Status: DC | PRN
  Filled 2015-01-04: qty 1

## 2015-01-04 MED ORDER — SODIUM CHLORIDE 0.9 % IJ SOLN
INTRAMUSCULAR | Status: AC
  Filled 2015-01-04: qty 10

## 2015-01-04 MED ORDER — HYDROMORPHONE HCL 1 MG/ML IJ SOLN
INTRAMUSCULAR | Status: DC | PRN
  Administered 2015-01-04: 1 mg via INTRAVENOUS
  Administered 2015-01-04: 0.5 mg via INTRAVENOUS

## 2015-01-04 MED ORDER — CISATRACURIUM BESYLATE (PF) 10 MG/5ML IV SOLN
INTRAVENOUS | Status: DC | PRN
  Administered 2015-01-04: 8 mg via INTRAVENOUS

## 2015-01-04 MED ORDER — VITAMIN C 500 MG PO TABS
500.0000 mg | ORAL_TABLET | Freq: Every day | ORAL | Status: DC
  Administered 2015-01-04 – 2015-01-05 (×2): 500 mg via ORAL
  Filled 2015-01-04 (×3): qty 1

## 2015-01-04 MED ORDER — GLYCOPYRROLATE 0.2 MG/ML IJ SOLN
INTRAMUSCULAR | Status: AC
  Filled 2015-01-04: qty 2

## 2015-01-04 MED ORDER — CALCIUM CARBONATE-VITAMIN D 250-125 MG-UNIT PO TABS: 1.0000 | ORAL_TABLET | Freq: Two times a day (BID) | ORAL | Status: DC

## 2015-01-04 MED ORDER — KETOROLAC TROMETHAMINE 30 MG/ML IJ SOLN
INTRAMUSCULAR | Status: DC | PRN
  Administered 2015-01-04: 30 mg

## 2015-01-04 MED ORDER — VITAMIN E 180 MG (400 UNIT) PO CAPS
400.0000 [IU] | ORAL_CAPSULE | Freq: Every day | ORAL | Status: DC
  Administered 2015-01-04 – 2015-01-05 (×2): 400 [IU] via ORAL
  Filled 2015-01-04 (×3): qty 1

## 2015-01-04 MED ORDER — PROPOFOL 10 MG/ML IV BOLUS
INTRAVENOUS | Status: AC
  Filled 2015-01-04: qty 20

## 2015-01-04 MED ORDER — SODIUM CHLORIDE 0.9 % IJ SOLN
INTRAMUSCULAR | Status: DC | PRN
  Administered 2015-01-04: 29 mL

## 2015-01-04 MED ORDER — MELOXICAM 15 MG PO TABS
15.0000 mg | ORAL_TABLET | Freq: Every day | ORAL | Status: DC
  Administered 2015-01-05: 15 mg via ORAL
  Filled 2015-01-04 (×2): qty 1

## 2015-01-04 MED ORDER — LACTATED RINGERS IV SOLN
INTRAVENOUS | Status: DC | PRN
  Administered 2015-01-04 (×2): via INTRAVENOUS

## 2015-01-04 MED ORDER — BUPIVACAINE-EPINEPHRINE (PF) 0.25% -1:200000 IJ SOLN
INTRAMUSCULAR | Status: AC
  Filled 2015-01-04: qty 30

## 2015-01-04 MED ORDER — POVIDONE-IODINE 7.5 % EX SOLN: Freq: Once | CUTANEOUS | Status: DC

## 2015-01-04 MED ORDER — POLYETHYLENE GLYCOL 3350 17 G PO PACK: 17.0000 g | PACK | Freq: Every day | ORAL | Status: DC | PRN

## 2015-01-04 MED ORDER — FENTANYL CITRATE 0.05 MG/ML IJ SOLN
INTRAMUSCULAR | Status: DC | PRN
  Administered 2015-01-04: 50 ug via INTRAVENOUS
  Administered 2015-01-04: 75 ug via INTRAVENOUS
  Administered 2015-01-04: 50 ug via INTRAVENOUS
  Administered 2015-01-04: 75 ug via INTRAVENOUS

## 2015-01-04 MED ORDER — SODIUM CHLORIDE 0.9 % IJ SOLN
INTRAMUSCULAR | Status: AC
  Filled 2015-01-04: qty 50

## 2015-01-04 MED ORDER — CISATRACURIUM BESYLATE 20 MG/10ML IV SOLN
INTRAVENOUS | Status: AC
  Filled 2015-01-04: qty 10

## 2015-01-04 MED ORDER — SODIUM CHLORIDE 0.9 % IV SOLN
2000.0000 mg | Freq: Once | INTRAVENOUS | Status: AC
  Administered 2015-01-04: 2000 mg via TOPICAL
  Filled 2015-01-04: qty 20

## 2015-01-04 MED ORDER — FENTANYL CITRATE 0.05 MG/ML IJ SOLN
25.0000 ug | INTRAMUSCULAR | Status: DC | PRN
  Administered 2015-01-04 (×4): 25 ug via INTRAVENOUS

## 2015-01-04 MED ORDER — ALUM & MAG HYDROXIDE-SIMETH 200-200-20 MG/5ML PO SUSP: 30.0000 mL | ORAL | Status: DC | PRN

## 2015-01-04 MED ORDER — OMEGA-3 FATTY ACIDS 1000 MG PO CAPS: 1.0000 g | ORAL_CAPSULE | Freq: Two times a day (BID) | ORAL | Status: DC

## 2015-01-04 MED ORDER — MIDAZOLAM HCL 2 MG/2ML IJ SOLN
INTRAMUSCULAR | Status: AC
  Filled 2015-01-04: qty 2

## 2015-01-04 MED ORDER — DOCUSATE SODIUM 100 MG PO CAPS
100.0000 mg | ORAL_CAPSULE | Freq: Every day | ORAL | Status: DC
  Administered 2015-01-04 – 2015-01-05 (×2): 100 mg via ORAL

## 2015-01-04 MED ORDER — LORATADINE 10 MG PO TABS
10.0000 mg | ORAL_TABLET | Freq: Every day | ORAL | Status: DC
  Administered 2015-01-05 – 2015-01-06 (×2): 10 mg via ORAL
  Filled 2015-01-04 (×2): qty 1

## 2015-01-04 MED ORDER — DIPHENHYDRAMINE HCL 12.5 MG/5ML PO ELIX: 12.5000 mg | ORAL_SOLUTION | ORAL | Status: DC | PRN

## 2015-01-04 MED ORDER — BISACODYL 5 MG PO TBEC: 5.0000 mg | DELAYED_RELEASE_TABLET | Freq: Every day | ORAL | Status: DC | PRN

## 2015-01-04 MED ORDER — IRBESARTAN 75 MG PO TABS
37.5000 mg | ORAL_TABLET | Freq: Every day | ORAL | Status: DC
  Administered 2015-01-05 – 2015-01-06 (×2): 37.5 mg via ORAL
  Filled 2015-01-04 (×2): qty 0.5

## 2015-01-04 MED ORDER — SODIUM CHLORIDE 0.9 % IR SOLN
Status: DC | PRN
  Administered 2015-01-04: 1000 mL

## 2015-01-04 MED ORDER — CHOLECALCIFEROL 10 MCG (400 UNIT) PO TABS
400.0000 [IU] | ORAL_TABLET | Freq: Two times a day (BID) | ORAL | Status: DC
  Administered 2015-01-04 – 2015-01-06 (×4): 400 [IU] via ORAL
  Filled 2015-01-04 (×5): qty 1

## 2015-01-04 MED ORDER — BUPIVACAINE-EPINEPHRINE 0.25% -1:200000 IJ SOLN
INTRAMUSCULAR | Status: DC | PRN
  Administered 2015-01-04: 30 mL

## 2015-01-04 MED ORDER — SODIUM CHLORIDE 0.9 % IV SOLN: INTRAVENOUS | Status: DC

## 2015-01-04 MED ORDER — ONDANSETRON HCL 4 MG/2ML IJ SOLN
INTRAMUSCULAR | Status: AC
  Filled 2015-01-04: qty 2

## 2015-01-04 MED ORDER — NEOSTIGMINE METHYLSULFATE 10 MG/10ML IV SOLN
INTRAVENOUS | Status: AC
  Filled 2015-01-04: qty 1

## 2015-01-04 MED ORDER — DEXAMETHASONE SODIUM PHOSPHATE 10 MG/ML IJ SOLN
INTRAMUSCULAR | Status: AC
  Filled 2015-01-04: qty 1

## 2015-01-04 MED ORDER — LIDOCAINE HCL (CARDIAC) 20 MG/ML IV SOLN
INTRAVENOUS | Status: DC | PRN
  Administered 2015-01-04: 40 mg via INTRAVENOUS

## 2015-01-04 MED ORDER — HYDROMORPHONE HCL 1 MG/ML IJ SOLN: 1.0000 mg | INTRAMUSCULAR | Status: DC | PRN

## 2015-01-04 MED ORDER — ACETAMINOPHEN 10 MG/ML IV SOLN
1000.0000 mg | Freq: Once | INTRAVENOUS | Status: AC
  Administered 2015-01-04: 1000 mg via INTRAVENOUS
  Filled 2015-01-04: qty 100

## 2015-01-04 MED ORDER — KETOROLAC TROMETHAMINE 30 MG/ML IJ SOLN: 30.0000 mg | Freq: Once | INTRAMUSCULAR | Status: DC

## 2015-01-04 MED ORDER — LIDOCAINE HCL (CARDIAC) 20 MG/ML IV SOLN
INTRAVENOUS | Status: AC
  Filled 2015-01-04: qty 5

## 2015-01-04 MED ORDER — EPHEDRINE SULFATE 50 MG/ML IJ SOLN
INTRAMUSCULAR | Status: AC
  Filled 2015-01-04: qty 1

## 2015-01-04 MED ORDER — METOCLOPRAMIDE HCL 5 MG/ML IJ SOLN: 5.0000 mg | Freq: Three times a day (TID) | INTRAMUSCULAR | Status: DC | PRN

## 2015-01-04 MED ORDER — OXYCODONE HCL 5 MG PO TABS
5.0000 mg | ORAL_TABLET | ORAL | Status: DC | PRN
  Administered 2015-01-04 – 2015-01-06 (×4): 5 mg via ORAL
  Filled 2015-01-04 (×2): qty 2
  Filled 2015-01-04: qty 1
  Filled 2015-01-04: qty 2

## 2015-01-04 SURGICAL SUPPLY — 66 items
BAG SPEC THK2 15X12 ZIP CLS (MISCELLANEOUS) ×2
BAG ZIPLOCK 12X15 (MISCELLANEOUS) ×6 IMPLANT
BANDAGE ELASTIC 4 VELCRO ST LF (GAUZE/BANDAGES/DRESSINGS) ×3 IMPLANT
BANDAGE ELASTIC 6 VELCRO ST LF (GAUZE/BANDAGES/DRESSINGS) ×3 IMPLANT
BANDAGE ESMARK 6X9 LF (GAUZE/BANDAGES/DRESSINGS) ×1 IMPLANT
BLADE SAG 18X100X1.27 (BLADE) ×3 IMPLANT
BLADE SAW SGTL 13.0X1.19X90.0M (BLADE) ×3 IMPLANT
BNDG CMPR 9X6 STRL LF SNTH (GAUZE/BANDAGES/DRESSINGS) ×1
BNDG ESMARK 6X9 LF (GAUZE/BANDAGES/DRESSINGS) ×3
CAP KNEE TOTAL 3 SIGMA ×2 IMPLANT
CEMENT HV SMART SET (Cement) ×4 IMPLANT
CUFF TOURN SGL QUICK 34 (TOURNIQUET CUFF) ×3
CUFF TRNQT CYL 34X4X40X1 (TOURNIQUET CUFF) ×1 IMPLANT
DRAPE EXTREMITY T 121X128X90 (DRAPE) ×3 IMPLANT
DRAPE POUCH INSTRU U-SHP 10X18 (DRAPES) ×3 IMPLANT
DRAPE SHEET LG 3/4 BI-LAMINATE (DRAPES) ×3 IMPLANT
DRAPE U-SHAPE 47X51 STRL (DRAPES) ×3 IMPLANT
DRSG AQUACEL AG ADV 3.5X10 (GAUZE/BANDAGES/DRESSINGS) ×3 IMPLANT
DRSG AQUACEL AG ADV 3.5X14 (GAUZE/BANDAGES/DRESSINGS) ×2 IMPLANT
DRSG TEGADERM 4X4.75 (GAUZE/BANDAGES/DRESSINGS) ×3 IMPLANT
DURAPREP 26ML APPLICATOR (WOUND CARE) ×3 IMPLANT
ELECT REM PT RETURN 9FT ADLT (ELECTROSURGICAL) ×3
ELECTRODE REM PT RTRN 9FT ADLT (ELECTROSURGICAL) ×1 IMPLANT
EVACUATOR 1/8 PVC DRAIN (DRAIN) ×3 IMPLANT
FACESHIELD WRAPAROUND (MASK) ×15 IMPLANT
FACESHIELD WRAPAROUND OR TEAM (MASK) ×5 IMPLANT
GAUZE SPONGE 2X2 8PLY STRL LF (GAUZE/BANDAGES/DRESSINGS) ×1 IMPLANT
GLOVE BIOGEL PI IND STRL 8 (GLOVE) ×2 IMPLANT
GLOVE BIOGEL PI INDICATOR 8 (GLOVE) ×4
GLOVE SURG ORTHO 8.0 STRL STRW (GLOVE) ×3 IMPLANT
GLOVE SURG ORTHO 9.0 STRL STRW (GLOVE) ×3 IMPLANT
GLOVE SURG SS PI 7.5 STRL IVOR (GLOVE) ×3 IMPLANT
GOWN STRL REUS W/TWL XL LVL3 (GOWN DISPOSABLE) ×6 IMPLANT
HANDPIECE INTERPULSE COAX TIP (DISPOSABLE) ×3
IMMOBILIZER KNEE 20 (SOFTGOODS) ×3
IMMOBILIZER KNEE 20 THIGH 36 (SOFTGOODS) ×1 IMPLANT
KIT BASIN OR (CUSTOM PROCEDURE TRAY) ×3 IMPLANT
LIQUID BAND (GAUZE/BANDAGES/DRESSINGS) ×3 IMPLANT
NDL SAFETY ECLIPSE 18X1.5 (NEEDLE) ×1 IMPLANT
NEEDLE HYPO 18GX1.5 SHARP (NEEDLE) ×3
NS IRRIG 1000ML POUR BTL (IV SOLUTION) ×3 IMPLANT
PACK TOTAL JOINT (CUSTOM PROCEDURE TRAY) ×3 IMPLANT
POSITIONER SURGICAL ARM (MISCELLANEOUS) ×3 IMPLANT
SET HNDPC FAN SPRY TIP SCT (DISPOSABLE) ×1 IMPLANT
SET PAD KNEE POSITIONER (MISCELLANEOUS) ×3 IMPLANT
SPONGE GAUZE 2X2 STER 10/PKG (GAUZE/BANDAGES/DRESSINGS) ×2
SPONGE LAP 18X18 X RAY DECT (DISPOSABLE) IMPLANT
SPONGE SURGIFOAM ABS GEL 100 (HEMOSTASIS) IMPLANT
STOCKINETTE 6  STRL (DRAPES) ×2
STOCKINETTE 6 STRL (DRAPES) ×1 IMPLANT
SUCTION FRAZIER 12FR DISP (SUCTIONS) ×3 IMPLANT
SUT BONE WAX W31G (SUTURE) IMPLANT
SUT MNCRL AB 3-0 PS2 18 (SUTURE) ×3 IMPLANT
SUT VIC AB 1 CT1 27 (SUTURE) ×12
SUT VIC AB 1 CT1 27XBRD ANTBC (SUTURE) ×4 IMPLANT
SUT VIC AB 2-0 CT1 27 (SUTURE) ×6
SUT VIC AB 2-0 CT1 TAPERPNT 27 (SUTURE) ×2 IMPLANT
SUT VLOC 180 0 24IN GS25 (SUTURE) ×3 IMPLANT
SYR 50ML LL SCALE MARK (SYRINGE) ×3 IMPLANT
TAPE STRIPS DRAPE STRL (GAUZE/BANDAGES/DRESSINGS) ×3 IMPLANT
TOWEL OR 17X26 10 PK STRL BLUE (TOWEL DISPOSABLE) ×3 IMPLANT
TOWEL OR NON WOVEN STRL DISP B (DISPOSABLE) IMPLANT
TOWER CARTRIDGE SMART MIX (DISPOSABLE) ×3 IMPLANT
TRAY FOLEY CATH 14FRSI W/METER (CATHETERS) ×3 IMPLANT
WATER STERILE IRR 1500ML POUR (IV SOLUTION) ×6 IMPLANT
WRAP KNEE MAXI GEL POST OP (GAUZE/BANDAGES/DRESSINGS) ×3 IMPLANT

## 2015-01-04 NOTE — Anesthesia Postprocedure Evaluation (Signed)
Anesthesia Post Note  Patient: Brittany Robertson  Procedure(s) Performed: Procedure(s) (LRB): LEFT TOTAL KNEE ARTHROPLASTY (Left)  Anesthesia type: General  Patient location: PACU  Post pain: Pain level controlled  Post assessment: Post-op Vital signs reviewed  Last Vitals:  Filed Vitals:   01/04/15 1800  BP: 144/72  Pulse: 78  Temp:   Resp: 15    Post vital signs: Reviewed  Level of consciousness: sedated  Complications: No apparent anesthesia complications. Patient had episode of chest pain with 100% O2sat and normal ECG.

## 2015-01-04 NOTE — Anesthesia Preprocedure Evaluation (Addendum)
Anesthesia Evaluation  Patient identified by MRN, date of birth, ID band Patient awake    Reviewed: Allergy & Precautions, H&P , NPO status , Patient's Chart, lab work & pertinent test results  Airway Mallampati: II  TM Distance: >3 FB Neck ROM: full    Dental no notable dental hx.    Pulmonary    Pulmonary exam normal       Cardiovascular hypertension, Pt. on medications + CAD negative cardio ROS  Rhythm:regular  Cath reviewed   Neuro/Psych negative neurological ROS  negative psych ROS   GI/Hepatic negative GI ROS, Neg liver ROS, GERD-  Medicated and Controlled,  Endo/Other  negative endocrine ROS  Renal/GU negative Renal ROS     Musculoskeletal   Abdominal Normal abdominal exam  (+)   Peds  Hematology negative hematology ROS (+)   Anesthesia Other Findings   Reproductive/Obstetrics                            Anesthesia Physical Anesthesia Plan  ASA: III  Anesthesia Plan: General   Post-op Pain Management:    Induction: Intravenous  Airway Management Planned: LMA and Oral ETT  Additional Equipment:   Intra-op Plan:   Post-operative Plan: Extubation in OR  Informed Consent: I have reviewed the patients History and Physical, chart, labs and discussed the procedure including the risks, benefits and alternatives for the proposed anesthesia with the patient or authorized representative who has indicated his/her understanding and acceptance.   Dental advisory given  Plan Discussed with: CRNA  Anesthesia Plan Comments:         Anesthesia Quick Evaluation

## 2015-01-04 NOTE — Op Note (Signed)
DATE OF SURGERY:  01/04/2015  TIME: 5:10 PM  PATIENT NAME:  Brittany Robertson    AGE: 74 y.o.   PRE-OPERATIVE DIAGNOSIS:  left knee oa   POST-OPERATIVE DIAGNOSIS:  left knee oa   PROCEDURE:  Procedure(s): LEFT TOTAL KNEE ARTHROPLASTY  SURGEON:  Ashtynn Berke ANDREW  ASSISTANT:  Bryson Stilwell, PA-C, present and scrubbed throughout the case, critical for assistance with exposure, retraction, instrumentation, and closure.  OPERATIVE IMPLANTS: Depuy PFC Sigma Rotating Platform.  Femur size 3, Tibia size 4, Patella size 35 3-peg oval button, with a 12.5 mm polyethylene insert.   PREOPERATIVE INDICATIONS:   Brittany Robertson is a 74 y.o. year old female with end stage bone on bone arthritis of the knee who failed conservative treatment and elected for Total Knee Arthroplasty.   The risks, benefits, and alternatives were discussed at length including but not limited to the risks of infection, bleeding, nerve injury, stiffness, blood clots, the need for revision surgery, cardiopulmonary complications, among others, and they were willing to proceed.  OPERATIVE DESCRIPTION:  The patient was brought to the operative room and placed in a supine position.  Spinal anesthesia was administered.  IV antibiotics were given.  The lower extremity was prepped and draped in the usual sterile fashion.  Time out was performed.  The leg was elevated and exsanguinated and the tourniquet was inflated.  Anterior quadriceps tendon splitting approach was performed.  The patella was retracted and osteophytes were removed.  The anterior horn of the medial and lateral meniscus was removed and cruciate ligaments resected.   The distal femur was opened with the drill and the intramedullary distal femoral cutting jig was utilized, set at 5 degrees resecting 10 mm off the distal femur.  Care was taken to protect the collateral ligaments.  The distal femoral sizing jig was applied, taking care to avoid notching.   Then the 4-in-1 cutting jig was applied and the anterior and posterior femur was cut, along with the chamfer cuts.    Then the extramedullary tibial cutting jig was utilized making the appropriate cut using the anterior tibial crest as a reference building in appropriate posterior slope.  Care was taken during the cut to protect the medial and collateral ligaments.  The proximal tibia was removed along with the posterior horns of the menisci.   The posterior medial femoral osteophytes and posterior lateral femoral osteophytes were removed.    The flexion gap was then measured and was symmetric with the extension gap, measured at 10.  I completed the distal femoral preparation using the appropriate jig to prepare the box.  The patella was then measured, and cut with the saw.    The proximal tibia sized and prepared accordingly with the reamer and the punch, and then all components were trialed with the trial insert.  The knee was found to have excellent balance and full motion.    The above named components were then cemented into place and all excess cement was removed.  The trial polyethylene component was in place during cementation, and then was exchanged for the real polyethylene component.    The knee was easily taken through a range of motion and the patella tracked well and the knee irrigated copiously and the parapatellar and subcutaneous tissue closed with vicryl, and monocryl with steri strips for the skin.  The arthrotomy was closed at 90 of flexion. The wounds were dressed with sterile gauze and the tourniquet released and the patient was awakened and returned to  the PACU in stable and satisfactory condition.  There were no complications.  Total tourniquet time was 90 minutes. 60cc saline sensorcaine epi and toradol periosteal injection. 2grams transanemic acid subcutaneous injection prior to closure.

## 2015-01-04 NOTE — Transfer of Care (Signed)
Immediate Anesthesia Transfer of Care Note  Patient: Brittany Robertson  Procedure(s) Performed: Procedure(s): LEFT TOTAL KNEE ARTHROPLASTY (Left)  Patient Location: PACU  Anesthesia Type:General  Level of Consciousness: awake, alert  and oriented  Airway & Oxygen Therapy: Patient Spontanous Breathing and Patient connected to face mask oxygen  Post-op Assessment: Report given to RN and Post -op Vital signs reviewed and stable  Post vital signs: Reviewed and stable  Last Vitals:  Filed Vitals:   01/04/15 1232  BP: 150/85  Pulse: 79  Temp: 36.4 C  Resp: 18    Complications: No apparent anesthesia complications

## 2015-01-04 NOTE — Progress Notes (Signed)
Pt reports some pain in L lower chest area, painful when pressed.  VSS as documented. NSR. Dr Seward Speck notified, MD assessed pt at bedside, ordered to transfer to floor.

## 2015-01-04 NOTE — Interval H&P Note (Signed)
History and Physical Interval Note:  01/04/2015 3:04 PM  Brittany Robertson  has presented today for surgery, with the diagnosis of left knee oa   The various methods of treatment have been discussed with the patient and family. After consideration of risks, benefits and other options for treatment, the patient has consented to  Procedure(s): LEFT TOTAL KNEE ARTHROPLASTY (Left) as a surgical intervention .  The patient's history has been reviewed, patient examined, no change in status, stable for surgery.  I have reviewed the patient's chart and labs.  Questions were answered to the patient's satisfaction.     Christian Borgerding ANDREW

## 2015-01-04 NOTE — Anesthesia Procedure Notes (Signed)
Procedure Name: Intubation Date/Time: 01/04/2015 3:20 PM Performed by: Glory Buff Pre-anesthesia Checklist: Patient identified, Emergency Drugs available, Suction available and Patient being monitored Patient Re-evaluated:Patient Re-evaluated prior to inductionOxygen Delivery Method: Circle System Utilized Preoxygenation: Pre-oxygenation with 100% oxygen Intubation Type: IV induction Ventilation: Mask ventilation without difficulty Tube type: Oral Tube size: 7.5 mm Number of attempts: 1 Airway Equipment and Method: Stylet and Oral airway Placement Confirmation: ETT inserted through vocal cords under direct vision,  positive ETCO2 and breath sounds checked- equal and bilateral Secured at: 21 cm Tube secured with: Tape Dental Injury: Teeth and Oropharynx as per pre-operative assessment

## 2015-01-05 LAB — BASIC METABOLIC PANEL
ANION GAP: 9 (ref 5–15)
BUN: 20 mg/dL (ref 6–23)
CO2: 24 mmol/L (ref 19–32)
Calcium: 8.3 mg/dL — ABNORMAL LOW (ref 8.4–10.5)
Chloride: 105 mmol/L (ref 96–112)
Creatinine, Ser: 0.76 mg/dL (ref 0.50–1.10)
GFR calc Af Amer: >90 mL/min (ref >90)
GFR, EST NON AFRICAN AMERICAN: 80 mL/min — AB (ref >90)
Glucose, Bld: 161 mg/dL — ABNORMAL HIGH (ref 70–99)
Potassium: 4.3 mmol/L (ref 3.5–5.1)
Sodium: 138 mmol/L (ref 135–145)

## 2015-01-05 LAB — CBC
HCT: 32.6 % — ABNORMAL LOW (ref 36.0–46.0)
Hemoglobin: 10.9 g/dL — ABNORMAL LOW (ref 12.0–15.0)
MCH: 32.5 pg (ref 26.0–34.0)
MCHC: 33.4 g/dL (ref 30.0–36.0)
MCV: 97.3 fL (ref 78.0–100.0)
Platelets: 180 10*3/uL (ref 150–400)
RBC: 3.35 MIL/uL — ABNORMAL LOW (ref 3.87–5.11)
RDW: 14.7 % (ref 11.5–15.5)
WBC: 12.2 10*3/uL — AB (ref 4.0–10.5)

## 2015-01-05 NOTE — Progress Notes (Signed)
Physical Therapy Treatment Patient Details Name: Brittany Robertson MRN: [id number] DOB: 1940-11-28 Today's Date: 01/05/2015    History of Present Illness L TKR    PT Comments    Steady progress with mobility but pt continues ltd by fatigue  Follow Up Recommendations  Home health PT     Equipment Recommendations  Rolling walker with 5" wheels    Recommendations for Other Services OT consult     Precautions / Restrictions Precautions Precautions: Knee;Fall Required Braces or Orthoses: Knee Immobilizer - Left Knee Immobilizer - Left: Discontinue once straight leg raise with < 10 degree lag (pt performed IND SLR) Restrictions Weight Bearing Restrictions: No Other Position/Activity Restrictions: WBAT    Mobility  Bed Mobility Overal bed mobility: Needs Assistance Bed Mobility: Supine to Sit;Sit to Supine     Supine to sit: Min assist Sit to supine: Min assist   General bed mobility comments: cues for sequence and use of R LE to self assist  Transfers Overall transfer level: Needs assistance Equipment used: Rolling walker (2 wheeled) Transfers: Sit to/from Stand Sit to Stand: Min assist         General transfer comment: cues for LE management and use of UEs to self assist  Ambulation/Gait Ambulation/Gait assistance: Min assist Ambulation Distance (Feet): 52 Feet (and 19) Assistive device: Rolling walker (2 wheeled) Gait Pattern/deviations: Step-to pattern;Decreased step length - right;Decreased step length - left;Shuffle;Trunk flexed Gait velocity: decr   General Gait Details: cues for sequence, posture and position from [employer]            Wheelchair Mobility    Modified Rankin (Stroke Patients Only)       Balance                                    Cognition Arousal/Alertness: Awake/alert Behavior During Therapy: WFL for tasks assessed/performed Overall Cognitive Status: Within Functional Limits for tasks assessed                       Exercises      General Comments        Pertinent Vitals/Pain Pain Assessment: 0-10 Pain Score: 3  Pain Location: L knee` Pain Descriptors / Indicators: Aching;Sore Pain Intervention(s): Limited activity within patient's tolerance;Monitored during session;Premedicated before session;Ice applied    Home Living                      Prior Function            PT Goals (current goals can now be found in the care plan section) Acute Rehab PT Goals Patient Stated Goal: Resume previous lifestyle with decreased pain PT Goal Formulation: With patient Time For Goal Achievement: 01/12/15 Potential to Achieve Goals: Good Progress towards PT goals: Progressing toward goals    Frequency  7X/week    PT Plan Current plan remains appropriate    Co-evaluation             End of Session Equipment Utilized During Treatment: Gait belt Activity Tolerance: Patient tolerated treatment well Patient left: in bed;with call bell/phone within reach;with family/visitor present     Time: 1400-1425 PT Time Calculation (min) (ACUTE ONLY): 25 min  Charges:  $Gait Training: 23-37 mins                    G Codes:  Keondra Haydu 01/05/2015, 2:40 PM

## 2015-01-05 NOTE — Progress Notes (Signed)
OT Cancellation Note  Patient Details Name: Brittany Robertson MRN: [id number] DOB: 28-Oct-1940   Cancelled Treatment:    Reason Eval/Treat Not Completed: Other (comment) Pt back to bed after 2nd PT session. Will check on pt in morning for OT eval  Nikko Goldwire, Thereasa Parkin 01/05/2015, 3:16 PM

## 2015-01-05 NOTE — Evaluation (Signed)
Physical Therapy Evaluation Patient Details Name: Brittany Robertson MRN: [id number] DOB: 20-Feb-1941 Today's Date: 01/05/2015   History of Present Illness  L TKR  Clinical Impression  Pt s/p L TKR with decreased L LE strength/ROM and post op pain limiting functional mobility.  Pt should progress to dc home with family assist and HHPT follow up.    Follow Up Recommendations Home health PT    Equipment Recommendations  Rolling walker with 5" wheels    Recommendations for Other Services OT consult     Precautions / Restrictions Precautions Precautions: Knee;Fall Required Braces or Orthoses: Knee Immobilizer - Left Knee Immobilizer - Left: Discontinue once straight leg raise with < 10 degree lag (Pt performed IND SLR this am) Restrictions Weight Bearing Restrictions: No Other Position/Activity Restrictions: WBAT      Mobility  Bed Mobility Overal bed mobility: Needs Assistance Bed Mobility: Supine to Sit     Supine to sit: Min assist     General bed mobility comments: cues for sequence and use of R LE to self assist  Transfers Overall transfer level: Needs assistance Equipment used: Rolling walker (2 wheeled) Transfers: Sit to/from Stand Sit to Stand: Min assist         General transfer comment: cues for LE management and use of UEs to self assist  Ambulation/Gait Ambulation/Gait assistance: Min assist Ambulation Distance (Feet): 24 Feet Assistive device: Rolling walker (2 wheeled) Gait Pattern/deviations: Step-to pattern Gait velocity: decr   General Gait Details: cues for sequence, posture and position from [employer]            Wheelchair Mobility    Modified Rankin (Stroke Patients Only)       Balance                                             Pertinent Vitals/Pain Pain Assessment: 0-10 Pain Score: 4  Pain Location: L knee Pain Descriptors / Indicators: Aching;Sore Pain Intervention(s): Limited activity within  patient's tolerance;Monitored during session;Premedicated before session;Ice applied    Home Living Family/patient expects to be discharged to:: Private residence Living Arrangements: Spouse/significant other Available Help at Discharge: Family Type of Home: House Home Access: Stairs to enter Entrance Stairs-Rails: None Entrance Stairs-Number of Steps: 1+2 Home Layout: One level Home Equipment: Cane - single point      Prior Function Level of Independence: Independent;Independent with assistive device(s)               Hand Dominance   Dominant Hand: Right    Extremity/Trunk Assessment   Upper Extremity Assessment: Overall WFL for tasks assessed           Lower Extremity Assessment: LLE deficits/detail   LLE Deficits / Details: Quad strength 3/5 with AAROM at knee -10-  70     Communication   Communication: No difficulties  Cognition Arousal/Alertness: Awake/alert Behavior During Therapy: WFL for tasks assessed/performed Overall Cognitive Status: Within Functional Limits for tasks assessed                      General Comments      Exercises Total Joint Exercises Ankle Circles/Pumps: AROM;Both;15 reps;Supine Quad Sets: AROM;Both;10 reps;Supine Heel Slides: AAROM;Left;15 reps;Supine Straight Leg Raises: AAROM;AROM;Left;15 reps;Supine      Assessment/Plan    PT Assessment Patient needs continued PT services  PT Diagnosis Difficulty walking  PT Problem List Decreased strength;Decreased range of motion;Decreased activity tolerance;Decreased mobility;Decreased knowledge of use of DME;Pain  PT Treatment Interventions DME instruction;Gait training;Stair training;Functional mobility training;Therapeutic activities;Therapeutic exercise;Patient/family education   PT Goals (Current goals can be found in the Care Plan section) Acute Rehab PT Goals Patient Stated Goal: Resume previous lifestyle with decreased pain PT Goal Formulation: With patient Time  For Goal Achievement: 01/12/15 Potential to Achieve Goals: Good    Frequency 7X/week   Barriers to discharge        Co-evaluation               End of Session Equipment Utilized During Treatment: Gait belt Activity Tolerance: Patient tolerated treatment well Patient left: in chair;with call bell/phone within reach;with family/visitor present Nurse Communication: Mobility status         Time: 0912-0958 PT Time Calculation (min) (ACUTE ONLY): 46 min   Charges:   PT Evaluation $Initial PT Evaluation Tier I: 1 Procedure PT Treatments $Gait Training: 8-22 mins $Therapeutic Exercise: 8-22 mins   PT G Codes:        Zada Haser 27-Jan-2015, 12:12 PM

## 2015-01-05 NOTE — Progress Notes (Signed)
   Subjective: 1 Day Post-Op Procedure(s) (LRB): LEFT TOTAL KNEE ARTHROPLASTY (Left) Patient reports pain as mild.   We will start therapy today.  Plan is to go Home after hospital stay.  Objective: Vital signs in last 24 hours: Temp:  [97.5 F (36.4 C)-98.1 F (36.7 C)] 97.9 F (36.6 C) (03/19 0645) Pulse Rate:  [74-82] 74 (03/19 0645) Resp:  [11-18] 16 (03/19 0645) BP: (103-150)/(50-85) 106/50 mmHg (03/19 0645) SpO2:  [97 %-100 %] 98 % (03/19 0645) Weight:  [100.5 kg (221 lb 9 oz)] 100.5 kg (221 lb 9 oz) (03/18 1307)  Intake/Output from previous day:  Intake/Output Summary (Last 24 hours) at 01/05/15 0814 Last data filed at 01/05/15 0649  Gross per 24 hour  Intake   1800 ml  Output   1300 ml  Net    500 ml    Intake/Output this shift:    Labs:  Recent Labs  01/05/15 0530  HGB 10.9*    Recent Labs  01/05/15 0530  WBC 12.2*  RBC 3.35*  HCT 32.6*  PLT 180    Recent Labs  01/05/15 0530  NA 138  K 4.3  CL 105  CO2 24  BUN 20  CREATININE 0.76  GLUCOSE 161*  CALCIUM 8.3*   No results for input(s): LABPT, INR in the last 72 hours.  EXAM General - Patient is Alert, Appropriate and Oriented Extremity - Neurologically intact Neurovascular intact No cellulitis present Compartment soft Dressing - dressing C/D/I Motor Function - intact, moving foot and toes well on exam.  Hemovac pulled without difficulty.  Past Medical History  Diagnosis Date  . Hypertension   . Degenerative disk disease   . Obesity   . Multiple fractures of ribs of left side   . Bilateral contusions of knee   . Hyperlipidemia   . GERD (gastroesophageal reflux disease)   . OA (osteoarthritis)   . Osteopenia   . PVC's (premature ventricular contractions)   . Coronary artery disease     pci 2006, repeat cath 30% mid LAD distal to stent otherwise normal coronary arteries, cath 08/2013 with patent LAD stent and otherwise normal coronary arteries with diastolic dysfunction  . DOE  (dyspnea on exertion)     chronic secondary to obesity, deconditiong and chronic diastolic CHF  . Chronic diastolic CHF (congestive heart failure)   . Complication of anesthesia     usually drops BP during surgery    Assessment/Plan: 1 Day Post-Op Procedure(s) (LRB): LEFT TOTAL KNEE ARTHROPLASTY (Left) Active Problems:   Primary osteoarthritis of left knee   S/P total knee arthroplasty   Advance diet Up with therapy D/C IV fluids Plan for discharge tomorrow Discharge home with home health  Weight-Bearing as tolerated to left leg   Brittany Robertson V 01/05/2015, 8:14 AM

## 2015-01-06 LAB — CBC
HCT: 30.4 % — ABNORMAL LOW (ref 36.0–46.0)
Hemoglobin: 10.1 g/dL — ABNORMAL LOW (ref 12.0–15.0)
MCH: 32.3 pg (ref 26.0–34.0)
MCHC: 33.2 g/dL (ref 30.0–36.0)
MCV: 97.1 fL (ref 78.0–100.0)
Platelets: 189 10*3/uL (ref 150–400)
RBC: 3.13 MIL/uL — AB (ref 3.87–5.11)
RDW: 15.4 % (ref 11.5–15.5)
WBC: 13.1 10*3/uL — ABNORMAL HIGH (ref 4.0–10.5)

## 2015-01-06 MED ORDER — ASPIRIN EC 325 MG PO TBEC: 325.0000 mg | DELAYED_RELEASE_TABLET | Freq: Two times a day (BID) | ORAL | Status: DC

## 2015-01-06 MED ORDER — BACLOFEN 10 MG PO TABS: 10.0000 mg | ORAL_TABLET | Freq: Three times a day (TID) | ORAL | Status: DC | PRN

## 2015-01-06 MED ORDER — OXYCODONE-ACETAMINOPHEN 5-325 MG PO TABS: 1.0000 | ORAL_TABLET | ORAL | Status: DC | PRN

## 2015-01-06 MED ORDER — FERROUS SULFATE 325 (65 FE) MG PO TABS
325.0000 mg | ORAL_TABLET | Freq: Three times a day (TID) | ORAL | Status: DC
Start: 2015-01-06 — End: 2016-04-24

## 2015-01-06 NOTE — Progress Notes (Signed)
Subjective: 2 Days Post-Op Procedure(s) (LRB): LEFT TOTAL KNEE ARTHROPLASTY (Left) Patient reports pain as mild to left knee.  Tolertaing PO's well. Progressing with PT. Denies SOB, CP, or calf pain. No N/V.  Objective: Vital signs in last 24 hours: Temp:  [97.6 F (36.4 C)-98.8 F (37.1 C)] 97.9 F (36.6 C) (03/20 0636) Pulse Rate:  [73-78] 77 (03/20 0636) Resp:  [16] 16 (03/20 0636) BP: (129-140)/(62-73) 139/73 mmHg (03/20 0636) SpO2:  [96 %-100 %] 100 % (03/20 0636)  Intake/Output from previous day: 03/19 0701 - 03/20 0700 In: 480 [P.O.:480] Out: 600 [Urine:600] Intake/Output this shift:     Recent Labs  01/05/15 0530 01/06/15 0450  HGB 10.9* 10.1*    Recent Labs  01/05/15 0530 01/06/15 0450  WBC 12.2* 13.1*  RBC 3.35* 3.13*  HCT 32.6* 30.4*  PLT 180 189    Recent Labs  01/05/15 0530  NA 138  K 4.3  CL 105  CO2 24  BUN 20  CREATININE 0.76  GLUCOSE 161*  CALCIUM 8.3*   No results for input(s): LABPT, INR in the last 72 hours.  Alert and oriented x3. RRR, Lungs clear, BS x4. Left Calf soft and non tender. L knee dressing C/D/I. No DVT signs. No signs of infection or compartment syndrome. LLE neurovascularly intact.   Assessment/Plan: 2 Days Post-Op Procedure(s) (LRB): LEFT TOTAL KNEE ARTHROPLASTY (Left) Up with PT OT eval Ace d/c'ed D/c home today with family Follow up in office in 2 weeks Follow instructions  Nuel Dejaynes L 01/06/2015, 8:06 AM

## 2015-01-06 NOTE — Progress Notes (Signed)
Physical Therapy Treatment Patient Details Name: Brittany Robertson MRN: [id number] DOB: 07/02/1941 Today's Date: 01/06/2015    History of Present Illness L TKR    PT Comments    Pt motivated and progressing with mobility including negotiating stairs this session with assist of spouse.  Pt fatigued and with c/o mild dizziness following stairs - BP 108/65 - RN aware.  Follow Up Recommendations  Home health PT     Equipment Recommendations  Rolling walker with 5" wheels    Recommendations for Other Services OT consult     Precautions / Restrictions Precautions Precautions: Knee;Fall Required Braces or Orthoses: Knee Immobilizer - Left (Pt performed IND SLR this date) Knee Immobilizer - Left: Discontinue once straight leg raise with < 10 degree lag Restrictions Weight Bearing Restrictions: No Other Position/Activity Restrictions: WBAT    Mobility  Bed Mobility Overal bed mobility: Needs Assistance Bed Mobility: Sit to Supine     Supine to sit: Supervision Sit to supine: Supervision   General bed mobility comments: pt in chair  Transfers Overall transfer level: Needs assistance Equipment used: Rolling walker (2 wheeled) Transfers: Sit to/from Stand Sit to Stand: Supervision Stand pivot transfers: Supervision       General transfer comment: min cues for LE management and use of UEs to self assist  Ambulation/Gait Ambulation/Gait assistance: Min guard;Supervision Ambulation Distance (Feet): 45 Feet (and 18'x2) Assistive device: Rolling walker (2 wheeled) Gait Pattern/deviations: Step-to pattern;Decreased step length - right;Decreased step length - left;Shuffle;Trunk flexed Gait velocity: decr   General Gait Details: min cues for sequence, posture and position from RW   Stairs Stairs: Yes Stairs assistance: Min assist Stair Management: No rails;Step to pattern;Backwards;With walker Number of Stairs: 4 General stair comments: 2 steps twice with husband  assisting on second attempt and written instructions provided  Wheelchair Mobility    Modified Rankin (Stroke Patients Only)       Balance                                    Cognition Arousal/Alertness: Awake/alert Behavior During Therapy: WFL for tasks assessed/performed Overall Cognitive Status: Within Functional Limits for tasks assessed                      Exercises Total Joint Exercises Ankle Circles/Pumps: AROM;Both;Supine;20 reps Quad Sets: AROM;Both;Supine;20 reps Heel Slides: AAROM;Left;Supine;20 reps Straight Leg Raises: AAROM;AROM;Left;Supine;20 reps    General Comments        Pertinent Vitals/Pain Pain Assessment: 0-10 Pain Score: 3  Pain Location: L knee Pain Descriptors / Indicators: Aching;Sore Pain Intervention(s): Limited activity within patient's tolerance;Monitored during session;Ice applied    Home Living                      Prior Function            PT Goals (current goals can now be found in the care plan section) Acute Rehab PT Goals Patient Stated Goal: Resume previous lifestyle with decreased pain PT Goal Formulation: With patient Time For Goal Achievement: 01/12/15 Potential to Achieve Goals: Good Progress towards PT goals: Progressing toward goals    Frequency  7X/week    PT Plan Current plan remains appropriate    Co-evaluation             End of Session Equipment Utilized During Treatment: Gait belt Activity Tolerance: Patient tolerated treatment well Patient left: with  call bell/phone within reach;with family/visitor present;in bed     Time: 3716-9678 PT Time Calculation (min) (ACUTE ONLY): 30 min  Charges:  $Gait Training: 8-22 mins $Therapeutic Exercise: 8-22 mins $Therapeutic Activity: 8-22 mins                    G Codes:      [name] 2015/01/23, 1:02 PM

## 2015-01-06 NOTE — Care Management Note (Signed)
CARE MANAGEMENT NOTE 01/06/2015  Patient:  Brittany Robertson, Brittany Robertson   Account Number:  [id number]  Date Initiated:  01/05/2015  Documentation initiated by:  [facility]  Subjective/Objective Assessment:   LEFT TOTAL KNEE ARTHROPLASTY     Action/Plan:   Anticipated DC Date:     Anticipated DC Plan:  [place]  CM consult      Choice offered to / List presented to:             Status of service:  Completed, signed off Medicare Important Message given?   (If response is "NO", the following Medicare IM given date fields will be blank) Date Medicare IM given:   Medicare IM given by:   Date Additional Medicare IM given:   Additional Medicare IM given by:    Discharge Disposition:  [place]  Per UR Regulation:    If discussed at Long Length of Stay Meetings, dates discussed:    Comments:  01/06/15 - CM spoke with patient via telephone. Patient needs a rolling walker has a 3N1.[facility] at [facility] notified of the DME request and will deliver prior to discharge. Venita Sheffield RN BSN CCM [phone]  01/05/2015 1830 [employer] arranged for [place]. Jonnie Finner RN CCM Case Mgmt phone [phone]

## 2015-01-06 NOTE — Discharge Summary (Signed)
Physician Discharge Summary  Patient ID: Brittany Robertson MRN: [id number] DOB/AGE: January 15, 1941 74 y.o.  Admit date: 01/04/2015 Discharge date: 01/06/2015  Admission Diagnoses: Left knee primary OA  Discharge Diagnoses:  Active Problems:   Primary osteoarthritis of left knee   S/P total knee arthroplasty   Discharged Condition:good  Hospital Course:  Brittany Robertson is a 74 y.o. who was admitted to [facility]. They were brought to the operating room on 01/04/2015 and underwent Procedure(s): LEFT TOTAL KNEE ARTHROPLASTY.  Patient tolerated the procedure well and was later transferred to the recovery room and then to the orthopaedic floor for postoperative care.  They were given PO and IV analgesics for pain control following their surgery.  They were given 24 hours of postoperative antibiotics of  Anti-infectives    Start     Dose/Rate Route Frequency Ordered Stop   01/05/15 0600  clindamycin (CLEOCIN) IVPB 900 mg     900 mg 100 mL/hr over 30 Minutes Intravenous On call to O.R. 01/04/15 1234 01/04/15 1554   01/04/15 2200  clindamycin (CLEOCIN) IVPB 600 mg     600 mg 100 mL/hr over 30 Minutes Intravenous Every 6 hours 01/04/15 1955 01/05/15 0417     and started on DVT prophylaxis in the form of lovenox.   PT and OT were ordered for total joint protocol.  Discharge planning consulted to help with postop disposition and equipment needs.  Patient had a good night on the evening of surgery and started to get up OOB with therapy on day one.  Hemovac drain was pulled without difficulty.  Continued to work with therapy into day two.  Dressing was WNL.  The patient had progressed with therapy and meeting their goals.   Patient was seen in rounds and was ready to go home.  Consults: N/A  Significant Diagnostic Studies:routine  Treatments:routine Discharge Exam: Blood pressure 139/73, pulse 77, temperature 97.9 F (36.6 C), temperature source Oral, resp. rate 16, height 5\' 4"  (1.626  m), weight 100.5 kg (221 lb 9 oz), SpO2 100 %. Alert and oriented x3. RRR, Lungs clear, BS x4. Left Calf soft and non tender. L knee dressing C/D/I. No DVT signs. No signs of infection or compartment syndrome. LLE neurovascularly intact.   Disposition: 01-Home or Self Care  Discharge Instructions    Call MD / Call 911    Complete by:  As directed   If you experience chest pain or shortness of breath, CALL 911 and be transported to the hospital emergency room.  If you develope a fever above 101 F, pus (white drainage) or increased drainage or redness at the wound, or calf pain, call your surgeon's office.     Constipation Prevention    Complete by:  As directed   Drink plenty of fluids.  Prune juice may be helpful.  You may use a stool softener, such as Colace (over the counter) 100 mg twice a day.  Use MiraLax (over the counter) for constipation as needed.     Diet - low sodium heart healthy    Complete by:  As directed      Discharge instructions    Complete by:  As directed   Call and make an appointment for 2 weeks from now [phone]). Leave dressing in place, may remove the drain site dressing on the side. Place a band aid and neosporin over the site. May shower. Start home vs Rehab PT as directed. Follow D/C instructions. Take medications as directed. Weight bear as  tolerated with assistive devices.     Do not put a pillow under the knee. Place it under the heel.    Complete by:  As directed      Increase activity slowly as tolerated    Complete by:  As directed      TED hose    Complete by:  As directed   Use stockings (TED hose) for 2 weeks on both leg(s).  You may remove them at night for sleeping.            Medication List    STOP taking these medications        aspirin 81 MG tablet  Replaced by:  aspirin EC 325 MG tablet      TAKE these medications        aspirin EC 325 MG tablet  Take 1 tablet (325 mg total) by mouth 2 (two) times daily.     atorvastatin 20 MG  tablet  Commonly known as:  LIPITOR  Take 1 tablet (20 mg total) by mouth daily. BRAND NAME ONLY due to ALLERGY     baclofen 10 MG tablet  Commonly known as:  LIORESAL  Take 1 tablet (10 mg total) by mouth 3 (three) times daily as needed for muscle spasms.     BREATHE RIGHT SMALL/MEDIUM Strp  1 each by Does not apply route at bedtime as needed (congestion).     CALCIUM + D PO  Take 1 tablet by mouth 2 (two) times daily. Every other day- three times weekly.     cetirizine 10 MG tablet  Commonly known as:  ZYRTEC  Take 10 mg by mouth every morning.     cholecalciferol 400 UNITS Tabs tablet  Commonly known as:  VITAMIN D  Take 400 Units by mouth 2 (two) times daily.     denosumab 60 MG/ML Soln injection  Commonly known as:  PROLIA  Inject 60 mg into the skin every 6 (six) months. Administer in upper arm, thigh, or abdomen     docusate sodium 100 MG capsule  Commonly known as:  COLACE  Take 100 mg by mouth every evening.     esomeprazole 40 MG capsule  Commonly known as:  NEXIUM  Take 40 mg by mouth daily before breakfast.     ezetimibe 10 MG tablet  Commonly known as:  ZETIA  Take 1 tablet (10 mg total) by mouth every evening.     ferrous sulfate 325 (65 FE) MG tablet  Take 1 tablet (325 mg total) by mouth 3 (three) times daily after meals.     fish oil-omega-3 fatty acids 1000 MG capsule  Take 1 g by mouth 2 (two) times daily.     FLEX-A-MIN JOINT FLEX Tabs  Take 1 tablet by mouth 2 (two) times daily.     furosemide 20 MG tablet  Commonly known as:  LASIX  Take 20 mg by mouth as needed for fluid or edema.     meloxicam 15 MG tablet  Commonly known as:  MOBIC  Take 15 mg by mouth every evening.     metoprolol succinate 25 MG 24 hr tablet  Commonly known as:  TOPROL-XL  Take 1 tablet (25 mg total) by mouth daily.     nitroGLYCERIN 0.4 MG SL tablet  Commonly known as:  NITROSTAT  Place 1 tablet (0.4 mg total) under the tongue every 5 (five) minutes as needed.  As needed for chest pain x 3 doses  olmesartan 40 MG tablet  Commonly known as:  BENICAR  Take 1 tablet (40 mg total) by mouth daily.     oxyCODONE-acetaminophen 5-325 MG per tablet  Commonly known as:  ROXICET  Take 1-2 tablets by mouth every 4 (four) hours as needed for severe pain.     potassium chloride SA 20 MEQ tablet  Commonly known as:  K-DUR,KLOR-CON  Take 1 tablet (20 mEq total) by mouth every morning.     sodium chloride 0.65 % Soln nasal spray  Commonly known as:  OCEAN  Place 1 spray into both nostrils daily as needed for congestion.     SYSTANE ULTRA OP  Apply 1 drop to eye 2 (two) times daily as needed (dry eyes).     TOVIAZ 8 MG Tb24 tablet  Generic drug:  fesoterodine  Take 8 mg by mouth at bedtime.     vitamin C 500 MG tablet  Commonly known as:  ASCORBIC ACID  Take 500 mg by mouth at bedtime.     vitamin E 400 UNIT capsule  Take 400 Units by mouth at bedtime.           Follow-up Information    Follow up with Cynda Familia, MD. Schedule an appointment as soon as possible for a visit in 2 weeks.   Specialty:  Orthopedic Surgery   Contact information:   [address] [place] [address] [phone]       Signed: Lajean Manes 01/06/2015, 8:14 AM

## 2015-01-06 NOTE — Progress Notes (Signed)
Physical Therapy Treatment Patient Details Name: Brittany Robertson MRN: [id number] DOB: [birth year] Today's Date: 01/06/2015    History of Present Illness L TKR    PT Comments    Pt progressing well and likely will dc this date  Follow Up Recommendations  Home health PT     Equipment Recommendations  Rolling walker with 5" wheels    Recommendations for Other Services OT consult     Precautions / Restrictions Precautions Precautions: Knee;Fall Required Braces or Orthoses: Knee Immobilizer - Left Knee Immobilizer - Left: Discontinue once straight leg raise with < 10 degree lag (pt performed IND SLR) Restrictions Weight Bearing Restrictions: No Other Position/Activity Restrictions: WBAT    Mobility  Bed Mobility Overal bed mobility: Needs Assistance Bed Mobility: Supine to Sit     Supine to sit: Supervision     General bed mobility comments: pt in chair  Transfers Overall transfer level: Needs assistance Equipment used: Rolling walker (2 wheeled) Transfers: Sit to/from [facility] Sit to Stand: Supervision Stand pivot transfers: Supervision       General transfer comment: min cues for LE management and use of UEs to self assist  Ambulation/Gait Ambulation/Gait assistance: Min guard;Supervision Ambulation Distance (Feet): 60 Feet Assistive device: Rolling walker (2 wheeled) Gait Pattern/deviations: Step-to pattern;Decreased step length - right;Decreased step length - left;Shuffle;Trunk flexed Gait velocity: decr   General Gait Details: cues for sequence, posture and position from [employer]            Wheelchair Mobility    Modified Rankin (Stroke Patients Only)       Balance                                    Cognition Arousal/Alertness: Awake/alert Behavior During Therapy: WFL for tasks assessed/performed Overall Cognitive Status: Within Functional Limits for tasks assessed                       Exercises Total Joint Exercises Ankle Circles/Pumps: AROM;Both;Supine;20 reps Quad Sets: AROM;Both;Supine;20 reps Heel Slides: AAROM;Left;Supine;20 reps Straight Leg Raises: AAROM;AROM;Left;Supine;20 reps    General Comments        Pertinent Vitals/Pain Pain Assessment: 0-10 Pain Score: 3  Pain Location: l knee Pain Descriptors / Indicators: Sore Pain Intervention(s): Limited activity within patient's tolerance;Monitored during session;Repositioned;Ice applied    Home Living                      Prior Function            PT Goals (current goals can now be found in the care plan section) Acute Rehab PT Goals Patient Stated Goal: Resume previous lifestyle with decreased pain PT Goal Formulation: With patient Time For Goal Achievement: 01/12/15 Potential to Achieve Goals: Good Progress towards PT goals: Progressing toward goals    Frequency  7X/week    PT Plan Current plan remains appropriate    Co-evaluation             End of Session Equipment Utilized During Treatment: Gait belt Activity Tolerance: Patient tolerated treatment well Patient left: in chair;with call bell/phone within reach;with family/visitor present     Time: 1696-7893 PT Time Calculation (min) (ACUTE ONLY): 34 min  Charges:  $Gait Training: 8-22 mins $Therapeutic Exercise: 8-22 mins  G Codes:      Brittany Robertson 13-Jan-2015, 11:21 AM

## 2015-01-06 NOTE — Evaluation (Signed)
  Occupational Therapy Evaluation Patient Details Name: Brittany Robertson MRN: [id number] DOB: 1941/07/08 Today's Date: 2015/01/09    History of Present Illness L TKR   Clinical Impression   Pt ready for DC from OT standpoint. Education complete   Follow Up Recommendations  No OT follow up    Equipment Recommendations  None recommended by OT    Recommendations for Other Services       Precautions / Restrictions Precautions Precautions: Knee;Fall Required Braces or Orthoses: Knee Immobilizer - Left Knee Immobilizer - Left: Discontinue once straight leg raise with < 10 degree lag (pt performed IND SLR) Restrictions Weight Bearing Restrictions: No Other Position/Activity Restrictions: WBAT      Mobility Bed Mobility               General bed mobility comments: pt in chair  Transfers Overall transfer level: Needs assistance Equipment used: Rolling walker (2 wheeled) Transfers: Sit to/from [facility] Sit to Stand: Supervision Stand pivot transfers: Supervision            Balance                                            ADL Overall ADL's : Needs assistance/impaired Eating/Feeding: Modified independent;Sitting   Grooming: Sitting;Set up   Upper Body Bathing: Set up;Sitting   Lower Body Bathing: Set up;Sit to/from stand   Upper Body Dressing : Set up;Sitting   Lower Body Dressing: Minimal assistance;Sit to/from stand       Toileting- Water quality scientist and Hygiene: Sit to/from stand;Set up       Functional mobility during ADLs: Supervision/safety;Rolling walker General ADL Comments: educated on on safety with ADL activity, walker safety and functional mobility               Pertinent Vitals/Pain Pain Score: 3  Pain Location: l knee Pain Descriptors / Indicators: Sore Pain Intervention(s): Limited activity within patient's tolerance;Monitored during session;Repositioned;Ice applied     Hand  Dominance     Extremity/Trunk Assessment Upper Extremity Assessment Upper Extremity Assessment: Overall WFL for tasks assessed           Communication     Cognition Arousal/Alertness: Awake/alert Behavior During Therapy: WFL for tasks assessed/performed Overall Cognitive Status: Within Functional Limits for tasks assessed                     General Comments   husband will a with shoes as pt wears high tops                             OT Goals(Current goals can be found in the care plan section) Acute Rehab OT Goals Patient Stated Goal: Resume previous lifestyle with decreased pain  OT Frequency:     Barriers to D/C:               End of Session Equipment Utilized During Treatment: Rolling walker Nurse Communication: Mobility status  Activity Tolerance: Patient tolerated treatment well Patient left: in chair   Time: 2620-3559 OT Time Calculation (min): 20 min Charges:  OT General Charges $OT Visit: 1 Procedure OT Evaluation $Initial OT Evaluation Tier I: 1 Procedure G-Codes:    Betsy Pries 01/09/2015, 10:29 AM

## 2015-01-06 NOTE — Progress Notes (Signed)
Pt became light-headed when doing stairs with PT. Afterward had BP check & reading was 108/65 w/pulse 91. B.Stilwell, PA, notified, no new orders. Will discharge pt today. Avelina Mcclurkin, [employer]

## 2015-01-06 NOTE — Progress Notes (Signed)
Discharged from floor via w/c, spouse & belongings with pt. No changes in assessment. Brittany Robertson, [employer]

## 2015-01-06 NOTE — Plan of Care (Signed)
Problem: Discharge Progression Outcomes Goal: Anticoagulant follow-up in place Outcome: Not Applicable Date Met:  61/53/79 asa

## 2015-01-07 ENCOUNTER — Encounter (HOSPITAL_COMMUNITY): Payer: Self-pay | Admitting: Specialist

## 2015-01-07 NOTE — Progress Notes (Signed)
Utilization review completed.  

## 2015-03-15 ENCOUNTER — Telehealth: Payer: Self-pay

## 2015-03-15 NOTE — Telephone Encounter (Signed)
Called patient to let her know that i placed samples up front for her

## 2015-03-29 ENCOUNTER — Other Ambulatory Visit: Payer: Self-pay | Admitting: Orthopedic Surgery

## 2015-04-17 NOTE — H&P (Signed)
TOTAL KNEE ADMISSION H&P  Patient is being admitted for right total knee arthroplasty.  Subjective:  Chief Complaint:right knee pain.  HPI: Brittany Robertson, 74 y.o. female, has a history of pain and functional disability in the right knee due to arthritis and has failed non-surgical conservative treatments for greater than 12 weeks to includeNSAID's and/or analgesics, corticosteriod injections, viscosupplementation injections, supervised PT with diminished ADL's post treatment, use of assistive devices, weight reduction as appropriate and activity modification.  Onset of symptoms was gradual, starting 4 years ago with gradually worsening course since that time. The patient noted prior procedures on the knee to include  arthroscopy on the right knee(s).  Patient currently rates pain in the right knee(s) at 5 out of 10 with activity. Patient has worsening of pain with activity and weight bearing, pain with passive range of motion and joint swelling.  Patient has evidence of subchondral sclerosis, joint subluxation and joint space narrowing by imaging studies. This patient has had Osteoarthritis. There is no active infection.  Patient Active Problem List   Diagnosis Date Noted  . Primary osteoarthritis of left knee 01/04/2015  . S/P total knee arthroplasty 01/04/2015  . Chronic diastolic CHF (congestive heart failure)   . CAD (coronary artery disease) 09/19/2013  . DOE (dyspnea on exertion) 08/15/2013  . Dyslipidemia, goal LDL below 70 08/15/2013  . PVC's (premature ventricular contractions)   . Fall 09/28/2011  . Multiple fractures of ribs of left side 09/28/2011  . Bilateral contusions of knee 09/28/2011  . Obesity 09/28/2011  . Hypertension   . Degenerative disk disease    Past Medical History  Diagnosis Date  . Hypertension   . Degenerative disk disease   . Obesity   . Multiple fractures of ribs of left side   . Bilateral contusions of knee   . Hyperlipidemia   . GERD  (gastroesophageal reflux disease)   . OA (osteoarthritis)   . Osteopenia   . PVC's (premature ventricular contractions)   . Coronary artery disease     pci 2006, repeat cath 30% mid LAD distal to stent otherwise normal coronary arteries, cath 08/2013 with patent LAD stent and otherwise normal coronary arteries with diastolic dysfunction  . DOE (dyspnea on exertion)     chronic secondary to obesity, deconditiong and chronic diastolic CHF  . Chronic diastolic CHF (congestive heart failure)   . Complication of anesthesia     usually drops BP during surgery    Past Surgical History  Procedure Laterality Date  . Knee arthroscopy    . Stents    . Left heart catheterization with coronary angiogram N/A 09/19/2013    Procedure: LEFT HEART CATHETERIZATION WITH CORONARY ANGIOGRAM;  Surgeon: Sueanne Margarita, MD;  Location: [facility];  Service: Cardiovascular;  Laterality: N/A;  . Tonsillectomy      as child  . Appendectomy  1957  . Hemorrhoid surgery  1969  . Dilation and curettage of uterus  1972  . Abdominal hysterectomy    . Cholecystectomy  1988  . Tear duct surgery  1990's    bilateral  . Cyst removal hand  2000  . Arthroscopic knee  05/2002,04/07/2005    right and then also torn muscle surgery on right knee (2003(  . Arthroscopic knee  01/02/2005,09/21/2006    left  . Coronary angioplasty  02/05/2004  . Eye surgery  01/2007    bilateral cataract with lens implant  . Hernia repair  02/2010    incisional hernioa  . Hand  surgery  05/29/2011    joint removed  . Neck injections  03/2014    back and neck injections from Dr. Nelva Bush  . Foot surgery  01/2012    left and right bunion and hammer toe  . Colonoscopy  2010    with Endo  . Total knee arthroplasty Left 01/04/2015    Procedure: LEFT TOTAL KNEE ARTHROPLASTY;  Surgeon: Sydnee Cabal, MD;  Location: [facility];  Service: Orthopedics;  Laterality: Left;    No prescriptions prior to admission   Allergies  Allergen Reactions  .  Atorvastatin Other (See Comments)    GENERIC causes severe skin rash but can take Brand Name  . Codeine Nausea And Vomiting  . Sulfa Antibiotics Hives and Itching  . Adhesive [Tape] Itching, Rash and Other (See Comments)    Skin irritation  . Penicillins Itching and Rash    History  Substance Use Topics  . Smoking status: Never Smoker   . Smokeless tobacco: Not on file  . Alcohol Use: 0.6 oz/week    1 Glasses of wine per week     Comment: occassionally    Family History  Problem Relation Age of Onset  . Hypertension Mother   . Cancer Mother   . Hypertension Father   . Heart Problems Father      Review of Systems  Constitutional: Negative.   HENT: Negative.   Respiratory: Negative.   Gastrointestinal: Negative.   Genitourinary: Negative.   Musculoskeletal: Positive for joint pain.  Skin: Negative.   Neurological: Negative.   Endo/Heme/Allergies: Negative.   Psychiatric/Behavioral: Negative.     Objective:  Physical Exam  Vital signs in last 24 hours:    Labs:   Estimated body mass index is 38.02 kg/(m^2) as calculated from the following:   Height as of 01/04/15: 5\' 4"  (1.626 m).   Weight as of 12/28/14: 100.517 kg (221 lb 9.6 oz).   Imaging Review Plain radiographs demonstrate severe degenerative joint disease of the right knee(s). The overall alignment issignificant varus. The bone quality appears to be good for age and reported activity level.  Assessment/Plan:  End stage arthritis, right knee   The patient history, physical examination, clinical judgment of the provider and imaging studies are consistent with end stage degenerative joint disease of the right knee(s) and total knee arthroplasty is deemed medically necessary. The treatment options including medical management, injection therapy arthroscopy and arthroplasty were discussed at length. The risks and benefits of total knee arthroplasty were presented and reviewed. The risks due to aseptic  loosening, infection, stiffness, patella tracking problems, thromboembolic complications and other imponderables were discussed. The patient acknowledged the explanation, agreed to proceed with the plan and consent was signed. Patient is being admitted for inpatient treatment for surgery, pain control, PT, OT, prophylactic antibiotics, VTE prophylaxis, progressive ambulation and ADL's and discharge planning. The patient is planning to be discharged home with home health services

## 2015-05-01 ENCOUNTER — Other Ambulatory Visit (HOSPITAL_COMMUNITY): Payer: Self-pay | Admitting: *Deleted

## 2015-05-01 NOTE — Patient Instructions (Addendum)
YOUR PROCEDURE IS SCHEDULED ON :  05/10/15  REPORT TO [facility] MAIN ENTRANCE FOLLOW SIGNS TO EAST ELEVATOR - GO TO 3rd FLOOR CHECK IN AT 3 EAST NURSES STATION (SHORT STAY) AT:  11:00  CALL THIS NUMBER IF YOU HAVE PROBLEMS THE MORNING OF SURGERY [phone]  REMEMBER:ONLY 1 PER PERSON MAY GO TO SHORT STAY WITH YOU TO GET READY THE MORNING OF YOUR SURGERY  DO NOT EAT FOOD  AFTER MIDNIGHT  MAY HAVE CLEAR LIQUIDS UNTIL 7:00 AM  TAKE THESE MEDICINES THE MORNING OF SURGERY: ZYRTEC / NEXIUM / METOPROLOL     CLEAR LIQUID DIET   Foods Allowed                                                                     Foods Excluded  Coffee and tea, regular and decaf                             liquids that you cannot  Plain Jell-O in any flavor                                             see through such as: Fruit ices (not with fruit pulp)                                     milk, soups, orange juice  Iced Popsicles                                    All solid food Carbonated beverages, regular and diet                                    Cranberry, grape and apple juices Sports drinks like Gatorade Lightly seasoned clear broth or consume(fat free) Sugar, honey syrup _____________________________________________________________________  YOU MAY NOT HAVE ANY METAL ON YOUR BODY INCLUDING HAIR PINS AND PIERCING'S. DO NOT WEAR JEWELRY, MAKEUP, LOTIONS, POWDERS OR PERFUMES. DO NOT WEAR NAIL POLISH. DO NOT SHAVE 48 HRS PRIOR TO SURGERY. MEN MAY SHAVE FACE AND NECK.  DO NOT [place]. [facility] IS NOT RESPONSIBLE FOR VALUABLES.  CONTACTS, DENTURES OR PARTIALS MAY NOT BE WORN TO SURGERY. LEAVE SUITCASE IN CAR. CAN BE BROUGHT TO ROOM AFTER SURGERY.  PATIENTS DISCHARGED THE DAY OF SURGERY WILL NOT BE ALLOWED TO DRIVE HOME.  PLEASE READ OVER THE FOLLOWING INSTRUCTION  SHEETS _________________________________________________________________________________                                          [facility] - PREPARING FOR SURGERY  Before surgery, you can play an important role.  Because skin is not sterile, your skin needs to be as free of germs as possible.  You can reduce the number of germs on your skin by washing with CHG (chlorahexidine gluconate) soap before surgery.  CHG is an antiseptic cleaner which kills germs and bonds with the skin to continue killing germs even after washing. Please DO NOT use if you have an allergy to CHG or antibacterial soaps.  If your skin becomes reddened/irritated stop using the CHG and inform your nurse when you arrive at Short Stay. Do not shave (including legs and underarms) for at least 48 hours prior to the first CHG shower.  You may shave your face. Please follow these instructions carefully:   1.  Shower with CHG Soap the night before surgery and the  morning of Surgery.   2.  If you choose to wash your hair, wash your hair first as usual with your  normal  Shampoo.   3.  After you shampoo, rinse your hair and body thoroughly to remove the  shampoo.                                         4.  Use CHG as you would any other liquid soap.  You can apply chg directly  to the skin and wash . Gently wash with scrungie or clean wascloth    5.  Apply the CHG Soap to your body ONLY FROM THE NECK DOWN.   Do not use on open                           Wound or open sores. Avoid contact with eyes, ears mouth and genitals (private parts).                        Genitals (private parts) with your normal soap.              6.  Wash thoroughly, paying special attention to the area where your surgery  will be performed.   7.  Thoroughly rinse your body with warm water from the neck down.   8.  DO NOT shower/wash with your normal soap after using and rinsing off  the CHG Soap .                9.  Pat yourself dry with a clean  towel.             10.  Wear clean night clothes to bed after shower             11.  Place clean sheets on your bed the night of your first shower and do not  sleep with pets.  Day of Surgery : Do not apply any lotions/deodorants the morning of surgery.  Please wear clean clothes to the hospital/surgery center.  FAILURE TO FOLLOW THESE INSTRUCTIONS MAY RESULT IN THE CANCELLATION OF YOUR SURGERY    PATIENT SIGNATURE_________________________________  ______________________________________________________________________     Brittany Robertson  An incentive spirometer is a tool that can help keep your lungs clear and active. This tool measures how well you are filling your lungs with each breath. Taking long deep breaths may help reverse or decrease the chance of developing breathing (pulmonary) problems (especially infection) following:  A long period of time when you are unable to move or be active. BEFORE THE PROCEDURE   If the spirometer includes an indicator to show your  best effort, your nurse or respiratory therapist will set it to a desired goal.  If possible, sit up straight or lean slightly forward. Try not to slouch.  Hold the incentive spirometer in an upright position. INSTRUCTIONS FOR USE  1. Sit on the edge of your bed if possible, or sit up as far as you can in bed or on a chair. 2. Hold the incentive spirometer in an upright position. 3. Breathe out normally. 4. Place the mouthpiece in your mouth and seal your lips tightly around it. 5. Breathe in slowly and as deeply as possible, raising the piston or the ball toward the top of the column. 6. Hold your breath for 3-5 seconds or for as long as possible. Allow the piston or ball to fall to the bottom of the column. 7. Remove the mouthpiece from your mouth and breathe out normally. 8. Rest for a few seconds and repeat Steps 1 through 7 at least 10 times every 1-2 hours when you are awake. Take your time and take  a few normal breaths between deep breaths. 9. The spirometer may include an indicator to show your best effort. Use the indicator as a goal to work toward during each repetition. 10. After each set of 10 deep breaths, practice coughing to be sure your lungs are clear. If you have an incision (the cut made at the time of surgery), support your incision when coughing by placing a pillow or rolled up towels firmly against it. Once you are able to get out of bed, walk around indoors and cough well. You may stop using the incentive spirometer when instructed by your caregiver.  RISKS AND COMPLICATIONS  Take your time so you do not get dizzy or light-headed.  If you are in pain, you may need to take or ask for pain medication before doing incentive spirometry. It is harder to take a deep breath if you are having pain. AFTER USE  Rest and breathe slowly and easily.  It can be helpful to keep track of a log of your progress. Your caregiver can provide you with a simple table to help with this. If you are using the spirometer at home, follow these instructions: [place] IF:   You are having difficultly using the spirometer.  You have trouble using the spirometer as often as instructed.  Your pain medication is not giving enough relief while using the spirometer.  You develop fever of 100.5 F (38.1 C) or higher. SEEK IMMEDIATE MEDICAL CARE IF:   You cough up bloody sputum that had not been present before.  You develop fever of 102 F (38.9 C) or greater.  You develop worsening pain at or near the incision site. MAKE SURE YOU:   Understand these instructions.  Will watch your condition.  Will get help right away if you are not doing well or get worse. Document Released: 02/15/2007 Document Revised: 12/28/2011 Document Reviewed: 04/18/2007 [facility] Patient Information 2014 [place], [place].   ________________________________________________________________________

## 2015-05-03 ENCOUNTER — Encounter (HOSPITAL_COMMUNITY): Payer: Self-pay

## 2015-05-03 ENCOUNTER — Encounter (HOSPITAL_COMMUNITY)
Admission: RE | Admit: 2015-05-03 | Discharge: 2015-05-03 | Disposition: A | Discharge status: Home / Self Care | Payer: Medicare Other | Source: Ambulatory Visit | Attending: Specialist | Admitting: Specialist

## 2015-05-03 DIAGNOSIS — Z01812 Encounter for preprocedural laboratory examination: Secondary | ICD-10-CM | POA: Insufficient documentation

## 2015-05-03 DIAGNOSIS — M1711 Unilateral primary osteoarthritis, right knee: Secondary | ICD-10-CM | POA: Diagnosis not present

## 2015-05-03 HISTORY — DX: Spontaneous ecchymoses: R23.3

## 2015-05-03 HISTORY — DX: Other cervical disc degeneration, unspecified cervical region: M50.30

## 2015-05-03 HISTORY — DX: Other skin changes: R23.8

## 2015-05-03 LAB — CBC
HCT: 41.4 % (ref 36.0–46.0)
Hemoglobin: 13.4 g/dL (ref 12.0–15.0)
MCH: 30.9 pg (ref 26.0–34.0)
MCHC: 32.4 g/dL (ref 30.0–36.0)
MCV: 95.6 fL (ref 78.0–100.0)
Platelets: 174 10*3/uL (ref 150–400)
RBC: 4.33 MIL/uL (ref 3.87–5.11)
RDW: 13.7 % (ref 11.5–15.5)
WBC: 7.8 10*3/uL (ref 4.0–10.5)

## 2015-05-03 LAB — SURGICAL PCR SCREEN
MRSA, PCR: NEGATIVE
STAPHYLOCOCCUS AUREUS: NEGATIVE

## 2015-05-03 LAB — URINALYSIS, ROUTINE W REFLEX MICROSCOPIC
BILIRUBIN URINE: NEGATIVE
GLUCOSE, UA: NEGATIVE mg/dL
KETONES UR: NEGATIVE mg/dL
Nitrite: NEGATIVE
PH: 6 (ref 5.0–8.0)
PROTEIN: NEGATIVE mg/dL
Specific Gravity, Urine: 1.025 (ref 1.005–1.030)
Urobilinogen, UA: 0.2 mg/dL (ref 0.0–1.0)

## 2015-05-03 LAB — BASIC METABOLIC PANEL
ANION GAP: 6 (ref 5–15)
BUN: 22 mg/dL — AB (ref 6–20)
CO2: 26 mmol/L (ref 22–32)
CREATININE: 0.76 mg/dL (ref 0.44–1.00)
Calcium: 9.1 mg/dL (ref 8.9–10.3)
Chloride: 107 mmol/L (ref 101–111)
GFR calc Af Amer: >60 mL/min (ref >60)
GFR calc non Af Amer: >60 mL/min (ref >60)
Glucose, Bld: 101 mg/dL — ABNORMAL HIGH (ref 65–99)
Potassium: 4.7 mmol/L (ref 3.5–5.1)
Sodium: 139 mmol/L (ref 135–145)

## 2015-05-03 LAB — APTT: aPTT: 27 seconds (ref 24–37)

## 2015-05-03 LAB — PROTIME-INR
INR: 1.01 (ref 0.00–1.49)
PROTHROMBIN TIME: 13.5 s (ref 11.6–15.2)

## 2015-05-03 LAB — URINE MICROSCOPIC-ADD ON

## 2015-05-03 NOTE — Progress Notes (Signed)
Abnormal UA faxed to Dr. Theda Sers

## 2015-05-03 NOTE — Progress Notes (Signed)
   05/03/15 0908  OBSTRUCTIVE SLEEP APNEA  Have you ever been diagnosed with sleep apnea through a sleep study? No  Do you snore loudly (loud enough to be heard through closed doors)?  0  Do you often feel tired, fatigued, or sleepy during the daytime? 0  Has anyone observed you stop breathing during your sleep? 0  Do you have, or are you being treated for high blood pressure? 1  BMI more than 35 kg/m2? 1  Age over 76 years old? 1  Neck circumference greater than 40 cm/16 inches? 1  Gender: 0

## 2015-05-09 MED ORDER — SODIUM CHLORIDE 0.9 % IV SOLN
1500.0000 mg | INTRAVENOUS | Status: AC
  Administered 2015-05-10: 1500 mg via INTRAVENOUS
  Filled 2015-05-09: qty 1500

## 2015-05-09 NOTE — Anesthesia Preprocedure Evaluation (Addendum)
Anesthesia Evaluation  Patient identified by MRN, date of birth, ID band Patient awake    Reviewed: Allergy & Precautions, NPO status , Patient's Chart, lab work & pertinent test results  History of Anesthesia Complications (+) history of anesthetic complications ("slow to emerge")  Airway Mallampati: II  TM Distance: >3 FB Neck ROM: Full    Dental no notable dental hx. (+) Dental Advisory Given, Poor Dentition   Pulmonary neg pulmonary ROS,  breath sounds clear to auscultation  Pulmonary exam normal       Cardiovascular hypertension, Pt. on medications and Pt. on home beta blockers + CAD, + Cardiac Stents and +CHF (diastolic dysfunction) Normal cardiovascular examRhythm:Regular Rate:Normal  ASCAD with recent cath showing patent LAD stent and otherwise normal coronary arteries per cardiology visit   Neuro/Psych negative neurological ROS  negative psych ROS   GI/Hepatic Neg liver ROS, GERD-  Medicated and Controlled,  Endo/Other  negative endocrine ROS  Renal/GU negative Renal ROS  negative genitourinary   Musculoskeletal  (+) Arthritis -, Osteoarthritis,    Abdominal   Peds negative pediatric ROS (+)  Hematology negative hematology ROS (+)   Anesthesia Other Findings   Reproductive/Obstetrics negative OB ROS                            Anesthesia Physical Anesthesia Plan  ASA: III  Anesthesia Plan: General   Post-op Pain Management:    Induction: Intravenous  Airway Management Planned:   Additional Equipment:   Intra-op Plan:   Post-operative Plan: Extubation in OR  Informed Consent: I have reviewed the patients History and Physical, chart, labs and discussed the procedure including the risks, benefits and alternatives for the proposed anesthesia with the patient or authorized representative who has indicated his/her understanding and acceptance.   Dental advisory  given  Plan Discussed with: CRNA  Anesthesia Plan Comments: (Patient would prefer not to do the spinal and proceed with general anesthesia after all questions and concerns addressed)       Anesthesia Quick Evaluation

## 2015-05-10 ENCOUNTER — Inpatient Hospital Stay (HOSPITAL_COMMUNITY): Payer: Medicare Other | Admitting: Anesthesiology

## 2015-05-10 ENCOUNTER — Inpatient Hospital Stay (HOSPITAL_COMMUNITY)
Admission: RE | Admit: 2015-05-10 | Discharge: 2015-05-12 | DRG: 470 | Disposition: A | Discharge status: Home Health | Payer: Medicare Other | Source: Ambulatory Visit | Attending: Specialist | Admitting: Specialist

## 2015-05-10 ENCOUNTER — Encounter (HOSPITAL_COMMUNITY): Payer: Self-pay

## 2015-05-10 ENCOUNTER — Encounter (HOSPITAL_COMMUNITY)
Admission: RE | Disposition: A | Discharge status: Home Health | Payer: Self-pay | Source: Ambulatory Visit | Attending: Specialist

## 2015-05-10 DIAGNOSIS — I10 Essential (primary) hypertension: Secondary | ICD-10-CM | POA: Diagnosis present

## 2015-05-10 DIAGNOSIS — I5032 Chronic diastolic (congestive) heart failure: Secondary | ICD-10-CM | POA: Diagnosis present

## 2015-05-10 DIAGNOSIS — Z96659 Presence of unspecified artificial knee joint: Secondary | ICD-10-CM

## 2015-05-10 DIAGNOSIS — M1711 Unilateral primary osteoarthritis, right knee: Principal | ICD-10-CM | POA: Diagnosis present

## 2015-05-10 DIAGNOSIS — K219 Gastro-esophageal reflux disease without esophagitis: Secondary | ICD-10-CM | POA: Diagnosis present

## 2015-05-10 DIAGNOSIS — Z01812 Encounter for preprocedural laboratory examination: Secondary | ICD-10-CM

## 2015-05-10 DIAGNOSIS — M25561 Pain in right knee: Secondary | ICD-10-CM | POA: Diagnosis present

## 2015-05-10 DIAGNOSIS — M8580 Other specified disorders of bone density and structure, unspecified site: Secondary | ICD-10-CM | POA: Diagnosis present

## 2015-05-10 DIAGNOSIS — I2510 Atherosclerotic heart disease of native coronary artery without angina pectoris: Secondary | ICD-10-CM | POA: Diagnosis present

## 2015-05-10 DIAGNOSIS — E785 Hyperlipidemia, unspecified: Secondary | ICD-10-CM | POA: Diagnosis present

## 2015-05-10 HISTORY — DX: Nausea with vomiting, unspecified: R11.2

## 2015-05-10 HISTORY — DX: Other specified postprocedural states: Z98.890

## 2015-05-10 HISTORY — PX: TOTAL KNEE ARTHROPLASTY: SHX125

## 2015-05-10 LAB — TYPE AND SCREEN
ABO/RH(D): O POS
Antibody Screen: NEGATIVE

## 2015-05-10 SURGERY — ARTHROPLASTY, KNEE, TOTAL
Anesthesia: General | Site: Knee | Laterality: Right

## 2015-05-10 MED ORDER — OXYCODONE-ACETAMINOPHEN 5-325 MG PO TABS: 1.0000 | ORAL_TABLET | ORAL | Status: DC | PRN

## 2015-05-10 MED ORDER — BISACODYL 5 MG PO TBEC: 5.0000 mg | DELAYED_RELEASE_TABLET | Freq: Every day | ORAL | Status: DC | PRN

## 2015-05-10 MED ORDER — PROPOFOL 10 MG/ML IV BOLUS
INTRAVENOUS | Status: AC
  Filled 2015-05-10: qty 20

## 2015-05-10 MED ORDER — CEPASTAT 14.5 MG MT LOZG
1.0000 | LOZENGE | OROMUCOSAL | Status: DC | PRN
  Filled 2015-05-10: qty 9

## 2015-05-10 MED ORDER — CALCIUM CARBONATE 1250 (500 CA) MG PO TABS
1.0000 | ORAL_TABLET | ORAL | Status: DC
  Administered 2015-05-11 (×2): 500 mg via ORAL
  Filled 2015-05-10 (×4): qty 1

## 2015-05-10 MED ORDER — ROCURONIUM BROMIDE 100 MG/10ML IV SOLN
INTRAVENOUS | Status: DC | PRN
  Administered 2015-05-10: 20 mg via INTRAVENOUS

## 2015-05-10 MED ORDER — CALCIUM-VITAMIN D-VITAMIN K 500-1000-40 MG-UNT-MCG PO CHEW: CHEWABLE_TABLET | Freq: Two times a day (BID) | ORAL | Status: DC

## 2015-05-10 MED ORDER — ZOLPIDEM TARTRATE 5 MG PO TABS: 5.0000 mg | ORAL_TABLET | Freq: Every evening | ORAL | Status: DC | PRN

## 2015-05-10 MED ORDER — HYDROMORPHONE HCL 1 MG/ML IJ SOLN
1.0000 mg | INTRAMUSCULAR | Status: DC | PRN
  Filled 2015-05-10: qty 1

## 2015-05-10 MED ORDER — FENTANYL CITRATE (PF) 250 MCG/5ML IJ SOLN
INTRAMUSCULAR | Status: AC
  Filled 2015-05-10: qty 5

## 2015-05-10 MED ORDER — MIRABEGRON ER 25 MG PO TB24
25.0000 mg | ORAL_TABLET | Freq: Every evening | ORAL | Status: DC
  Administered 2015-05-10: 25 mg via ORAL
  Filled 2015-05-10 (×4): qty 1

## 2015-05-10 MED ORDER — ATORVASTATIN CALCIUM 20 MG PO TABS
20.0000 mg | ORAL_TABLET | Freq: Every day | ORAL | Status: DC
  Administered 2015-05-11: 20 mg via ORAL

## 2015-05-10 MED ORDER — FENTANYL CITRATE (PF) 250 MCG/5ML IJ SOLN
INTRAMUSCULAR | Status: DC | PRN
  Administered 2015-05-10 (×3): 50 ug via INTRAVENOUS
  Administered 2015-05-10: 25 ug via INTRAVENOUS
  Administered 2015-05-10 (×2): 50 ug via INTRAVENOUS
  Administered 2015-05-10: 25 ug via INTRAVENOUS
  Administered 2015-05-10: 50 ug via INTRAVENOUS

## 2015-05-10 MED ORDER — DEXAMETHASONE SODIUM PHOSPHATE 10 MG/ML IJ SOLN
10.0000 mg | Freq: Once | INTRAMUSCULAR | Status: AC
  Administered 2015-05-11: 10 mg via INTRAVENOUS
  Filled 2015-05-10: qty 1

## 2015-05-10 MED ORDER — METOCLOPRAMIDE HCL 10 MG PO TABS: 5.0000 mg | ORAL_TABLET | Freq: Three times a day (TID) | ORAL | Status: DC | PRN

## 2015-05-10 MED ORDER — ACETAMINOPHEN 650 MG RE SUPP: 650.0000 mg | Freq: Four times a day (QID) | RECTAL | Status: DC | PRN

## 2015-05-10 MED ORDER — GLYCOPYRROLATE 0.2 MG/ML IJ SOLN
INTRAMUSCULAR | Status: DC | PRN
Start: 2015-05-10 — End: 2015-05-10
  Administered 2015-05-10: 0.6 mg via INTRAVENOUS

## 2015-05-10 MED ORDER — FLEX-A-MIN JOINT FLEX PO TABS: 1.0000 | ORAL_TABLET | Freq: Two times a day (BID) | ORAL | Status: DC

## 2015-05-10 MED ORDER — SALINE SPRAY 0.65 % NA SOLN: 1.0000 | Freq: Every day | NASAL | Status: DC | PRN

## 2015-05-10 MED ORDER — MENTHOL 3 MG MT LOZG
1.0000 | LOZENGE | OROMUCOSAL | Status: DC | PRN
  Administered 2015-05-10: 3 mg via ORAL

## 2015-05-10 MED ORDER — SODIUM CHLORIDE 0.9 % IJ SOLN
INTRAMUSCULAR | Status: DC | PRN
  Administered 2015-05-10: 30 mL

## 2015-05-10 MED ORDER — ACETAMINOPHEN 325 MG PO TABS: 650.0000 mg | ORAL_TABLET | Freq: Four times a day (QID) | ORAL | Status: DC | PRN

## 2015-05-10 MED ORDER — VITAMIN E 180 MG (400 UNIT) PO CAPS
400.0000 [IU] | ORAL_CAPSULE | Freq: Every day | ORAL | Status: DC
  Administered 2015-05-10 – 2015-05-11 (×2): 400 [IU] via ORAL
  Filled 2015-05-10 (×3): qty 1

## 2015-05-10 MED ORDER — POLYVINYL ALCOHOL 1.4 % OP SOLN
1.0000 [drp] | Freq: Two times a day (BID) | OPHTHALMIC | Status: DC | PRN
  Filled 2015-05-10: qty 15

## 2015-05-10 MED ORDER — MELOXICAM 15 MG PO TABS
15.0000 mg | ORAL_TABLET | Freq: Every evening | ORAL | Status: DC
  Administered 2015-05-10 – 2015-05-11 (×2): 15 mg via ORAL
  Filled 2015-05-10 (×3): qty 1

## 2015-05-10 MED ORDER — POVIDONE-IODINE 7.5 % EX SOLN: Freq: Once | CUTANEOUS | Status: DC

## 2015-05-10 MED ORDER — POLYETHYL GLYCOL-PROPYL GLYCOL 0.4-0.3 % OP SOLN: Freq: Two times a day (BID) | OPHTHALMIC | Status: DC | PRN

## 2015-05-10 MED ORDER — FERROUS SULFATE 325 (65 FE) MG PO TABS
325.0000 mg | ORAL_TABLET | Freq: Three times a day (TID) | ORAL | Status: DC
  Administered 2015-05-11 – 2015-05-12 (×4): 325 mg via ORAL
  Filled 2015-05-10 (×7): qty 1

## 2015-05-10 MED ORDER — ONDANSETRON HCL 4 MG/2ML IJ SOLN: 4.0000 mg | Freq: Once | INTRAMUSCULAR | Status: DC | PRN

## 2015-05-10 MED ORDER — DEXAMETHASONE SODIUM PHOSPHATE 10 MG/ML IJ SOLN: 10.0000 mg | Freq: Once | INTRAMUSCULAR | Status: DC

## 2015-05-10 MED ORDER — HYDROMORPHONE HCL 1 MG/ML IJ SOLN
INTRAMUSCULAR | Status: AC
  Administered 2015-05-10: 1 mg
  Filled 2015-05-10: qty 1

## 2015-05-10 MED ORDER — SODIUM CHLORIDE 0.9 % IJ SOLN
INTRAMUSCULAR | Status: AC
  Filled 2015-05-10: qty 50

## 2015-05-10 MED ORDER — DENOSUMAB 60 MG/ML ~~LOC~~ SOLN: 60.0000 mg | SUBCUTANEOUS | Status: DC

## 2015-05-10 MED ORDER — KETOROLAC TROMETHAMINE 30 MG/ML IJ SOLN
INTRAMUSCULAR | Status: AC
  Filled 2015-05-10: qty 1

## 2015-05-10 MED ORDER — PROPOFOL 10 MG/ML IV BOLUS
INTRAVENOUS | Status: DC | PRN
  Administered 2015-05-10: 120 mg via INTRAVENOUS

## 2015-05-10 MED ORDER — SODIUM CHLORIDE 0.9 % IR SOLN
Status: DC | PRN
  Administered 2015-05-10: 1000 mL

## 2015-05-10 MED ORDER — POLYETHYLENE GLYCOL 3350 17 G PO PACK: 17.0000 g | PACK | Freq: Every day | ORAL | Status: DC | PRN

## 2015-05-10 MED ORDER — FENTANYL CITRATE (PF) 100 MCG/2ML IJ SOLN
INTRAMUSCULAR | Status: AC
  Filled 2015-05-10: qty 2

## 2015-05-10 MED ORDER — DOCUSATE SODIUM 100 MG PO CAPS
100.0000 mg | ORAL_CAPSULE | Freq: Two times a day (BID) | ORAL | Status: DC
  Administered 2015-05-10 – 2015-05-11 (×3): 100 mg via ORAL

## 2015-05-10 MED ORDER — BUPIVACAINE-EPINEPHRINE (PF) 0.25% -1:200000 IJ SOLN
INTRAMUSCULAR | Status: AC
  Filled 2015-05-10: qty 30

## 2015-05-10 MED ORDER — POTASSIUM CHLORIDE IN NACL 20-0.9 MEQ/L-% IV SOLN
INTRAVENOUS | Status: DC
  Administered 2015-05-10: 21:00:00 via INTRAVENOUS
  Filled 2015-05-10 (×4): qty 1000

## 2015-05-10 MED ORDER — ONDANSETRON HCL 4 MG/2ML IJ SOLN
INTRAMUSCULAR | Status: AC
  Filled 2015-05-10: qty 2

## 2015-05-10 MED ORDER — DEXAMETHASONE SODIUM PHOSPHATE 10 MG/ML IJ SOLN
INTRAMUSCULAR | Status: DC | PRN
  Administered 2015-05-10: 10 mg via INTRAVENOUS

## 2015-05-10 MED ORDER — COENZYME Q10 LIPOSOMAL 100 MG/ML PO LIQD: 100.0000 mg | Freq: Every evening | ORAL | Status: DC

## 2015-05-10 MED ORDER — NEOSTIGMINE METHYLSULFATE 10 MG/10ML IV SOLN
INTRAVENOUS | Status: AC
  Filled 2015-05-10: qty 1

## 2015-05-10 MED ORDER — ROCURONIUM BROMIDE 100 MG/10ML IV SOLN
INTRAVENOUS | Status: AC
  Filled 2015-05-10: qty 1

## 2015-05-10 MED ORDER — OMEGA-3 FATTY ACIDS 1000 MG PO CAPS: 1.0000 g | ORAL_CAPSULE | Freq: Two times a day (BID) | ORAL | Status: DC

## 2015-05-10 MED ORDER — FUROSEMIDE 20 MG PO TABS
20.0000 mg | ORAL_TABLET | ORAL | Status: DC | PRN
  Filled 2015-05-10: qty 1

## 2015-05-10 MED ORDER — DEXAMETHASONE SODIUM PHOSPHATE 10 MG/ML IJ SOLN
INTRAMUSCULAR | Status: AC
Start: 2015-05-10 — End: 2015-05-10
  Filled 2015-05-10: qty 1

## 2015-05-10 MED ORDER — VANCOMYCIN HCL IN DEXTROSE 1-5 GM/200ML-% IV SOLN
1000.0000 mg | Freq: Two times a day (BID) | INTRAVENOUS | Status: AC
  Administered 2015-05-11: 1000 mg via INTRAVENOUS
  Filled 2015-05-10: qty 200

## 2015-05-10 MED ORDER — HYDROMORPHONE HCL 1 MG/ML IJ SOLN
INTRAMUSCULAR | Status: AC
  Filled 2015-05-10: qty 1

## 2015-05-10 MED ORDER — METHOCARBAMOL 500 MG PO TABS
500.0000 mg | ORAL_TABLET | Freq: Four times a day (QID) | ORAL | Status: DC | PRN
  Administered 2015-05-10 – 2015-05-12 (×4): 500 mg via ORAL
  Filled 2015-05-10 (×4): qty 1

## 2015-05-10 MED ORDER — LORATADINE 10 MG PO TABS
10.0000 mg | ORAL_TABLET | Freq: Every day | ORAL | Status: DC
  Administered 2015-05-11: 10 mg via ORAL
  Filled 2015-05-10 (×2): qty 1

## 2015-05-10 MED ORDER — POTASSIUM CHLORIDE CRYS ER 20 MEQ PO TBCR
20.0000 meq | EXTENDED_RELEASE_TABLET | Freq: Every day | ORAL | Status: DC
  Administered 2015-05-11: 20 meq via ORAL
  Filled 2015-05-10 (×2): qty 1

## 2015-05-10 MED ORDER — IRBESARTAN 75 MG PO TABS
37.5000 mg | ORAL_TABLET | Freq: Every day | ORAL | Status: DC
  Filled 2015-05-10 (×2): qty 0.5

## 2015-05-10 MED ORDER — METOCLOPRAMIDE HCL 5 MG/ML IJ SOLN
5.0000 mg | Freq: Three times a day (TID) | INTRAMUSCULAR | Status: DC | PRN
Start: 2015-05-10 — End: 2015-05-12

## 2015-05-10 MED ORDER — PHENOL 1.4 % MT LIQD: 1.0000 | OROMUCOSAL | Status: DC | PRN

## 2015-05-10 MED ORDER — EZETIMIBE 10 MG PO TABS
10.0000 mg | ORAL_TABLET | Freq: Every evening | ORAL | Status: DC
  Administered 2015-05-10 – 2015-05-11 (×2): 10 mg via ORAL
  Filled 2015-05-10 (×3): qty 1

## 2015-05-10 MED ORDER — FENTANYL CITRATE (PF) 100 MCG/2ML IJ SOLN
25.0000 ug | INTRAMUSCULAR | Status: DC | PRN
  Administered 2015-05-10 (×2): 50 ug via INTRAVENOUS
  Administered 2015-05-10 (×2): 25 ug via INTRAVENOUS

## 2015-05-10 MED ORDER — HYDROMORPHONE HCL 1 MG/ML IJ SOLN
0.2500 mg | INTRAMUSCULAR | Status: DC | PRN
  Administered 2015-05-10: 0.5 mg via INTRAVENOUS
  Administered 2015-05-10 (×2): 0.25 mg via INTRAVENOUS
  Administered 2015-05-10: 0.5 mg via INTRAVENOUS

## 2015-05-10 MED ORDER — GLYCOPYRROLATE 0.2 MG/ML IJ SOLN
INTRAMUSCULAR | Status: AC
  Filled 2015-05-10: qty 3

## 2015-05-10 MED ORDER — OXYCODONE HCL 5 MG PO TABS
5.0000 mg | ORAL_TABLET | ORAL | Status: DC | PRN
  Administered 2015-05-10 – 2015-05-12 (×10): 10 mg via ORAL
  Filled 2015-05-10 (×10): qty 2

## 2015-05-10 MED ORDER — ONDANSETRON HCL 4 MG/2ML IJ SOLN: 4.0000 mg | Freq: Four times a day (QID) | INTRAMUSCULAR | Status: DC | PRN

## 2015-05-10 MED ORDER — CHOLECALCIFEROL 10 MCG (400 UNIT) PO TABS
400.0000 [IU] | ORAL_TABLET | Freq: Two times a day (BID) | ORAL | Status: DC
  Administered 2015-05-10 – 2015-05-11 (×3): 400 [IU] via ORAL
  Filled 2015-05-10 (×5): qty 1

## 2015-05-10 MED ORDER — ALUM & MAG HYDROXIDE-SIMETH 200-200-20 MG/5ML PO SUSP: 30.0000 mL | ORAL | Status: DC | PRN

## 2015-05-10 MED ORDER — ENOXAPARIN SODIUM 30 MG/0.3ML ~~LOC~~ SOLN
30.0000 mg | Freq: Two times a day (BID) | SUBCUTANEOUS | Status: DC
  Administered 2015-05-11 – 2015-05-12 (×3): 30 mg via SUBCUTANEOUS
  Filled 2015-05-10 (×5): qty 0.3

## 2015-05-10 MED ORDER — VITAMIN D3 25 MCG (1000 UNIT) PO TABS
1000.0000 [IU] | ORAL_TABLET | ORAL | Status: DC
  Administered 2015-05-11 – 2015-05-12 (×3): 1000 [IU] via ORAL
  Filled 2015-05-10 (×4): qty 1

## 2015-05-10 MED ORDER — METHOCARBAMOL 500 MG PO TABS: 500.0000 mg | ORAL_TABLET | Freq: Four times a day (QID) | ORAL | Status: DC

## 2015-05-10 MED ORDER — SUCCINYLCHOLINE CHLORIDE 20 MG/ML IJ SOLN
INTRAMUSCULAR | Status: DC | PRN
  Administered 2015-05-10: 100 mg via INTRAVENOUS

## 2015-05-10 MED ORDER — LACTATED RINGERS IV SOLN
INTRAVENOUS | Status: DC
  Administered 2015-05-10 (×2): via INTRAVENOUS

## 2015-05-10 MED ORDER — OMEGA-3-ACID ETHYL ESTERS 1 G PO CAPS
1.0000 g | ORAL_CAPSULE | Freq: Two times a day (BID) | ORAL | Status: DC
  Administered 2015-05-10 – 2015-05-11 (×3): 1 g via ORAL
  Filled 2015-05-10 (×5): qty 1

## 2015-05-10 MED ORDER — PANTOPRAZOLE SODIUM 40 MG PO TBEC
40.0000 mg | DELAYED_RELEASE_TABLET | Freq: Every day | ORAL | Status: DC
  Administered 2015-05-11: 40 mg via ORAL
  Filled 2015-05-10 (×2): qty 1

## 2015-05-10 MED ORDER — DIPHENHYDRAMINE HCL 12.5 MG/5ML PO ELIX
12.5000 mg | ORAL_SOLUTION | ORAL | Status: DC | PRN
Start: 2015-05-10 — End: 2015-05-12

## 2015-05-10 MED ORDER — METOPROLOL SUCCINATE ER 25 MG PO TB24
25.0000 mg | ORAL_TABLET | Freq: Every day | ORAL | Status: DC
  Administered 2015-05-11: 25 mg via ORAL
  Filled 2015-05-10 (×2): qty 1

## 2015-05-10 MED ORDER — NEOSTIGMINE METHYLSULFATE 10 MG/10ML IV SOLN
INTRAVENOUS | Status: DC | PRN
  Administered 2015-05-10: 4 mg via INTRAVENOUS

## 2015-05-10 MED ORDER — KETOROLAC TROMETHAMINE 30 MG/ML IJ SOLN
INTRAMUSCULAR | Status: DC | PRN
  Administered 2015-05-10: 30 mg

## 2015-05-10 MED ORDER — ONDANSETRON HCL 4 MG/2ML IJ SOLN
INTRAMUSCULAR | Status: DC | PRN
  Administered 2015-05-10: 4 mg via INTRAVENOUS

## 2015-05-10 MED ORDER — BUPIVACAINE-EPINEPHRINE 0.25% -1:200000 IJ SOLN
INTRAMUSCULAR | Status: DC | PRN
  Administered 2015-05-10: 30 mg

## 2015-05-10 MED ORDER — FLEET ENEMA 7-19 GM/118ML RE ENEM: 1.0000 | ENEMA | Freq: Once | RECTAL | Status: AC | PRN

## 2015-05-10 MED ORDER — ASPIRIN EC 325 MG PO TBEC: 325.0000 mg | DELAYED_RELEASE_TABLET | Freq: Two times a day (BID) | ORAL | Status: DC

## 2015-05-10 MED ORDER — VITAMIN C 500 MG PO TABS
500.0000 mg | ORAL_TABLET | Freq: Every day | ORAL | Status: DC
  Administered 2015-05-10 – 2015-05-11 (×2): 500 mg via ORAL
  Filled 2015-05-10 (×3): qty 1

## 2015-05-10 MED ORDER — ONDANSETRON HCL 4 MG PO TABS
4.0000 mg | ORAL_TABLET | Freq: Four times a day (QID) | ORAL | Status: DC | PRN
  Administered 2015-05-12: 4 mg via ORAL
  Filled 2015-05-10: qty 1

## 2015-05-10 MED ORDER — DEXTROSE 5 % IV SOLN
500.0000 mg | Freq: Four times a day (QID) | INTRAVENOUS | Status: DC | PRN
  Administered 2015-05-10: 500 mg via INTRAVENOUS
  Filled 2015-05-10 (×2): qty 5

## 2015-05-10 SURGICAL SUPPLY — 69 items
BAG DECANTER FOR FLEXI CONT (MISCELLANEOUS) IMPLANT
BAG SPEC THK2 15X12 ZIP CLS (MISCELLANEOUS) ×1
BAG ZIPLOCK 12X15 (MISCELLANEOUS) ×4 IMPLANT
BANDAGE ELASTIC 4 VELCRO ST LF (GAUZE/BANDAGES/DRESSINGS) ×3 IMPLANT
BANDAGE ELASTIC 6 VELCRO ST LF (GAUZE/BANDAGES/DRESSINGS) ×3 IMPLANT
BANDAGE ESMARK 6X9 LF (GAUZE/BANDAGES/DRESSINGS) ×1 IMPLANT
BLADE SAG 18X100X1.27 (BLADE) ×3 IMPLANT
BLADE SAW SGTL 13.0X1.19X90.0M (BLADE) ×3 IMPLANT
BNDG CMPR 9X6 STRL LF SNTH (GAUZE/BANDAGES/DRESSINGS) ×1
BNDG ESMARK 6X9 LF (GAUZE/BANDAGES/DRESSINGS) ×3
CAP KNEE TOTAL 3 SIGMA ×2 IMPLANT
CEMENT HV SMART SET (Cement) ×4 IMPLANT
CUFF TOURN SGL QUICK 34 (TOURNIQUET CUFF) ×3
CUFF TRNQT CYL 34X4X40X1 (TOURNIQUET CUFF) ×1 IMPLANT
DECANTER SPIKE VIAL GLASS SM (MISCELLANEOUS) ×3 IMPLANT
DRAPE EXTREMITY T 121X128X90 (DRAPE) ×3 IMPLANT
DRAPE POUCH INSTRU U-SHP 10X18 (DRAPES) ×3 IMPLANT
DRAPE SHEET LG 3/4 BI-LAMINATE (DRAPES) ×3 IMPLANT
DRAPE U-SHAPE 47X51 STRL (DRAPES) ×3 IMPLANT
DRSG AQUACEL AG ADV 3.5X10 (GAUZE/BANDAGES/DRESSINGS) ×3 IMPLANT
DRSG TEGADERM 4X4.75 (GAUZE/BANDAGES/DRESSINGS) ×3 IMPLANT
DURAPREP 26ML APPLICATOR (WOUND CARE) ×3 IMPLANT
ELECT REM PT RETURN 9FT ADLT (ELECTROSURGICAL) ×3
ELECTRODE REM PT RTRN 9FT ADLT (ELECTROSURGICAL) ×1 IMPLANT
EVACUATOR 1/8 PVC DRAIN (DRAIN) ×3 IMPLANT
FACESHIELD WRAPAROUND (MASK) ×15 IMPLANT
FACESHIELD WRAPAROUND OR TEAM (MASK) ×5 IMPLANT
GAUZE SPONGE 2X2 8PLY STRL LF (GAUZE/BANDAGES/DRESSINGS) ×1 IMPLANT
GLOVE BIOGEL PI IND STRL 8 (GLOVE) ×2 IMPLANT
GLOVE BIOGEL PI INDICATOR 8 (GLOVE) ×4
GLOVE SURG ORTHO 8.0 STRL STRW (GLOVE) ×3 IMPLANT
GLOVE SURG ORTHO 9.0 STRL STRW (GLOVE) ×3 IMPLANT
GLOVE SURG SS PI 7.5 STRL IVOR (GLOVE) ×3 IMPLANT
GOWN STRL REUS W/TWL XL LVL3 (GOWN DISPOSABLE) ×6 IMPLANT
HANDPIECE INTERPULSE COAX TIP (DISPOSABLE) ×3
IMMOBILIZER KNEE 20 (SOFTGOODS) ×3
IMMOBILIZER KNEE 20 THIGH 36 (SOFTGOODS) ×1 IMPLANT
KIT BASIN OR (CUSTOM PROCEDURE TRAY) ×3 IMPLANT
LIQUID BAND (GAUZE/BANDAGES/DRESSINGS) ×3 IMPLANT
NDL SAFETY ECLIPSE 18X1.5 (NEEDLE) ×1 IMPLANT
NEEDLE HYPO 18GX1.5 SHARP (NEEDLE) ×3
NS IRRIG 1000ML POUR BTL (IV SOLUTION) ×3 IMPLANT
PACK TOTAL JOINT (CUSTOM PROCEDURE TRAY) ×3 IMPLANT
PEN SKIN MARKING BROAD (MISCELLANEOUS) ×3 IMPLANT
POSITIONER SURGICAL ARM (MISCELLANEOUS) ×3 IMPLANT
SET HNDPC FAN SPRY TIP SCT (DISPOSABLE) ×1 IMPLANT
SET PAD KNEE POSITIONER (MISCELLANEOUS) ×3 IMPLANT
SPONGE GAUZE 2X2 STER 10/PKG (GAUZE/BANDAGES/DRESSINGS) ×4
SPONGE LAP 18X18 X RAY DECT (DISPOSABLE) IMPLANT
SPONGE SURGIFOAM ABS GEL 100 (HEMOSTASIS) ×3 IMPLANT
STOCKINETTE 6  STRL (DRAPES) ×2
STOCKINETTE 6 STRL (DRAPES) ×1 IMPLANT
SUCTION FRAZIER 12FR DISP (SUCTIONS) ×3 IMPLANT
SUT BONE WAX W31G (SUTURE) ×2 IMPLANT
SUT MNCRL AB 3-0 PS2 18 (SUTURE) ×3 IMPLANT
SUT VIC AB 1 CT1 27 (SUTURE) ×12
SUT VIC AB 1 CT1 27XBRD ANTBC (SUTURE) ×4 IMPLANT
SUT VIC AB 2-0 CT1 27 (SUTURE) ×9
SUT VIC AB 2-0 CT1 TAPERPNT 27 (SUTURE) ×2 IMPLANT
SUT VLOC 180 0 24IN GS25 (SUTURE) ×3 IMPLANT
SYR 50ML LL SCALE MARK (SYRINGE) ×3 IMPLANT
TAPE STRIPS DRAPE STRL (GAUZE/BANDAGES/DRESSINGS) ×3 IMPLANT
TOWEL OR 17X26 10 PK STRL BLUE (TOWEL DISPOSABLE) ×3 IMPLANT
TOWEL OR NON WOVEN STRL DISP B (DISPOSABLE) ×2 IMPLANT
TOWER CARTRIDGE SMART MIX (DISPOSABLE) ×3 IMPLANT
TRAY FOLEY W/METER SILVER 14FR (SET/KITS/TRAYS/PACK) ×3 IMPLANT
WATER STERILE IRR 1500ML POUR (IV SOLUTION) ×6 IMPLANT
WRAP KNEE MAXI GEL POST OP (GAUZE/BANDAGES/DRESSINGS) ×3 IMPLANT
YANKAUER SUCT BULB TIP NO VENT (SUCTIONS) ×3 IMPLANT

## 2015-05-10 NOTE — Interval H&P Note (Signed)
History and Physical Interval Note:  05/10/2015 1:29 PM  Brittany Robertson  has presented today for surgery, with the diagnosis of RIGHT KNEE OA   The various methods of treatment have been discussed with the patient and family. After consideration of risks, benefits and other options for treatment, the patient has consented to  Procedure(s): TOTAL RIGHT KNEE ARTHROPLASTY (Right) as a surgical intervention .  The patient's history has been reviewed, patient examined, no change in status, stable for surgery.  I have reviewed the patient's chart and labs.  Questions were answered to the patient's satisfaction.     Noreen Mackintosh ANDREW

## 2015-05-10 NOTE — Progress Notes (Signed)
Unable to start IV w 20 gu. Call to holding.Tomie China  RN came in to start IV

## 2015-05-10 NOTE — Op Note (Signed)
DATE OF SURGERY:  05/10/2015  TIME: 3:30 PM  PATIENT NAME:  Brittany Robertson    AGE: 74 y.o.   PRE-OPERATIVE DIAGNOSIS:  RIGHT KNEE OA   POST-OPERATIVE DIAGNOSIS:  right knee osteoarthritis  PROCEDURE:  Procedure(s): TOTAL RIGHT KNEE ARTHROPLASTY  SURGEON:  Stephonie Wilcoxen ANDREW  ASSISTANT:  Bryson Stilwell, PA-C, present and scrubbed throughout the case, critical for assistance with exposure, retraction, instrumentation, and closure.  OPERATIVE IMPLANTS: Depuy PFC Sigma Rotating Platform.  Femur size 4, Tibia size 4, Patella size 35 3-peg oval button, with a 15 mm polyethylene insert.   PREOPERATIVE INDICATIONS:   Brittany Robertson is a 74 y.o. year old female with end stage bone on bone arthritis of the knee who failed conservative treatment and elected for Total Knee Arthroplasty.   The risks, benefits, and alternatives were discussed at length including but not limited to the risks of infection, bleeding, nerve injury, stiffness, blood clots, the need for revision surgery, cardiopulmonary complications, among others, and they were willing to proceed.  OPERATIVE DESCRIPTION:  The patient was brought to the operative room and placed in a supine position.  Spinal anesthesia was administered.  IV antibiotics were given.  The lower extremity was prepped and draped in the usual sterile fashion.  Time out was performed.  The leg was elevated and exsanguinated and the tourniquet was inflated.  Anterior quadriceps tendon splitting approach was performed.  The patella was retracted and osteophytes were removed.  The anterior horn of the medial and lateral meniscus was removed and cruciate ligaments resected.   The distal femur was opened with the drill and the intramedullary distal femoral cutting jig was utilized, set at 5 degrees resecting 10 mm off the distal femur.  Care was taken to protect the collateral ligaments.  The distal femoral sizing jig was applied, taking care to avoid  notching.  Then the 4-in-1 cutting jig was applied and the anterior and posterior femur was cut, along with the chamfer cuts.    Then the extramedullary tibial cutting jig was utilized making the appropriate cut using the anterior tibial crest as a reference building in appropriate posterior slope.  Care was taken during the cut to protect the medial and collateral ligaments.  The proximal tibia was removed along with the posterior horns of the menisci.   The posterior medial femoral osteophytes and posterior lateral femoral osteophytes were removed.    The flexion gap was then measured and was symmetric with the extension gap, measured at 12.  I completed the distal femoral preparation using the appropriate jig to prepare the box.  The patella was then measured, and cut with the saw.    The proximal tibia sized and prepared accordingly with the reamer and the punch, and then all components were trialed with the trial insert.  The knee was found to have excellent balance and full motion.    The above named components were then cemented into place and all excess cement was removed.  The trial polyethylene component was in place during cementation, and then was exchanged for the real polyethylene component.    The knee was easily taken through a range of motion and the patella tracked well and the knee irrigated copiously and the parapatellar and subcutaneous tissue closed with vicryl, and monocryl with steri strips for the skin.  The arthrotomy was closed at 90 of flexion. The wounds were dressed with sterile gauze and the tourniquet released and the patient was awakened and returned to the  PACU in stable and satisfactory condition.  There were no complications.  Total tourniquet time was 90 minutes.Vloc closureof periosteal.

## 2015-05-10 NOTE — Transfer of Care (Signed)
Immediate Anesthesia Transfer of Care Note  Patient: Brittany Robertson  Procedure(s) Performed: Procedure(s): TOTAL RIGHT KNEE ARTHROPLASTY (Right)  Patient Location: PACU  Anesthesia Type:General  Level of Consciousness: awake, alert , oriented and patient cooperative  Airway & Oxygen Therapy: Patient Spontanous Breathing and Patient connected to face mask oxygen  Post-op Assessment: Report given to RN and Post -op Vital signs reviewed and stable  Post vital signs: Reviewed and stable  Last Vitals:  Filed Vitals:   05/10/15 1031  BP: 147/79  Pulse: 58  Temp: 36.5 C  Resp: 16    Complications: No apparent anesthesia complications

## 2015-05-10 NOTE — Anesthesia Procedure Notes (Signed)
Procedure Name: Intubation Date/Time: 05/10/2015 1:42 PM Performed by: Dione Booze Pre-anesthesia Checklist: Emergency Drugs available, Suction available, Patient being monitored and Patient identified Patient Re-evaluated:Patient Re-evaluated prior to inductionOxygen Delivery Method: Circle system utilized Preoxygenation: Pre-oxygenation with 100% oxygen Intubation Type: IV induction Laryngoscope Size: Mac and 4 Grade View: Grade I Tube type: Oral Tube size: 7.5 mm Number of attempts: 1 Airway Equipment and Method: Stylet Secured at: 21 cm Tube secured with: Tape Dental Injury: Teeth and Oropharynx as per pre-operative assessment

## 2015-05-10 NOTE — Interval H&P Note (Signed)
History and Physical Interval Note:  05/10/2015 1:28 PM  Brittany Robertson  has presented today for surgery, with the diagnosis of RIGHT KNEE OA   The various methods of treatment have been discussed with the patient and family. After consideration of risks, benefits and other options for treatment, the patient has consented to  Procedure(s): TOTAL RIGHT KNEE ARTHROPLASTY (Right) as a surgical intervention .  The patient's history has been reviewed, patient examined, no change in status, stable for surgery.  I have reviewed the patient's chart and labs.  Questions were answered to the patient's satisfaction.     [name] ANDREW

## 2015-05-10 NOTE — Progress Notes (Signed)
PHARMACIST - PHYSICIAN ORDER COMMUNICATION  CONCERNING: P&T Medication Policy on Herbal Medications  DESCRIPTION:  This patient's order for:  Co-enzyme Q10 and Flex-a-min  has been noted.  This product(s) is classified as an "herbal" or natural product. Due to a lack of definitive safety studies or FDA approval, nonstandard manufacturing practices, plus the potential risk of unknown drug-drug interactions while on inpatient medications, the Pharmacy and Therapeutics Committee does not permit the use of "herbal" or natural products of this type within [facility].   ACTION TAKEN: The pharmacy department is unable to verify this order at this time and your patient has been informed of this safety policy. Please reevaluate patient's clinical condition at discharge and address if the herbal or natural product(s) should be resumed at that time.   Doreene Eland, PharmD, BCPS.   Pager: [phone] 05/10/2015. Now

## 2015-05-10 NOTE — Anesthesia Postprocedure Evaluation (Signed)
  Anesthesia Post-op Note  Patient: Brittany Robertson  Procedure(s) Performed: Procedure(s) (LRB): TOTAL RIGHT KNEE ARTHROPLASTY (Right)  Patient Location: PACU  Anesthesia Type: General  Level of Consciousness: awake and alert   Airway and Oxygen Therapy: Patient Spontanous Breathing  Post-op Pain: mild  Post-op Assessment: Post-op Vital signs reviewed, Patient's Cardiovascular Status Stable, Respiratory Function Stable, Patent Airway and No signs of Nausea or vomiting  Last Vitals:  Filed Vitals:   05/10/15 1635  BP:   Pulse: 71  Temp:   Resp: 11    Post-op Vital Signs: stable   Complications: No apparent anesthesia complications

## 2015-05-11 LAB — CBC
HCT: 30.1 % — ABNORMAL LOW (ref 36.0–46.0)
Hemoglobin: 9.9 g/dL — ABNORMAL LOW (ref 12.0–15.0)
MCH: 31.3 pg (ref 26.0–34.0)
MCHC: 32.9 g/dL (ref 30.0–36.0)
MCV: 95.3 fL (ref 78.0–100.0)
Platelets: 165 10*3/uL (ref 150–400)
RBC: 3.16 MIL/uL — ABNORMAL LOW (ref 3.87–5.11)
RDW: 13.9 % (ref 11.5–15.5)
WBC: 12.4 10*3/uL — ABNORMAL HIGH (ref 4.0–10.5)

## 2015-05-11 LAB — BASIC METABOLIC PANEL
Anion gap: 3 — ABNORMAL LOW (ref 5–15)
BUN: 19 mg/dL (ref 6–20)
CALCIUM: 7.6 mg/dL — AB (ref 8.9–10.3)
CHLORIDE: 105 mmol/L (ref 101–111)
CO2: 25 mmol/L (ref 22–32)
CREATININE: 0.7 mg/dL (ref 0.44–1.00)
GFR calc Af Amer: >60 mL/min (ref >60)
GFR calc non Af Amer: >60 mL/min (ref >60)
GLUCOSE: 156 mg/dL — AB (ref 65–99)
Potassium: 4.7 mmol/L (ref 3.5–5.1)
Sodium: 133 mmol/L — ABNORMAL LOW (ref 135–145)

## 2015-05-11 NOTE — Progress Notes (Signed)
Physical Therapy Treatment Patient Details Name: Brittany Robertson MRN: [id number] DOB: October 29, 1940 Today's Date: 05/16/2015    History of Present Illness R TKR; s/p L TKR 3/16    PT Comments    Improvement with mobility but pt continues ltd by c/o dizziness with ambulation - BP 117/63  Follow Up Recommendations  Home health PT     Equipment Recommendations  None recommended by PT    Recommendations for Other Services OT consult     Precautions / Restrictions Precautions Precautions: Fall;Knee Restrictions Weight Bearing Restrictions: No Other Position/Activity Restrictions: WBAT    Mobility  Bed Mobility Overal bed mobility: Needs Assistance Bed Mobility: Supine to Sit;Sit to Supine     Supine to sit: Min guard Sit to supine: Min assist   General bed mobility comments: cues for sequence and use of L LE to self assist  Transfers Overall transfer level: Needs assistance Equipment used: Rolling walker (2 wheeled) Transfers: Sit to/from Stand Sit to Stand: Min assist         General transfer comment: cues for LE management and use of UEs to self assist  Ambulation/Gait Ambulation/Gait assistance: Min assist Ambulation Distance (Feet): 31 Feet (twice) Assistive device: Rolling walker (2 wheeled) Gait Pattern/deviations: Step-to pattern;Decreased step length - right;Decreased step length - left;Shuffle;Trunk flexed Gait velocity: decr   General Gait Details: cues for posture, sequence and position from [employer] Mobility    Modified Rankin (Stroke Patients Only)       Balance                                    Cognition Arousal/Alertness: Awake/alert Behavior During Therapy: WFL for tasks assessed/performed Overall Cognitive Status: Within Functional Limits for tasks assessed                      Exercises      General Comments        Pertinent Vitals/Pain Pain Assessment: 0-10 Pain  Score: 3  Pain Location: R knee Pain Descriptors / Indicators: Aching;Sore Pain Intervention(s): Limited activity within patient's tolerance;Monitored during session;Premedicated before session;Ice applied    Home Living                      Prior Function            PT Goals (current goals can now be found in the care plan section) Acute Rehab PT Goals Patient Stated Goal: Resume previous lifestyle with decreased pain PT Goal Formulation: With patient Time For Goal Achievement: 05/18/15 Potential to Achieve Goals: Good Progress towards PT goals: Progressing toward goals    Frequency  7X/week    PT Plan Current plan remains appropriate    Co-evaluation             End of Session Equipment Utilized During Treatment: Gait belt Activity Tolerance: Patient tolerated treatment well Patient left: in bed;with call bell/phone within reach;with family/visitor present     Time: 1316-1350 PT Time Calculation (min) (ACUTE ONLY): 34 min  Charges:  $Gait Training: 23-37 mins                    G Codes:      Brittany Robertson 05/16/2015, 2:04 PM

## 2015-05-11 NOTE — Progress Notes (Signed)
OT Cancellation Note  Patient Details Name: Brittany Robertson MRN: [id number] DOB: 1941-02-17   Cancelled Treatment:    Reason Eval/Treat Not Completed: PT screened, no needs identified, will sign off  Ilean Spradlin 05/11/2015, 9:17 AM  Lesle Chris, OTR/L [phone] 05/11/2015

## 2015-05-11 NOTE — Progress Notes (Signed)
Subjective: 1 Day Post-Op Procedure(s) (LRB): TOTAL RIGHT KNEE ARTHROPLASTY (Right) Patient reports pain as 2 on 0-10 scale.Hemovac DCd and no major issues today.    Objective: Vital signs in last 24 hours: Temp:  [97.4 F (36.3 C)-98.6 F (37 C)] 98.2 F (36.8 C) (07/23 0600) Pulse Rate:  [58-74] 63 (07/23 0600) Resp:  [11-16] 16 (07/23 0600) BP: (86-175)/(44-79) 86/44 mmHg (07/23 0600) SpO2:  [96 %-100 %] 100 % (07/23 0600) Weight:  [98 kg (216 lb 0.8 oz)-98.884 kg (218 lb)] 98 kg (216 lb 0.8 oz) (07/22 1725)  Intake/Output from previous day: 07/22 0701 - 07/23 0700 In: 3210 [P.O.:960; I.V.:2000; IV Piggyback:250] Out: 1655 [Urine:1025; Drains:630] Intake/Output this shift:     Recent Labs  05/11/15 0434  HGB 9.9*    Recent Labs  05/11/15 0434  WBC 12.4*  RBC 3.16*  HCT 30.1*  PLT 165    Recent Labs  05/11/15 0434  NA 133*  K 4.7  CL 105  CO2 25  BUN 19  CREATININE 0.70  GLUCOSE 156*  CALCIUM 7.6*   No results for input(s): LABPT, INR in the last 72 hours.  Neurologically intact No cellulitis present Compartment soft  Assessment/Plan: 1 Day Post-Op Procedure(s) (LRB): TOTAL RIGHT KNEE ARTHROPLASTY (Right) Up with therapy discontinued for tomorrow.  Rettie Laird A 05/11/2015, 9:40 AM

## 2015-05-11 NOTE — Clinical Social Work Note (Signed)
CSW received consult for SNF  CSW reviewed pt chart that reflected PT evaluation recommending "home health"  Should additional CSW needs arise please re consult  CSW signing off  .Dede Query, LCSW [employer] Clinical Social Worker - Weekend Coverage cell #: [phone]

## 2015-05-11 NOTE — Evaluation (Signed)
Physical Therapy Evaluation Patient Details Name: Brittany Robertson MRN: [id number] DOB: 1941/05/11 Today's Date: 05/11/2015   History of Present Illness  R TKR; s/p L TKR 3/16  Clinical Impression  Pt s/p R TKR presents with decreased R LE strength/ROM and post op pain limiting functional mobility.  Pt should progress well to dc home with family assist and HHPT follow up.    Follow Up Recommendations Home health PT    Equipment Recommendations  None recommended by PT    Recommendations for Other Services OT consult     Precautions / Restrictions Precautions Precautions: Fall;Knee Required Braces or Orthoses: Knee Immobilizer - Right Knee Immobilizer - Right: Discontinue once straight leg raise with < 10 degree lag (Pt performed IND SLR this am) Restrictions Weight Bearing Restrictions: No Other Position/Activity Restrictions: WBAT      Mobility  Bed Mobility Overal bed mobility: Needs Assistance Bed Mobility: Supine to Sit     Supine to sit: Min assist     General bed mobility comments: cues for sequence and use of L LE to self assist  Transfers Overall transfer level: Needs assistance Equipment used: Rolling walker (2 wheeled) Transfers: Sit to/from Stand Sit to Stand: Min assist         General transfer comment: cues for LE management and use of UEs to self assist  Ambulation/Gait Ambulation/Gait assistance: Min assist Ambulation Distance (Feet): 14 Feet Assistive device: Rolling walker (2 wheeled) Gait Pattern/deviations: Step-to pattern;Decreased step length - right;Decreased step length - left;Shuffle;Trunk flexed Gait velocity: decr   General Gait Details: cues for posture, sequence and position from [employer] Mobility    Modified Rankin (Stroke Patients Only)       Balance                                             Pertinent Vitals/Pain Pain Assessment: 0-10 Pain Score: 3  Pain Location:  R knee Pain Descriptors / Indicators: Aching;Sore Pain Intervention(s): Limited activity within patient's tolerance;Monitored during session;Premedicated before session;Ice applied    Home Living Family/patient expects to be discharged to:: Private residence Living Arrangements: Spouse/significant other Available Help at Discharge: Family Type of Home: House Home Access: Stairs to enter Entrance Stairs-Rails: None Entrance Stairs-Number of Steps: 1+2 Home Layout: One level Home Equipment: Environmental consultant - 2 wheels;Cane - single point;Bedside commode      Prior Function Level of Independence: Independent;Independent with assistive device(s)               Hand Dominance   Dominant Hand: Right    Extremity/Trunk Assessment   Upper Extremity Assessment: Overall WFL for tasks assessed           Lower Extremity Assessment: RLE deficits/detail RLE Deficits / Details: 3/5 quads with AAROM at knee -10 - 75    Cervical / Trunk Assessment: Normal  Communication   Communication: No difficulties  Cognition Arousal/Alertness: Awake/alert Behavior During Therapy: WFL for tasks assessed/performed Overall Cognitive Status: Within Functional Limits for tasks assessed                      General Comments      Exercises Total Joint Exercises Ankle Circles/Pumps: AROM;Both;15 reps;Supine Quad Sets: AROM;Both;Supine;10 reps Heel Slides: Right;10 reps;Supine;AAROM Straight Leg Raises: AAROM;AROM;Right;10 reps;Supine  Assessment/Plan    PT Assessment Patient needs continued PT services  PT Diagnosis Difficulty walking   PT Problem List Decreased strength;Decreased range of motion;Decreased activity tolerance;Decreased mobility;Decreased knowledge of use of DME;Obesity;Pain;Decreased knowledge of precautions  PT Treatment Interventions DME instruction;Gait training;Stair training;Functional mobility training;Therapeutic activities;Therapeutic exercise;Patient/family  education   PT Goals (Current goals can be found in the Care Plan section) Acute Rehab PT Goals Patient Stated Goal: Resume previous lifestyle with decreased pain PT Goal Formulation: With patient Time For Goal Achievement: 05/18/15 Potential to Achieve Goals: Good    Frequency 7X/week   Barriers to discharge        Co-evaluation               End of Session Equipment Utilized During Treatment: Gait belt Activity Tolerance: Patient tolerated treatment well Patient left: in chair;with call bell/phone within reach;with family/visitor present Nurse Communication: Mobility status         Time: 1191-4782 PT Time Calculation (min) (ACUTE ONLY): 30 min   Charges:     PT Treatments $Therapeutic Exercise: 8-22 mins   PT G Codes:        Amariss Detamore 05-23-15, 12:41 PM

## 2015-05-12 LAB — CBC
HCT: 33.1 % — ABNORMAL LOW (ref 36.0–46.0)
HEMOGLOBIN: 10.7 g/dL — AB (ref 12.0–15.0)
MCH: 31.3 pg (ref 26.0–34.0)
MCHC: 32.3 g/dL (ref 30.0–36.0)
MCV: 96.8 fL (ref 78.0–100.0)
PLATELETS: 187 10*3/uL (ref 150–400)
RBC: 3.42 MIL/uL — AB (ref 3.87–5.11)
RDW: 14.1 % (ref 11.5–15.5)
WBC: 9.9 10*3/uL (ref 4.0–10.5)

## 2015-05-12 NOTE — Care Management Note (Signed)
Case Management Note  Patient Details  Name: Brittany Robertson MRN: [id number] Date of Birth: August 15, 1941  Subjective/Objective:       Right total knee arthroplasty             Action/Plan: Home Health  Expected Discharge Date:  05/12/2015              Expected Discharge Plan:  [place]  In-House Referral:     Discharge planning Services  CM Consult  Post Acute Care Choice:  Home Health Choice offered to:  Patient    HH Arranged:  PT HH Agency:  [facility]  Status of Service:  Completed, signed off  Medicare Important Message Given:    Date Medicare IM Given:    Medicare IM give by:    Date Additional Medicare IM Given:    Additional Medicare Important Message give by:     If discussed at [place] of Stay Meetings, dates discussed:    Additional Comments: Guys. NCM spoke to pt and offered choice for [facility]. Pt requesting [facility] for [facility]. Has RW and 3n1 at home. Husband at home to assist with care. [facility] aware of referral and scheduled dc home today. Erenest Rasher, RN 05/12/2015, 9:55 AM

## 2015-05-12 NOTE — Progress Notes (Signed)
Physical Therapy Treatment Patient Details Name: NAAVYA POSTMA MRN: [id number] DOB: [birth year] Today's Date: 05-20-15    History of Present Illness R TKR; s/p L TKR 3/16    PT Comments    Therex performed.  OOB deferred at pt request to allow rest break before tackling stairs.  Follow Up Recommendations  Home health PT     Equipment Recommendations  None recommended by PT    Recommendations for Other Services OT consult     Precautions / Restrictions Precautions Precautions: Fall;Knee Required Braces or Orthoses: Knee Immobilizer - Right Knee Immobilizer - Right: Discontinue once straight leg raise with < 10 degree lag Restrictions Weight Bearing Restrictions: No Other Position/Activity Restrictions: WBAT    Mobility  Bed Mobility                  Transfers                    Ambulation/Gait                 Stairs            Wheelchair Mobility    Modified Rankin (Stroke Patients Only)       Balance                                    Cognition Arousal/Alertness: Awake/alert Behavior During Therapy: WFL for tasks assessed/performed Overall Cognitive Status: Within Functional Limits for tasks assessed                      Exercises Total Joint Exercises Ankle Circles/Pumps: AROM;Both;15 reps;Supine Quad Sets: AROM;Both;Supine;20 reps Heel Slides: Right;Supine;AAROM;20 reps Straight Leg Raises: AAROM;AROM;Right;Supine;20 reps Goniometric ROM: AAROM R knee -10- 80    General Comments        Pertinent Vitals/Pain Pain Assessment: 0-10 Pain Score: 6  Pain Location: R knee Pain Descriptors / Indicators: Aching;Sore Pain Intervention(s): Limited activity within patient's tolerance;Monitored during session;Premedicated before session;Ice applied    Home Living                      Prior Function            PT Goals (current goals can now be found in the care plan section) Acute  Rehab PT Goals Patient Stated Goal: Resume previous lifestyle with decreased pain PT Goal Formulation: With patient Time For Goal Achievement: 05/18/15 Potential to Achieve Goals: Good Progress towards PT goals: Progressing toward goals    Frequency  7X/week    PT Plan Current plan remains appropriate    Co-evaluation             End of Session Equipment Utilized During Treatment: Gait belt Activity Tolerance: Patient tolerated treatment well Patient left: in bed;with call bell/phone within reach;with family/visitor present     Time: 1005-1025 PT Time Calculation (min) (ACUTE ONLY): 20 min  Charges:  $Therapeutic Exercise: 8-22 mins                    G Codes:      Ihor Meinzer May 20, 2015, 10:50 AM

## 2015-05-12 NOTE — Progress Notes (Signed)
     Subjective: 2 Days Post-Op Procedure(s) (LRB): TOTAL RIGHT KNEE ARTHROPLASTY (Right)   Patient reports pain as mild, pain controlled. No events throughout the night. Has been through this with the other knee not all that long ago. Ready to be discharged home if she does well with PT.  Objective:   VITALS:   Filed Vitals:   05/12/15 0635  BP: 125/55  Pulse: 74  Temp: 99.2 F (37.3 C)  Resp: 16    Dorsiflexion/Plantar flexion intact Incision: dressing C/D/I No cellulitis present Compartment soft  LABS  Recent Labs  05/11/15 0434 05/12/15 0530  HGB 9.9* 10.7*  HCT 30.1* 33.1*  WBC 12.4* 9.9  PLT 165 187     Recent Labs  05/11/15 0434  NA 133*  K 4.7  BUN 19  CREATININE 0.70  GLUCOSE 156*     Assessment/Plan: 2 Days Post-Op Procedure(s) (LRB): TOTAL RIGHT KNEE ARTHROPLASTY (Right) Up with therapy Discharge home with home health  Follow up in 2 weeks at [facility]. Follow up with Dr Theda Sers in 2 weeks.  Contact information:  [facility] [address] [place]   PAC  05/12/2015, 8:04 AM

## 2015-05-12 NOTE — Progress Notes (Signed)
Physical Therapy Treatment Patient Details Name: Brittany Robertson MRN: [id number] DOB: Feb 17, 1941 Today's Date: 05/12/2015    History of Present Illness R TKR; s/p L TKR 3/16    PT Comments    Steady progress with mobility but ltd by nausea on stairs.  Pt eager for dc home  Follow Up Recommendations  Home health PT     Equipment Recommendations  None recommended by PT    Recommendations for Other Services OT consult     Precautions / Restrictions Precautions Precautions: Fall;Knee Required Braces or Orthoses: Knee Immobilizer - Right Knee Immobilizer - Right: Discontinue once straight leg raise with < 10 degree lag Restrictions Weight Bearing Restrictions: No Other Position/Activity Restrictions: WBAT    Mobility  Bed Mobility Overal bed mobility: Needs Assistance Bed Mobility: Supine to Sit     Supine to sit: Supervision        Transfers Overall transfer level: Needs assistance Equipment used: Rolling walker (2 wheeled) Transfers: Sit to/from Stand Sit to Stand: Min guard Stand pivot transfers: Min guard       General transfer comment: cues for LE management and use of UEs to self assist; Stand/pvt bed to [facility]  Ambulation/Gait Ambulation/Gait assistance: Min guard;Supervision Ambulation Distance (Feet): 60 Feet Assistive device: Rolling walker (2 wheeled) Gait Pattern/deviations: Step-to pattern;Decreased step length - right;Decreased step length - left;Shuffle;Trunk flexed Gait velocity: decr   General Gait Details: min cues for posture, sequence and position from RW   Stairs Stairs: Yes Stairs assistance: Min assist Stair Management: No rails;Backwards;With walker;Step to pattern Number of Stairs: 3 General stair comments: single step and 2 stairs bkwd with RW and cues for sequence and foot/RW placement.  Spouse assisting with 2 stairs  Wheelchair Mobility    Modified Rankin (Stroke Patients Only)       Balance                                    Cognition Arousal/Alertness: Awake/alert Behavior During Therapy: WFL for tasks assessed/performed Overall Cognitive Status: Within Functional Limits for tasks assessed                      Exercises Total Joint Exercises Ankle Circles/Pumps: AROM;Both;15 reps;Supine Quad Sets: AROM;Both;Supine;20 reps Heel Slides: Right;Supine;AAROM;20 reps Straight Leg Raises: AAROM;AROM;Right;Supine;20 reps Goniometric ROM: AAROM R knee -10- 80    General Comments        Pertinent Vitals/Pain Pain Assessment: 0-10 Pain Score: 4  Pain Location: R knee Pain Descriptors / Indicators: Aching;Sore Pain Intervention(s): Limited activity within patient's tolerance;Monitored during session;Premedicated before session;Ice applied    Home Living                      Prior Function            PT Goals (current goals can now be found in the care plan section) Acute Rehab PT Goals Patient Stated Goal: Resume previous lifestyle with decreased pain PT Goal Formulation: With patient Time For Goal Achievement: 05/18/15 Potential to Achieve Goals: Good Progress towards PT goals: Progressing toward goals    Frequency  7X/week    PT Plan Current plan remains appropriate    Co-evaluation             End of Session Equipment Utilized During Treatment: Gait belt Activity Tolerance: Patient tolerated treatment well Patient left: in chair;with call bell/phone within reach;with family/visitor  present     Time: 2284-0698 PT Time Calculation (min) (ACUTE ONLY): 24 min  Charges:  $Gait Training: 8-22 mins $Therapeutic Exercise: 8-22 mins $Therapeutic Activity: 8-22 mins                    G Codes:      Pariss Hommes 2015/06/10, 11:51 AM

## 2015-05-13 ENCOUNTER — Encounter (HOSPITAL_COMMUNITY): Payer: Self-pay | Admitting: Specialist

## 2015-05-30 NOTE — Discharge Summary (Signed)
Physician Discharge Summary  Patient ID: Brittany Robertson MRN: [id number] DOB/AGE: 76-Aug-1942 76 y.o.  Admit date: 05/10/2015 Discharge date:05/12/2015  Admission Diagnoses: Righ tknee OA  Discharge Diagnoses:  Active Problems:   Osteoarthritis of right knee   S/P total knee replacement   S/P knee replacement   Discharged Condition: good  Hospital Course:  Brittany Robertson is a 74 y.o. who was admitted to [facility] They were brought to the operating room on 05/10/2015 and underwent Procedure(s): TOTAL RIGHT KNEE ARTHROPLASTY.  Patient tolerated the procedure well and was later transferred to the recovery room and then to the orthopaedic floor for postoperative care.  They were given PO and IV analgesics for pain control following their surgery.  They were given 24 hours of postoperative antibiotics of  Anti-infectives    Start     Dose/Rate Route Frequency Ordered Stop   05/11/15 0200  vancomycin (VANCOCIN) IVPB 1000 mg/200 mL premix     1,000 mg 200 mL/hr over 60 Minutes Intravenous Every 12 hours 05/10/15 1741 05/11/15 0202   05/10/15 0600  vancomycin (VANCOCIN) 1,500 mg in sodium chloride 0.9 % 500 mL IVPB     1,500 mg 250 mL/hr over 120 Minutes Intravenous On call to O.R. 05/09/15 1352 05/10/15 1401     and started on DVT prophylaxis in the form of lovenox.   PT and OT were ordered for total joint protocol.  Discharge planning consulted to help with postop disposition and equipment needs.  Patient had a good night on the evening of surgery and started to get up OOB with therapy on day one.  Hemovac drain was pulled without difficulty.  Continued to work with therapy into day two.  Dressing was with normal limits.  The patient had progressed with therapy and meeting their goals. Patient was seen in rounds and was ready to go home.  Consults: n/a  Significant Diagnostic Studies:routine  Treatments: rountine  Discharge Exam: Blood pressure 125/55, pulse 74, temperature  99.2 F (37.3 C), temperature source Oral, resp. rate 16, height 5\' 4"  (1.626 m), weight 98 kg (216 lb 0.8 oz), SpO2 95 %. Well nourished. Alert and oriented x3. RRR, Lungs clear, BS x4. Abdomen soft and non tender. Right Calf soft and non tender. Right knee dressing C/D/I. No DVT signs. Compartment soft. No signs of infection.  Right LE grossly neurovascular intact.  Disposition: 06-Home-Health Care Svc  Discharge Instructions    Call MD / Call 911    Complete by:  As directed   If you experience chest pain or shortness of breath, CALL 911 and be transported to the hospital emergency room.  If you develope a fever above 101 F, pus (white drainage) or increased drainage or redness at the wound, or calf pain, call your surgeon's office.     Constipation Prevention    Complete by:  As directed   Drink plenty of fluids.  Prune juice may be helpful.  You may use a stool softener, such as Colace (over the counter) 100 mg twice a day.  Use MiraLax (over the counter) for constipation as needed.     Diet - low sodium heart healthy    Complete by:  As directed      Discharge instructions    Complete by:  As directed   INSTRUCTIONS AFTER JOINT REPLACEMENT   Remove items at home which could result in a fall. This includes throw rugs or furniture in walking pathways ICE to the affected joint every  three hours while awake for 30 minutes at a time, for at least the first 3-5 days, and then as needed for pain and swelling.  Continue to use ice for pain and swelling. You may notice swelling that will progress down to the foot and ankle.  This is normal after surgery.  Elevate your leg when you are not up walking on it.   Continue to use the breathing machine you got in the hospital (incentive spirometer) which will help keep your temperature down.  It is common for your temperature to cycle up and down following surgery, especially at night when you are not up moving around and exerting yourself.  The breathing  machine keeps your lungs expanded and your temperature down.   DIET:  As you were doing prior to hospitalization, we recommend a well-balanced diet.  DRESSING / WOUND CARE / SHOWERING  Keep the surgical dressing until follow up.  The dressing is water proof, so you can shower without any extra covering.  IF THE DRESSING FALLS OFF or the wound gets wet inside, change the dressing with sterile gauze.  Please use good hand washing techniques before changing the dressing.  Do not use any lotions or creams on the incision until instructed by your surgeon.    ACTIVITY  Increase activity slowly as tolerated, but follow the weight bearing instructions below.   No driving for 6 weeks or until further direction given by your physician.  You cannot drive while taking narcotics.  No lifting or carrying greater than 10 lbs. until further directed by your surgeon. Avoid periods of inactivity such as sitting longer than an hour when not asleep. This helps prevent blood clots.  You may return to work once you are authorized by your doctor.     WEIGHT BEARING   Weight bearing as tolerated with assist device (walker, cane, etc) as directed, use it as long as suggested by your surgeon or therapist, typically at least 4-6 weeks.   EXERCISES  Results after joint replacement surgery are often greatly improved when you follow the exercise, range of motion and muscle strengthening exercises prescribed by your doctor. Safety measures are also important to protect the joint from further injury. Any time any of these exercises cause you to have increased pain or swelling, decrease what you are doing until you are comfortable again and then slowly increase them. If you have problems or questions, call your caregiver or physical therapist for advice.   Rehabilitation is important following a joint replacement. After just a few days of immobilization, the muscles of the leg can become weakened and shrink (atrophy).   These exercises are designed to build up the tone and strength of the thigh and leg muscles and to improve motion. Often times heat used for twenty to thirty minutes before working out will loosen up your tissues and help with improving the range of motion but do not use heat for the first two weeks following surgery (sometimes heat can increase post-operative swelling).   These exercises can be done on a training (exercise) mat, on the floor, on a table or on a bed. Use whatever works the best and is most comfortable for you.    Use music or television while you are exercising so that the exercises are a pleasant break in your day. This will make your life better with the exercises acting as a break in your routine that you can look forward to.   Perform all exercises about fifteen  times, three times per day or as directed.  You should exercise both the operative leg and the other leg as well.   Exercises include:   Quad Sets - Tighten up the muscle on the front of the thigh (Quad) and hold for 5-10 seconds.   Straight Leg Raises - With your knee straight (if you were given a brace, keep it on), lift the leg to 60 degrees, hold for 3 seconds, and slowly lower the leg.  Perform this exercise against resistance later as your leg gets stronger.  Leg Slides: Lying on your back, slowly slide your foot toward your buttocks, bending your knee up off the floor (only go as far as is comfortable). Then slowly slide your foot back down until your leg is flat on the floor again.  Angel Wings: Lying on your back spread your legs to the side as far apart as you can without causing discomfort.  Hamstring Strength:  Lying on your back, push your heel against the floor with your leg straight by tightening up the muscles of your buttocks.  Repeat, but this time bend your knee to a comfortable angle, and push your heel against the floor.  You may put a pillow under the heel to make it more comfortable if necessary.   A  rehabilitation program following joint replacement surgery can speed recovery and prevent re-injury in the future due to weakened muscles. Contact your doctor or a physical therapist for more information on knee rehabilitation.    CONSTIPATION  Constipation is defined medically as fewer than three stools per week and severe constipation as less than one stool per week.  Even if you have a regular bowel pattern at home, your normal regimen is likely to be disrupted due to multiple reasons following surgery.  Combination of anesthesia, postoperative narcotics, change in appetite and fluid intake all can affect your bowels.   YOU MUST use at least one of the following options; they are listed in order of increasing strength to get the job done.  They are all available over the counter, and you may need to use some, POSSIBLY even all of these options:    Drink plenty of fluids (prune juice may be helpful) and high fiber foods Colace 100 mg by mouth twice a day  Senokot for constipation as directed and as needed Dulcolax (bisacodyl), take with full glass of water  Miralax (polyethylene glycol) once or twice a day as needed.  If you have tried all these things and are unable to have a bowel movement in the first 3-4 days after surgery call either your surgeon or your primary doctor.    If you experience loose stools or diarrhea, hold the medications until you stool forms back up.  If your symptoms do not get better within 1 week or if they get worse, check with your doctor.  If you experience "the worst abdominal pain ever" or develop nausea or vomiting, please contact the office immediately for further recommendations for treatment.   ITCHING:  If you experience itching with your medications, try taking only a single pain pill, or even half a pain pill at a time.  You can also use Benadryl over the counter for itching or also to help with sleep.   TED HOSE STOCKINGS:  Use stockings on both legs until  for at least 2 weeks or as directed by physician office. They may be removed at night for sleeping.  MEDICATIONS:  See your medication summary on  the "After Visit Summary" that nursing will review with you.  You may have some home medications which will be placed on hold until you complete the course of blood thinner medication.  It is important for you to complete the blood thinner medication as prescribed.  PRECAUTIONS:  If you experience chest pain or shortness of breath - call 911 immediately for transfer to the hospital emergency department.   If you develop a fever greater that 101 F, purulent drainage from wound, increased redness or drainage from wound, foul odor from the wound/dressing, or calf pain - CONTACT YOUR SURGEON.                                                   FOLLOW-UP APPOINTMENTS:  If you do not already have a post-op appointment, please call the office for an appointment to be seen by your surgeon.  Guidelines for how soon to be seen are listed in your "After Visit Summary", but are typically between 1-4 weeks after surgery.  OTHER INSTRUCTIONS:   Knee Replacement:  Do not place pillow under knee, focus on keeping the knee straight while resting. CPM instructions: 0-90 degrees, 2 hours in the morning, 2 hours in the afternoon, and 2 hours in the evening. Place foam block, curve side up under heel at all times except when in CPM or when walking.  DO NOT modify, tear, cut, or change the foam block in any way.  MAKE SURE YOU:  Understand these instructions.  Get help right away if you are not doing well or get worse.    Thank you for letting us be a part of your medical care team.  It is a privilege we respect greatly.  We hope these instructions will help you stay on track for a fast and full recovery!     Increase activity slowly as tolerated    Complete by:  As directed             Medication List    STOP taking these medications        baclofen 10 MG tablet   Commonly known as:  LIORESAL      TAKE these medications        aspirin EC 325 MG tablet  Take 1 tablet (325 mg total) by mouth 2 (two) times daily.     atorvastatin 20 MG tablet  Commonly known as:  LIPITOR  Take 1 tablet (20 mg total) by mouth daily. BRAND NAME ONLY due to ALLERGY     BREATHE RIGHT SMALL/MEDIUM Strp  1 each by Does not apply route at bedtime as needed (congestion).     CALCIUM + D PO  Take 1 tablet by mouth 2 (two) times daily. Sun, Tues, Thurs, Sat     cetirizine 10 MG tablet  Commonly known as:  ZYRTEC  Take 10 mg by mouth every morning.     cholecalciferol 400 UNITS Tabs tablet  Commonly known as:  VITAMIN D  Take 400 Units by mouth 2 (two) times daily.     Coenzyme Q10 Liposomal 100 MG/ML Liqd  Take 100 mg by mouth every evening.     denosumab 60 MG/ML Soln injection  Commonly known as:  PROLIA  Inject 60 mg into the skin every 6 (six) months. Administer in upper arm, thigh, or abdomen  docusate sodium 100 MG capsule  Commonly known as:  COLACE  Take 100 mg by mouth every evening.     esomeprazole 40 MG capsule  Commonly known as:  NEXIUM  Take 40 mg by mouth daily before breakfast.     ezetimibe 10 MG tablet  Commonly known as:  ZETIA  Take 1 tablet (10 mg total) by mouth every evening.     ferrous sulfate 325 (65 FE) MG tablet  Take 1 tablet (325 mg total) by mouth 3 (three) times daily after meals.     fish oil-omega-3 fatty acids 1000 MG capsule  Take 1 g by mouth 2 (two) times daily.     FLEX-A-MIN JOINT FLEX Tabs  Take 1 tablet by mouth 2 (two) times daily.     furosemide 20 MG tablet  Commonly known as:  LASIX  Take 20 mg by mouth as needed for fluid or edema.     meloxicam 15 MG tablet  Commonly known as:  MOBIC  Take 15 mg by mouth every evening.     methocarbamol 500 MG tablet  Commonly known as:  ROBAXIN  Take 1 tablet (500 mg total) by mouth 4 (four) times daily.     metoprolol succinate 25 MG 24 hr tablet   Commonly known as:  TOPROL-XL  Take 1 tablet (25 mg total) by mouth daily.     MYRBETRIQ 25 MG Tb24 tablet  Generic drug:  mirabegron ER  Take 25 mg by mouth every evening.     nitroGLYCERIN 0.4 MG SL tablet  Commonly known as:  NITROSTAT  Place 1 tablet (0.4 mg total) under the tongue every 5 (five) minutes as needed. As needed for chest pain x 3 doses     olmesartan 40 MG tablet  Commonly known as:  BENICAR  Take 1 tablet (40 mg total) by mouth daily.     oxyCODONE-acetaminophen 5-325 MG per tablet  Commonly known as:  ROXICET  Take 1-2 tablets by mouth every 4 (four) hours as needed for severe pain.     potassium chloride SA 20 MEQ tablet  Commonly known as:  K-DUR,KLOR-CON  Take 1 tablet (20 mEq total) by mouth every morning.     sodium chloride 0.65 % Soln nasal spray  Commonly known as:  OCEAN  Place 1 spray into both nostrils daily as needed for congestion.     SYSTANE ULTRA OP  Apply 1 drop to eye 2 (two) times daily as needed (dry eyes).     vitamin C 500 MG tablet  Commonly known as:  ASCORBIC ACID  Take 500 mg by mouth at bedtime.     vitamin E 400 UNIT capsule  Take 400 Units by mouth at bedtime.           Follow-up Information    Follow up with Cynda Familia, MD. Schedule an appointment as soon as possible for a visit in 2 weeks.   Specialty:  Orthopedic Surgery   Contact information:   [address] [place] [address] [phone]       Follow up with [facility].   Why:  Home Health Physical Therapy   Contact information:   [address] [place] [address] [phone]       Signed: Lajean Manes 05/30/2015, 12:42 PM

## 2015-07-01 ENCOUNTER — Other Ambulatory Visit: Payer: Self-pay | Admitting: Cardiology

## 2015-07-09 ENCOUNTER — Other Ambulatory Visit: Payer: Self-pay | Admitting: Cardiology

## 2015-07-22 DIAGNOSIS — H35371 Puckering of macula, right eye: Secondary | ICD-10-CM | POA: Insufficient documentation

## 2015-08-20 ENCOUNTER — Other Ambulatory Visit: Payer: Self-pay | Admitting: Cardiology

## 2015-10-22 ENCOUNTER — Other Ambulatory Visit: Payer: Self-pay | Admitting: Cardiology

## 2015-11-27 ENCOUNTER — Other Ambulatory Visit: Payer: Self-pay

## 2015-11-27 DIAGNOSIS — Z1231 Encounter for screening mammogram for malignant neoplasm of breast: Secondary | ICD-10-CM

## 2015-12-03 ENCOUNTER — Other Ambulatory Visit: Payer: Self-pay | Admitting: Cardiology

## 2016-01-03 ENCOUNTER — Ambulatory Visit
Admission: RE | Admit: 2016-01-03 | Discharge: 2016-01-03 | Disposition: A | Discharge status: Home / Self Care | Payer: Medicare Other | Source: Ambulatory Visit

## 2016-01-03 DIAGNOSIS — Z1231 Encounter for screening mammogram for malignant neoplasm of breast: Secondary | ICD-10-CM

## 2016-04-23 ENCOUNTER — Telehealth: Payer: Self-pay | Admitting: Cardiology

## 2016-04-23 NOTE — Telephone Encounter (Signed)
Patient called to report worsening SOB on exertion x 2 months.  She decided to call because while hanging curtains this morning, she became very SOB. She has since calmed down and has no symptoms.  She reports no swelling or nausea, but does report she has chest discomfort that feels like indigestion "every now and then." She has no VS to report and she has been taking her medications as directed.  Scheduled patient for evaluation tomorrow morning with Dr. Radford Pax.  She was grateful for assistance.

## 2016-04-23 NOTE — Telephone Encounter (Signed)
New message  Pt c/o Shortness Of Breath: STAT if SOB developed within the last 24 hours or pt is noticeably SOB on the phone  1. Are you currently SOB (can you hear that pt is SOB on the phone)? yes  2. How long have you been experiencing SOB? 2 months   3. Are you SOB when sitting or when up moving around? Moving around   4. Are you currently experiencing any other symptoms? No, but if to much moving chest discomfort. Please call back to discuss

## 2016-04-24 ENCOUNTER — Ambulatory Visit (INDEPENDENT_AMBULATORY_CARE_PROVIDER_SITE_OTHER): Payer: Medicare Other | Admitting: Cardiology

## 2016-04-24 ENCOUNTER — Encounter: Payer: Self-pay | Admitting: Cardiology

## 2016-04-24 VITALS — BP 130/80 | HR 63 | Ht 64.0 in | Wt 234.8 lb

## 2016-04-24 DIAGNOSIS — I2583 Coronary atherosclerosis due to lipid rich plaque: Principal | ICD-10-CM

## 2016-04-24 DIAGNOSIS — R0609 Other forms of dyspnea: Secondary | ICD-10-CM | POA: Diagnosis not present

## 2016-04-24 DIAGNOSIS — I2510 Atherosclerotic heart disease of native coronary artery without angina pectoris: Secondary | ICD-10-CM

## 2016-04-24 DIAGNOSIS — E785 Hyperlipidemia, unspecified: Secondary | ICD-10-CM

## 2016-04-24 DIAGNOSIS — I10 Essential (primary) hypertension: Secondary | ICD-10-CM

## 2016-04-24 DIAGNOSIS — I5032 Chronic diastolic (congestive) heart failure: Secondary | ICD-10-CM | POA: Diagnosis not present

## 2016-04-24 LAB — LIPID PANEL
CHOL/HDL RATIO: 2.1 ratio (ref <=5.0)
Cholesterol: 120 mg/dL — ABNORMAL LOW (ref 125–200)
HDL: 58 mg/dL (ref >=46)
LDL Cholesterol: 36 mg/dL (ref <130)
TRIGLYCERIDES: 130 mg/dL (ref <150)
VLDL: 26 mg/dL (ref <30)

## 2016-04-24 LAB — CBC WITH DIFFERENTIAL/PLATELET
BASOS PCT: 1 %
Basophils Absolute: 52 cells/uL (ref 0–200)
EOS PCT: 4 %
Eosinophils Absolute: 208 cells/uL (ref 15–500)
HCT: 39.1 % (ref 35.0–45.0)
Hemoglobin: 13.1 g/dL (ref 11.7–15.5)
Lymphocytes Relative: 29 %
Lymphs Abs: 1508 cells/uL (ref 850–3900)
MCH: 32.2 pg (ref 27.0–33.0)
MCHC: 33.5 g/dL (ref 32.0–36.0)
MCV: 96.1 fL (ref 80.0–100.0)
MONOS PCT: 13 %
MPV: 11.6 fL (ref 7.5–12.5)
Monocytes Absolute: 676 cells/uL (ref 200–950)
NEUTROS ABS: 2756 {cells}/uL (ref 1500–7800)
Neutrophils Relative %: 53 %
PLATELETS: 216 10*3/uL (ref 140–400)
RBC: 4.07 MIL/uL (ref 3.80–5.10)
RDW: 14.2 % (ref 11.0–15.0)
WBC: 5.2 10*3/uL (ref 3.8–10.8)

## 2016-04-24 LAB — HEPATIC FUNCTION PANEL
ALK PHOS: 53 U/L (ref 33–130)
ALT: 15 U/L (ref 6–29)
AST: 20 U/L (ref 10–35)
Albumin: 4.1 g/dL (ref 3.6–5.1)
BILIRUBIN INDIRECT: 0.6 mg/dL (ref 0.2–1.2)
BILIRUBIN TOTAL: 0.8 mg/dL (ref 0.2–1.2)
Bilirubin, Direct: 0.2 mg/dL (ref <=0.2)
TOTAL PROTEIN: 6.1 g/dL (ref 6.1–8.1)

## 2016-04-24 LAB — BASIC METABOLIC PANEL
BUN: 19 mg/dL (ref 7–25)
CALCIUM: 9 mg/dL (ref 8.6–10.4)
CHLORIDE: 107 mmol/L (ref 98–110)
CO2: 25 mmol/L (ref 20–31)
Creat: 0.76 mg/dL (ref 0.60–0.93)
Glucose, Bld: 109 mg/dL — ABNORMAL HIGH (ref 65–99)
Potassium: 4.3 mmol/L (ref 3.5–5.3)
Sodium: 140 mmol/L (ref 135–146)

## 2016-04-24 LAB — TSH: TSH: 3.96 m[IU]/L

## 2016-04-24 LAB — BRAIN NATRIURETIC PEPTIDE: Brain Natriuretic Peptide: 54.6 pg/mL (ref <100)

## 2016-04-24 NOTE — Patient Instructions (Signed)
Medication Instructions:  Your physician recommends that you continue on your current medications as directed. Please refer to the Current Medication list given to you today.   Labwork: TODAY: BMET, CBC, TSH, LFTs, BNP, lipids  Testing/Procedures: Your physician has requested that you have a lexiscan myoview. For further information please visit [email or web address]. Please follow instruction sheet, as given.  Your physician has requested that you have an echocardiogram. Echocardiography is a painless test that uses sound waves to create images of your heart. It provides your doctor with information about the size and shape of your heart and how well your heart's chambers and valves are working. This procedure takes approximately one hour. There are no restrictions for this procedure.  Follow-Up: Your physician wants you to follow-up in: 6 months with Dr. Radford Pax. You will receive a reminder letter in the mail two months in advance. If you don't receive a letter, please call our office to schedule the follow-up appointment.   Any Other Special Instructions Will Be Listed Below (If Applicable).     If you need a refill on your cardiac medications before your next appointment, please call your pharmacy.

## 2016-04-24 NOTE — Addendum Note (Signed)
Addended by: Fransico Him R on: 04/24/2016 08:59 AM   Modules accepted: Miquel Dunn

## 2016-04-24 NOTE — Progress Notes (Addendum)
Cardiology Office Note    Date:  04/24/2016   ID:  Brittany Robertson, DOB [birth year], MRN [id number]  PCP:  Mayra Neer, MD  Cardiologist:  Fransico Him, MD   Chief Complaint  Patient presents with  . Coronary Artery Disease  . Shortness of Breath  . Hypertension    History of Present Illness:  Brittany Robertson is a 75 y.o. female with a history of ASCAD/HTN/obesity and dyslipidemia who presents back for followup. She has had some SOB in the past and her nuclear stress test was low risk but due to ongoing SOB with some chest pain she underwent cath showing patent LAD stent and otherwise normal coronary arteries in 2014. Cath did show mild diastolic dysfunction and she was started on Laix and her beta blocker was continued. She is here today because she has had a reoccurence of her SOB over the past 3-4 months.  She says it is identical to when she had her initial stent.  She also has been having some discomfort in her chest mainly with exertion.  The discomfort does not radiate and is midsternal.  She denies any nausea with the discomfort but thinks that she may get diaphoretic at times.  The chest discomfort occurs several times weekly and usually lasts 15-20 minutes and resolves after resting. She has not taken lasix in some time.   She denies any LE edema or syncope.  She occasionally will feel lightheaded.    Past Medical History  Diagnosis Date  . Hypertension   . Degenerative disk disease   . Obesity   . Hyperlipidemia   . GERD (gastroesophageal reflux disease)   . OA (osteoarthritis)   . Osteopenia   . PVC's (premature ventricular contractions)   . Coronary artery disease     pci 2006, repeat cath 30% mid LAD distal to stent otherwise normal coronary arteries, cath 08/2013 with patent LAD stent and otherwise normal coronary arteries with diastolic dysfunction  . DOE (dyspnea on exertion)     chronic secondary to obesity, deconditiong and chronic diastolic CHF  . Chronic  diastolic CHF (congestive heart failure) ([place])   . Complication of anesthesia     "takes a long time towake up"  . DDD (degenerative disc disease), cervical   . Bruises easily   . PONV (postoperative nausea and vomiting)     Motion sickness when move out of PACU to room    Past Surgical History  Procedure Laterality Date  . Knee arthroscopy    . Stents      x1  . Left heart catheterization with coronary angiogram N/A 09/19/2013    Procedure: LEFT HEART CATHETERIZATION WITH CORONARY ANGIOGRAM;  Surgeon: Sueanne Margarita, MD;  Location: [place] [facility];  Service: Cardiovascular;  Laterality: N/A;  . Tonsillectomy      as child  . Appendectomy  1957  . Hemorrhoid surgery  1969  . Dilation and curettage of uterus  1972  . Abdominal hysterectomy    . Cholecystectomy  1988  . Tear duct surgery  1990's    bilateral  . Cyst removal hand  2000  . Arthroscopic knee  05/2002,04/07/2005    right and then also torn muscle surgery on right knee (2003(  . Arthroscopic knee  01/02/2005,09/21/2006    left  . Coronary angioplasty  02/05/2004  . Eye surgery  01/2007    bilateral cataract with lens implant  . Hernia repair  02/2010    incisional hernioa  . Hand surgery  05/29/2011    joint removed  . Neck injections  03/2014    back and neck injections from Dr. Nelva Bush  . Foot surgery  01/2012    left and right bunion and hammer toe  . Colonoscopy  2010    with Endo  . Total knee arthroplasty Left 01/04/2015    Procedure: LEFT TOTAL KNEE ARTHROPLASTY;  Surgeon: Sydnee Cabal, MD;  Location: [facility];  Service: Orthopedics;  Laterality: Left;  . Total knee arthroplasty Right 05/10/2015    Procedure: TOTAL RIGHT KNEE ARTHROPLASTY;  Surgeon: Sydnee Cabal, MD;  Location: [facility];  Service: Orthopedics;  Laterality: Right;    Current Medications: Outpatient Prescriptions Prior to Visit  Medication Sig Dispense Refill  . Calcium Carbonate-Vitamin D (CALCIUM + D PO) Take 1 tablet by mouth 2 (two) times  daily. Sun, Tues, Thurs, Sat    . cetirizine (ZYRTEC) 10 MG tablet Take 10 mg by mouth every morning.      . cholecalciferol (VITAMIN D) 400 UNITS TABS Take 400 Units by mouth 2 (two) times daily.     . Coenzyme Q10 Liposomal 100 MG/ML LIQD Take 100 mg by mouth every evening.    . denosumab (PROLIA) 60 MG/ML SOLN injection Inject 60 mg into the skin every 6 (six) months. Administer in upper arm, thigh, or abdomen    . docusate sodium (COLACE) 100 MG capsule Take 100 mg by mouth 2 (two) times daily.     Marland Kitchen esomeprazole (NEXIUM) 40 MG capsule Take 40 mg by mouth daily before breakfast.      . ezetimibe (ZETIA) 10 MG tablet Take 1 tablet (10 mg total) by mouth every evening. 90 tablet 3  . fish oil-omega-3 fatty acids 1000 MG capsule Take 1 g by mouth 2 (two) times daily.      . furosemide (LASIX) 20 MG tablet Take 20 mg by mouth daily as needed for fluid or edema.     . meloxicam (MOBIC) 15 MG tablet Take 15 mg by mouth every evening.      . metoprolol succinate (TOPROL-XL) 25 MG 24 hr tablet Take 1 tablet by mouth  daily 90 tablet 0  . mirabegron ER (MYRBETRIQ) 25 MG TB24 tablet Take 25 mg by mouth every evening.    . Misc Natural Products (FLEX-A-MIN JOINT FLEX) TABS Take 1 tablet by mouth 2 (two) times daily.    . Nasal Dilators (BREATHE RIGHT SMALL/MEDIUM) STRP 1 each by Does not apply route at bedtime as needed (congestion).    . nitroGLYCERIN (NITROSTAT) 0.4 MG SL tablet Place 1 tablet (0.4 mg total) under the tongue every 5 (five) minutes as needed. As needed for chest pain x 3 doses 25 tablet 1  . olmesartan (BENICAR) 40 MG tablet Take 1 tablet (40 mg total) by mouth daily. 90 tablet 3  . Polyethyl Glycol-Propyl Glycol (SYSTANE ULTRA OP) Apply 1 drop to eye 2 (two) times daily as needed (dry eyes).    . potassium chloride SA (K-DUR,KLOR-CON) 20 MEQ tablet Take 1 tablet by mouth  every morning 90 tablet 0  . vitamin C (ASCORBIC ACID) 500 MG tablet Take 500 mg by mouth at bedtime.     .  vitamin E 400 UNIT capsule Take 400 Units by mouth at bedtime.     Marland Kitchen aspirin EC 325 MG tablet Take 1 tablet (325 mg total) by mouth 2 (two) times daily. 60 tablet 0  . atorvastatin (LIPITOR) 20 MG tablet Take 1 tablet (20 mg total) by  mouth daily. BRAND NAME ONLY due to ALLERGY (Patient taking differently: Take 20 mg by mouth at bedtime. BRAND NAME ONLY due to ALLERGY) 90 tablet 3  . ferrous sulfate 325 (65 FE) MG tablet Take 1 tablet (325 mg total) by mouth 3 (three) times daily after meals. (Patient not taking: Reported on 05/03/2015) 90 tablet 0  . methocarbamol (ROBAXIN) 500 MG tablet Take 1 tablet (500 mg total) by mouth 4 (four) times daily. 50 tablet 2  . oxyCODONE-acetaminophen (ROXICET) 5-325 MG per tablet Take 1-2 tablets by mouth every 4 (four) hours as needed for severe pain. 60 tablet 0  . sodium chloride (OCEAN) 0.65 % SOLN nasal spray Place 1 spray into both nostrils daily as needed for congestion.     No facility-administered medications prior to visit.     Allergies:   Atorvastatin; Codeine; Sulfa antibiotics; Adhesive; and Penicillins   Social History   Social History  . Marital Status: Married    Spouse Name: N/A  . Number of Children: N/A  . Years of Education: N/A   Social History Main Topics  . Smoking status: Never Smoker   . Smokeless tobacco: None  . Alcohol Use: 0.6 oz/week    1 Glasses of wine per week     Comment: occassionally  . Drug Use: No  . Sexual Activity: Not Asked   Other Topics Concern  . None   Social History Narrative     Family History:  The patient's family history includes Cancer in her mother; Heart Problems in her father; Hypertension in her father and mother.   ROS:   Please see the history of present illness.    ROS All other systems reviewed and are negative.   PHYSICAL EXAM:   VS:  BP 130/80 mmHg  Pulse 63  Ht 5\' 4"  (1.626 m)  Wt 234 lb 12.8 oz (106.505 kg)  BMI 40.28 kg/m2   GEN: Well nourished, well developed, in no  acute distress HEENT: normal Neck: no JVD, carotid bruits, or masses Cardiac: RRR; no murmurs, rubs, or gallops,no edema.  Intact distal pulses bilaterally.  Respiratory:  clear to auscultation bilaterally, normal work of breathing GI: soft, nontender, nondistended, + BS MS: no deformity or atrophy Skin: warm and dry, no rash Neuro:  Alert and Oriented x 3, Strength and sensation are intact Psych: euthymic mood, full affect  Wt Readings from Last 3 Encounters:  04/24/16 234 lb 12.8 oz (106.505 kg)  05/10/15 216 lb 0.8 oz (98 kg)  05/03/15 218 lb (98.884 kg)      Studies/Labs Reviewed:   EKG:  EKG is ordered today.  The ekg ordered today demonstrates NSR with PACs and PVCs with no ST changes, LVH by voltage  Recent Labs: 05/11/2015: BUN 19; Creatinine, Ser 0.70; Potassium 4.7; Sodium 133* 05/12/2015: Hemoglobin 10.7*; Platelets 187   Lipid Panel    Component Value Date/Time   CHOL 119 12/07/2014 0917   TRIG 109.0 12/07/2014 0917   HDL 41.10 12/07/2014 0917   CHOLHDL 3 12/07/2014 0917   VLDL 21.8 12/07/2014 0917   LDLCALC 56 12/07/2014 0917    Additional studies/ records that were reviewed today include:  none    ASSESSMENT:    1. Coronary artery disease due to lipid rich plaque   2. Chronic diastolic CHF (congestive heart failure) ([place])   3. Essential hypertension   4. DOE (dyspnea on exertion)   5. Dyslipidemia, goal LDL below 70      PLAN:  In order  of problems listed above:  1. ASCAD with last cath in 2014 with patent stent.  Continue ASA/statin/BB. 2. Chronic diastolic CHF - she appears euvolemic on exam.  Continue BB/ARB and PRN Lasix. 3. HTN - BP controlled on current medical regimen.  Continue BB/ARB 4. DOE - ? Etiology.  She has had SOB in the past felt to be due to deconditioning and obesity.  She has not had any ischemic workup in several years so will get a [employer].  I will also check an echo to assess LVF.  Check BNP today to rule out volume  overload. I will also get a TSH to rule out hypothyroidism and check CBC to rule out anemia. 5. Dyslipidemia - LDL goal < 70.  Last LDL 56.  I will check FLP and ALT today as she is fasting.    Medication Adjustments/Labs and Tests Ordered: Current medicines are reviewed at length with the patient today.  Concerns regarding medicines are outlined above.  Medication changes, Labs and Tests ordered today are listed in the Patient Instructions below.  There are no Patient Instructions on file for this visit.   Signed, Fransico Him, MD  04/24/2016 8:14 AM    [facility] [place], [place], [place]  [address] Phone: [phone]; Fax: [phone]

## 2016-05-12 ENCOUNTER — Telehealth (HOSPITAL_COMMUNITY): Payer: Self-pay | Admitting: *Deleted

## 2016-05-12 NOTE — Telephone Encounter (Signed)
Left message on voicemail per DPR in reference to upcoming appointment scheduled on 05/13/16 at 1230 with detailed instructions given per Myocardial Perfusion Study Information Sheet for the test. LM to arrive 15 minutes early, and that it is imperative to arrive on time for appointment to keep from having the test rescheduled. If you need to cancel or reschedule your appointment, please call the office within 24 hours of your appointment. Failure to do so may result in a cancellation of your appointment, and a $50 no show fee. Phone number given for call back for any questions.

## 2016-05-13 ENCOUNTER — Ambulatory Visit (HOSPITAL_COMMUNITY): Payer: Medicare Other | Attending: Cardiovascular Disease

## 2016-05-13 ENCOUNTER — Other Ambulatory Visit (HOSPITAL_COMMUNITY): Payer: Self-pay

## 2016-05-13 ENCOUNTER — Ambulatory Visit (HOSPITAL_BASED_OUTPATIENT_CLINIC_OR_DEPARTMENT_OTHER): Payer: Medicare Other

## 2016-05-13 DIAGNOSIS — Z6841 Body Mass Index (BMI) 40.0 and over, adult: Secondary | ICD-10-CM | POA: Insufficient documentation

## 2016-05-13 DIAGNOSIS — I5032 Chronic diastolic (congestive) heart failure: Secondary | ICD-10-CM

## 2016-05-13 DIAGNOSIS — E669 Obesity, unspecified: Secondary | ICD-10-CM | POA: Insufficient documentation

## 2016-05-13 DIAGNOSIS — E785 Hyperlipidemia, unspecified: Secondary | ICD-10-CM | POA: Insufficient documentation

## 2016-05-13 DIAGNOSIS — I110 Hypertensive heart disease with heart failure: Secondary | ICD-10-CM | POA: Insufficient documentation

## 2016-05-13 DIAGNOSIS — R0609 Other forms of dyspnea: Secondary | ICD-10-CM | POA: Diagnosis not present

## 2016-05-13 MED ORDER — REGADENOSON 0.4 MG/5ML IV SOLN
0.4000 mg | Freq: Once | INTRAVENOUS | Status: AC
Start: 2016-05-13 — End: 2016-05-13
  Administered 2016-05-13: 0.4 mg via INTRAVENOUS

## 2016-05-13 MED ORDER — TECHNETIUM TC 99M TETROFOSMIN IV KIT
32.1000 | PACK | Freq: Once | INTRAVENOUS | Status: AC | PRN
  Administered 2016-05-13: 32.1 via INTRAVENOUS
  Filled 2016-05-13: qty 32

## 2016-05-13 MED ORDER — PERFLUTREN LIPID MICROSPHERE
1.0000 mL | INTRAVENOUS | Status: AC | PRN
  Administered 2016-05-13: 3 mL via INTRAVENOUS

## 2016-05-14 ENCOUNTER — Telehealth: Payer: Self-pay | Admitting: Cardiology

## 2016-05-14 ENCOUNTER — Ambulatory Visit (HOSPITAL_COMMUNITY): Payer: Medicare Other | Attending: Cardiology

## 2016-05-14 LAB — MYOCARDIAL PERFUSION IMAGING
LV dias vol: 111 mL (ref 46–106)
LV sys vol: 41 mL
Peak HR: 93 {beats}/min
RATE: 0.29
Rest HR: 52 {beats}/min
SDS: 6
SRS: 1
SSS: 7
TID: 0.92

## 2016-05-14 MED ORDER — TECHNETIUM TC 99M TETROFOSMIN IV KIT
33.0000 | PACK | Freq: Once | INTRAVENOUS | Status: AC | PRN
  Administered 2016-05-14: 33 via INTRAVENOUS
  Filled 2016-05-14: qty 33

## 2016-05-14 NOTE — Telephone Encounter (Signed)
Will forward to [facility] RN, who left message for patient to call back.

## 2016-05-14 NOTE — Telephone Encounter (Signed)
F/u message  Pt states she received a call from the office stating to return the call. Please call back to discuss

## 2016-05-18 ENCOUNTER — Telehealth: Payer: Self-pay | Admitting: Cardiology

## 2016-05-18 DIAGNOSIS — R0602 Shortness of breath: Secondary | ICD-10-CM

## 2016-05-18 NOTE — Telephone Encounter (Signed)
Spoke with pt. She reports echo and stress test results have been reviewed with her and she is wondering what the next step is.  She states she continues to have shortness of breath. Will occur with activities such as walking, folding clothes or taking a shower.  Also complains of times where left upper chest will hurt. Not associated with shortness of breath. Lasts about 20-30 minutes and goes away on it's own.  Last episode was 8 days ago when sitting in Sunday school. Will forward to Dr. Radford Pax for review/recommendations.

## 2016-05-18 NOTE — Telephone Encounter (Signed)
New message     The pt had a stress test done last week and the pt states she understood it to show no leaking valves or anything, so the pt wants to know "What's the next step"

## 2016-05-18 NOTE — Telephone Encounter (Signed)
Cardiac workup is normal including BNP.  ? Etiology of SOB.  Please get chest xray and PFTs.  If these are normal then will order cardiopulmonary stress test.

## 2016-05-19 NOTE — Telephone Encounter (Signed)
Patient agrees to PFTs and chest Xray.  She understands she will be called to schedule PFTs and she can get Xray done at same time at [facility]. Patient agrees with treatment plan.

## 2016-05-21 NOTE — Telephone Encounter (Signed)
PFTs are scheduled 8/7.

## 2016-05-25 ENCOUNTER — Ambulatory Visit (HOSPITAL_COMMUNITY)
Admission: RE | Admit: 2016-05-25 | Discharge: 2016-05-25 | Disposition: A | Discharge status: Home / Self Care | Payer: Medicare Other | Source: Ambulatory Visit | Attending: Cardiology | Admitting: Cardiology

## 2016-05-25 ENCOUNTER — Ambulatory Visit (INDEPENDENT_AMBULATORY_CARE_PROVIDER_SITE_OTHER)
Admission: RE | Admit: 2016-05-25 | Discharge: 2016-05-25 | Disposition: A | Discharge status: Home / Self Care | Payer: Medicare Other | Source: Ambulatory Visit | Attending: Cardiology | Admitting: Cardiology

## 2016-05-25 DIAGNOSIS — R0602 Shortness of breath: Secondary | ICD-10-CM

## 2016-05-25 LAB — PULMONARY FUNCTION TEST
DL/VA % pred: 78 %
DL/VA: 3.76 ml/min/mmHg/L
DLCO UNC % PRED: 61 %
DLCO unc: 14.85 ml/min/mmHg
FEF 25-75 PRE: 2 L/s
FEF 25-75 Post: 2.74 L/sec
FEF2575-%Change-Post: 36 %
FEF2575-%PRED-POST: 176 %
FEF2575-%PRED-PRE: 129 %
FEV1-%CHANGE-POST: 9 %
FEV1-%PRED-PRE: 100 %
FEV1-%Pred-Post: 109 %
FEV1-Post: 2.24 L
FEV1-Pre: 2.05 L
FEV1FVC-%Change-Post: 5 %
FEV1FVC-%PRED-PRE: 107 %
FEV6-%Change-Post: 4 %
FEV6-%PRED-POST: 103 %
FEV6-%PRED-PRE: 99 %
FEV6-POST: 2.67 L
FEV6-PRE: 2.57 L
FEV6FVC-%CHANGE-POST: 0 %
FEV6FVC-%PRED-POST: 105 %
FEV6FVC-%Pred-Pre: 104 %
FVC-%Change-Post: 3 %
FVC-%PRED-POST: 98 %
FVC-%Pred-Pre: 94 %
FVC-PRE: 2.59 L
FVC-Post: 2.68 L
POST FEV1/FVC RATIO: 84 %
PRE FEV1/FVC RATIO: 79 %
PRE FEV6/FVC RATIO: 99 %
Post FEV6/FVC ratio: 100 %
RV % pred: 80 %
RV: 1.87 L
TLC % PRED: 93 %
TLC: 4.71 L

## 2016-05-25 MED ORDER — ALBUTEROL SULFATE (2.5 MG/3ML) 0.083% IN NEBU
2.5000 mg | INHALATION_SOLUTION | Freq: Once | RESPIRATORY_TRACT | Status: AC
  Administered 2016-05-25: 2.5 mg via RESPIRATORY_TRACT

## 2016-05-26 ENCOUNTER — Telehealth: Payer: Self-pay | Admitting: Cardiology

## 2016-05-26 DIAGNOSIS — R942 Abnormal results of pulmonary function studies: Secondary | ICD-10-CM

## 2016-05-26 NOTE — Telephone Encounter (Signed)
-----   Message from Sueanne Margarita, MD sent at 05/25/2016  8:31 PM EDT ----- PFTs with moderate diffusing capacity decline.  This could indicate interstitial lung disease.  Please refer to Dr. Chase Caller

## 2016-05-26 NOTE — Telephone Encounter (Signed)
-----   Message from Sueanne Margarita, MD sent at 05/25/2016  2:33 PM EDT ----- Mild chronic interstitial lung disease - please refer to Dr. Chase Caller

## 2016-05-26 NOTE — Telephone Encounter (Signed)
New message    Pt is calling verbalized that she is returning call for RN for test results 05/25/16

## 2016-05-26 NOTE — Telephone Encounter (Signed)
Informed patient of results and verbal understanding expressed.  Referral placed. Patient was grateful for call.

## 2016-06-29 ENCOUNTER — Ambulatory Visit (INDEPENDENT_AMBULATORY_CARE_PROVIDER_SITE_OTHER): Payer: Medicare Other | Admitting: Internal Medicine

## 2016-06-29 ENCOUNTER — Encounter: Payer: Self-pay | Admitting: Internal Medicine

## 2016-06-29 VITALS — BP 128/86 | HR 64 | Ht 64.0 in | Wt 235.0 lb

## 2016-06-29 DIAGNOSIS — R942 Abnormal results of pulmonary function studies: Secondary | ICD-10-CM | POA: Diagnosis not present

## 2016-06-29 DIAGNOSIS — Z23 Encounter for immunization: Secondary | ICD-10-CM

## 2016-06-29 DIAGNOSIS — R0689 Other abnormalities of breathing: Secondary | ICD-10-CM

## 2016-06-29 DIAGNOSIS — R0989 Other specified symptoms and signs involving the circulatory and respiratory systems: Secondary | ICD-10-CM | POA: Diagnosis not present

## 2016-06-29 DIAGNOSIS — R0600 Dyspnea, unspecified: Secondary | ICD-10-CM | POA: Diagnosis not present

## 2016-06-29 NOTE — Progress Notes (Signed)
Subjective:    Patient ID: Brittany Robertson, female    DOB: 05/25/1941, 75 y.o.   MRN: [id number]  PCP Mayra Neer, MD   HPI   IOV 06/29/2016  Chief Complaint  Patient presents with  . Pulmonary Consult    Pt referred by Dr. Fransico Him for abnormal PFT. Pt c/o DOE with little activity, dry cough and left upper chest pain when SOB. Pt saw cardiology in 04/2016 and did stress test and echo.      Brittany Robertson is a 75 y.o. female who is a resident of [place]. She presents with a husband because of insidious onset of shortness of breath for last 9 months. She tells me that in 2005 she had similar shortness of breath and at that time had coronary stent placed and shortness of breath resolved. Then this time dyspnea has recurred some 6-9 months ago. Since then it is been progressive. It is a moderate intensity. It is associated with a mild dry cough. This also progressive. She went to cardiologist Dr. Radford Pax who did cardiac stress test 05/14/2016 and this was low risk. Patient then had pulmonary function test which showed isolated reduction in diffusion capacity of 14.85/61%. Spirometry and total lung capacity normal. I personally visualized the trace. Chest x-ray done 05/25/2016 that I personally visualized shows possibility of interstitial lung disease. She does have a CT chest in 2012 done during the time of trauma that shows some infiltrates. She and her husband now wants this evaluated. Most recent hemoglobin 04/24/2016 is normal at 13.1 g percent. Creatinine is normal at 0.76 mg percent.     has a past medical history of Bruises easily; Chronic diastolic CHF (congestive heart failure) ([place]); Complication of anesthesia; Coronary artery disease; DDD (degenerative disc disease), cervical; Degenerative disk disease; DOE (dyspnea on exertion); GERD (gastroesophageal reflux disease); Hyperlipidemia; Hypertension; OA (osteoarthritis); Obesity; Osteopenia; PONV (postoperative nausea and  vomiting); and PVC's (premature ventricular contractions).   reports that she has never smoked. She has never used smokeless tobacco.  Past Surgical History:  Procedure Laterality Date  . ABDOMINAL HYSTERECTOMY    . APPENDECTOMY  1957  . arthroscopic knee  05/2002,04/07/2005   right and then also torn muscle surgery on right knee (2003(  . arthroscopic knee  01/02/2005,09/21/2006   left  . CHOLECYSTECTOMY  1988  . COLONOSCOPY  2010   with Endo  . CORONARY ANGIOPLASTY  02/05/2004  . CYST REMOVAL HAND  2000  . [place] OF UTERUS  1972  . EYE SURGERY  01/2007   bilateral cataract with lens implant  . FOOT SURGERY  01/2012   left and right bunion and hammer toe  . HAND SURGERY  05/29/2011   joint removed  . [place]  . HERNIA REPAIR  02/2010   incisional hernioa  . KNEE ARTHROSCOPY    . LEFT HEART CATHETERIZATION WITH CORONARY ANGIOGRAM N/A 09/19/2013   Procedure: LEFT HEART CATHETERIZATION WITH CORONARY ANGIOGRAM;  Surgeon: Sueanne Margarita, MD;  Location: [place] [facility];  Service: Cardiovascular;  Laterality: N/A;  . neck injections  03/2014   back and neck injections from Dr. Nelva Bush  . stents     x1  . tear duct surgery  1990's   bilateral  . TONSILLECTOMY     as child  . TOTAL KNEE ARTHROPLASTY Left 01/04/2015   Procedure: LEFT TOTAL KNEE ARTHROPLASTY;  Surgeon: Sydnee Cabal, MD;  Location: [facility];  Service: Orthopedics;  Laterality: Left;  . TOTAL  KNEE ARTHROPLASTY Right 05/10/2015   Procedure: TOTAL RIGHT KNEE ARTHROPLASTY;  Surgeon: Sydnee Cabal, MD;  Location: [facility];  Service: Orthopedics;  Laterality: Right;    Allergies  Allergen Reactions  . Atorvastatin Other (See Comments)    GENERIC causes severe skin rash but can take Brand Name  . Codeine Nausea And Vomiting  . Sulfa Antibiotics Hives and Itching  . Adhesive [Tape] Itching, Rash and Other (See Comments)    Skin irritation  . Penicillins Itching and Rash    Immunization  History  Administered Date(s) Administered  . Influenza,inj,Quad PF,36+ Mos 05/20/2015  . Pneumococcal Conjugate-13 10/19/2013    Family History  Problem Relation Age of Onset  . Hypertension Mother   . Cancer Mother   . Hypertension Father   . Heart Problems Father      Current Outpatient Prescriptions:  .  aspirin 81 MG tablet, Take 81 mg by mouth daily., Disp: , Rfl:  .  atorvastatin (LIPITOR) 20 MG tablet, Take 20 mg by mouth at bedtime., Disp: , Rfl:  .  Calcium Carbonate-Vitamin D (CALCIUM + D PO), Take 1 tablet by mouth 2 (two) times daily. Sun, [place], Thurs, Sat, Disp: , Rfl:  .  cetirizine (ZYRTEC) 10 MG tablet, Take 10 mg by mouth every morning.  , Disp: , Rfl:  .  cholecalciferol (VITAMIN D) 400 UNITS TABS, Take 400 Units by mouth 2 (two) times daily. , Disp: , Rfl:  .  Coenzyme Q10 Liposomal 100 MG/ML LIQD, Take 100 mg by mouth every evening., Disp: , Rfl:  .  denosumab (PROLIA) 60 MG/ML SOLN injection, Inject 60 mg into the skin every 6 (six) months. Administer in upper arm, thigh, or abdomen, Disp: , Rfl:  .  docusate sodium (COLACE) 100 MG capsule, Take 100 mg by mouth 2 (two) times daily. , Disp: , Rfl:  .  esomeprazole (NEXIUM) 40 MG capsule, Take 40 mg by mouth daily before breakfast.  , Disp: , Rfl:  .  ezetimibe (ZETIA) 10 MG tablet, Take 1 tablet (10 mg total) by mouth every evening., Disp: 90 tablet, Rfl: 3 .  fish oil-omega-3 fatty acids 1000 MG capsule, Take 1 g by mouth 2 (two) times daily.  , Disp: , Rfl:  .  furosemide (LASIX) 20 MG tablet, Take 20 mg by mouth daily as needed for fluid or edema. , Disp: , Rfl:  .  meloxicam (MOBIC) 15 MG tablet, Take 15 mg by mouth every evening.  , Disp: , Rfl:  .  methocarbamol (ROBAXIN) 500 MG tablet, Take 500 mg by mouth 4 (four) times daily as needed for muscle spasms., Disp: , Rfl:  .  metoprolol succinate (TOPROL-XL) 25 MG 24 hr tablet, Take 1 tablet by mouth  daily, Disp: 90 tablet, Rfl: 0 .  mirabegron ER (MYRBETRIQ)  25 MG TB24 tablet, Take 25 mg by mouth every evening., Disp: , Rfl:  .  Nasal Dilators (BREATHE RIGHT SMALL/MEDIUM) STRP, 1 each by Does not apply route at bedtime as needed (congestion)., Disp: , Rfl:  .  nitroGLYCERIN (NITROSTAT) 0.4 MG SL tablet, Place 1 tablet (0.4 mg total) under the tongue every 5 (five) minutes as needed. As needed for chest pain x 3 doses, Disp: 25 tablet, Rfl: 1 .  olmesartan (BENICAR) 40 MG tablet, Take 1 tablet (40 mg total) by mouth daily., Disp: 90 tablet, Rfl: 3 .  Polyethyl Glycol-Propyl Glycol (SYSTANE ULTRA OP), Apply 1 drop to eye 2 (two) times daily as needed (dry eyes).,  Disp: , Rfl:  .  potassium chloride SA (K-DUR,KLOR-CON) 20 MEQ tablet, Take 1 tablet by mouth  every morning, Disp: 90 tablet, Rfl: 0 .  vitamin C (ASCORBIC ACID) 500 MG tablet, Take 500 mg by mouth at bedtime. , Disp: , Rfl:  .  vitamin E 400 UNIT capsule, Take 400 Units by mouth at bedtime. , Disp: , Rfl:    Review of Systems  Constitutional: Negative for fever and unexpected weight change.  HENT: Negative for congestion, dental problem, ear pain, nosebleeds, postnasal drip, rhinorrhea, sinus pressure, sneezing, sore throat and trouble swallowing.   Eyes: Negative for redness and itching.  Respiratory: Positive for cough and shortness of breath. Negative for chest tightness and wheezing.   Cardiovascular: Negative for palpitations and leg swelling.  Gastrointestinal: Negative for nausea and vomiting.  Genitourinary: Negative for dysuria.  Musculoskeletal: Negative for joint swelling.  Skin: Negative for rash.  Neurological: Negative for headaches.  Hematological: Does not bruise/bleed easily.  Psychiatric/Behavioral: Negative for dysphoric mood. The patient is not nervous/anxious.        Objective:   Physical Exam  Constitutional: She is oriented to person, place, and time. She appears well-developed and well-nourished. No distress.  HENT:  Head: Normocephalic and atraumatic.    Right Ear: External ear normal.  Left Ear: External ear normal.  Mouth/Throat: Oropharynx is clear and moist. No oropharyngeal exudate.  Eyes: Conjunctivae and EOM are normal. Pupils are equal, round, and reactive to light. Right eye exhibits no discharge. Left eye exhibits no discharge. No scleral icterus.  Neck: Normal range of motion. Neck supple. No JVD present. No tracheal deviation present. No thyromegaly present.  Cardiovascular: Normal rate, regular rhythm, normal heart sounds and intact distal pulses.  Exam reveals no gallop and no friction rub.   No murmur heard. Pulmonary/Chest: Effort normal. No respiratory distress. She has no wheezes. She has rales. She exhibits no tenderness.  Crackles on the right base. The might be crackles on the left base to but I'm not sure  Abdominal: Soft. Bowel sounds are normal. She exhibits no distension and no mass. There is no tenderness. There is no rebound and no guarding.  Musculoskeletal: Normal range of motion. She exhibits no edema or tenderness.  Lymphadenopathy:    She has no cervical adenopathy.  Neurological: She is alert and oriented to person, place, and time. She has normal reflexes. No cranial nerve deficit. She exhibits normal muscle tone. Coordination normal.  Skin: Skin is warm and dry. No rash noted. She is not diaphoretic. No erythema. No pallor.  Psychiatric: She has a normal mood and affect. Her behavior is normal. Judgment and thought content normal.  Vitals reviewed.   Vitals:   06/29/16 1554  BP: 128/86  Pulse: 64  SpO2: 98%  Weight: 235 lb (106.6 kg)  Height: 5\' 4"  (1.626 m)   Estimated body mass index is 40.34 kg/m as calculated from the following:   Height as of this encounter: 5\' 4"  (1.626 m).   Weight as of this encounter: 235 lb (106.6 kg).       Assessment & Plan:     ICD-9-CM ICD-10-CM   1. Dyspnea and respiratory abnormality 786.09 R06.00     R06.89   2. Chest crackles 786.7 R09.89   3. Abnormal  PFT 794.2 R94.2    Given the fact she has shortness of breath without cardiac etiology and possible crackles in the right base and PFT showing reduction in diffusion capacity in the setting of  normal hemoglobin. Therefore we have rule out interstitial lung disease by getting a high-resolution CT chest. If that his interstitial lung disease then we'll proceed with autoimmune workup. On the other hand if interstitial lung diseases ruled out and there is no emphysema on the CT chest or any other abnormality we will proceed with a pulmonary stress test.  She and husband agreement with the plan. She will have high dose flu shot today at our office  We'll also do walking desaturation test   Dr. Brand Males, M.D., [facility].C.P Pulmonary and Critical Care Medicine Staff Physician [facility] Pager: [phone], If no answer or between  15:00h - 7:00h: call [phone]  06/29/2016 4:24 PM

## 2016-06-29 NOTE — Patient Instructions (Addendum)
ICD-9-CM ICD-10-CM   1. Dyspnea and respiratory abnormality 786.09 R06.00     R06.89   2. Chest crackles 786.7 R09.89   3. Abnormal PFT 794.2 R94.2     Do HRCT supine and prone Do walk test on room air 06/29/2016 with forehead probe High dose flu shot 06/29/2016  followup  - will call with CT results to decide next step

## 2016-07-03 ENCOUNTER — Ambulatory Visit (INDEPENDENT_AMBULATORY_CARE_PROVIDER_SITE_OTHER)
Admission: RE | Admit: 2016-07-03 | Discharge: 2016-07-03 | Disposition: A | Discharge status: Home / Self Care | Payer: Medicare Other | Source: Ambulatory Visit | Attending: Internal Medicine | Admitting: Internal Medicine

## 2016-07-03 DIAGNOSIS — R942 Abnormal results of pulmonary function studies: Secondary | ICD-10-CM

## 2016-07-03 DIAGNOSIS — R0689 Other abnormalities of breathing: Secondary | ICD-10-CM | POA: Diagnosis not present

## 2016-07-03 DIAGNOSIS — R0989 Other specified symptoms and signs involving the circulatory and respiratory systems: Secondary | ICD-10-CM

## 2016-07-03 DIAGNOSIS — R0600 Dyspnea, unspecified: Secondary | ICD-10-CM | POA: Diagnosis not present

## 2016-07-07 ENCOUNTER — Telehealth: Payer: Self-pay | Admitting: Internal Medicine

## 2016-07-07 DIAGNOSIS — R911 Solitary pulmonary nodule: Secondary | ICD-10-CM

## 2016-07-07 NOTE — Telephone Encounter (Signed)
Pt aware that MR is not available today and that we will send this message to MR to advise upon his return.   Please advise Dr Chase Caller on CT results. Thanks.

## 2016-07-08 NOTE — Telephone Encounter (Signed)
IMPRESSION: 1. No evidence of interstitial lung disease. 2. Right middle lobe 4 mm solid pulmonary nodule, new since 09/27/2011. No follow-up needed if patient is low-risk. Non-contrast chest CT can be considered in 12 months if patient is high-risk. This recommendation follows the consensus statement: Guidelines for Management of Incidental Pulmonary Nodules Detected on CT Images: From the Fleischner Society 2017; Radiology 2017; 284:228-243. 3. 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 4. Aortic atherosclerosis. 5. **An incidental finding of potential clinical significance has been found. Ascending thoracic aortic 4.2 cm aneurysm, new since 2012. Recommend annual imaging followup by CTA or MRA. This recommendation follows 2010 ACCF/AHA/AATS/ACR/ASA/SCA/SCAI/SIR/STS/SVM Guidelines for the Diagnosis and Management of Patients with Thoracic Aortic Disease. Circulation. 2010; [id number]. ** 6. Left main and 3 vessel coronary atherosclerosis.   Electronically Signed By: Ilona Sorrel M.D. On: 07/03/2016 10:38       Last Resulted: 07/03/16 10:38       Dspnea - no cause found.  No ILD. No emphhysema. So, plan do CPST bike test and return for fu  New - 58mm RML nodule - repeat ct chest wo contrast in 1 year  New - 4.2 thoracic aorta enlargement - they need to talk to Dr Radford Pax cards about this but typically I see that cards keeps an eye on this when she does CT scan  - in her case will be ct chest in 1 year for nodule  Dr. Brand Males, M.D., [facility].C.P Pulmonary and Critical Care Medicine Staff Physician [place] [facility] Pager: [phone], If no answer or between  15:00h - 7:00h: call [phone]  07/08/2016 8:27 AM

## 2016-07-08 NOTE — Telephone Encounter (Signed)
Pt return call. Reviewed MR's results and recs. Pt voiced understanding and had no further questions. Ct order placed. Nothing further needed.

## 2016-07-08 NOTE — Telephone Encounter (Signed)
lmtcb x1 for pt. 

## 2016-07-10 ENCOUNTER — Encounter: Payer: Self-pay | Admitting: Cardiology

## 2016-07-13 ENCOUNTER — Ambulatory Visit (INDEPENDENT_AMBULATORY_CARE_PROVIDER_SITE_OTHER): Payer: Medicare Other | Admitting: Cardiology

## 2016-07-13 ENCOUNTER — Encounter: Payer: Self-pay | Admitting: Cardiology

## 2016-07-13 ENCOUNTER — Telehealth: Payer: Self-pay | Admitting: Internal Medicine

## 2016-07-13 VITALS — BP 177/87 | HR 53 | Ht 64.0 in | Wt 232.8 lb

## 2016-07-13 DIAGNOSIS — I2583 Coronary atherosclerosis due to lipid rich plaque: Principal | ICD-10-CM

## 2016-07-13 DIAGNOSIS — R0689 Other abnormalities of breathing: Principal | ICD-10-CM

## 2016-07-13 DIAGNOSIS — R0602 Shortness of breath: Secondary | ICD-10-CM | POA: Insufficient documentation

## 2016-07-13 DIAGNOSIS — I5032 Chronic diastolic (congestive) heart failure: Secondary | ICD-10-CM | POA: Diagnosis not present

## 2016-07-13 DIAGNOSIS — R0600 Dyspnea, unspecified: Secondary | ICD-10-CM

## 2016-07-13 DIAGNOSIS — I2510 Atherosclerotic heart disease of native coronary artery without angina pectoris: Secondary | ICD-10-CM

## 2016-07-13 DIAGNOSIS — E785 Hyperlipidemia, unspecified: Secondary | ICD-10-CM | POA: Diagnosis not present

## 2016-07-13 DIAGNOSIS — I10 Essential (primary) hypertension: Secondary | ICD-10-CM | POA: Diagnosis not present

## 2016-07-13 DIAGNOSIS — I77810 Thoracic aortic ectasia: Secondary | ICD-10-CM

## 2016-07-13 HISTORY — DX: Shortness of breath: R06.02

## 2016-07-13 NOTE — Patient Instructions (Signed)
Medication Instructions:  Your physician recommends that you continue on your current medications as directed. Please refer to the Current Medication list given to you today.   Labwork: None  Testing/Procedures: Your physician has requested that you have an echocardiogram in September, 2018. Echocardiography is a painless test that uses sound waves to create images of your heart. It provides your doctor with information about the size and shape of your heart and how well your heart's chambers and valves are working. This procedure takes approximately one hour. There are no restrictions for this procedure.  Follow-Up: Your physician wants you to follow-up in: 6 months with Dr. Theodosia Blender assistant. You will receive a reminder letter in the mail two months in advance. If you don't receive a letter, please call our office to schedule the follow-up appointment.   Your physician wants you to follow-up in: 1 year with Dr. Radford Pax. You will receive a reminder letter in the mail two months in advance. If you don't receive a letter, please call our office to schedule the follow-up appointment.   Any Other Special Instructions Will Be Listed Below (If Applicable).     If you need a refill on your cardiac medications before your next appointment, please call your pharmacy.

## 2016-07-13 NOTE — Telephone Encounter (Signed)
Called and spoke to pt. Informed her of the recs per MR. CPST order placed. Pt verbalized understanding.   PCC's please advise when the CPST is so a f/u appt can be scheduled. Thanks.

## 2016-07-13 NOTE — Telephone Encounter (Signed)
Brittany Robertson  Dr Radford Pax called me stating that patient was told by our office (presuming [facility] based on calls) that problems are cardiac and to go back to Dr Radford Pax. However, what I wanted was a CPST and return back to see me. Please set t his up. Please call me to discuss this   Thanks  Dr. Brand Males, M.D., [facility].C.P Pulmonary and Critical Care Medicine Staff Physician [place] [facility] Pager: [phone], If no answer or between  15:00h - 7:00h: call [phone]  07/13/2016 12:01 PM

## 2016-07-13 NOTE — Progress Notes (Signed)
Cardiology Office Note    Date:  07/13/2016   ID:  Brittany Robertson, DOB June 19, 1941, MRN [id number]  PCP:  Brittany Neer, MD Cardiologist:  Brittany Him, MD   Chief Complaint  Patient presents with  . Coronary Artery Disease  . Hypertension  . Hyperlipidemia  . Shortness of Breath    History of Present Illness:  Brittany Robertson is a 75 y.o. female with a history of ASCAD/HTN/obesity and dyslipidemia who presents back for followup. She has had some SOB in the past and her nuclear stress test was low risk but due to ongoing SOB with some chest pain she underwent cath showing patent LAD stent and otherwise normal coronary arteries in 2014. Cath did show mild diastolic dysfunction and she was started on Laix and her beta blocker was continued.   When she saw me last she complained again of SOB and nuclear stress test was normal and 2D echo was normal and BNP was normal. She was referred to Pulmonary and HR CT scan showed no evidence of ILD.  There was also noted to be mild dilated ascending aortic aneurysm at 4.2cm (increased from 3.8cm 2012).  A CPST with bike was ordered but she has not had that done yet.  There was evidence as well of Left main with 3 vessel CAD noted on chest CT.  She is now referred back from pulmonary.  Past Medical History:  Diagnosis Date  . Bruises easily   . Chronic diastolic CHF (congestive heart failure) ([place])   . Complication of anesthesia    "takes a long time towake up"  . Coronary artery disease    pci 2006, repeat cath 30% mid LAD distal to stent otherwise normal coronary arteries, cath 08/2013 with patent LAD stent and otherwise normal coronary arteries with diastolic dysfunction  . DDD (degenerative disc disease), cervical   . Degenerative disk disease   . DOE (dyspnea on exertion)    chronic secondary to obesity, deconditiong and chronic diastolic CHF  . GERD (gastroesophageal reflux disease)   . Hyperlipidemia   . Hypertension   . OA  (osteoarthritis)   . Obesity   . Osteopenia   . PONV (postoperative nausea and vomiting)    Motion sickness when move out of PACU to room  . PVC's (premature ventricular contractions)   . SOB (shortness of breath) 07/13/2016    Past Surgical History:  Procedure Laterality Date  . ABDOMINAL HYSTERECTOMY    . APPENDECTOMY  1957  . arthroscopic knee  05/2002,04/07/2005   right and then also torn muscle surgery on right knee (2003(  . arthroscopic knee  01/02/2005,09/21/2006   left  . CHOLECYSTECTOMY  1988  . COLONOSCOPY  2010   with Endo  . CORONARY ANGIOPLASTY  02/05/2004  . CYST REMOVAL HAND  2000  . [place] OF UTERUS  1972  . EYE SURGERY  01/2007   bilateral cataract with lens implant  . FOOT SURGERY  01/2012   left and right bunion and hammer toe  . HAND SURGERY  05/29/2011   joint removed  . [place]  . HERNIA REPAIR  02/2010   incisional hernioa  . KNEE ARTHROSCOPY    . LEFT HEART CATHETERIZATION WITH CORONARY ANGIOGRAM N/A 09/19/2013   Procedure: LEFT HEART CATHETERIZATION WITH CORONARY ANGIOGRAM;  Surgeon: Brittany Margarita, MD;  Location: [facility];  Service: Cardiovascular;  Laterality: N/A;  . neck injections  03/2014   back and neck injections from Dr.  Ramos  . stents     x1  . tear duct surgery  1990's   bilateral  . TONSILLECTOMY     as child  . TOTAL KNEE ARTHROPLASTY Left 01/04/2015   Procedure: LEFT TOTAL KNEE ARTHROPLASTY;  Surgeon: Sydnee Cabal, MD;  Location: [facility];  Service: Orthopedics;  Laterality: Left;  . TOTAL KNEE ARTHROPLASTY Right 05/10/2015   Procedure: TOTAL RIGHT KNEE ARTHROPLASTY;  Surgeon: Sydnee Cabal, MD;  Location: [facility];  Service: Orthopedics;  Laterality: Right;    Current Medications: Outpatient Medications Prior to Visit  Medication Sig Dispense Refill  . aspirin 81 MG tablet Take 81 mg by mouth daily.    Marland Kitchen atorvastatin (LIPITOR) 20 MG tablet Take 20 mg by mouth at bedtime.    . Calcium  Carbonate-Vitamin D (CALCIUM + D PO) Take 1 tablet by mouth 2 (two) times daily. Sun, Tues, Thurs, Sat    . cetirizine (ZYRTEC) 10 MG tablet Take 10 mg by mouth every morning.      . cholecalciferol (VITAMIN D) 400 UNITS TABS Take 400 Units by mouth 2 (two) times daily.     . Coenzyme Q10 Liposomal 100 MG/ML LIQD Take 100 mg by mouth every evening.    . denosumab (PROLIA) 60 MG/ML SOLN injection Inject 60 mg into the skin every 6 (six) months. Administer in upper arm, thigh, or abdomen    . docusate sodium (COLACE) 100 MG capsule Take 100 mg by mouth 2 (two) times daily.     Marland Kitchen esomeprazole (NEXIUM) 40 MG capsule Take 40 mg by mouth daily before breakfast.      . ezetimibe (ZETIA) 10 MG tablet Take 1 tablet (10 mg total) by mouth every evening. 90 tablet 3  . fish oil-omega-3 fatty acids 1000 MG capsule Take 1 g by mouth 2 (two) times daily.      . furosemide (LASIX) 20 MG tablet Take 20 mg by mouth daily as needed for fluid or edema.     . meloxicam (MOBIC) 15 MG tablet Take 15 mg by mouth every evening.      . methocarbamol (ROBAXIN) 500 MG tablet Take 500 mg by mouth 4 (four) times daily as needed for muscle spasms.    . metoprolol succinate (TOPROL-XL) 25 MG 24 hr tablet Take 1 tablet by mouth  daily 90 tablet 0  . mirabegron ER (MYRBETRIQ) 25 MG TB24 tablet Take 25 mg by mouth every evening.    . Nasal Dilators (BREATHE RIGHT SMALL/MEDIUM) STRP 1 each by Does not apply route at bedtime as needed (congestion).    . nitroGLYCERIN (NITROSTAT) 0.4 MG SL tablet Place 1 tablet (0.4 mg total) under the tongue every 5 (five) minutes as needed. As needed for chest pain x 3 doses 25 tablet 1  . olmesartan (BENICAR) 40 MG tablet Take 1 tablet (40 mg total) by mouth daily. 90 tablet 3  . Polyethyl Glycol-Propyl Glycol (SYSTANE ULTRA OP) Apply 1 drop to eye 2 (two) times daily as needed (dry eyes).    . potassium chloride SA (K-DUR,KLOR-CON) 20 MEQ tablet Take 1 tablet by mouth  every morning 90 tablet 0    . vitamin C (ASCORBIC ACID) 500 MG tablet Take 500 mg by mouth at bedtime.     . vitamin E 400 UNIT capsule Take 400 Units by mouth at bedtime.      No facility-administered medications prior to visit.      Allergies:   Atorvastatin; Codeine; Sulfa antibiotics; Sulfamethoxazole; Adhesive [tape]; Other;  and Penicillins   Social History   Social History  . Marital status: Married    Spouse name: N/A  . Number of children: N/A  . Years of education: N/A   Social History Main Topics  . Smoking status: Never Smoker  . Smokeless tobacco: Never Used  . Alcohol use 0.6 oz/week    1 Glasses of wine per week     Comment: occassionally  . Drug use: No  . Sexual activity: Not Asked   Other Topics Concern  . None   Social History Narrative  . None     Family History:  The patient's family history includes Cancer in her mother; Heart Problems in her father; Hypertension in her father and mother.   ROS:   Please see the history of present illness.    ROS All other systems reviewed and are negative.  No flowsheet data found.     PHYSICAL EXAM:   VS:  BP (!) 177/87   Pulse (!) 53   Ht 5\' 4"  (1.626 m)   Wt 232 lb 12.8 oz (105.6 kg)   BMI 39.96 kg/m    GEN: Well nourished, well developed, in no acute distress  HEENT: normal  Neck: no JVD, carotid bruits, or masses Cardiac: RRR; no murmurs, rubs, or gallops,no edema.  Intact distal pulses bilaterally.  Respiratory:  clear to auscultation bilaterally, normal work of breathing GI: soft, nontender, nondistended, + BS MS: no deformity or atrophy  Skin: warm and dry, no rash Neuro:  Alert and Oriented x 3, Strength and sensation are intact Psych: euthymic mood, full affect  Wt Readings from Last 3 Encounters:  07/13/16 232 lb 12.8 oz (105.6 kg)  06/29/16 235 lb (106.6 kg)  05/13/16 234 lb (106.1 kg)      Studies/Labs Reviewed:   EKG:  EKG is not ordered today.    Recent Labs: 04/24/2016: ALT 15; Brain Natriuretic  Peptide 54.6; BUN 19; Creat 0.76; Hemoglobin 13.1; Platelets 216; Potassium 4.3; Sodium 140; TSH 3.96   Lipid Panel    Component Value Date/Time   CHOL 120 (L) 04/24/2016 0850   TRIG 130 04/24/2016 0850   HDL 58 04/24/2016 0850   CHOLHDL 2.1 04/24/2016 0850   VLDL 26 04/24/2016 0850   LDLCALC 36 04/24/2016 0850    Additional studies/ records that were reviewed today include:  Pulmonary notes    ASSESSMENT:    1. Coronary artery disease due to lipid rich plaque   2. Chronic diastolic CHF (congestive heart failure) ([place])   3. Essential hypertension   4. Dyslipidemia, goal LDL below 70   5. SOB (shortness of breath)   6. Dilated aortic root (HCC)      PLAN:  In order of problems listed above:  1.  ASCAD with remote PCI of LAD with cath 2014 with 30% LAD distal to the stent.  She denies any chest pain but continues to have SOB with minimal exertion.  Recent nuclear stress test showed no ischemia, normal LVF by echo and normal BNP. 2.  Chronic diastolic CHF - she appears euvolemic on exam.   3.  HTN - BP controlled on current meds. 4.  Dyslipidemia with LDL goal < 70.  Continue statin.  5.  Chronic SOB of ? Etiology. BNP was normal.  2D echo with normal LVF with no diastolic dysfunction although she has a history of diastolic CHF by heart cath.  Seen recently by Pulmonary and Chest CT HR showed no evidence of ILD.  She was supposed to have a CPST bike test but this was not ordered.  I have discussed the case with Dr. Chase Caller.  She will proceed with stress testing and if no etiology such as deconditioning is found then will proceed with right and left heart cath.  She has had SOB for some time.  She has had coronary artery calcifications documented on CT as far back as 2012 with cath in 2014 only showing 30% LAD after the stent otherwise normal coronary arteries.  I suspect her SOB is due to morbid obesity and deconditioning. 6.  Dilated aortic root at 4.3cm by chest CT.  Will repeat  with echo in 1 year     Medication Adjustments/Labs and Tests Ordered: Current medicines are reviewed at length with the patient today.  Concerns regarding medicines are outlined above.  Medication changes, Labs and Tests ordered today are listed in the Patient Instructions below.  Patient Instructions  Medication Instructions:  Your physician recommends that you continue on your current medications as directed. Please refer to the Current Medication list given to you today.   Labwork: None  Testing/Procedures: Your physician has requested that you have an echocardiogram in September, 2018. Echocardiography is a painless test that uses sound waves to create images of your heart. It provides your doctor with information about the size and shape of your heart and how well your heart's chambers and valves are working. This procedure takes approximately one hour. There are no restrictions for this procedure.  Follow-Up: Your physician wants you to follow-up in: 6 months with Dr. Theodosia Blender assistant. You will receive a reminder letter in the mail two months in advance. If you don't receive a letter, please call our office to schedule the follow-up appointment.   Your physician wants you to follow-up in: 1 year with Dr. Radford Pax. You will receive a reminder letter in the mail two months in advance. If you don't receive a letter, please call our office to schedule the follow-up appointment.   Any Other Special Instructions Will Be Listed Below (If Applicable).     If you need a refill on your cardiac medications before your next appointment, please call your pharmacy.      Signed, Brittany Him, MD  07/13/2016 11:58 AM    [address], [place], [place]  [address] Phone: [phone]; Fax: [phone]

## 2016-07-14 NOTE — Telephone Encounter (Signed)
I have called Kamilah at [facility] left her a vm to call pt and schedule her and then to call me back to let me know when scheduled.

## 2016-07-14 NOTE — Telephone Encounter (Signed)
Arbutus Leas called me back from [facility].  She has pt scheduled for cpx on 10/11 at 10:30.  She said pt had concerns due to she had surgery on both knees this year and she wanted [place] to be aware.

## 2016-07-15 NOTE — Telephone Encounter (Signed)
Called and spoke to pt. Appt offered with TP on 10/16 to review CPST, pt prefers to see MR. Appt made with MR on 10.31.17. Pt verbalized understanding and denied any further questions or concerns at this time.

## 2016-07-29 ENCOUNTER — Ambulatory Visit (HOSPITAL_COMMUNITY): Payer: Medicare Other | Attending: Internal Medicine

## 2016-07-29 DIAGNOSIS — R0689 Other abnormalities of breathing: Secondary | ICD-10-CM

## 2016-07-29 DIAGNOSIS — R0602 Shortness of breath: Secondary | ICD-10-CM | POA: Insufficient documentation

## 2016-07-29 DIAGNOSIS — R0600 Dyspnea, unspecified: Secondary | ICD-10-CM

## 2016-07-30 DIAGNOSIS — R0600 Dyspnea, unspecified: Secondary | ICD-10-CM | POA: Diagnosis not present

## 2016-08-10 ENCOUNTER — Telehealth: Payer: Self-pay | Admitting: Internal Medicine

## 2016-08-10 ENCOUNTER — Ambulatory Visit (INDEPENDENT_AMBULATORY_CARE_PROVIDER_SITE_OTHER): Payer: Medicare Other | Admitting: Internal Medicine

## 2016-08-10 ENCOUNTER — Encounter: Payer: Self-pay | Admitting: Internal Medicine

## 2016-08-10 VITALS — BP 136/88 | HR 61 | Ht 64.0 in | Wt 229.0 lb

## 2016-08-10 DIAGNOSIS — R0602 Shortness of breath: Secondary | ICD-10-CM

## 2016-08-10 DIAGNOSIS — Z87898 Personal history of other specified conditions: Secondary | ICD-10-CM | POA: Insufficient documentation

## 2016-08-10 DIAGNOSIS — R5383 Other fatigue: Secondary | ICD-10-CM | POA: Diagnosis not present

## 2016-08-10 DIAGNOSIS — R911 Solitary pulmonary nodule: Secondary | ICD-10-CM | POA: Insufficient documentation

## 2016-08-10 DIAGNOSIS — R221 Localized swelling, mass and lump, neck: Secondary | ICD-10-CM

## 2016-08-10 LAB — NITRIC OXIDE: Nitric Oxide: 10

## 2016-08-10 NOTE — Telephone Encounter (Signed)
Called and spoke to Brittany Robertson. Previous order was placed wrong, new order placed for pulmonary rehab. Brittany Robertson verbalized understanding and denied any further questions or concerns at this time.

## 2016-08-10 NOTE — Addendum Note (Signed)
Addended by: Collier Salina on: 08/10/2016 11:37 AM   Modules accepted: Orders

## 2016-08-10 NOTE — Patient Instructions (Addendum)
SOB (shortness of breath) -  Other fatigue  - refer pulmonary rehab  History of snoring - refer sleep doc in our office  Sensation of lump in throat - can be related to above; refer to Dr Janace Hoard for re-consult  Lung nodule 66mm RML  - very low risk for lung cancer; repeat ct chest wo contrast in 1 year  Followup 6 months or soonre

## 2016-08-10 NOTE — Progress Notes (Addendum)
Subjective:     Patient ID: Brittany Robertson, female   DOB: 1941/02/21, 75 y.o.   MRN: [id number]  HPI  PCP Mayra Neer, MD   HPI   IOV 06/29/2016  Chief Complaint  Patient presents with  . Pulmonary Consult    Pt referred by Dr. Fransico Him for abnormal PFT. Pt c/o DOE with little activity, dry cough and left upper chest pain when SOB. Pt saw cardiology in 04/2016 and did stress test and echo.      Brittany Robertson is a 75 y.o. female who is a resident of [place]. She presents with a husband because of insidious onset of shortness of breath for last 9 months. She tells me that in 2005 she had similar shortness of breath and at that time had coronary stent placed and shortness of breath resolved. Then this time dyspnea has recurred some 6-9 months ago. Since then it is been progressive. It is a moderate intensity. It is associated with a mild dry cough. This also progressive. She went to cardiologist Dr. Radford Pax who did cardiac stress test 05/14/2016 and this was low risk. Patient then had pulmonary function test which showed isolated reduction in diffusion capacity of 14.85/61%. Spirometry and total lung capacity normal. I personally visualized the trace. Chest x-ray done 05/25/2016 that I personally visualized shows possibility of interstitial lung disease. She does have a CT chest in 2012 done during the time of trauma that shows some infiltrates. She and her husband now wants this evaluated. Most recent hemoglobin 04/24/2016 is normal at 13.1 g percent. Creatinine is normal at 0.76 mg percent.    OV 08/10/2016  Chief Complaint  Patient presents with  . Follow-up    Pt here after HRCT and CPST. Pt states she is still SOB, no change. Pt c/o occassional dry cough and upper left chest pain. Pt denies f/c/s and swelling.    Follow-up dyspnea  Here with her husband review high-resolution CT chest documented below and pulmonary stress test done 07/29/2016. Pulmonary stress test  results reviewed. She was wheezing at the outset. She had dyspnea then exercise. She given adequate exercise. Etiology of dyspnea is likely obesity and possible diastolic dysfunction but mostly obesity. This is based on Ve. there was no evidence of exercise-induced spasm. Today we did exhaled nitric oxide and this was normal at 10. In discussing the results husband reported that several years ago she had upper endoscopy for a sensation of frog in the throat and was referred to Dr. Janace Hoard and ENT and something is found in the bulb cards. He also admits to significant snoring of the wife but no nighttime apnea spells. There is a definite fatigue but no excessive daytime somnolence. They're wondering about possible sleep apnea.      IMPRESSION: 1. No evidence of interstitial lung disease. 2. Right middle lobe 4 mm solid pulmonary nodule, new since 09/27/2011. No follow-up needed if patient is low-risk. Non-contrast chest CT can be considered in 12 months if patient is high-risk. This recommendation follows the consensus statement: Guidelines for Management of Incidental Pulmonary Nodules Detected on CT Images: From the Fleischner Society 2017; Radiology 2017; 284:228-243. 3. 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 4. Aortic atherosclerosis. 5. **An incidental finding of potential clinical significance has been found. Ascending thoracic aortic 4.2 cm aneurysm, new since 2012. Recommend annual imaging followup by CTA or MRA. This recommendation follows 2010 ACCF/AHA/AATS/ACR/ASA/SCA/SCAI/SIR/STS/SVM Guidelines for the Diagnosis and Management of Patients with Thoracic Aortic Disease. Circulation.  2010; [id number]. ** 6. Left main and 3 vessel coronary atherosclerosis.   Electronically Signed By: Ilona Sorrel M.D. On: 07/03/2016 10:38       Last Resulted: 07/03/16 10:38           Review of Systems     Objective:   Physical Exam  Vitals:   08/10/16 0935   BP: 136/88  Pulse: 61  SpO2: 96%  Weight: 229 lb (103.9 kg)  Height: 5\' 4"  (1.626 m)    Estimated body mass index is 39.31 kg/m as calculated from the following:   Height as of this encounter: 5\' 4"  (1.626 m).   Weight as of this encounter: 229 lb (103.9 kg).  Discussion only visit    Assessment:       ICD-9-CM ICD-10-CM   1. SOB (shortness of breath) 786.05 R06.02 AMB referral to rehabilitation     Ambulatory referral to Pulmonology     Ambulatory referral to ENT     CANCELED: Nitric oxide  2. Other fatigue 780.79 R53.83 AMB referral to rehabilitation     Ambulatory referral to Pulmonology     Ambulatory referral to ENT  3. History of snoring V15.89 Z87.898 AMB referral to rehabilitation     Ambulatory referral to Pulmonology     Ambulatory referral to ENT  4. Sensation of lump in throat 784.99 R22.1 AMB referral to rehabilitation     Ambulatory referral to Pulmonology     Ambulatory referral to ENT  5. Nodule of right lung 793.11 R91.1 CT Chest Wo Contrast       Plan:     SOB (shortness of breath) -  Other fatigue  - refer pulmonary rehab  History of snoring - refer sleep doc in our office  Sensation of lump in throat - can be related to above; refer to Dr Janace Hoard for re-consult  Lung nodule 69mm RML  - very low risk for lung cancer; repeat ct chest wo contrast in 1 year  Followup 6 months or soonre  > 50% of this > 25 min visit spent in face to face counseling or coordination of care     Dr. Brand Males, M.D., [facility].C.P Pulmonary and Critical Care Medicine Staff Physician [place] [facility] Pager: [phone], If no answer or between  15:00h - 7:00h: call [phone]  08/10/2016 10:31 AM

## 2016-08-18 ENCOUNTER — Ambulatory Visit: Payer: Medicare Other | Admitting: Internal Medicine

## 2016-08-18 DIAGNOSIS — K219 Gastro-esophageal reflux disease without esophagitis: Secondary | ICD-10-CM | POA: Insufficient documentation

## 2016-08-21 ENCOUNTER — Encounter: Payer: Self-pay | Admitting: Cardiology

## 2016-08-24 ENCOUNTER — Encounter (HOSPITAL_COMMUNITY)
Admission: RE | Admit: 2016-08-24 | Discharge: 2016-08-24 | Disposition: A | Discharge status: Home / Self Care | Payer: Medicare Other | Source: Ambulatory Visit | Attending: Internal Medicine | Admitting: Internal Medicine

## 2016-08-24 ENCOUNTER — Telehealth: Payer: Self-pay | Admitting: Cardiology

## 2016-08-24 DIAGNOSIS — R0602 Shortness of breath: Secondary | ICD-10-CM | POA: Insufficient documentation

## 2016-08-24 NOTE — Telephone Encounter (Signed)
Brittany Robertson reports she was told she could not start Pulmonary Rehab until cleared by Cardiology. When she went to start the other day, they asked her questions about her health history and she told them she still has SOB and intermittent CP. She is concerned because Dr. Radford Pax mentioned doing a heart catheterization if symptoms persisted and wants to know if she still needs the procedure. Offered patient to be seen for evaluation today by Estella Husk but patient refused - she only wants to see Dr. Radford Pax. Patient's husband had OV already scheduled this Thursday. He offered his appointment for [name] to take. Confirmed with him he is asymptomatic. Rescheduled [name] to next available and scheduled [name] this Thursday at 815. Both were agreeable to plan.

## 2016-08-24 NOTE — Telephone Encounter (Signed)
New message    Patient came in for orientation and pulmonary rehab today. Patient report multiple episode chest pain  Lasting 30 -45 min .    patient has not taken nitro Remo Lipps instructed patient to take medication if it happens again.    Per Patient husband has appt on  11.9 @ 8 am Rober Oakley he would be happy to let his wife has his slot.    Remo Lipps does not fell comfort starting program due to chest pain.    Please discuss heart cath / contact the patient.    Pt C/O of Chest Pain: STAT if CP now or developed within 24 hours  1. Are you having CP right now? At orientation / pulmonary - Per Remo Lipps verbalized - no   2. Are you experiencing any other symptoms (ex. SOB, nausea, vomiting, sweating)? Per Remo Lipps - sob  3. How long have you been experiencing CP? Per Remo Lipps verbalized- since the first of year   4. Is your CP continuous or coming and going? Per Remo Lipps verbalized - continuous   5. Have you taken Nitroglycerin? Per Remo Lipps verbalized - no  ?

## 2016-08-24 NOTE — Progress Notes (Signed)
Today Brittany Robertson and her husband arrived in pulmonary rehab for orientation today.  The interview process was begun, but not completed due to patient's report of chest pain that has occurred off and on since "the first of the year".  She is also experiencing SOB.  She was NOT having chest pain while in pulmonary rehab. The most recent episode was 2-3 days ago and lasted 30-45 minutes.  She did not take NTG.  I instructed her in the correct way to take NTG and she does carry it with her.  She has a history of a stent in 2005 and her main complaint was SOB.  Patient and husband report that Dr. Radford Pax would consider performing a heart catheterization if Dr. Chase Caller did not find lung disease.  I left a detailed message with Rochel Brome at [facility] with all the above stated information.  At this time I do not feel starting [facility] in pulmonary rehab until the chest pain issue is resolved.  Jaree and husband want to find out why she continues to have chest pain and SOB.   [id number]

## 2016-08-25 ENCOUNTER — Encounter: Payer: Self-pay | Admitting: Cardiology

## 2016-08-25 ENCOUNTER — Telehealth: Payer: Self-pay | Admitting: Cardiology

## 2016-08-25 ENCOUNTER — Other Ambulatory Visit: Payer: Self-pay | Admitting: Cardiology

## 2016-08-25 DIAGNOSIS — R079 Chest pain, unspecified: Secondary | ICD-10-CM

## 2016-08-25 NOTE — Telephone Encounter (Signed)
Patient agrees to cardiac catheterization. Scheduled patient with B. Rosita Fire 11/8 to discuss R and L catheterization already scheduled Monday, 11/13.  Patient understands she will have labs drawn tomorrow.  Instruction letter written to give to patient.  Patient understands she will get letter at office visit. She was grateful for call.

## 2016-08-25 NOTE — Telephone Encounter (Signed)
Patient not able to start pulmonary HTN due to ongoing chest pain and SOB.  She has had an extensive workup from cardiac standpoint and pulmonary standpoint but patient continues to call office with CP and SOB and pulmonary rehab held.  Please set up for right and left heart cath.

## 2016-08-26 ENCOUNTER — Ambulatory Visit (INDEPENDENT_AMBULATORY_CARE_PROVIDER_SITE_OTHER): Payer: Medicare Other | Admitting: Cardiology

## 2016-08-26 ENCOUNTER — Encounter: Payer: Self-pay | Admitting: Cardiology

## 2016-08-26 VITALS — BP 128/80 | HR 66 | Ht 64.0 in | Wt 223.1 lb

## 2016-08-26 DIAGNOSIS — Z79899 Other long term (current) drug therapy: Secondary | ICD-10-CM | POA: Diagnosis not present

## 2016-08-26 DIAGNOSIS — Z01812 Encounter for preprocedural laboratory examination: Secondary | ICD-10-CM | POA: Diagnosis not present

## 2016-08-26 LAB — BASIC METABOLIC PANEL
BUN: 19 mg/dL (ref 7–25)
CHLORIDE: 106 mmol/L (ref 98–110)
CO2: 23 mmol/L (ref 20–31)
Calcium: 9.4 mg/dL (ref 8.6–10.4)
Creat: 0.9 mg/dL (ref 0.60–0.93)
GLUCOSE: 111 mg/dL — AB (ref 65–99)
POTASSIUM: 4.6 mmol/L (ref 3.5–5.3)
Sodium: 140 mmol/L (ref 135–146)

## 2016-08-26 LAB — CBC WITH DIFFERENTIAL/PLATELET
BASOS ABS: 53 {cells}/uL (ref 0–200)
Basophils Relative: 1 %
EOS ABS: 159 {cells}/uL (ref 15–500)
Eosinophils Relative: 3 %
HEMATOCRIT: 40.4 % (ref 35.0–45.0)
HEMOGLOBIN: 13.4 g/dL (ref 11.7–15.5)
LYMPHS ABS: 1325 {cells}/uL (ref 850–3900)
Lymphocytes Relative: 25 %
MCH: 32.2 pg (ref 27.0–33.0)
MCHC: 33.2 g/dL (ref 32.0–36.0)
MCV: 97.1 fL (ref 80.0–100.0)
MONO ABS: 689 {cells}/uL (ref 200–950)
MPV: 11.1 fL (ref 7.5–12.5)
Monocytes Relative: 13 %
NEUTROS ABS: 3074 {cells}/uL (ref 1500–7800)
Neutrophils Relative %: 58 %
Platelets: 231 10*3/uL (ref 140–400)
RBC: 4.16 MIL/uL (ref 3.80–5.10)
RDW: 14 % (ref 11.0–15.0)
WBC: 5.3 10*3/uL (ref 3.8–10.8)

## 2016-08-26 LAB — PROTIME-INR
INR: 1
PROTHROMBIN TIME: 10.8 s (ref 9.0–11.5)

## 2016-08-26 NOTE — Patient Instructions (Signed)
Your physician recommends that you continue on your current medications as directed. Please refer to the Current Medication list given to you today.   Your physician has requested that you have a cardiac catheterization. Cardiac catheterization is used to diagnose and/or treat various heart conditions. Doctors may recommend this procedure for a number of different reasons. The most common reason is to evaluate chest pain. Chest pain can be a symptom of coronary artery disease (CAD), and cardiac catheterization can show whether plaque is narrowing or blocking your heart's arteries. This procedure is also used to evaluate the valves, as well as measure the blood flow and oxygen levels in different parts of your heart. For further information please visit [email or web address]. Please follow instruction sheet, as given.   Your physician recommends that you return for lab work in:  TODAY  BMET  CBC  AND  INR

## 2016-08-26 NOTE — Progress Notes (Signed)
08/26/2016 Brittany Robertson   May 24, 1941  [id number]  Primary Physician Mayra Neer, MD Primary Cardiologist: Dr. Radford Pax    Reason for Visit/CC: Unstable Angina   HPI:  Brittany Robertson is a 75 y/o female, followed by Dr. Radford Pax, who presents to clinic today to discuss R/LHC. Patient has been ordered to undergo [facility] as part of w/u for chest pain and dyspnea. She has a known h/o CAD s/p PCI + stent to the mid LAD in 2005. Her last cath in 2014 showed patent stent and otherwise normal coronary arteries with diastolic dysfunction.   Patient has developed recurrent CP and dyspnea. Symptoms occur at rest and with exertion and are self limitting. Dr. Radford Pax recently ordered a NST which was normal. 2D echo and BNP also normal. She was referred to Pulmonary and CT scan showed no evidence of ILD.  There was also noted to be mild dilated ascending aortic aneurysm at 4.2cm (increased from 3.8cm 2012). There was evidence as well of Left main with 3 vessel CAD noted on chest CT. A CPST with bike was ordered which was unremarkable. Given ongoing symptoms, she has been referred for [facility], which is scheduled with Dr. Burt Knack on 08/31/16.  She presents to clinic today with her husband to discuss indication for cath and procedural details including potential risk. She is currently asymptomatic. She has SL NTG at home and understands when to call 911. Her BP and HR are both well controlled in clinic today.   Current Meds  Medication Sig  . aspirin 81 MG tablet Take 81 mg by mouth daily.  Marland Kitchen atorvastatin (LIPITOR) 20 MG tablet Take 20 mg by mouth at bedtime.  . Calcium Carbonate-Vitamin D (CALCIUM + D PO) Take 1 tablet by mouth 2 (two) times daily. Sun, Tues, Thurs, Sat  . cetirizine (ZYRTEC) 10 MG tablet Take 10 mg by mouth every morning.    . cholecalciferol (VITAMIN D) 400 UNITS TABS Take 400 Units by mouth 2 (two) times daily.   . Coenzyme Q10 Liposomal 100 MG/ML LIQD Take 100 mg by mouth every evening.  .  denosumab (PROLIA) 60 MG/ML SOLN injection Inject 60 mg into the skin every 6 (six) months. Administer in upper arm, thigh, or abdomen  . docusate sodium (COLACE) 100 MG capsule Take 100 mg by mouth 2 (two) times daily.   Marland Kitchen esomeprazole (NEXIUM) 40 MG capsule Take 40 mg by mouth daily before breakfast.    . ezetimibe (ZETIA) 10 MG tablet Take 1 tablet (10 mg total) by mouth every evening.  . fish oil-omega-3 fatty acids 1000 MG capsule Take 1 g by mouth 2 (two) times daily.    . furosemide (LASIX) 20 MG tablet Take 20 mg by mouth daily as needed for fluid or edema.   . meloxicam (MOBIC) 15 MG tablet Take 15 mg by mouth every evening.    . methocarbamol (ROBAXIN) 500 MG tablet Take 500 mg by mouth 4 (four) times daily as needed for muscle spasms.  . metoprolol succinate (TOPROL-XL) 25 MG 24 hr tablet Take 1 tablet by mouth  daily  . mirabegron ER (MYRBETRIQ) 25 MG TB24 tablet Take 25 mg by mouth every evening.  . Nasal Dilators (BREATHE RIGHT SMALL/MEDIUM) STRP 1 each by Does not apply route at bedtime as needed (congestion).  . nitroGLYCERIN (NITROSTAT) 0.4 MG SL tablet Place 1 tablet (0.4 mg total) under the tongue every 5 (five) minutes as needed. As needed for chest pain x 3 doses  . olmesartan (BENICAR)  40 MG tablet Take 1 tablet (40 mg total) by mouth daily.  Vladimir Faster Glycol-Propyl Glycol (SYSTANE ULTRA OP) Apply 1 drop to eye 2 (two) times daily as needed (dry eyes).  . potassium chloride SA (K-DUR,KLOR-CON) 20 MEQ tablet Take 1 tablet by mouth  every morning  . vitamin C (ASCORBIC ACID) 500 MG tablet Take 500 mg by mouth at bedtime.   . vitamin E 400 UNIT capsule Take 400 Units by mouth at bedtime.    Allergies  Allergen Reactions  . Atorvastatin Other (See Comments)    Other reaction(s): Other (See Comments) GENERIC causes severe skin rash but can take Brand Name GENERIC causes severe skin rash but can take Brand Name  . Codeine Nausea And Vomiting    Other reaction(s): Vomiting  (intolerance)  . Sulfa Antibiotics Hives and Itching  . Sulfamethoxazole Hives  . Sulfasalazine Itching and Hives  . Adhesive [Tape] Itching, Rash and Other (See Comments)    Other reaction(s): Other (See Comments) Skin irritation Skin irritation  . Other Rash    Surgical tape Surgical tape Surgical tape  . Penicillins Itching, Rash and Hives   Past Medical History:  Diagnosis Date  . Bruises easily   . Chronic diastolic CHF (congestive heart failure) ([place])   . Complication of anesthesia    "takes a long time towake up"  . Coronary artery disease    PCI 2006, repeat cath 30% mid LAD distal to stent otherwise normal coronary arteries, cath 08/2013 with patent LAD stent and otherwise normal coronary arteries with diastolic dysfunction  . DDD (degenerative disc disease), cervical   . Degenerative disk disease   . DOE (dyspnea on exertion)    chronic secondary to obesity, deconditiong and chronic diastolic CHF  . GERD (gastroesophageal reflux disease)   . Hyperlipidemia   . Hypertension   . OA (osteoarthritis)   . Obesity   . Osteopenia   . PONV (postoperative nausea and vomiting)    Motion sickness when move out of PACU to room  . PVC's (premature ventricular contractions)   . SOB (shortness of breath) 07/13/2016   Family History  Problem Relation Age of Onset  . Hypertension Mother   . Cancer Mother   . Hypertension Father   . Heart Problems Father    Past Surgical History:  Procedure Laterality Date  . ABDOMINAL HYSTERECTOMY    . APPENDECTOMY  1957  . arthroscopic knee  05/2002,04/07/2005   right and then also torn muscle surgery on right knee (2003(  . arthroscopic knee  01/02/2005,09/21/2006   left  . CHOLECYSTECTOMY  1988  . COLONOSCOPY  2010   with Endo  . CORONARY ANGIOPLASTY  02/05/2004  . CYST REMOVAL HAND  2000  . [place] OF UTERUS  1972  . EYE SURGERY  01/2007   bilateral cataract with lens implant  . FOOT SURGERY  01/2012   left and  right bunion and hammer toe  . HAND SURGERY  05/29/2011   joint removed  . [place]  . HERNIA REPAIR  02/2010   incisional hernioa  . KNEE ARTHROSCOPY    . LEFT HEART CATHETERIZATION WITH CORONARY ANGIOGRAM N/A 09/19/2013   Procedure: LEFT HEART CATHETERIZATION WITH CORONARY ANGIOGRAM;  Surgeon: Sueanne Margarita, MD;  Location: [facility]ATH [facility];  Service: Cardiovascular;  Laterality: N/A;  . neck injections  03/2014   back and neck injections from Dr. Nelva Bush  . stents     x1  . tear duct  surgery  1990's   bilateral  . TONSILLECTOMY     as child  . TOTAL KNEE ARTHROPLASTY Left 01/04/2015   Procedure: LEFT TOTAL KNEE ARTHROPLASTY;  Surgeon: Sydnee Cabal, MD;  Location: [facility];  Service: Orthopedics;  Laterality: Left;  . TOTAL KNEE ARTHROPLASTY Right 05/10/2015   Procedure: TOTAL RIGHT KNEE ARTHROPLASTY;  Surgeon: Sydnee Cabal, MD;  Location: [facility];  Service: Orthopedics;  Laterality: Right;   Social History   Social History  . Marital status: Married    Spouse name: N/A  . Number of children: N/A  . Years of education: N/A   Occupational History  . Not on file.   Social History Main Topics  . Smoking status: Never Smoker  . Smokeless tobacco: Never Used  . Alcohol use 0.6 oz/week    1 Glasses of wine per week     Comment: occassionally  . Drug use: No  . Sexual activity: Not on file   Other Topics Concern  . Not on file   Social History Narrative  . No narrative on file     Review of Systems: General: negative for chills, fever, night sweats or weight changes.  Cardiovascular: negative for chest pain, dyspnea on exertion, edema, orthopnea, palpitations, paroxysmal nocturnal dyspnea or shortness of breath Dermatological: negative for rash Respiratory: negative for cough or wheezing Urologic: negative for hematuria Abdominal: negative for nausea, vomiting, diarrhea, bright red blood per rectum, melena, or hematemesis Neurologic: negative for visual  changes, syncope, or dizziness All other systems reviewed and are otherwise negative except as noted above.   Physical Exam:  Blood pressure 128/80, pulse 66, height 5\' 4"  (1.626 m), weight 223 lb 1.9 oz (101.2 kg), SpO2 98 %.  General appearance: alert, cooperative and no distress Neck: no carotid bruit and no JVD Lungs: clear to auscultation bilaterally Heart: regular rate and rhythm, S1, S2 normal, no murmur, click, rub or gallop Extremities: extremities normal, atraumatic, no cyanosis or edema Pulses: 2+ and symmetric Skin: Skin color, texture, turgor normal. No rashes or lesions Neurologic: Grossly normal  EKG not performed  ASSESSMENT AND PLAN:   1. Unstable Angina + Dyspnea: currently asymptomatic in clinic today. VSS. Will plan for [facility] with Dr. Burt Knack on 08/31/16. Patient has SL NTG at home and understands to seek emergency medical attention if unstable angina unrelieved with SL NTG. We discussed indication for cath and procedural details including potential risk including death, MI, stroke, vascular injury, loss of limb, renal injury, bleeding complications and allergic reaction. She understands theses risk and agrees to proceed.    2. HTN:  Well controlled on current regimen.   3. HLD: on statin therapy with Lipitor  4. Dilated Aortic Root: 4.3cm by chest CT.  Will repeat with echo in 1 year  PLAN  F/u post cath.   Lyda Jester PA-C 08/26/2016 8:28 AM

## 2016-08-27 ENCOUNTER — Ambulatory Visit: Payer: Medicare Other | Admitting: Cardiology

## 2016-08-31 ENCOUNTER — Encounter (HOSPITAL_COMMUNITY): Payer: Self-pay | Admitting: Cardiovascular Disease

## 2016-08-31 ENCOUNTER — Encounter (HOSPITAL_COMMUNITY)
Admission: RE | Disposition: A | Discharge status: Home / Self Care | Payer: Self-pay | Source: Ambulatory Visit | Attending: Cardiovascular Disease

## 2016-08-31 ENCOUNTER — Ambulatory Visit (HOSPITAL_COMMUNITY)
Admission: RE | Admit: 2016-08-31 | Discharge: 2016-08-31 | Disposition: A | Discharge status: Home / Self Care | Payer: Medicare Other | Source: Ambulatory Visit | Attending: Cardiovascular Disease | Admitting: Cardiovascular Disease

## 2016-08-31 DIAGNOSIS — Z7982 Long term (current) use of aspirin: Secondary | ICD-10-CM | POA: Diagnosis not present

## 2016-08-31 DIAGNOSIS — K219 Gastro-esophageal reflux disease without esophagitis: Secondary | ICD-10-CM | POA: Diagnosis not present

## 2016-08-31 DIAGNOSIS — Z96653 Presence of artificial knee joint, bilateral: Secondary | ICD-10-CM | POA: Diagnosis not present

## 2016-08-31 DIAGNOSIS — I110 Hypertensive heart disease with heart failure: Secondary | ICD-10-CM | POA: Insufficient documentation

## 2016-08-31 DIAGNOSIS — R0602 Shortness of breath: Secondary | ICD-10-CM | POA: Diagnosis not present

## 2016-08-31 DIAGNOSIS — Z955 Presence of coronary angioplasty implant and graft: Secondary | ICD-10-CM | POA: Diagnosis not present

## 2016-08-31 DIAGNOSIS — Z6838 Body mass index (BMI) 38.0-38.9, adult: Secondary | ICD-10-CM | POA: Diagnosis not present

## 2016-08-31 DIAGNOSIS — Z8249 Family history of ischemic heart disease and other diseases of the circulatory system: Secondary | ICD-10-CM | POA: Diagnosis not present

## 2016-08-31 DIAGNOSIS — I5032 Chronic diastolic (congestive) heart failure: Secondary | ICD-10-CM | POA: Insufficient documentation

## 2016-08-31 DIAGNOSIS — R0689 Other abnormalities of breathing: Secondary | ICD-10-CM

## 2016-08-31 DIAGNOSIS — E669 Obesity, unspecified: Secondary | ICD-10-CM | POA: Diagnosis not present

## 2016-08-31 DIAGNOSIS — E785 Hyperlipidemia, unspecified: Secondary | ICD-10-CM | POA: Diagnosis not present

## 2016-08-31 DIAGNOSIS — R079 Chest pain, unspecified: Secondary | ICD-10-CM | POA: Diagnosis not present

## 2016-08-31 DIAGNOSIS — I2510 Atherosclerotic heart disease of native coronary artery without angina pectoris: Secondary | ICD-10-CM | POA: Insufficient documentation

## 2016-08-31 DIAGNOSIS — Z79899 Other long term (current) drug therapy: Secondary | ICD-10-CM | POA: Diagnosis not present

## 2016-08-31 DIAGNOSIS — R0600 Dyspnea, unspecified: Secondary | ICD-10-CM | POA: Diagnosis not present

## 2016-08-31 HISTORY — PX: CARDIAC CATHETERIZATION: SHX172

## 2016-08-31 LAB — POCT I-STAT 3, ART BLOOD GAS (G3+)
Acid-base deficit: 1 mmol/L (ref 0.0–2.0)
Bicarbonate: 22.9 mmol/L (ref 20.0–28.0)
O2 Saturation: 93 %
PCO2 ART: 36 mmHg (ref 32.0–48.0)
PH ART: 7.412 (ref 7.350–7.450)
TCO2: 24 mmol/L (ref 0–100)
pO2, Arterial: 64 mmHg — ABNORMAL LOW (ref 83.0–108.0)

## 2016-08-31 LAB — POCT I-STAT 3, VENOUS BLOOD GAS (G3P V)
Acid-base deficit: 1 mmol/L (ref 0.0–2.0)
Acid-base deficit: 3 mmol/L — ABNORMAL HIGH (ref 0.0–2.0)
BICARBONATE: 23.3 mmol/L (ref 20.0–28.0)
Bicarbonate: 22.2 mmol/L (ref 20.0–28.0)
O2 SAT: 65 %
O2 Saturation: 68 %
PCO2 VEN: 37.2 mmHg — AB (ref 44.0–60.0)
PCO2 VEN: 37.3 mmHg — AB (ref 44.0–60.0)
PH VEN: 7.382 (ref 7.250–7.430)
PH VEN: 7.405 (ref 7.250–7.430)
PO2 VEN: 34 mmHg (ref 32.0–45.0)
PO2 VEN: 35 mmHg (ref 32.0–45.0)
TCO2: 23 mmol/L (ref 0–100)
TCO2: 24 mmol/L (ref 0–100)

## 2016-08-31 SURGERY — RIGHT/LEFT HEART CATH AND CORONARY ANGIOGRAPHY
Anesthesia: LOCAL

## 2016-08-31 MED ORDER — MIDAZOLAM HCL 2 MG/2ML IJ SOLN
INTRAMUSCULAR | Status: AC
  Filled 2016-08-31: qty 2

## 2016-08-31 MED ORDER — HEPARIN SODIUM (PORCINE) 1000 UNIT/ML IJ SOLN
INTRAMUSCULAR | Status: DC | PRN
  Administered 2016-08-31: 1000 [IU] via INTRAVENOUS

## 2016-08-31 MED ORDER — ONDANSETRON HCL 4 MG/2ML IJ SOLN: 4.0000 mg | Freq: Four times a day (QID) | INTRAMUSCULAR | Status: DC | PRN

## 2016-08-31 MED ORDER — SODIUM CHLORIDE 0.9% FLUSH: 3.0000 mL | Freq: Two times a day (BID) | INTRAVENOUS | Status: DC

## 2016-08-31 MED ORDER — MIDAZOLAM HCL 2 MG/2ML IJ SOLN
INTRAMUSCULAR | Status: DC | PRN
  Administered 2016-08-31: 2 mg via INTRAVENOUS

## 2016-08-31 MED ORDER — SODIUM CHLORIDE 0.9 % IV SOLN: 250.0000 mL | INTRAVENOUS | Status: DC | PRN

## 2016-08-31 MED ORDER — SODIUM CHLORIDE 0.9% FLUSH: 3.0000 mL | INTRAVENOUS | Status: DC | PRN

## 2016-08-31 MED ORDER — LIDOCAINE HCL (PF) 1 % IJ SOLN
INTRAMUSCULAR | Status: DC | PRN
  Administered 2016-08-31: 2 mL via INTRADERMAL
  Administered 2016-08-31: 20 mL via INTRADERMAL

## 2016-08-31 MED ORDER — FENTANYL CITRATE (PF) 100 MCG/2ML IJ SOLN
INTRAMUSCULAR | Status: AC
  Filled 2016-08-31: qty 2

## 2016-08-31 MED ORDER — SODIUM CHLORIDE 0.9 % IV SOLN: INTRAVENOUS | Status: AC

## 2016-08-31 MED ORDER — SODIUM CHLORIDE 0.9 % WEIGHT BASED INFUSION: 1.0000 mL/kg/h | INTRAVENOUS | Status: DC

## 2016-08-31 MED ORDER — ASPIRIN 81 MG PO CHEW: 81.0000 mg | CHEWABLE_TABLET | ORAL | Status: DC

## 2016-08-31 MED ORDER — IOPAMIDOL (ISOVUE-370) INJECTION 76%
INTRAVENOUS | Status: AC
  Filled 2016-08-31: qty 100

## 2016-08-31 MED ORDER — HEPARIN (PORCINE) IN NACL 2-0.9 UNIT/ML-% IJ SOLN
INTRAMUSCULAR | Status: AC
  Filled 2016-08-31: qty 1000

## 2016-08-31 MED ORDER — ACETAMINOPHEN 325 MG PO TABS: 650.0000 mg | ORAL_TABLET | ORAL | Status: DC | PRN

## 2016-08-31 MED ORDER — IOPAMIDOL (ISOVUE-370) INJECTION 76%
INTRAVENOUS | Status: DC | PRN
  Administered 2016-08-31: 45 mL via INTRA_ARTERIAL

## 2016-08-31 MED ORDER — FENTANYL CITRATE (PF) 100 MCG/2ML IJ SOLN
INTRAMUSCULAR | Status: DC | PRN
  Administered 2016-08-31 (×2): 25 ug via INTRAVENOUS

## 2016-08-31 MED ORDER — LIDOCAINE HCL (PF) 1 % IJ SOLN
INTRAMUSCULAR | Status: AC
  Filled 2016-08-31: qty 30

## 2016-08-31 MED ORDER — VERAPAMIL HCL 2.5 MG/ML IV SOLN
INTRAVENOUS | Status: AC
  Filled 2016-08-31: qty 2

## 2016-08-31 MED ORDER — SODIUM CHLORIDE 0.9 % WEIGHT BASED INFUSION
3.0000 mL/kg/h | INTRAVENOUS | Status: AC
  Administered 2016-08-31: 3 mL/kg/h via INTRAVENOUS

## 2016-08-31 SURGICAL SUPPLY — 14 items
CATH INFINITI 5FR MULTPACK ANG (CATHETERS) ×1 IMPLANT
CATH SWAN GANZ 7F STRAIGHT (CATHETERS) ×1 IMPLANT
GLIDESHEATH SLEND SS 6F .021 (SHEATH) ×1 IMPLANT
GUIDEWIRE 3MM J TIP .035 145 (WIRE) ×1 IMPLANT
GUIDEWIRE INQWIRE 1.5J.035X260 (WIRE) IMPLANT
INQWIRE 1.5J .035X260CM (WIRE) ×2
KIT HEART LEFT (KITS) ×2 IMPLANT
PACK CARDIAC CATHETERIZATION (CUSTOM PROCEDURE TRAY) ×2 IMPLANT
SHEATH FAST CATH BRACH 5F 5CM (SHEATH) ×1 IMPLANT
SHEATH PINNACLE 5F 10CM (SHEATH) ×1 IMPLANT
SHEATH PINNACLE 7F 10CM (SHEATH) ×1 IMPLANT
SYR MEDRAD MARK V 150ML (SYRINGE) ×2 IMPLANT
TRANSDUCER W/STOPCOCK (MISCELLANEOUS) ×3 IMPLANT
TUBING CIL FLEX 10 FLL-RA (TUBING) ×2 IMPLANT

## 2016-08-31 NOTE — Progress Notes (Signed)
Site area: Right groin a 5 french arterial and a 7 french venous sheath was removed  Site Prior to Removal:  Level 0  Pressure Applied For 15 MINUTES    Bedrest Beginning at 0915am  Manual:   Yes.    Patient Status During Pull:  stable  Post Pull Groin Site:  Level 0  Post Pull Instructions Given:  Yes.    Post Pull Pulses Present:  Yes.    Dressing Applied:  Yes.    Comments:  VS remain stable during sheath pull

## 2016-08-31 NOTE — Progress Notes (Signed)
Please refer to pulmonary for ongoing non cardiac SOB

## 2016-08-31 NOTE — Discharge Instructions (Signed)
Angiogram, Care After °Refer to this sheet in the next few weeks. These instructions provide you with information about caring for yourself after your procedure. Your health care provider may also give you more specific instructions. Your treatment has been planned according to current medical practices, but problems sometimes occur. Call your health care provider if you have any problems or questions after your procedure. °WHAT TO EXPECT AFTER THE PROCEDURE °After your procedure, it is typical to have the following: °· Bruising at the catheter insertion site that usually fades within 1-2 weeks. °· Blood collecting in the tissue (hematoma) that may be painful to the touch. It should usually decrease in size and tenderness within 1-2 weeks. °HOME CARE INSTRUCTIONS °· Take medicines only as directed by your health care provider. °· You may shower 24-48 hours after the procedure or as directed by your health care provider. Remove the bandage (dressing) and gently wash the site with plain soap and water. Pat the area dry with a clean towel. Do not rub the site, because this may cause bleeding. °· Do not take baths, swim, or use a hot tub until your health care provider approves. °· Check your insertion site every day for redness, swelling, or drainage. °· Do not apply powder or lotion to the site. °· Do not lift over 10 lb (4.5 kg) for 5 days after your procedure or as directed by your health care provider. °· Ask your health care provider when it is okay to: °¨ Return to work or school. °¨ Resume usual physical activities or sports. °¨ Resume sexual activity. °· Do not drive home if you are discharged the same day as the procedure. Have someone else drive you. °· You may drive 24 hours after the procedure unless otherwise instructed by your health care provider. °· Do not operate machinery or power tools for 24 hours after the procedure or as directed by your health care provider. °· If your procedure was done as an  outpatient procedure, which means that you went home the same day as your procedure, a responsible adult should be with you for the first 24 hours after you arrive home. °· Keep all follow-up visits as directed by your health care provider. This is important. °SEEK MEDICAL CARE IF: °· You have a fever. °· You have chills. °· You have increased bleeding from the catheter insertion site. Hold pressure on the site. °SEEK IMMEDIATE MEDICAL CARE IF: °· You have unusual pain at the catheter insertion site. °· You have redness, warmth, or swelling at the catheter insertion site. °· You have drainage (other than a small amount of blood on the dressing) from the catheter insertion site. °· The catheter insertion site is bleeding, and the bleeding does not stop after 30 minutes of holding steady pressure on the site. °· The area near or just beyond the catheter insertion site becomes pale, cool, tingly, or numb. °  °This information is not intended to replace advice given to you by your health care provider. Make sure you discuss any questions you have with your health care provider. °  °Document Released: 04/23/2005 Document Revised: 10/26/2014 Document Reviewed: 03/08/2013 °Elsevier Interactive Patient Education ©2016 [employer] ° °

## 2016-08-31 NOTE — Progress Notes (Signed)
Patient left arm had infiltrated IV fluid of normal saline of about 150 cc esimated and Dr. Burt Knack notified at the same time IV was removed due to infiltration as noted.  Dr. Burt Knack came to see patient and expressed no concerned but expressed that the patient is to keep arm elevated for a day or so until the swelling goes down.  Patient currently resting with no complaints.

## 2016-08-31 NOTE — Interval H&P Note (Signed)
Cath Lab Visit (complete for each Cath Lab visit)  Clinical Evaluation Leading to the Procedure:   ACS: No.  Non-ACS:    Anginal Classification: CCS III  Anti-ischemic medical therapy: Minimal Therapy (1 class of medications)  Non-Invasive Test Results: No non-invasive testing performed  Prior CABG: No previous CABG      History and Physical Interval Note:  08/31/2016 8:04 AM  Brittany Robertson  has presented today for surgery, with the diagnosis of cp, sob  The various methods of treatment have been discussed with the patient and family. After consideration of risks, benefits and other options for treatment, the patient has consented to  Procedure(s): Right/Left Heart Cath and Coronary Angiography (N/A) as a surgical intervention .  The patient's history has been reviewed, patient examined, no change in status, stable for surgery.  I have reviewed the patient's chart and labs.  Questions were answered to the patient's satisfaction.     Sherren Mocha

## 2016-08-31 NOTE — H&P (View-Only) (Signed)
08/26/2016 Brittany Robertson   01-12-1941  [id number]  Primary Physician Mayra Neer, MD Primary Cardiologist: Dr. Radford Pax    Reason for Visit/CC: Unstable Angina   HPI:  Brittany Robertson is a 75 y/o female, followed by Dr. Radford Pax, who presents to clinic today to discuss R/LHC. Patient has been ordered to undergo [facility] as part of w/u for chest pain and dyspnea. She has a known h/o CAD s/p PCI + stent to the mid LAD in 2005. Her last cath in 2014 showed patent stent and otherwise normal coronary arteries with diastolic dysfunction.   Patient has developed recurrent CP and dyspnea. Symptoms occur at rest and with exertion and are self limitting. Dr. Radford Pax recently ordered a NST which was normal. 2D echo and BNP also normal. She was referred to Pulmonary and CT scan showed no evidence of ILD.  There was also noted to be mild dilated ascending aortic aneurysm at 4.2cm (increased from 3.8cm 2012). There was evidence as well of Left main with 3 vessel CAD noted on chest CT. A CPST with bike was ordered which was unremarkable. Given ongoing symptoms, she has been referred for [facility], which is scheduled with Dr. Burt Knack on 08/31/16.  She presents to clinic today with her husband to discuss indication for cath and procedural details including potential risk. She is currently asymptomatic. She has SL NTG at home and understands when to call 911. Her BP and HR are both well controlled in clinic today.   Current Meds  Medication Sig  . aspirin 81 MG tablet Take 81 mg by mouth daily.  Marland Kitchen atorvastatin (LIPITOR) 20 MG tablet Take 20 mg by mouth at bedtime.  . Calcium Carbonate-Vitamin D (CALCIUM + D PO) Take 1 tablet by mouth 2 (two) times daily. Sun, Tues, Thurs, Sat  . cetirizine (ZYRTEC) 10 MG tablet Take 10 mg by mouth every morning.    . cholecalciferol (VITAMIN D) 400 UNITS TABS Take 400 Units by mouth 2 (two) times daily.   . Coenzyme Q10 Liposomal 100 MG/ML LIQD Take 100 mg by mouth every evening.  .  denosumab (PROLIA) 60 MG/ML SOLN injection Inject 60 mg into the skin every 6 (six) months. Administer in upper arm, thigh, or abdomen  . docusate sodium (COLACE) 100 MG capsule Take 100 mg by mouth 2 (two) times daily.   Marland Kitchen esomeprazole (NEXIUM) 40 MG capsule Take 40 mg by mouth daily before breakfast.    . ezetimibe (ZETIA) 10 MG tablet Take 1 tablet (10 mg total) by mouth every evening.  . fish oil-omega-3 fatty acids 1000 MG capsule Take 1 g by mouth 2 (two) times daily.    . furosemide (LASIX) 20 MG tablet Take 20 mg by mouth daily as needed for fluid or edema.   . meloxicam (MOBIC) 15 MG tablet Take 15 mg by mouth every evening.    . methocarbamol (ROBAXIN) 500 MG tablet Take 500 mg by mouth 4 (four) times daily as needed for muscle spasms.  . metoprolol succinate (TOPROL-XL) 25 MG 24 hr tablet Take 1 tablet by mouth  daily  . mirabegron ER (MYRBETRIQ) 25 MG TB24 tablet Take 25 mg by mouth every evening.  . Nasal Dilators (BREATHE RIGHT SMALL/MEDIUM) STRP 1 each by Does not apply route at bedtime as needed (congestion).  . nitroGLYCERIN (NITROSTAT) 0.4 MG SL tablet Place 1 tablet (0.4 mg total) under the tongue every 5 (five) minutes as needed. As needed for chest pain x 3 doses  . olmesartan (BENICAR)  40 MG tablet Take 1 tablet (40 mg total) by mouth daily.  Vladimir Faster Glycol-Propyl Glycol (SYSTANE ULTRA OP) Apply 1 drop to eye 2 (two) times daily as needed (dry eyes).  . potassium chloride SA (K-DUR,KLOR-CON) 20 MEQ tablet Take 1 tablet by mouth  every morning  . vitamin C (ASCORBIC ACID) 500 MG tablet Take 500 mg by mouth at bedtime.   . vitamin E 400 UNIT capsule Take 400 Units by mouth at bedtime.    Allergies  Allergen Reactions  . Atorvastatin Other (See Comments)    Other reaction(s): Other (See Comments) GENERIC causes severe skin rash but can take Brand Name GENERIC causes severe skin rash but can take Brand Name  . Codeine Nausea And Vomiting    Other reaction(s): Vomiting  (intolerance)  . Sulfa Antibiotics Hives and Itching  . Sulfamethoxazole Hives  . Sulfasalazine Itching and Hives  . Adhesive [Tape] Itching, Rash and Other (See Comments)    Other reaction(s): Other (See Comments) Skin irritation Skin irritation  . Other Rash    Surgical tape Surgical tape Surgical tape  . Penicillins Itching, Rash and Hives   Past Medical History:  Diagnosis Date  . Bruises easily   . Chronic diastolic CHF (congestive heart failure) ([place])   . Complication of anesthesia    "takes a long time towake up"  . Coronary artery disease    PCI 2006, repeat cath 30% mid LAD distal to stent otherwise normal coronary arteries, cath 08/2013 with patent LAD stent and otherwise normal coronary arteries with diastolic dysfunction  . DDD (degenerative disc disease), cervical   . Degenerative disk disease   . DOE (dyspnea on exertion)    chronic secondary to obesity, deconditiong and chronic diastolic CHF  . GERD (gastroesophageal reflux disease)   . Hyperlipidemia   . Hypertension   . OA (osteoarthritis)   . Obesity   . Osteopenia   . PONV (postoperative nausea and vomiting)    Motion sickness when move out of PACU to room  . PVC's (premature ventricular contractions)   . SOB (shortness of breath) 07/13/2016   Family History  Problem Relation Age of Onset  . Hypertension Mother   . Cancer Mother   . Hypertension Father   . Heart Problems Father    Past Surgical History:  Procedure Laterality Date  . ABDOMINAL HYSTERECTOMY    . APPENDECTOMY  1957  . arthroscopic knee  05/2002,04/07/2005   right and then also torn muscle surgery on right knee (2003(  . arthroscopic knee  01/02/2005,09/21/2006   left  . CHOLECYSTECTOMY  1988  . COLONOSCOPY  2010   with Endo  . CORONARY ANGIOPLASTY  02/05/2004  . CYST REMOVAL HAND  2000  . [place] OF UTERUS  1972  . EYE SURGERY  01/2007   bilateral cataract with lens implant  . FOOT SURGERY  01/2012   left and  right bunion and hammer toe  . HAND SURGERY  05/29/2011   joint removed  . [place]  . HERNIA REPAIR  02/2010   incisional hernioa  . KNEE ARTHROSCOPY    . LEFT HEART CATHETERIZATION WITH CORONARY ANGIOGRAM N/A 09/19/2013   Procedure: LEFT HEART CATHETERIZATION WITH CORONARY ANGIOGRAM;  Surgeon: Sueanne Margarita, MD;  Location: [facility];  Service: Cardiovascular;  Laterality: N/A;  . neck injections  03/2014   back and neck injections from Dr. Nelva Bush  . stents     x1  . tear duct  surgery  1990's   bilateral  . TONSILLECTOMY     as child  . TOTAL KNEE ARTHROPLASTY Left 01/04/2015   Procedure: LEFT TOTAL KNEE ARTHROPLASTY;  Surgeon: Sydnee Cabal, MD;  Location: [facility];  Service: Orthopedics;  Laterality: Left;  . TOTAL KNEE ARTHROPLASTY Right 05/10/2015   Procedure: TOTAL RIGHT KNEE ARTHROPLASTY;  Surgeon: Sydnee Cabal, MD;  Location: [facility];  Service: Orthopedics;  Laterality: Right;   Social History   Social History  . Marital status: Married    Spouse name: N/A  . Number of children: N/A  . Years of education: N/A   Occupational History  . Not on file.   Social History Main Topics  . Smoking status: Never Smoker  . Smokeless tobacco: Never Used  . Alcohol use 0.6 oz/week    1 Glasses of wine per week     Comment: occassionally  . Drug use: No  . Sexual activity: Not on file   Other Topics Concern  . Not on file   Social History Narrative  . No narrative on file     Review of Systems: General: negative for chills, fever, night sweats or weight changes.  Cardiovascular: negative for chest pain, dyspnea on exertion, edema, orthopnea, palpitations, paroxysmal nocturnal dyspnea or shortness of breath Dermatological: negative for rash Respiratory: negative for cough or wheezing Urologic: negative for hematuria Abdominal: negative for nausea, vomiting, diarrhea, bright red blood per rectum, melena, or hematemesis Neurologic: negative for visual  changes, syncope, or dizziness All other systems reviewed and are otherwise negative except as noted above.   Physical Exam:  Blood pressure 128/80, pulse 66, height 5\' 4"  (1.626 m), weight 223 lb 1.9 oz (101.2 kg), SpO2 98 %.  General appearance: alert, cooperative and no distress Neck: no carotid bruit and no JVD Lungs: clear to auscultation bilaterally Heart: regular rate and rhythm, S1, S2 normal, no murmur, click, rub or gallop Extremities: extremities normal, atraumatic, no cyanosis or edema Pulses: 2+ and symmetric Skin: Skin color, texture, turgor normal. No rashes or lesions Neurologic: Grossly normal  EKG not performed  ASSESSMENT AND PLAN:   1. Unstable Angina + Dyspnea: currently asymptomatic in clinic today. VSS. Will plan for [facility] with Dr. Burt Knack on 08/31/16. Patient has SL NTG at home and understands to seek emergency medical attention if unstable angina unrelieved with SL NTG. We discussed indication for cath and procedural details including potential risk including death, MI, stroke, vascular injury, loss of limb, renal injury, bleeding complications and allergic reaction. She understands theses risk and agrees to proceed.    2. HTN:  Well controlled on current regimen.   3. HLD: on statin therapy with Lipitor  4. Dilated Aortic Root: 4.3cm by chest CT.  Will repeat with echo in 1 year  PLAN  F/u post cath.   Lyda Jester PA-C 08/26/2016 8:28 AM

## 2016-09-01 ENCOUNTER — Telehealth (HOSPITAL_COMMUNITY): Payer: Self-pay | Admitting: *Deleted

## 2016-09-02 ENCOUNTER — Telehealth: Payer: Self-pay

## 2016-09-02 NOTE — Telephone Encounter (Signed)
-----   Message from Sueanne Margarita, MD sent at 08/31/2016  2:17 PM EST ----- Just send her back to PCP for weight loss program and to pulmonary to see if she would benefit from pulmonary rehab  Traci ----- Message ----- From: Theodoro Parma, RN Sent: 08/31/2016   2:13 PM To: Sueanne Margarita, MD  Was this FYI or do you have any instructions you need me to review with the patient?  Thanks! ----- Message ----- From: Sueanne Margarita, MD Sent: 08/31/2016   1:59 PM To: Theodoro Parma, RN    ----- Message ----- From: Sherren Mocha, MD Sent: 08/31/2016   8:51 AM To: Sueanne Margarita, MD, Consuelo Pandy, PA-C  Traci and [place] - see attached. R and L cath normal. thx Ronalee Belts

## 2016-09-02 NOTE — Telephone Encounter (Signed)
Informed patient of Dr. Theodosia Blender recommendation to see PCP for weight loss program and Pulmonary for possible Pulmonary Rehab.  She states she already has appointment for Rehab. Patient was grateful for call.

## 2016-09-03 DIAGNOSIS — R0602 Shortness of breath: Secondary | ICD-10-CM | POA: Diagnosis present

## 2016-09-04 MED FILL — Verapamil HCl IV Soln 2.5 MG/ML: INTRAVENOUS | Qty: 2 | Status: AC

## 2016-09-04 MED FILL — Heparin Sodium (Porcine) 2 Unit/ML in Sodium Chloride 0.9%: INTRAMUSCULAR | Qty: 1000 | Status: AC

## 2016-09-07 ENCOUNTER — Encounter (HOSPITAL_COMMUNITY): Payer: Self-pay

## 2016-09-07 ENCOUNTER — Encounter (HOSPITAL_COMMUNITY)
Admission: RE | Admit: 2016-09-07 | Discharge: 2016-09-07 | Disposition: A | Discharge status: Home / Self Care | Payer: Medicare Other | Source: Ambulatory Visit | Attending: Internal Medicine | Admitting: Internal Medicine

## 2016-09-07 VITALS — BP 142/72 | HR 54 | Ht 64.0 in | Wt 226.9 lb

## 2016-09-07 DIAGNOSIS — R0602 Shortness of breath: Secondary | ICD-10-CM

## 2016-09-07 NOTE — Progress Notes (Signed)
Brittany Robertson 75 y.o. female Pulmonary Rehab Orientation Note Patient arrived today in Cardiac and Pulmonary Rehab for orientation to Pulmonary Rehab. She was transported from [employer] via wheel chair. She does not carry portable oxygen. Per pt, she does not use oxygen at all.  Color good, skin warm and dry. Patient is oriented to time and place. Patient's medical history, psychosocial health, and medications reviewed. Psychosocial assessment reveals pt lives with their spouse. 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  Her husband is very supportive and they have been married 40 plus years.   Pt reports her stress level is low and has no areas of stress.    Pt does not exhibit  signs of depression. PHQ2/9 score 0/0. Pt shows good  coping skills with positive outlook .  Will continue to monitor psychosocial every 30 days to determine if any psychosocial issues emerge.  Physical assessment reveals heart rate is normal, breath sounds clear to auscultation, no wheezes, rales, or rhonchi. Grip strength equal, strong. Distal pulses 3+ bilateral posterior tibial pulses present. Patient reports she does take medications as prescribed. Patient states she follows a Regular diet. The patient has been trying to lose weight through a healthy diet and exercise program.. Patient's weight will be monitored closely. Demonstration and practice of PLB using pulse oximeter. Patient able to return demonstration satisfactorily. Safety and hand hygiene in the exercise area reviewed with patient. Patient voices understanding of the information reviewed. Department expectations discussed with patient and achievable goals were set. The patient shows enthusiasm about attending the program and we look forward to working with this nice lady. The patient is scheduled for a 6 min walk test on Tuesday, September 08, 2016 @ 3:15 pm and to begin  exercise on Tuesdat, September 15, 2016 in the 1030 class.   [id number] 1045

## 2016-09-08 ENCOUNTER — Encounter (HOSPITAL_COMMUNITY): Payer: Self-pay | Admitting: *Deleted

## 2016-09-08 ENCOUNTER — Encounter (HOSPITAL_COMMUNITY)
Admission: RE | Admit: 2016-09-08 | Discharge: 2016-09-08 | Disposition: A | Discharge status: Home / Self Care | Payer: Medicare Other | Source: Ambulatory Visit | Attending: Internal Medicine | Admitting: Internal Medicine

## 2016-09-08 DIAGNOSIS — R0602 Shortness of breath: Secondary | ICD-10-CM | POA: Diagnosis not present

## 2016-09-08 NOTE — Progress Notes (Signed)
Pulmonary Individual Treatment Plan  Patient Details  Name: Brittany Robertson MRN: [id number] Date of Birth: 02-Jun-1941 Referring Provider:   April Manson Pulmonary Rehab Walk Test from 09/08/2016 in [place]  Referring Provider  dr. Chase Caller      Initial Encounter Date:  Flowsheet Row Pulmonary Rehab Walk Test from 09/08/2016 in [place]  Date  09/08/16  Referring Provider  dr. Chase Caller      Visit Diagnosis: No diagnosis found.  Patient's Home Medications on Admission:   Current Outpatient Prescriptions:  .  acetaminophen (TYLENOL) 500 MG tablet, Take 500-1,000 mg by mouth daily as needed for headache., Disp: , Rfl:  .  aspirin 81 MG tablet, Take 81 mg by mouth every evening. , Disp: , Rfl:  .  atorvastatin (LIPITOR) 20 MG tablet, Take 20 mg by mouth at bedtime. Brand Name Only, Disp: , Rfl:  .  Calcium Carbonate-Vitamin D (CALCIUM + D PO), Take 1 tablet by mouth every other day. , Disp: , Rfl:  .  cetirizine (ZYRTEC) 10 MG tablet, Take 10 mg by mouth every morning.  , Disp: , Rfl:  .  cholecalciferol (VITAMIN D) 400 UNITS TABS, Take 400 Units by mouth every other day. , Disp: , Rfl:  .  Coenzyme Q10 Liposomal 100 MG/ML LIQD, Take 100 mg by mouth every evening., Disp: , Rfl:  .  denosumab (PROLIA) 60 MG/ML SOLN injection, Inject 60 mg into the skin every 6 (six) months. Administer in upper arm, thigh, or abdomen, Disp: , Rfl:  .  docusate sodium (COLACE) 100 MG capsule, Take 100 mg by mouth 2 (two) times daily. , Disp: , Rfl:  .  esomeprazole (NEXIUM) 40 MG capsule, Take 40 mg by mouth daily before breakfast.  , Disp: , Rfl:  .  ezetimibe (ZETIA) 10 MG tablet, Take 1 tablet (10 mg total) by mouth every evening., Disp: 90 tablet, Rfl: 3 .  fish oil-omega-3 fatty acids 1000 MG capsule, Take 1 g by mouth 2 (two) times daily.  , Disp: , Rfl:  .  furosemide (LASIX) 20 MG tablet, Take 20 mg by mouth daily as needed for  fluid or edema. , Disp: , Rfl:  .  meloxicam (MOBIC) 15 MG tablet, Take 15 mg by mouth every evening.  , Disp: , Rfl:  .  methocarbamol (ROBAXIN) 500 MG tablet, Take 500 mg by mouth 4 (four) times daily as needed for muscle spasms., Disp: , Rfl:  .  metoprolol succinate (TOPROL-XL) 25 MG 24 hr tablet, Take 1 tablet by mouth  daily, Disp: 90 tablet, Rfl: 0 .  mirabegron ER (MYRBETRIQ) 25 MG TB24 tablet, Take 25 mg by mouth every evening., Disp: , Rfl:  .  nitroGLYCERIN (NITROSTAT) 0.4 MG SL tablet, Place 1 tablet (0.4 mg total) under the tongue every 5 (five) minutes as needed. As needed for chest pain x 3 doses, Disp: 25 tablet, Rfl: 1 .  olmesartan (BENICAR) 40 MG tablet, Take 1 tablet (40 mg total) by mouth daily., Disp: 90 tablet, Rfl: 3 .  Polyethyl Glycol-Propyl Glycol (SYSTANE ULTRA OP), Apply 1 drop to eye 2 (two) times daily as needed (dry eyes)., Disp: , Rfl:  .  potassium chloride SA (K-DUR,KLOR-CON) 20 MEQ tablet, Take 1 tablet by mouth  every morning, Disp: 90 tablet, Rfl: 0 .  vitamin C (ASCORBIC ACID) 500 MG tablet, Take 500 mg by mouth 2 (two) times daily. , Disp: , Rfl:  .  vitamin E 400 UNIT capsule, Take 400 Units by mouth at bedtime. , Disp: , Rfl:   Past Medical History: Past Medical History:  Diagnosis Date  . Bruises easily   . Chronic diastolic CHF (congestive heart failure) ([place])   . Complication of anesthesia    "takes a long time towake up"  . Coronary artery disease    PCI 2006, repeat cath 30% mid LAD distal to stent otherwise normal coronary arteries, cath 08/2013 with patent LAD stent and otherwise normal coronary arteries with diastolic dysfunction  . DDD (degenerative disc disease), cervical   . Degenerative disk disease   . DOE (dyspnea on exertion)    chronic secondary to obesity, deconditiong and chronic diastolic CHF  . GERD (gastroesophageal reflux disease)   . Hyperlipidemia   . Hypertension   . OA (osteoarthritis)   . Obesity   . Osteopenia   .  PONV (postoperative nausea and vomiting)    Motion sickness when move out of PACU to room  . PVC's (premature ventricular contractions)   . SOB (shortness of breath) 07/13/2016    Tobacco Use: History  Smoking Status  . Never Smoker  Smokeless Tobacco  . Never Used    Labs: Recent Review Flowsheet Data    Labs for ITP Cardiac and Pulmonary Rehab Latest Ref Rng & Units 12/07/2014 04/24/2016 08/31/2016 08/31/2016 08/31/2016   Cholestrol 125 - 200 mg/dL 119 120(L) - - -   LDLCALC <130 mg/dL 56 36 - - -   HDL >=46 mg/dL 41.10 58 - - -   Trlycerides <150 mg/dL 109.0 130 - - -   PHART 7.350 - 7.450 - - 7.412 - -   PCO2ART 32.0 - 48.0 mmHg - - 36.0 - -   HCO3 20.0 - 28.0 mmol/L - - 22.9 23.3 22.2   TCO2 0 - 100 mmol/L - - 24 24 23    ACIDBASEDEF 0.0 - 2.0 mmol/L - - 1.0 1.0 3.0(H)   O2SAT % - - 93.0 68.0 65.0      Capillary Blood Glucose: No results found for: GLUCAP   ADL UCSD:   Pulmonary Function Assessment:     Pulmonary Function Assessment - 09/07/16 0943      Breath   Bilateral Breath Sounds Clear   Shortness of Breath Yes;Limiting activity      Exercise Target Goals: Date: 09/08/16  Exercise Program Goal: Individual exercise prescription set with THRR, safety & activity barriers. Participant demonstrates ability to understand and report RPE using BORG scale, to self-measure pulse accurately, and to acknowledge the importance of the exercise prescription.  Exercise Prescription Goal: Starting with aerobic activity 30 plus minutes a day, 3 days per week for initial exercise prescription. Provide home exercise prescription and guidelines that participant acknowledges understanding prior to discharge.  Activity Barriers & Risk Stratification:     Activity Barriers & Cardiac Risk Stratification - 09/07/16 0939      Activity Barriers & Cardiac Risk Stratification   Activity Barriers Right Knee Replacement;Left Knee Replacement;Arthritis;Back Problems;Shortness of  Breath  degenerative disc disease      6 Minute Walk:     6 Minute Walk    Row Name 09/08/16 1633         6 Minute Walk   Phase Initial     Distance 1020 feet     Walk Time 6 minutes     # of Rest Breaks 0     MPH 1.93     METS 2.45  RPE 14     Perceived Dyspnea  1     Symptoms No     Resting HR 66 bpm     Resting BP 119/80     Max Ex. HR 102 bpm     Max Ex. BP 125/76       Interval HR   Baseline HR 66     1 Minute HR 81     2 Minute HR 94     3 Minute HR 99     4 Minute HR 101     5 Minute HR 98     6 Minute HR 102     2 Minute Post HR 68     Interval Heart Rate? Yes       Interval Oxygen   Interval Oxygen? Yes     Baseline Oxygen Saturation % 97 %     Baseline Liters of Oxygen 0 L     1 Minute Oxygen Saturation % 96 %     1 Minute Liters of Oxygen 0 L     2 Minute Oxygen Saturation % 99 %     2 Minute Liters of Oxygen 0 L     3 Minute Oxygen Saturation % 96 %     3 Minute Liters of Oxygen 0 L     4 Minute Oxygen Saturation % 91 %     4 Minute Liters of Oxygen 0 L     5 Minute Oxygen Saturation % 90 %     5 Minute Liters of Oxygen 0 L     6 Minute Oxygen Saturation % 90 %     6 Minute Liters of Oxygen 0 L     2 Minute Post Oxygen Saturation % 99 %     2 Minute Post Liters of Oxygen 0 L        Initial Exercise Prescription:     Initial Exercise Prescription - 09/08/16 1600      Date of Initial Exercise RX and Referring Provider   Date 09/08/16   Referring Provider dr. Chase Caller     NuStep   Level 2   Minutes 17   METs 1.5     Arm Ergometer   Level 2   Minutes 17     Track   Laps 5   Minutes 17     Prescription Details   Frequency (times per week) 2   Duration Progress to 45 minutes of aerobic exercise without signs/symptoms of physical distress     Intensity   THRR 40-80% of Max Heartrate 57-114   Ratings of Perceived Exertion 11-13   Perceived Dyspnea 0-4     Progression   Progression Continue progressive overload as per  policy without signs/symptoms or physical distress.     Resistance Training   Training Prescription Yes   Weight orange bands   Reps 10-12      Perform Capillary Blood Glucose checks as needed.  Exercise Prescription Changes:   Exercise Comments:   Discharge Exercise Prescription (Final Exercise Prescription Changes):    Nutrition:  Target Goals: Understanding of nutrition guidelines, daily intake of sodium 1500mg , cholesterol 200mg , calories 30% from fat and 7% or less from saturated fats, daily to have 5 or more servings of fruits and vegetables.  Biometrics:     Pre Biometrics - 09/07/16 0944      Pre Biometrics   Grip Strength 24 kg       Nutrition Therapy Plan and Nutrition Goals:  Nutrition Discharge: Rate Your Plate Scores:   Psychosocial: Target Goals: Acknowledge presence or absence of depression, maximize coping skills, provide positive support system. Participant is able to verbalize types and ability to use techniques and skills needed for reducing stress and depression.  Initial Review & Psychosocial Screening:     Initial Psych Review & Screening - 09/07/16 [place]? Yes     Barriers   Psychosocial barriers to participate in program There are no identifiable barriers or psychosocial needs.     Screening Interventions   Interventions Encouraged to exercise      Quality of Life Scores:   PHQ-9: Recent Review Flowsheet Data    Depression screen [facility] 2/9 09/07/2016   Decreased Interest 0   Down, Depressed, Hopeless 0   PHQ - 2 Score 0      Psychosocial Evaluation and Intervention:     Psychosocial Evaluation - 09/07/16 0950      Psychosocial Evaluation & Interventions   Interventions (P)  Encouraged to exercise with the program and follow exercise prescription      Psychosocial Re-Evaluation:  Education: Education Goals: Education classes will be provided on a weekly basis, covering  required topics. Participant will state understanding/return demonstration of topics presented.  Learning Barriers/Preferences:     Learning Barriers/Preferences - 09/07/16 0941      Learning Barriers/Preferences   Learning Barriers None   Learning Preferences Written Material;Video;Verbal Instruction;Skilled Demonstration;Pictoral;Individual Instruction;Group Instruction;Audio      Education Topics: Risk Factor Reduction:  -Group instruction that is supported by a PowerPoint presentation. Instructor discusses the definition of a risk factor, different risk factors for pulmonary disease, and how the heart and lungs work together.     Nutrition for Pulmonary Patient:  -Group instruction provided by PowerPoint slides, verbal discussion, and written materials to support subject matter. The instructor gives an explanation and review of healthy diet recommendations, which includes a discussion on weight management, recommendations for fruit and vegetable consumption, as well as protein, fluid, caffeine, fiber, sodium, sugar, and alcohol. Tips for eating when patients are short of breath are discussed.   Pursed Lip Breathing:  -Group instruction that is supported by demonstration and informational handouts. Instructor discusses the benefits of pursed lip and diaphragmatic breathing and detailed demonstration on how to preform both.     Oxygen Safety:  -Group instruction provided by PowerPoint, verbal discussion, and written material to support subject matter. There is an overview of "What is Oxygen" and "Why do we need it".  Instructor also reviews how to create a safe environment for oxygen use, the importance of using oxygen as prescribed, and the risks of noncompliance. There is a brief discussion on traveling with oxygen and resources the patient may utilize.   Oxygen Equipment:  -Group instruction provided by [facility] Staff utilizing handouts, written materials, and equipment  demonstrations.   Signs and Symptoms:  -Group instruction provided by written material and verbal discussion to support subject matter. Warning signs and symptoms of infection, stroke, and heart attack are reviewed and when to call the physician/911 reinforced. Tips for preventing the spread of infection discussed.   Advanced Directives:  -Group instruction provided by verbal instruction and written material to support subject matter. Instructor reviews Advanced Directive laws and proper instruction for filling out document.   Pulmonary Video:  -Group video education that reviews the importance of medication and oxygen compliance, exercise, good nutrition, pulmonary hygiene, and pursed lip and  diaphragmatic breathing for the pulmonary patient.   Exercise for the Pulmonary Patient:  -Group instruction that is supported by a PowerPoint presentation. Instructor discusses benefits of exercise, core components of exercise, frequency, duration, and intensity of an exercise routine, importance of utilizing pulse oximetry during exercise, safety while exercising, and options of places to exercise outside of rehab.     Pulmonary Medications:  -Verbally interactive group education provided by instructor with focus on inhaled medications and proper administration.   Anatomy and Physiology of the Respiratory System and Intimacy:  -Group instruction provided by PowerPoint, verbal discussion, and written material to support subject matter. Instructor reviews respiratory cycle and anatomical components of the respiratory system and their functions. Instructor also reviews differences in obstructive and restrictive respiratory diseases with examples of each. Intimacy, Sex, and Sexuality differences are reviewed with a discussion on how relationships can change when diagnosed with pulmonary disease. Common sexual concerns are reviewed.   Knowledge Questionnaire Score:   Core Components/Risk  Factors/Patient Goals at Admission:     Personal Goals and Risk Factors at Admission - 09/07/16 0945      Core Components/Risk Factors/Patient Goals on Admission   Increase Strength and Stamina Yes   Improve shortness of breath with ADL's Yes      Core Components/Risk Factors/Patient Goals Review:      Goals and Risk Factor Review    Row Name 09/07/16 0946             Core Components/Risk Factors/Patient Goals Review   Personal Goals Review Increase Strength and Stamina;Improve shortness of breath with ADL's       Review Has been sedentary following 2 knee replacements and feels that her shortness of breath is from her knees limiting her activity          Core Components/Risk Factors/Patient Goals at Discharge (Final Review):      Goals and Risk Factor Review - 09/07/16 0946      Core Components/Risk Factors/Patient Goals Review   Personal Goals Review Increase Strength and Stamina;Improve shortness of breath with ADL's   Review Has been sedentary following 2 knee replacements and feels that her shortness of breath is from her knees limiting her activity      ITP Comments:   Comments:

## 2016-09-15 ENCOUNTER — Encounter (HOSPITAL_COMMUNITY): Admission: RE | Admit: 2016-09-15 | Payer: Medicare Other | Source: Ambulatory Visit

## 2016-09-17 ENCOUNTER — Encounter (HOSPITAL_COMMUNITY): Admission: RE | Admit: 2016-09-17 | Payer: Medicare Other | Source: Ambulatory Visit

## 2016-09-22 ENCOUNTER — Encounter (HOSPITAL_COMMUNITY): Payer: Medicare Other

## 2016-09-24 ENCOUNTER — Encounter (HOSPITAL_COMMUNITY): Payer: Medicare Other

## 2016-09-25 ENCOUNTER — Institutional Professional Consult (permissible substitution): Payer: Medicare Other | Admitting: Pulmonary Disease

## 2016-09-29 ENCOUNTER — Encounter (HOSPITAL_COMMUNITY)
Admission: RE | Admit: 2016-09-29 | Discharge: 2016-09-29 | Disposition: A | Discharge status: Home / Self Care | Payer: Medicare Other | Source: Ambulatory Visit | Attending: Internal Medicine | Admitting: Internal Medicine

## 2016-09-29 VITALS — Wt 217.2 lb

## 2016-09-29 DIAGNOSIS — R0602 Shortness of breath: Secondary | ICD-10-CM | POA: Diagnosis present

## 2016-09-29 NOTE — Progress Notes (Signed)
Daily Session Note  Patient Details  Name: Brittany Robertson MRN: [id number] Date of Birth: 1941-07-08 Referring Provider:   April Manson Pulmonary Rehab Walk Test from 09/08/2016 in [place]  Referring Provider  dr. Chase Caller      Encounter Date: 09/29/2016  Check In:     Session Check In - 09/29/16 1024      Check-In   Location [facility]   Staff Present Rosebud Poles, RN, BSN;Molly diVincenzo, MS, ACSM RCEP, Exercise Physiologist;Pinky Ravan Ysidro Evert, RN;Portia Rollene Rotunda, RN, BSN   Supervising physician immediately available to respond to emergencies [facility] immediately available   Physician(s) Dr. Nada Maclachlan   Medication changes reported     No   Fall or balance concerns reported    No   Warm-up and Cool-down Performed as group-led instruction   Resistance Training Performed Yes   VAD Patient? No     Pain Assessment   Currently in Pain? No/denies   Multiple Pain Sites No      Capillary Blood Glucose: No results found for this or any previous visit (from the past 24 hour(s)).      Exercise Prescription Changes - 09/29/16 1200      Response to Exercise   Blood Pressure (Admit) 120/70   Blood Pressure (Exercise) 120/80   Blood Pressure (Exit) 110/79   Heart Rate (Admit) 58 bpm   Heart Rate (Exercise) 82 bpm   Heart Rate (Exit) 75 bpm   Oxygen Saturation (Admit) 96 %   Oxygen Saturation (Exercise) 100 %   Oxygen Saturation (Exit) 95 %   Rating of Perceived Exertion (Exercise) 13   Perceived Dyspnea (Exercise) 2   Duration Progress to 45 minutes of aerobic exercise without signs/symptoms of physical distress   Intensity --  40-80% of HRR     Progression   Progression Continue to progress workloads to maintain intensity without signs/symptoms of physical distress.     Resistance Training   Training Prescription Yes   Weight orange bands   Reps 10-12  10 minutes of strength training     NuStep   Level 2   Minutes 17   METs 1.8     Arm Ergometer   Level 1  Workload redued due to pt c/o of shoulder pain   Minutes 17     Track   Laps 12   Minutes 17     Goals Met:  Exercise tolerated well No report of cardiac concerns or symptoms Strength training completed today  Goals Unmet:  Not Applicable  Comments: Service time is from 1030 to 1200    Dr. Rush Farmer is Medical Director for Pulmonary Rehab at [facility]

## 2016-10-01 ENCOUNTER — Encounter (HOSPITAL_COMMUNITY)
Admission: RE | Admit: 2016-10-01 | Discharge: 2016-10-01 | Disposition: A | Discharge status: Home / Self Care | Payer: Medicare Other | Source: Ambulatory Visit | Attending: Internal Medicine | Admitting: Internal Medicine

## 2016-10-01 VITALS — Wt 225.3 lb

## 2016-10-01 DIAGNOSIS — R0602 Shortness of breath: Secondary | ICD-10-CM | POA: Diagnosis not present

## 2016-10-01 NOTE — Progress Notes (Signed)
Daily Session Note  Patient Details  Name: Brittany Robertson MRN: [id number] Date of Birth: January 01, 1941 Referring Provider:   April Manson Pulmonary Rehab Walk Test from 09/08/2016 in [place]  Referring Provider  dr. Chase Caller      Encounter Date: 10/01/2016  Check In:     Session Check In - 10/01/16 1051      Check-In   Location [facility]   Staff Present Su Hilt, MS, ACSM RCEP, Exercise Physiologist;Joan Leonia Reeves, RN, Luisa Hart, RN, BSN   Supervising physician immediately available to respond to emergencies [facility] immediately available   Physician(s) DR. Karleen Hampshire   Medication changes reported     No   Fall or balance concerns reported    No   Warm-up and Cool-down Performed as group-led instruction   Resistance Training Performed Yes   VAD Patient? No     Pain Assessment   Currently in Pain? No/denies   Multiple Pain Sites No      Capillary Blood Glucose: No results found for this or any previous visit (from the past 24 hour(s)).      Exercise Prescription Changes - 10/01/16 1200      Response to Exercise   Blood Pressure (Admit) 168/104   Blood Pressure (Exercise) 142/80   Blood Pressure (Exit) 104/72   Heart Rate (Admit) 60 bpm   Heart Rate (Exercise) 85 bpm   Heart Rate (Exit) 72 bpm   Oxygen Saturation (Admit) 98 %   Oxygen Saturation (Exercise) 92 %   Oxygen Saturation (Exit) 98 %   Rating of Perceived Exertion (Exercise) 13   Perceived Dyspnea (Exercise) 3   Duration Progress to 45 minutes of aerobic exercise without signs/symptoms of physical distress   Intensity --  40-80% of HRR     Progression   Progression Continue to progress workloads to maintain intensity without signs/symptoms of physical distress.     Resistance Training   Training Prescription Yes   Weight orange bands   Reps 10-12  10 minutes of strength training     NuStep   Level 2   Minutes 17   METs  1.8     Arm Ergometer   Level --  Workload redued due to pt c/o of shoulder pain     Track   Laps 12   Minutes 17     Goals Met:  Exercise tolerated well No report of cardiac concerns or symptoms Strength training completed today  Goals Unmet:  Not Applicable  Comments: Service time is from 10:30am to 12:30pm    Dr. Rush Farmer is Medical Director for Pulmonary Rehab at [facility]

## 2016-10-06 ENCOUNTER — Encounter (HOSPITAL_COMMUNITY)
Admission: RE | Admit: 2016-10-06 | Discharge: 2016-10-06 | Disposition: A | Discharge status: Home / Self Care | Payer: Medicare Other | Source: Ambulatory Visit | Attending: Internal Medicine | Admitting: Internal Medicine

## 2016-10-06 VITALS — Wt 225.5 lb

## 2016-10-06 DIAGNOSIS — R0602 Shortness of breath: Secondary | ICD-10-CM

## 2016-10-06 NOTE — Progress Notes (Signed)
Daily Session Note  Patient Details  Name: Brittany Robertson MRN: [id number] Date of Birth: 1941/04/21 Referring Provider:   April Manson Pulmonary Rehab Walk Test from 09/08/2016 in [place]  Referring Provider  dr. Chase Caller      Encounter Date: 10/06/2016  Check In:   Capillary Blood Glucose: No results found for this or any previous visit (from the past 24 hour(s)).      Exercise Prescription Changes - 10/06/16 1200      Exercise Review   Progression Yes     Response to Exercise   Blood Pressure (Admit) 130/80   Blood Pressure (Exercise) 120/80   Blood Pressure (Exit) 110/66   Heart Rate (Admit) 59 bpm   Heart Rate (Exercise) 83 bpm   Heart Rate (Exit) 70 bpm   Oxygen Saturation (Admit) 100 %   Oxygen Saturation (Exercise) 96 %   Oxygen Saturation (Exit) 97 %   Rating of Perceived Exertion (Exercise) 13   Perceived Dyspnea (Exercise) 2   Duration Progress to 45 minutes of aerobic exercise without signs/symptoms of physical distress   Intensity --  40-80% of HRR     Progression   Progression Continue to progress workloads to maintain intensity without signs/symptoms of physical distress.     Resistance Training   Training Prescription Yes   Weight orange bands   Reps 10-12  10 minutes of strength training     Recumbant Bike   Level 2   Minutes 17     NuStep   Level 3   Minutes 17   METs 1.8     Arm Ergometer   Level --  Workload redued due to pt c/o of shoulder pain     Track   Laps 14   Minutes 17     Goals Met:  Exercise tolerated well No report of cardiac concerns or symptoms Strength training completed today  Goals Unmet:  Not Applicable  Comments: Service time is from 10:30am to 12:00pm    Dr. Rush Farmer is Medical Director for Pulmonary Rehab at [facility].

## 2016-10-08 ENCOUNTER — Encounter (HOSPITAL_COMMUNITY)
Admission: RE | Admit: 2016-10-08 | Discharge: 2016-10-08 | Disposition: A | Discharge status: Home / Self Care | Payer: Medicare Other | Source: Ambulatory Visit | Attending: Internal Medicine | Admitting: Internal Medicine

## 2016-10-08 VITALS — Wt 225.8 lb

## 2016-10-08 DIAGNOSIS — R0602 Shortness of breath: Secondary | ICD-10-CM | POA: Diagnosis not present

## 2016-10-08 NOTE — Progress Notes (Signed)
Daily Session Note  Patient Details  Name: Brittany Robertson MRN: [id number] Date of Birth: 1941/08/19 Referring Provider:   April Manson Pulmonary Rehab Walk Test from 09/08/2016 in [place]  Referring Provider  dr. Chase Caller      Encounter Date: 10/08/2016  Check In:     Session Check In - 10/08/16 1010      Check-In   Location [facility]   Staff Present Rosebud Poles, RN, BSN;Molly diVincenzo, MS, ACSM RCEP, Exercise Physiologist;Portia Rollene Rotunda, RN, BSN   Supervising physician immediately available to respond to emergencies [facility] immediately available   Physician(s) Dr. Ree Kida   Medication changes reported     No   Fall or balance concerns reported    No   Warm-up and Cool-down Performed as group-led instruction   Resistance Training Performed Yes   VAD Patient? No     Pain Assessment   Currently in Pain? No/denies   Multiple Pain Sites No      Capillary Blood Glucose: No results found for this or any previous visit (from the past 24 hour(s)).      Exercise Prescription Changes - 10/08/16 1200      Exercise Review   Progression Yes     Response to Exercise   Blood Pressure (Admit) 106/60   Blood Pressure (Exercise) 114/70   Blood Pressure (Exit) 104/60   Heart Rate (Admit) 56 bpm   Heart Rate (Exercise) 97 bpm   Heart Rate (Exit) 71 bpm   Oxygen Saturation (Admit) 100 %   Oxygen Saturation (Exercise) 94 %   Oxygen Saturation (Exit) 98 %   Rating of Perceived Exertion (Exercise) 13   Perceived Dyspnea (Exercise) 2   Duration Progress to 45 minutes of aerobic exercise without signs/symptoms of physical distress   Intensity THRR unchanged     Progression   Progression Continue to progress workloads to maintain intensity without signs/symptoms of physical distress.     Resistance Training   Training Prescription Yes   Weight orange bands   Reps 10-12  10 minutes of strength training     Recumbant Bike   Level 3   Minutes 17     Track   Laps 14   Minutes 17     Goals Met:  Exercise tolerated well Strength training completed today  Goals Unmet:  Not Applicable  Comments: Service time is from 1030 to 1200    Dr. Rush Farmer is Medical Director for Pulmonary Rehab at [facility].

## 2016-10-13 ENCOUNTER — Encounter (HOSPITAL_COMMUNITY)
Admission: RE | Admit: 2016-10-13 | Discharge: 2016-10-13 | Disposition: A | Discharge status: Home / Self Care | Payer: Medicare Other | Source: Ambulatory Visit | Attending: Internal Medicine | Admitting: Internal Medicine

## 2016-10-13 VITALS — Wt 225.8 lb

## 2016-10-13 DIAGNOSIS — R0602 Shortness of breath: Secondary | ICD-10-CM | POA: Diagnosis not present

## 2016-10-13 NOTE — Progress Notes (Signed)
Daily Session Note  Patient Details  Name: Brittany Robertson MRN: [id number] Date of Birth: 09/27/1941 Referring Provider:   April Manson Pulmonary Rehab Walk Test from 09/08/2016 in [place]  Referring Provider  dr. Chase Caller      Encounter Date: 10/13/2016  Check In:     Session Check In - 10/13/16 1019      Check-In   Location [facility]   Staff Present Rosebud Poles, RN, Luisa Hart, RN, BSN;Ramon Dredge, RN, [facility]   Supervising physician immediately available to respond to emergencies [facility] immediately available   Physician(s) Dr. Ree Kida   Medication changes reported     No   Fall or balance concerns reported    No   Warm-up and Cool-down Performed as group-led instruction   Resistance Training Performed Yes   VAD Patient? No     Pain Assessment   Currently in Pain? No/denies   Multiple Pain Sites No      Capillary Blood Glucose: No results found for this or any previous visit (from the past 24 hour(s)).      Exercise Prescription Changes - 10/13/16 1227      Response to Exercise   Blood Pressure (Admit) 112/60   Blood Pressure (Exercise) 112/66   Blood Pressure (Exit) 100/62   Heart Rate (Admit) 59 bpm   Heart Rate (Exercise) 84 bpm   Heart Rate (Exit) 69 bpm   Oxygen Saturation (Admit) 100 %   Oxygen Saturation (Exercise) 94 %   Oxygen Saturation (Exit) 96 %   Rating of Perceived Exertion (Exercise) 13   Perceived Dyspnea (Exercise) 2   Duration Progress to 45 minutes of aerobic exercise without signs/symptoms of physical distress   Intensity THRR unchanged     Progression   Progression Continue to progress workloads to maintain intensity without signs/symptoms of physical distress.     Resistance Training   Training Prescription Yes   Weight orange bands   Reps 10-12  10 minutes of strength training     Recumbant Bike   Level 2.5  decreased workload r/t sore legs   Minutes 17     NuStep   Level 3   Minutes 17   METs 1.7     Track   Laps 14   Minutes 17     Goals Met:  Improved SOB with ADL's Using PLB without cueing & demonstrates good technique Exercise tolerated well No report of cardiac concerns or symptoms Strength training completed today  Goals Unmet:  Not Applicable  Comments: Service time is from 1030 to 1230   Dr. Rush Farmer is Medical Director for Pulmonary Rehab at [facility].

## 2016-10-15 ENCOUNTER — Encounter (HOSPITAL_COMMUNITY)
Admission: RE | Admit: 2016-10-15 | Discharge: 2016-10-15 | Disposition: A | Discharge status: Home / Self Care | Payer: Medicare Other | Source: Ambulatory Visit | Attending: Internal Medicine | Admitting: Internal Medicine

## 2016-10-15 VITALS — Wt 225.5 lb

## 2016-10-15 DIAGNOSIS — R0602 Shortness of breath: Secondary | ICD-10-CM | POA: Diagnosis not present

## 2016-10-15 NOTE — Progress Notes (Signed)
Pulmonary Individual Treatment Plan  Patient Details  Name: Brittany Robertson MRN: [id number] Date of Birth: 1941-05-02 Referring Provider:   April Manson Pulmonary Rehab Walk Test from 09/08/2016 in [place]  Referring Provider  dr. Chase Caller      Initial Encounter Date:  Flowsheet Row Pulmonary Rehab Walk Test from 09/08/2016 in [place]  Date  09/08/16  Referring Provider  dr. Chase Caller      Visit Diagnosis: SOB (shortness of breath)  Patient's Home Medications on Admission:   Current Outpatient Prescriptions:  .  acetaminophen (TYLENOL) 500 MG tablet, Take 500-1,000 mg by mouth daily as needed for headache., Disp: , Rfl:  .  aspirin 81 MG tablet, Take 81 mg by mouth every evening. , Disp: , Rfl:  .  atorvastatin (LIPITOR) 20 MG tablet, Take 20 mg by mouth at bedtime. Brand Name Only, Disp: , Rfl:  .  Calcium Carbonate-Vitamin D (CALCIUM + D PO), Take 1 tablet by mouth every other day. , Disp: , Rfl:  .  cetirizine (ZYRTEC) 10 MG tablet, Take 10 mg by mouth every morning.  , Disp: , Rfl:  .  cholecalciferol (VITAMIN D) 400 UNITS TABS, Take 400 Units by mouth every other day. , Disp: , Rfl:  .  Coenzyme Q10 Liposomal 100 MG/ML LIQD, Take 100 mg by mouth every evening., Disp: , Rfl:  .  denosumab (PROLIA) 60 MG/ML SOLN injection, Inject 60 mg into the skin every 6 (six) months. Administer in upper arm, thigh, or abdomen, Disp: , Rfl:  .  docusate sodium (COLACE) 100 MG capsule, Take 100 mg by mouth 2 (two) times daily. , Disp: , Rfl:  .  esomeprazole (NEXIUM) 40 MG capsule, Take 40 mg by mouth daily before breakfast.  , Disp: , Rfl:  .  ezetimibe (ZETIA) 10 MG tablet, Take 1 tablet (10 mg total) by mouth every evening., Disp: 90 tablet, Rfl: 3 .  fish oil-omega-3 fatty acids 1000 MG capsule, Take 1 g by mouth 2 (two) times daily.  , Disp: , Rfl:  .  furosemide (LASIX) 20 MG tablet, Take 20 mg by mouth daily as  needed for fluid or edema. , Disp: , Rfl:  .  meloxicam (MOBIC) 15 MG tablet, Take 15 mg by mouth every evening.  , Disp: , Rfl:  .  methocarbamol (ROBAXIN) 500 MG tablet, Take 500 mg by mouth 4 (four) times daily as needed for muscle spasms., Disp: , Rfl:  .  metoprolol succinate (TOPROL-XL) 25 MG 24 hr tablet, Take 1 tablet by mouth  daily, Disp: 90 tablet, Rfl: 0 .  mirabegron ER (MYRBETRIQ) 25 MG TB24 tablet, Take 25 mg by mouth every evening., Disp: , Rfl:  .  nitroGLYCERIN (NITROSTAT) 0.4 MG SL tablet, Place 1 tablet (0.4 mg total) under the tongue every 5 (five) minutes as needed. As needed for chest pain x 3 doses, Disp: 25 tablet, Rfl: 1 .  olmesartan (BENICAR) 40 MG tablet, Take 1 tablet (40 mg total) by mouth daily., Disp: 90 tablet, Rfl: 3 .  Polyethyl Glycol-Propyl Glycol (SYSTANE ULTRA OP), Apply 1 drop to eye 2 (two) times daily as needed (dry eyes)., Disp: , Rfl:  .  potassium chloride SA (K-DUR,KLOR-CON) 20 MEQ tablet, Take 1 tablet by mouth  every morning, Disp: 90 tablet, Rfl: 0 .  vitamin C (ASCORBIC ACID) 500 MG tablet, Take 500 mg by mouth 2 (two) times daily. , Disp: , Rfl:  .  vitamin E 400 UNIT capsule, Take 400 Units by mouth at bedtime. , Disp: , Rfl:   Past Medical History: Past Medical History:  Diagnosis Date  . Bruises easily   . Chronic diastolic CHF (congestive heart failure) ([place])   . Complication of anesthesia    "takes a long time towake up"  . Coronary artery disease    PCI 2006, repeat cath 30% mid LAD distal to stent otherwise normal coronary arteries, cath 08/2013 with patent LAD stent and otherwise normal coronary arteries with diastolic dysfunction  . DDD (degenerative disc disease), cervical   . Degenerative disk disease   . DOE (dyspnea on exertion)    chronic secondary to obesity, deconditiong and chronic diastolic CHF  . GERD (gastroesophageal reflux disease)   . Hyperlipidemia   . Hypertension   . OA (osteoarthritis)   . Obesity   .  Osteopenia   . PONV (postoperative nausea and vomiting)    Motion sickness when move out of PACU to room  . PVC's (premature ventricular contractions)   . SOB (shortness of breath) 07/13/2016    Tobacco Use: History  Smoking Status  . Never Smoker  Smokeless Tobacco  . Never Used    Labs: Recent Review Flowsheet Data    Labs for ITP Cardiac and Pulmonary Rehab Latest Ref Rng & Units 12/07/2014 04/24/2016 08/31/2016 08/31/2016 08/31/2016   Cholestrol 125 - 200 mg/dL 119 120(L) - - -   LDLCALC <130 mg/dL 56 36 - - -   HDL >=46 mg/dL 41.10 58 - - -   Trlycerides <150 mg/dL 109.0 130 - - -   PHART 7.350 - 7.450 - - 7.412 - -   PCO2ART 32.0 - 48.0 mmHg - - 36.0 - -   HCO3 20.0 - 28.0 mmol/L - - 22.9 23.3 22.2   TCO2 0 - 100 mmol/L - - '24 24 23   '$ ACIDBASEDEF 0.0 - 2.0 mmol/L - - 1.0 1.0 3.0(H)   O2SAT % - - 93.0 68.0 65.0      Capillary Blood Glucose: No results found for: GLUCAP   ADL UCSD:     Pulmonary Assessment Scores    Row Name 09/08/16 1639         ADL UCSD   ADL Phase Entry     SOB Score total 41        Pulmonary Function Assessment:     Pulmonary Function Assessment - 09/07/16 0943      Breath   Bilateral Breath Sounds Clear   Shortness of Breath Yes;Limiting activity      Exercise Target Goals:    Exercise Program Goal: Individual exercise prescription set with THRR, safety & activity barriers. Participant demonstrates ability to understand and report RPE using BORG scale, to self-measure pulse accurately, and to acknowledge the importance of the exercise prescription.  Exercise Prescription Goal: Starting with aerobic activity 30 plus minutes a day, 3 days per week for initial exercise prescription. Provide home exercise prescription and guidelines that participant acknowledges understanding prior to discharge.  Activity Barriers & Risk Stratification:     Activity Barriers & Cardiac Risk Stratification - 09/07/16 0939      Activity Barriers  & Cardiac Risk Stratification   Activity Barriers Right Knee Replacement;Left Knee Replacement;Arthritis;Back Problems;Shortness of Breath  degenerative disc disease      6 Minute Walk:     6 Minute Walk    Row Name 09/08/16 1633         6 Minute Walk  Phase Initial     Distance 1020 feet     Walk Time 6 minutes     # of Rest Breaks 0     MPH 1.93     METS 2.45     RPE 14     Perceived Dyspnea  1     Symptoms No     Resting HR 66 bpm     Resting BP 119/80     Max Ex. HR 102 bpm     Max Ex. BP 125/76       Interval HR   Baseline HR 66     1 Minute HR 81     2 Minute HR 94     3 Minute HR 99     4 Minute HR 101     5 Minute HR 98     6 Minute HR 102     2 Minute Post HR 68     Interval Heart Rate? Yes       Interval Oxygen   Interval Oxygen? Yes     Baseline Oxygen Saturation % 97 %     Baseline Liters of Oxygen 0 L     1 Minute Oxygen Saturation % 96 %     1 Minute Liters of Oxygen 0 L     2 Minute Oxygen Saturation % 99 %     2 Minute Liters of Oxygen 0 L     3 Minute Oxygen Saturation % 96 %     3 Minute Liters of Oxygen 0 L     4 Minute Oxygen Saturation % 91 %     4 Minute Liters of Oxygen 0 L     5 Minute Oxygen Saturation % 90 %     5 Minute Liters of Oxygen 0 L     6 Minute Oxygen Saturation % 90 %     6 Minute Liters of Oxygen 0 L     2 Minute Post Oxygen Saturation % 99 %     2 Minute Post Liters of Oxygen 0 L        Initial Exercise Prescription:     Initial Exercise Prescription - 09/08/16 1600      Date of Initial Exercise RX and Referring Provider   Date 09/08/16   Referring Provider dr. Chase Caller     NuStep   Level 2   Minutes 17   METs 1.5     Arm Ergometer   Level 2   Minutes 17     Track   Laps 5   Minutes 17     Prescription Details   Frequency (times per week) 2   Duration Progress to 45 minutes of aerobic exercise without signs/symptoms of physical distress     Intensity   THRR 40-80% of Max Heartrate 57-114    Ratings of Perceived Exertion 11-13   Perceived Dyspnea 0-4     Progression   Progression Continue progressive overload as per policy without signs/symptoms or physical distress.     Resistance Training   Training Prescription Yes   Weight orange bands   Reps 10-12      Perform Capillary Blood Glucose checks as needed.  Exercise Prescription Changes:     Exercise Prescription Changes    Row Name 09/29/16 1200 10/01/16 1200 10/06/16 1200 10/08/16 1200 10/13/16 1227     Exercise Review   Progression  -  - Yes Yes  -     Response to Exercise  Blood Pressure (Admit) 120/70 168/104 130/80 106/60 112/60   Blood Pressure (Exercise) 120/80 142/80 120/80 114/70 112/66   Blood Pressure (Exit) 110/79 104/72 110/66 104/60 100/62   Heart Rate (Admit) 58 bpm 60 bpm 59 bpm 56 bpm 59 bpm   Heart Rate (Exercise) 82 bpm 85 bpm 83 bpm 97 bpm 84 bpm   Heart Rate (Exit) 75 bpm 72 bpm 70 bpm 71 bpm 69 bpm   Oxygen Saturation (Admit) 96 % 98 % 100 % 100 % 100 %   Oxygen Saturation (Exercise) 100 % 92 % 96 % 94 % 94 %   Oxygen Saturation (Exit) 95 % 98 % 97 % 98 % 96 %   Rating of Perceived Exertion (Exercise) '13 13 13 13 13   '$ Perceived Dyspnea (Exercise) '2 3 2 2 2   '$ Duration Progress to 45 minutes of aerobic exercise without signs/symptoms of physical distress Progress to 45 minutes of aerobic exercise without signs/symptoms of physical distress Progress to 45 minutes of aerobic exercise without signs/symptoms of physical distress Progress to 45 minutes of aerobic exercise without signs/symptoms of physical distress Progress to 45 minutes of aerobic exercise without signs/symptoms of physical distress   Intensity -  40-80% of HRR -  40-80% of HRR -  40-80% of HRR THRR unchanged THRR unchanged     Progression   Progression Continue to progress workloads to maintain intensity without signs/symptoms of physical distress. Continue to progress workloads to maintain intensity without signs/symptoms  of physical distress. Continue to progress workloads to maintain intensity without signs/symptoms of physical distress. Continue to progress workloads to maintain intensity without signs/symptoms of physical distress. Continue to progress workloads to maintain intensity without signs/symptoms of physical distress.     Resistance Training   Training Prescription Yes Yes Yes Yes Yes   Weight orange bands orange bands orange bands orange bands orange bands   Reps 10-12  10 minutes of strength training 10-12  10 minutes of strength training 10-12  10 minutes of strength training 10-12  10 minutes of strength training 10-12  10 minutes of strength training     Recumbant Bike   Level  -  - 2 3 2.5  decreased workload r/t sore legs   Minutes  -  - '17 17 17     '$ NuStep   Level '2 2 3  '$ - 3   Minutes '17 17 17  '$ - 17   METs 1.8 1.8 1.8  - 1.7     Arm Ergometer   Level 1  Workload redued due to pt c/o of shoulder pain -  Workload redued due to pt c/o of shoulder pain -  Workload redued due to pt c/o of shoulder pain  -  -   Minutes 17  -  -  -  -     Track   Laps '12 12 14 14 14   '$ Minutes '17 17 17 17 17      '$ Exercise Comments:     Exercise Comments    Row Name 10/06/16 [phone]           Exercise Comments Patient has only attended two sessions. Will cont. to monitor.           Discharge Exercise Prescription (Final Exercise Prescription Changes):     Exercise Prescription Changes - 10/13/16 1227      Response to Exercise   Blood Pressure (Admit) 112/60   Blood Pressure (Exercise) 112/66   Blood Pressure (Exit) 100/62  Heart Rate (Admit) 59 bpm   Heart Rate (Exercise) 84 bpm   Heart Rate (Exit) 69 bpm   Oxygen Saturation (Admit) 100 %   Oxygen Saturation (Exercise) 94 %   Oxygen Saturation (Exit) 96 %   Rating of Perceived Exertion (Exercise) 13   Perceived Dyspnea (Exercise) 2   Duration Progress to 45 minutes of aerobic exercise without signs/symptoms of physical  distress   Intensity THRR unchanged     Progression   Progression Continue to progress workloads to maintain intensity without signs/symptoms of physical distress.     Resistance Training   Training Prescription Yes   Weight orange bands   Reps 10-12  10 minutes of strength training     Recumbant Bike   Level 2.5  decreased workload r/t sore legs   Minutes 17     NuStep   Level 3   Minutes 17   METs 1.7     Track   Laps 14   Minutes 17       Nutrition:  Target Goals: Understanding of nutrition guidelines, daily intake of sodium '1500mg'$ , cholesterol '200mg'$ , calories 30% from fat and 7% or less from saturated fats, daily to have 5 or more servings of fruits and vegetables.  Biometrics:     Pre Biometrics - 09/07/16 0944      Pre Biometrics   Grip Strength 24 kg       Nutrition Therapy Plan and Nutrition Goals:   Nutrition Discharge: Rate Your Plate Scores:     Nutrition Assessments - 10/01/16 1141      Rate Your Plate Scores   Pre Score 58      Psychosocial: Target Goals: Acknowledge presence or absence of depression, maximize coping skills, provide positive support system. Participant is able to verbalize types and ability to use techniques and skills needed for reducing stress and depression.  Initial Review & Psychosocial Screening:     Initial Psych Review & Screening - 09/07/16 [place]? Yes     Barriers   Psychosocial barriers to participate in program There are no identifiable barriers or psychosocial needs.     Screening Interventions   Interventions Encouraged to exercise      Quality of Life Scores:     Quality of Life - 09/08/16 1639      Quality of Life Scores   Health/Function Pre 27.18 %   Socioeconomic Pre 29.64 %   Psych/Spiritual Pre 29.14 %   Family Pre 28.13 %   GLOBAL Pre 27.86 %      PHQ-9: Recent Review Flowsheet Data    Depression screen [facility] 2/9 09/07/2016    Decreased Interest 0   Down, Depressed, Hopeless 0   PHQ - 2 Score 0      Psychosocial Evaluation and Intervention:     Psychosocial Evaluation - 10/13/16 0831      Psychosocial Evaluation & Interventions   Interventions Encouraged to exercise with the program and follow exercise prescription      Psychosocial Re-Evaluation:     Psychosocial Re-Evaluation    [place] Name 10/13/16 0831             Psychosocial Re-Evaluation   Interventions Encouraged to attend Pulmonary Rehabilitation for the exercise       Comments no psychosocial barriers identified over the past 30 days         Education: Education Goals: Education classes will be provided on a weekly  basis, covering required topics. Participant will state understanding/return demonstration of topics presented.  Learning Barriers/Preferences:     Learning Barriers/Preferences - 09/07/16 0941      Learning Barriers/Preferences   Learning Barriers None   Learning Preferences Written Material;Video;Verbal Instruction;Skilled Demonstration;Pictoral;Individual Instruction;Group Instruction;Audio      Education Topics: Risk Factor Reduction:  -Group instruction that is supported by a PowerPoint presentation. Instructor discusses the definition of a risk factor, different risk factors for pulmonary disease, and how the heart and lungs work together.     Nutrition for Pulmonary Patient:  -Group instruction provided by PowerPoint slides, verbal discussion, and written materials to support subject matter. The instructor gives an explanation and review of healthy diet recommendations, which includes a discussion on weight management, recommendations for fruit and vegetable consumption, as well as protein, fluid, caffeine, fiber, sodium, sugar, and alcohol. Tips for eating when patients are short of breath are discussed.   Pursed Lip Breathing:  -Group instruction that is supported by demonstration and informational handouts.  Instructor discusses the benefits of pursed lip and diaphragmatic breathing and detailed demonstration on how to preform both.   Flowsheet Row PULMONARY REHAB OTHER RESPIRATORY from 10/08/2016 in [place]  Date  10/08/16  Educator  ep  Instruction Review Code  2- meets goals/outcomes      Oxygen Safety:  -Group instruction provided by PowerPoint, verbal discussion, and written material to support subject matter. There is an overview of "What is Oxygen" and "Why do we need it".  Instructor also reviews how to create a safe environment for oxygen use, the importance of using oxygen as prescribed, and the risks of noncompliance. There is a brief discussion on traveling with oxygen and resources the patient may utilize.   Oxygen Equipment:  -Group instruction provided by [facility] Staff utilizing handouts, written materials, and equipment demonstrations.   Signs and Symptoms:  -Group instruction provided by written material and verbal discussion to support subject matter. Warning signs and symptoms of infection, stroke, and heart attack are reviewed and when to call the physician/911 reinforced. Tips for preventing the spread of infection discussed.   Advanced Directives:  -Group instruction provided by verbal instruction and written material to support subject matter. Instructor reviews Advanced Directive laws and proper instruction for filling out document.   Pulmonary Video:  -Group video education that reviews the importance of medication and oxygen compliance, exercise, good nutrition, pulmonary hygiene, and pursed lip and diaphragmatic breathing for the pulmonary patient.   Exercise for the Pulmonary Patient:  -Group instruction that is supported by a PowerPoint presentation. Instructor discusses benefits of exercise, core components of exercise, frequency, duration, and intensity of an exercise routine, importance of utilizing pulse oximetry during  exercise, safety while exercising, and options of places to exercise outside of rehab.     Pulmonary Medications:  -Verbally interactive group education provided by instructor with focus on inhaled medications and proper administration.   Anatomy and Physiology of the Respiratory System and Intimacy:  -Group instruction provided by PowerPoint, verbal discussion, and written material to support subject matter. Instructor reviews respiratory cycle and anatomical components of the respiratory system and their functions. Instructor also reviews differences in obstructive and restrictive respiratory diseases with examples of each. Intimacy, Sex, and Sexuality differences are reviewed with a discussion on how relationships can change when diagnosed with pulmonary disease. Common sexual concerns are reviewed. Flowsheet Row PULMONARY REHAB OTHER RESPIRATORY from 10/08/2016 in [place]  Date  10/01/16  Educator  RN  Instruction Review Code  2- meets goals/outcomes      Knowledge Questionnaire Score:     Knowledge Questionnaire Score - 09/08/16 1638      Knowledge Questionnaire Score   Pre Score 6/13      Core Components/Risk Factors/Patient Goals at Admission:     Personal Goals and Risk Factors at Admission - 09/07/16 0945      Core Components/Risk Factors/Patient Goals on Admission   Increase Strength and Stamina Yes   Improve shortness of breath with ADL's Yes      Core Components/Risk Factors/Patient Goals Review:      Goals and Risk Factor Review    Row Name 09/07/16 0946 10/13/16 0830           Core Components/Risk Factors/Patient Goals Review   Personal Goals Review Increase Strength and Stamina;Improve shortness of breath with ADL's Increase Strength and Stamina;Improve shortness of breath with ADL's      Review Has been sedentary following 2 knee replacements and feels that her shortness of breath is from her knees limiting her  activity see comments section on ITP      Expected Outcomes  - see expected outcomes on Admission documentation         Core Components/Risk Factors/Patient Goals at Discharge (Final Review):      Goals and Risk Factor Review - 10/13/16 0830      Core Components/Risk Factors/Patient Goals Review   Personal Goals Review Increase Strength and Stamina;Improve shortness of breath with ADL's   Review see comments section on ITP   Expected Outcomes see expected outcomes on Admission documentation      ITP Comments:   Comments: ITP REVIEW Pt is making expected progress toward pulmonary rehab goals after completing 5 sessions. She states she is seeing a significant increase in her stamina and strength. Recommend continued exercise, life style modification, education, and utilization of breathing techniques to increase stamina and strength and decrease shortness of breath with exertion.

## 2016-10-15 NOTE — Progress Notes (Signed)
Daily Session Note  Patient Details  Name: Brittany Robertson MRN: [id number] Date of Birth: 1941/01/24 Referring Provider:   April Manson Pulmonary Rehab Walk Test from 09/08/2016 in [place]  Referring Provider  dr. Chase Caller      Encounter Date: 10/15/2016  Check In:     Session Check In - 10/15/16 1030      Check-In   Location [facility]   Staff Present Rodney Langton, RN;Joan Leonia Reeves, RN, Luisa Hart, RN, BSN;Ramon Dredge, RN, [facility]   Supervising physician immediately available to respond to emergencies [facility] immediately available   Physician(s) Dr. Wynetta Emery   Medication changes reported     No   Fall or balance concerns reported    No   Warm-up and Cool-down Performed as group-led instruction   Resistance Training Performed Yes   VAD Patient? No     Pain Assessment   Currently in Pain? No/denies   Multiple Pain Sites No      Capillary Blood Glucose: No results found for this or any previous visit (from the past 24 hour(s)).      Exercise Prescription Changes - 10/15/16 1200      Response to Exercise   Blood Pressure (Admit) 136/74   Blood Pressure (Exercise) 110/64   Blood Pressure (Exit) 122/80   Heart Rate (Admit) 57 bpm   Heart Rate (Exercise) 88 bpm   Heart Rate (Exit) 73 bpm   Oxygen Saturation (Admit) 96 %   Oxygen Saturation (Exercise) 96 %   Oxygen Saturation (Exit) 99 %   Rating of Perceived Exertion (Exercise) 13   Perceived Dyspnea (Exercise) 2   Duration Progress to 45 minutes of aerobic exercise without signs/symptoms of physical distress   Intensity THRR unchanged     Progression   Progression Continue to progress workloads to maintain intensity without signs/symptoms of physical distress.     Resistance Training   Training Prescription Yes   Weight orange bands   Reps 10-12  10 minutes of strength training     NuStep   Level 3   Minutes 17   METs 2.1     Track    Laps 15   Minutes 17     Goals Met:  Exercise tolerated well No report of cardiac concerns or symptoms Strength training completed today  Goals Unmet:  Not Applicable  Comments: Service time is from 1030 to 1215    Dr. Rush Farmer is Medical Director for Pulmonary Rehab at [facility].

## 2016-10-20 ENCOUNTER — Encounter (HOSPITAL_COMMUNITY): Payer: Medicare Other

## 2016-10-22 ENCOUNTER — Encounter (HOSPITAL_COMMUNITY): Payer: Medicare Other

## 2016-10-26 ENCOUNTER — Telehealth (HOSPITAL_COMMUNITY): Payer: Self-pay | Admitting: *Deleted

## 2016-10-27 ENCOUNTER — Encounter (HOSPITAL_COMMUNITY): Admission: RE | Admit: 2016-10-27 | Payer: Medicare Other | Source: Ambulatory Visit

## 2016-10-29 ENCOUNTER — Encounter (HOSPITAL_COMMUNITY): Payer: Medicare Other

## 2016-10-30 ENCOUNTER — Ambulatory Visit (INDEPENDENT_AMBULATORY_CARE_PROVIDER_SITE_OTHER): Payer: Medicare Other | Admitting: Pulmonary Disease

## 2016-10-30 ENCOUNTER — Encounter: Payer: Self-pay | Admitting: Pulmonary Disease

## 2016-10-30 DIAGNOSIS — E669 Obesity, unspecified: Secondary | ICD-10-CM | POA: Diagnosis not present

## 2016-10-30 DIAGNOSIS — R0609 Other forms of dyspnea: Secondary | ICD-10-CM

## 2016-10-30 DIAGNOSIS — G4710 Hypersomnia, unspecified: Secondary | ICD-10-CM | POA: Diagnosis not present

## 2016-10-30 DIAGNOSIS — Z6836 Body mass index (BMI) 36.0-36.9, adult: Secondary | ICD-10-CM

## 2016-10-30 NOTE — Assessment & Plan Note (Signed)
Weight reduction 

## 2016-10-30 NOTE — Assessment & Plan Note (Signed)
Likely related to obesity, restrictive ventilatory defect, diastolic dysfunction. Currently on pulmonary rehabilitation. Feels better overall.

## 2016-10-30 NOTE — Assessment & Plan Note (Signed)
Patient has snoring, occasional gasping and choking, has frequent awakenings at night.  Sleeps from 7-8 pm until 8 am.  Has always been a long sleeper. Wakes up refreshed in am usually but with episodes of hypersomnia.  (-) abnormal behavior in sleep. Hypersomnia affects her fxnality.   She does pulm rehab and SOB is better.   Not on O2.   ESS 6.   Plan :  We discussed about the diagnosis of Obstructive Sleep Apnea (OSA) and implications of untreated OSA. We discussed about CPAP and BiPaP as possible treatment options.    We will schedule the patient for a sleep study. Plan for a split night sleep study.  If mild, may consider holding off on cpap.  If ever, anticipate no issues with cpap.  Likely prefers nasal pillows.    Patient was instructed to call the office if he/she has not heard back from the office 1-2 weeks after the sleep study.   Patient was instructed to call the office if he/she is having issues with the PAP device.   We discussed good sleep hygiene.   Patient was advised not to engage in activities requiring concentration and/or vigilance if he/she is sleepy.  Patient was advised not to drive if he/she is sleepy.

## 2016-10-30 NOTE — Patient Instructions (Signed)
It was a pleasure taking care of you today!  We will schedule you to have a sleep study to determine if you have sleep apnea.     We will get a lab sleep study.  You will be scheduled to have a lab sleep study in 4-6 weeks.  Someone from the sleep lab will call you in 2-3 days to schedule the study with you.  They usually have cancellations every night so most likely, they will have openings for a lab sleep study next week or so.  We encourage you to do your sleep study then if possible. Please give us a call in a week is no one from the sleep lab calls you in 2-3 days.   If the sleep study is positive, we will order you a CPAP  machine.  Please call the office if you do NOT receive your machine in the next 1-2 weeks.   Please make sure you use your CPAP device everytime you sleep.  We will monitor the usage of your machine per your insurance requirement.  Your insurance company may take the machine from you if you are not using it regularly.   Please clean the mask, tubings, filter, water reservoir with soapy water every week.  Please use distilled water for the water reservoir.   Please call the office or your machine provider (DME company) if you are having issues with the device.   Return to clinic in 8-10 weeks with Dr. De Dios or NP    

## 2016-10-30 NOTE — Progress Notes (Signed)
Subjective:    Patient ID: Brittany Robertson, female    DOB: 05/09/1941, 76 y.o.   MRN: [id number]  HPI  Patient is being seen for dyspnea which might be related to obesity, restrictive ventilatory defect, diastolic dysfunction. She is being seen today because of concern for sleep apnea.  Pt is a non smoker, not known to have asthma or copd. Patient has snoring, occasional gasping and choking, has frequent awakenings at night.  Sleeps from 7-8 pm until 8 am.  Has always been a long sleeper. Wakes up refreshed in am usually but with episodes of hypersomnia.  (-) abnormal behavior in sleep. Hypersomnia affects her fxnality.   She does pulm rehab and SOB is better.   Not on O2.   ESS 6.   Review of Systems  Constitutional: Negative.  Negative for fever and unexpected weight change.  HENT: Negative.  Negative for congestion, dental problem, ear pain, nosebleeds, postnasal drip, rhinorrhea, sinus pressure, sneezing, sore throat and trouble swallowing.   Eyes: Negative.  Negative for redness and itching.  Respiratory: Positive for cough and shortness of breath. Negative for chest tightness and wheezing.   Cardiovascular: Negative.  Negative for palpitations and leg swelling.  Gastrointestinal: Negative.  Negative for nausea and vomiting.  Endocrine: Negative.   Genitourinary: Negative.  Negative for dysuria.  Musculoskeletal: Negative.  Negative for joint swelling.  Skin: Negative.  Negative for rash.  Allergic/Immunologic: Positive for environmental allergies.  Neurological: Positive for headaches.  Hematological: Negative.  Does not bruise/bleed easily.  Psychiatric/Behavioral: Negative.  Negative for dysphoric mood. The patient is not nervous/anxious.    Past Medical History:  Diagnosis Date  . Bruises easily   . Chronic diastolic CHF (congestive heart failure) ([place])   . Complication of anesthesia    "takes a long time towake up"  . Coronary artery disease    PCI 2006, repeat cath  30% mid LAD distal to stent otherwise normal coronary arteries, cath 08/2013 with patent LAD stent and otherwise normal coronary arteries with diastolic dysfunction  . DDD (degenerative disc disease), cervical   . Degenerative disk disease   . DOE (dyspnea on exertion)    chronic secondary to obesity, deconditiong and chronic diastolic CHF  . GERD (gastroesophageal reflux disease)   . Hyperlipidemia   . Hypertension   . OA (osteoarthritis)   . Obesity   . Osteopenia   . PONV (postoperative nausea and vomiting)    Motion sickness when move out of PACU to room  . PVC's (premature ventricular contractions)   . SOB (shortness of breath) 07/13/2016   (-) CA, DVT  Family History  Problem Relation Age of Onset  . Hypertension Mother   . Cancer Mother   . Hypertension Father   . Heart Problems Father      Past Surgical History:  Procedure Laterality Date  . ABDOMINAL HYSTERECTOMY    . APPENDECTOMY  1957  . arthroscopic knee  05/2002,04/07/2005   right and then also torn muscle surgery on right knee (2003(  . arthroscopic knee  01/02/2005,09/21/2006   left  . CARDIAC CATHETERIZATION N/A 08/31/2016   Procedure: Right/Left Heart Cath and Coronary Angiography;  Surgeon: Sherren Mocha, MD;  Location: [facility];  Service: Cardiovascular;  Laterality: N/A;  . CHOLECYSTECTOMY  1988  . COLONOSCOPY  2010   with Endo  . CORONARY ANGIOPLASTY  02/05/2004  . CYST REMOVAL HAND  2000  . [place] OF UTERUS  1972  . EYE SURGERY  01/2007   bilateral cataract with lens implant  . FOOT SURGERY  01/2012   left and right bunion and hammer toe  . HAND SURGERY  05/29/2011   joint removed  . [place]  . HERNIA REPAIR  02/2010   incisional hernioa  . KNEE ARTHROSCOPY    . LEFT HEART CATHETERIZATION WITH CORONARY ANGIOGRAM N/A 09/19/2013   Procedure: LEFT HEART CATHETERIZATION WITH CORONARY ANGIOGRAM;  Surgeon: Sueanne Margarita, MD;  Location: [facility];   Service: Cardiovascular;  Laterality: N/A;  . neck injections  03/2014   back and neck injections from Dr. Nelva Bush  . stents     x1  . tear duct surgery  1990's   bilateral  . TONSILLECTOMY     as child  . TOTAL KNEE ARTHROPLASTY Left 01/04/2015   Procedure: LEFT TOTAL KNEE ARTHROPLASTY;  Surgeon: Sydnee Cabal, MD;  Location: [facility];  Service: Orthopedics;  Laterality: Left;  . TOTAL KNEE ARTHROPLASTY Right 05/10/2015   Procedure: TOTAL RIGHT KNEE ARTHROPLASTY;  Surgeon: Sydnee Cabal, MD;  Location: [facility];  Service: Orthopedics;  Laterality: Right;    Social History   Social History  . Marital status: Married    Spouse name: N/A  . Number of children: N/A  . Years of education: N/A   Occupational History  . Not on file.   Social History Main Topics  . Smoking status: Never Smoker  . Smokeless tobacco: Never Used  . Alcohol use 0.6 oz/week    1 Glasses of wine per week     Comment: occassionally  . Drug use: No  . Sexual activity: Not on file   Other Topics Concern  . Not on file   Social History Narrative  . No narrative on file   Lives in [place]. Was a Art gallery manager. A glass of wine nightly.   Allergies  Allergen Reactions  . Atorvastatin Other (See Comments)    GENERIC causes severe skin rash but can take Brand Name  . Codeine Nausea And Vomiting  . Sulfa Antibiotics Hives and Itching  . Sulfamethoxazole Hives  . Sulfasalazine Itching and Hives  . Adhesive [Tape] Itching, Rash and Other (See Comments)    Skin irritation  . Other Rash    Surgical tape  . Penicillins Hives, Itching and Rash    Has patient had a PCN reaction causing immediate rash, facial/tongue/throat swelling, SOB or lightheadedness with hypotension: Yes Has patient had a PCN reaction causing severe rash involving mucus membranes or skin necrosis: Yes Has patient had a PCN reaction that required hospitalization No Has patient had a PCN reaction occurring within the last 10 years:  No If all of the above answers are "NO", then may proceed with Cephalosporin use.      Outpatient Medications Prior to Visit  Medication Sig Dispense Refill  . acetaminophen (TYLENOL) 500 MG tablet Take 500-1,000 mg by mouth daily as needed for headache.    Marland Kitchen aspirin 81 MG tablet Take 81 mg by mouth every evening.     Marland Kitchen atorvastatin (LIPITOR) 20 MG tablet Take 20 mg by mouth at bedtime. Brand Name Only    . Calcium Carbonate-Vitamin D (CALCIUM + D PO) Take 1 tablet by mouth every other day.     . cetirizine (ZYRTEC) 10 MG tablet Take 10 mg by mouth every morning.      . cholecalciferol (VITAMIN D) 400 UNITS TABS Take 400 Units by mouth every other day.     Marland Kitchen  Coenzyme Q10 Liposomal 100 MG/ML LIQD Take 100 mg by mouth every evening.    . denosumab (PROLIA) 60 MG/ML SOLN injection Inject 60 mg into the skin every 6 (six) months. Administer in upper arm, thigh, or abdomen    . esomeprazole (NEXIUM) 40 MG capsule Take 40 mg by mouth daily before breakfast.      . ezetimibe (ZETIA) 10 MG tablet Take 1 tablet (10 mg total) by mouth every evening. 90 tablet 3  . fish oil-omega-3 fatty acids 1000 MG capsule Take 1 g by mouth 2 (two) times daily.      . furosemide (LASIX) 20 MG tablet Take 20 mg by mouth daily as needed for fluid or edema.     . meloxicam (MOBIC) 15 MG tablet Take 15 mg by mouth every evening.      . metoprolol succinate (TOPROL-XL) 25 MG 24 hr tablet Take 1 tablet by mouth  daily 90 tablet 0  . mirabegron ER (MYRBETRIQ) 25 MG TB24 tablet Take 25 mg by mouth every evening.    . nitroGLYCERIN (NITROSTAT) 0.4 MG SL tablet Place 1 tablet (0.4 mg total) under the tongue every 5 (five) minutes as needed. As needed for chest pain x 3 doses 25 tablet 1  . olmesartan (BENICAR) 40 MG tablet Take 1 tablet (40 mg total) by mouth daily. 90 tablet 3  . Polyethyl Glycol-Propyl Glycol (SYSTANE ULTRA OP) Apply 1 drop to eye 2 (two) times daily as needed (dry eyes).    . potassium chloride SA  (K-DUR,KLOR-CON) 20 MEQ tablet Take 1 tablet by mouth  every morning 90 tablet 0  . vitamin C (ASCORBIC ACID) 500 MG tablet Take 500 mg by mouth 2 (two) times daily.     . vitamin E 400 UNIT capsule Take 400 Units by mouth at bedtime.     . docusate sodium (COLACE) 100 MG capsule Take 100 mg by mouth 2 (two) times daily.     . methocarbamol (ROBAXIN) 500 MG tablet Take 500 mg by mouth 4 (four) times daily as needed for muscle spasms.     No facility-administered medications prior to visit.    No orders of the defined types were placed in this encounter.       Objective:   Physical Exam  Vitals:  Vitals:   10/30/16 0901  BP: 108/76  Pulse: (!) 53  SpO2: 99%  Weight: 215 lb 6.4 oz (97.7 kg)  Height: 5\' 4"  (1.626 m)    Constitutional/General:  Pleasant, well-nourished, well-developed, not in any distress,  Comfortably seating.  Well kempt  Body mass index is 36.97 kg/m. Wt Readings from Last 3 Encounters:  10/30/16 215 lb 6.4 oz (97.7 kg)  10/15/16 225 lb 8.5 oz (102.3 kg)  10/13/16 225 lb 12 oz (102.4 kg)     HEENT: Pupils equal and reactive to light and accommodation. Anicteric sclerae. Normal nasal mucosa.   No oral  lesions,  mouth clear,  oropharynx clear, no postnasal drip. (-) Oral thrush. No dental caries.  Airway - Mallampati class III  Neck: No masses. Midline trachea. No JVD, (-) LAD. (-) bruits appreciated.  Respiratory/Chest: Grossly normal chest. (-) deformity. (-) Accessory muscle use.  Symmetric expansion. (-) Tenderness on palpation.  Resonant on percussion.  Diminished BS on both lower lung zones. (-) wheezing, crackles, rhonchi (-) egophony  Cardiovascular: Regular rate and  rhythm, heart sounds normal, no murmur or gallops, no peripheral edema  Gastrointestinal:  Normal bowel sounds. Soft, non-tender. No hepatosplenomegaly.  (-)  masses.   Musculoskeletal:  Normal muscle tone. Normal gait.   Extremities: Grossly normal. (-) clubbing,  cyanosis.  (-) edema  Skin: (-) rash,lesions seen.   Neurological/Psychiatric : alert, oriented to time, place, person. Normal mood and affect          Assessment & Plan:  Hypersomnia Patient has snoring, occasional gasping and choking, has frequent awakenings at night.  Sleeps from 7-8 pm until 8 am.  Has always been a long sleeper. Wakes up refreshed in am usually but with episodes of hypersomnia.  (-) abnormal behavior in sleep. Hypersomnia affects her fxnality.   She does pulm rehab and SOB is better.   Not on O2.   ESS 6.   Plan :  We discussed about the diagnosis of Obstructive Sleep Apnea (OSA) and implications of untreated OSA. We discussed about CPAP and BiPaP as possible treatment options.    We will schedule the patient for a sleep study. Plan for a split night sleep study.  If mild, may consider holding off on cpap.  If ever, anticipate no issues with cpap.  Likely prefers nasal pillows.    Patient was instructed to call the office if he/she has not heard back from the office 1-2 weeks after the sleep study.   Patient was instructed to call the office if he/she is having issues with the PAP device.   We discussed good sleep hygiene.   Patient was advised not to engage in activities requiring concentration and/or vigilance if he/she is sleepy.  Patient was advised not to drive if he/she is sleepy.   Exertional dyspnea Likely related to obesity, restrictive ventilatory defect, diastolic dysfunction. Currently on pulmonary rehabilitation. Feels better overall.  Obesity Weight reduction      Patient will follow up with me in 8-10 weeks    J. Shirl Harris, MD 10/30/2016   9:37 AM Pulmonary and [place] Pager: [phone] Office: [phone], Fax: [phone]

## 2016-11-03 ENCOUNTER — Encounter (HOSPITAL_COMMUNITY)
Admission: RE | Admit: 2016-11-03 | Discharge: 2016-11-03 | Disposition: A | Discharge status: Home / Self Care | Payer: Medicare Other | Source: Ambulatory Visit | Attending: Internal Medicine | Admitting: Internal Medicine

## 2016-11-03 VITALS — Wt 216.5 lb

## 2016-11-03 DIAGNOSIS — R0602 Shortness of breath: Secondary | ICD-10-CM | POA: Insufficient documentation

## 2016-11-03 NOTE — Progress Notes (Signed)
Daily Session Note  Patient Details  Name: Brittany Robertson MRN: [id number] Date of Birth: June 25, 1941 Referring Provider:   April Manson Pulmonary Rehab Walk Test from 09/08/2016 in [place]  Referring Provider  dr. Chase Caller      Encounter Date: 11/03/2016  Check In:     Session Check In - 11/03/16 1028      Check-In   Location [facility]   Staff Present Rosebud Poles, RN, BSN;Molly diVincenzo, MS, ACSM RCEP, Exercise Physiologist;Lisa Ysidro Evert, RN;Portia Rollene Rotunda, RN, BSN   Supervising physician immediately available to respond to emergencies [facility] immediately available   Physician(s) Dr. Eliseo Squires   Medication changes reported     No   Fall or balance concerns reported    No   Warm-up and Cool-down Performed as group-led instruction   Resistance Training Performed Yes   VAD Patient? No     Pain Assessment   Currently in Pain? No/denies   Multiple Pain Sites No      Capillary Blood Glucose: No results found for this or any previous visit (from the past 24 hour(s)).      Exercise Prescription Changes - 11/03/16 1200      Response to Exercise   Blood Pressure (Admit) 118/68   Blood Pressure (Exercise) 100/60   Blood Pressure (Exit) 108/64   Heart Rate (Admit) 59 bpm   Heart Rate (Exercise) 89 bpm   Heart Rate (Exit) 67 bpm   Oxygen Saturation (Admit) 99 %   Oxygen Saturation (Exercise) 100 %   Oxygen Saturation (Exit) 95 %   Rating of Perceived Exertion (Exercise) 12   Perceived Dyspnea (Exercise) 2   Duration Progress to 45 minutes of aerobic exercise without signs/symptoms of physical distress   Intensity THRR unchanged     Progression   Progression Continue to progress workloads to maintain intensity without signs/symptoms of physical distress.     Resistance Training   Training Prescription Yes   Weight orange bands   Reps 10-12  10 minutes of strength training     Interval Training   Interval  Training No     Recumbant Bike   Level 3   Minutes 17     NuStep   Level 3   Minutes 17   METs 2.1     Track   Laps 12   Minutes 17     Goals Met:  Improved SOB with ADL's Using PLB without cueing & demonstrates good technique Achieving weight loss Exercise tolerated well Strength training completed today  Goals Unmet:  Not Applicable  Comments: Service time is from 1030 to 1200    Dr. Rush Farmer is Medical Director for Pulmonary Rehab at [facility].

## 2016-11-05 ENCOUNTER — Encounter (HOSPITAL_COMMUNITY): Admission: RE | Admit: 2016-11-05 | Payer: Medicare Other | Source: Ambulatory Visit

## 2016-11-10 ENCOUNTER — Encounter (HOSPITAL_COMMUNITY)
Admission: RE | Admit: 2016-11-10 | Discharge: 2016-11-10 | Disposition: A | Discharge status: Home / Self Care | Payer: Medicare Other | Source: Ambulatory Visit | Attending: Internal Medicine | Admitting: Internal Medicine

## 2016-11-10 VITALS — Wt 215.4 lb

## 2016-11-10 DIAGNOSIS — R0602 Shortness of breath: Secondary | ICD-10-CM

## 2016-11-10 NOTE — Progress Notes (Signed)
Daily Session Note  Patient Details  Name: Brittany Robertson MRN: [id number] Date of Birth: 05/20/1941 Referring Provider:   Flowsheet Row Pulmonary Rehab Walk Test from 09/08/2016 in [facility]  Referring Provider  dr. ramaswamy      Encounter Date: 11/10/2016  Check In:     Session Check In - 11/10/16 1026      Check-In   Location [facility]   Staff Present Joan Behrens, RN, BSN;Molly diVincenzo, MS, ACSM RCEP, Exercise Physiologist;Lisa Hughes, RN; , RN, BSN   Supervising physician immediately available to respond to emergencies [facility] immediately available   Physician(s) Dr. Chiu   Medication changes reported     No   Fall or balance concerns reported    No   Warm-up and Cool-down Performed as group-led instruction   Resistance Training Performed Yes   VAD Patient? No     Pain Assessment   Currently in Pain? No/denies   Multiple Pain Sites No      Capillary Blood Glucose: No results found for this or any previous visit (from the past 24 hour(s)).      Exercise Prescription Changes - 11/10/16 1205      Exercise Review   Progression Yes     Response to Exercise   Blood Pressure (Admit) 114/74   Blood Pressure (Exercise) 132/88   Blood Pressure (Exit) 110/60   Heart Rate (Admit) 59 bpm   Heart Rate (Exercise) 94 bpm   Heart Rate (Exit) 79 bpm   Oxygen Saturation (Admit) 100 %   Oxygen Saturation (Exercise) 97 %   Oxygen Saturation (Exit) 97 %   Rating of Perceived Exertion (Exercise) 17   Perceived Dyspnea (Exercise) 3   Duration Progress to 45 minutes of aerobic exercise without signs/symptoms of physical distress   Intensity THRR unchanged     Progression   Progression Continue to progress workloads to maintain intensity without signs/symptoms of physical distress.     Resistance Training   Training Prescription Yes   Weight orange bands   Reps 10-12  10 minutes of strength  training     Interval Training   Interval Training No     Recumbant Bike   Level 4   Minutes 17     NuStep   Level 3   Minutes 17   METs 2.2     Track   Laps 11   Minutes 17     Goals Met:  Independence with exercise equipment Improved SOB with ADL's Using PLB without cueing & demonstrates good technique Exercise tolerated well No report of cardiac concerns or symptoms Strength training completed today  Goals Unmet:  Not Applicable  Comments: Service time is from 1030 to 1200   Dr. Wesam G. Yacoub is Medical Director for Pulmonary Rehab at [facility]. 

## 2016-11-10 NOTE — Progress Notes (Signed)
Pulmonary Individual Treatment Plan  Patient Details  Name: Brittany Robertson MRN: [id number] Date of Birth: 1941-05-02 Referring Provider:   April Manson Pulmonary Rehab Walk Test from 09/08/2016 in [facility]  Referring Provider  dr. Chase Caller      Initial Encounter Date:  Flowsheet Row Pulmonary Rehab Walk Test from 09/08/2016 in [place]  Date  09/08/16  Referring Provider  dr. Chase Caller      Visit Diagnosis: SOB (shortness of breath)  Patient's Home Medications on Admission:   Current Outpatient Prescriptions:  .  acetaminophen (TYLENOL) 500 MG tablet, Take 500-1,000 mg by mouth daily as needed for headache., Disp: , Rfl:  .  aspirin 81 MG tablet, Take 81 mg by mouth every evening. , Disp: , Rfl:  .  atorvastatin (LIPITOR) 20 MG tablet, Take 20 mg by mouth at bedtime. Brand Name Only, Disp: , Rfl:  .  Calcium Carbonate-Vitamin D (CALCIUM + D PO), Take 1 tablet by mouth every other day. , Disp: , Rfl:  .  cetirizine (ZYRTEC) 10 MG tablet, Take 10 mg by mouth every morning.  , Disp: , Rfl:  .  cholecalciferol (VITAMIN D) 400 UNITS TABS, Take 400 Units by mouth every other day. , Disp: , Rfl:  .  Coenzyme Q10 Liposomal 100 MG/ML LIQD, Take 100 mg by mouth every evening., Disp: , Rfl:  .  denosumab (PROLIA) 60 MG/ML SOLN injection, Inject 60 mg into the skin every 6 (six) months. Administer in upper arm, thigh, or abdomen, Disp: , Rfl:  .  docusate sodium (COLACE) 100 MG capsule, Take 100 mg by mouth 2 (two) times daily. , Disp: , Rfl:  .  esomeprazole (NEXIUM) 40 MG capsule, Take 40 mg by mouth daily before breakfast.  , Disp: , Rfl:  .  ezetimibe (ZETIA) 10 MG tablet, Take 1 tablet (10 mg total) by mouth every evening., Disp: 90 tablet, Rfl: 3 .  fish oil-omega-3 fatty acids 1000 MG capsule, Take 1 g by mouth 2 (two) times daily.  , Disp: , Rfl:  .  furosemide (LASIX) 20 MG tablet, Take 20 mg by mouth daily as  needed for fluid or edema. , Disp: , Rfl:  .  meloxicam (MOBIC) 15 MG tablet, Take 15 mg by mouth every evening.  , Disp: , Rfl:  .  methocarbamol (ROBAXIN) 500 MG tablet, Take 500 mg by mouth 4 (four) times daily as needed for muscle spasms., Disp: , Rfl:  .  metoprolol succinate (TOPROL-XL) 25 MG 24 hr tablet, Take 1 tablet by mouth  daily, Disp: 90 tablet, Rfl: 0 .  mirabegron ER (MYRBETRIQ) 25 MG TB24 tablet, Take 25 mg by mouth every evening., Disp: , Rfl:  .  nitroGLYCERIN (NITROSTAT) 0.4 MG SL tablet, Place 1 tablet (0.4 mg total) under the tongue every 5 (five) minutes as needed. As needed for chest pain x 3 doses, Disp: 25 tablet, Rfl: 1 .  olmesartan (BENICAR) 40 MG tablet, Take 1 tablet (40 mg total) by mouth daily., Disp: 90 tablet, Rfl: 3 .  Polyethyl Glycol-Propyl Glycol (SYSTANE ULTRA OP), Apply 1 drop to eye 2 (two) times daily as needed (dry eyes)., Disp: , Rfl:  .  potassium chloride SA (K-DUR,KLOR-CON) 20 MEQ tablet, Take 1 tablet by mouth  every morning, Disp: 90 tablet, Rfl: 0 .  vitamin C (ASCORBIC ACID) 500 MG tablet, Take 500 mg by mouth 2 (two) times daily. , Disp: , Rfl:  .  vitamin E 400 UNIT capsule, Take 400 Units by mouth at bedtime. , Disp: , Rfl:   Past Medical History: Past Medical History:  Diagnosis Date  . Bruises easily   . Chronic diastolic CHF (congestive heart failure) ([place])   . Complication of anesthesia    "takes a long time towake up"  . Coronary artery disease    PCI 2006, repeat cath 30% mid LAD distal to stent otherwise normal coronary arteries, cath 08/2013 with patent LAD stent and otherwise normal coronary arteries with diastolic dysfunction  . DDD (degenerative disc disease), cervical   . Degenerative disk disease   . DOE (dyspnea on exertion)    chronic secondary to obesity, deconditiong and chronic diastolic CHF  . GERD (gastroesophageal reflux disease)   . Hyperlipidemia   . Hypertension   . OA (osteoarthritis)   . Obesity   .  Osteopenia   . PONV (postoperative nausea and vomiting)    Motion sickness when move out of PACU to room  . PVC's (premature ventricular contractions)   . SOB (shortness of breath) 07/13/2016    Tobacco Use: History  Smoking Status  . Never Smoker  Smokeless Tobacco  . Never Used    Labs: Recent Review Flowsheet Data    Labs for ITP Cardiac and Pulmonary Rehab Latest Ref Rng & Units 12/07/2014 04/24/2016 08/31/2016 08/31/2016 08/31/2016   Cholestrol 125 - 200 mg/dL 119 120(L) - - -   LDLCALC <130 mg/dL 56 36 - - -   HDL >=46 mg/dL 41.10 58 - - -   Trlycerides <150 mg/dL 109.0 130 - - -   PHART 7.350 - 7.450 - - 7.412 - -   PCO2ART 32.0 - 48.0 mmHg - - 36.0 - -   HCO3 20.0 - 28.0 mmol/L - - 22.9 23.3 22.2   TCO2 0 - 100 mmol/L - - _0 ACIDBASEDEF 0.0 - 2.0 mmol/L - - 1.0 1.0 3.0(H)   O2SAT % - - 93.0 68.0 65.0      Capillary Blood Glucose: No results found for: GLUCAP   ADL UCSD:   Pulmonary Function Assessment:     Pulmonary Function Assessment - 09/07/16 0943      Breath   Bilateral Breath Sounds Clear   Shortness of Breath Yes;Limiting activity      Exercise Target Goals:    Exercise Program Goal: Individual exercise prescription set with THRR, safety & activity barriers. Participant demonstrates ability to understand and report RPE using BORG scale, to self-measure pulse accurately, and to acknowledge the importance of the exercise prescription.  Exercise Prescription Goal: Starting with aerobic activity 30 plus minutes a day, 3 days per week for initial exercise prescription. Provide home exercise prescription and guidelines that participant acknowledges understanding prior to discharge.  Activity Barriers & Risk Stratification:     Activity Barriers & Cardiac Risk Stratification - 09/07/16 0939      Activity Barriers & Cardiac Risk Stratification   Activity Barriers Right Knee Replacement;Left Knee Replacement;Arthritis;Back Problems;Shortness  of Breath  degenerative disc disease      6 Minute Walk:     6 Minute Walk    Row Name 09/08/16 1633         6 Minute Walk   Phase Initial     Distance 1020 feet     Walk Time 6 minutes     # of Rest Breaks 0     MPH 1.93     METS 2.45  RPE 14     Perceived Dyspnea  1     Symptoms No     Resting HR 66 bpm     Resting BP 119/80     Max Ex. HR 102 bpm     Max Ex. BP 125/76       Interval HR   Baseline HR 66     1 Minute HR 81     2 Minute HR 94     3 Minute HR 99     4 Minute HR 101     5 Minute HR 98     6 Minute HR 102     2 Minute Post HR 68     Interval Heart Rate? Yes       Interval Oxygen   Interval Oxygen? Yes     Baseline Oxygen Saturation % 97 %     Baseline Liters of Oxygen 0 L     1 Minute Oxygen Saturation % 96 %     1 Minute Liters of Oxygen 0 L     2 Minute Oxygen Saturation % 99 %     2 Minute Liters of Oxygen 0 L     3 Minute Oxygen Saturation % 96 %     3 Minute Liters of Oxygen 0 L     4 Minute Oxygen Saturation % 91 %     4 Minute Liters of Oxygen 0 L     5 Minute Oxygen Saturation % 90 %     5 Minute Liters of Oxygen 0 L     6 Minute Oxygen Saturation % 90 %     6 Minute Liters of Oxygen 0 L     2 Minute Post Oxygen Saturation % 99 %     2 Minute Post Liters of Oxygen 0 L        Initial Exercise Prescription:     Initial Exercise Prescription - 09/08/16 1600      Date of Initial Exercise RX and Referring Provider   Date 09/08/16   Referring Provider dr. Chase Caller     NuStep   Level 2   Minutes 17   METs 1.5     Arm Ergometer   Level 2   Minutes 17     Track   Laps 5   Minutes 17     Prescription Details   Frequency (times per week) 2   Duration Progress to 45 minutes of aerobic exercise without signs/symptoms of physical distress     Intensity   THRR 40-80% of Max Heartrate 57-114   Ratings of Perceived Exertion 11-13   Perceived Dyspnea 0-4     Progression   Progression Continue progressive overload as  per policy without signs/symptoms or physical distress.     Resistance Training   Training Prescription Yes   Weight orange bands   Reps 10-12      Perform Capillary Blood Glucose checks as needed.  Exercise Prescription Changes:     Exercise Prescription Changes    Row Name 09/29/16 1200 10/01/16 1200 10/06/16 1200 10/08/16 1200 10/13/16 1227     Exercise Review   Progression  -  - Yes Yes  -     Response to Exercise   Blood Pressure (Admit) 120/70 168/104 130/80 106/60 112/60   Blood Pressure (Exercise) 120/80 142/80 120/80 114/70 112/66   Blood Pressure (Exit) 110/79 104/72 110/66 104/60 100/62   Heart Rate (Admit) 58 bpm 60 bpm 59 bpm 56 bpm  59 bpm   Heart Rate (Exercise) 82 bpm 85 bpm 83 bpm 97 bpm 84 bpm   Heart Rate (Exit) 75 bpm 72 bpm 70 bpm 71 bpm 69 bpm   Oxygen Saturation (Admit) 96 % 98 % 100 % 100 % 100 %   Oxygen Saturation (Exercise) 100 % 92 % 96 % 94 % 94 %   Oxygen Saturation (Exit) 95 % 98 % 97 % 98 % 96 %   Rating of Perceived Exertion (Exercise) '13 13 13 13 13   '$ Perceived Dyspnea (Exercise) '2 3 2 2 2   '$ Duration Progress to 45 minutes of aerobic exercise without signs/symptoms of physical distress Progress to 45 minutes of aerobic exercise without signs/symptoms of physical distress Progress to 45 minutes of aerobic exercise without signs/symptoms of physical distress Progress to 45 minutes of aerobic exercise without signs/symptoms of physical distress Progress to 45 minutes of aerobic exercise without signs/symptoms of physical distress   Intensity -  40-80% of HRR -  40-80% of HRR -  40-80% of HRR THRR unchanged THRR unchanged     Progression   Progression Continue to progress workloads to maintain intensity without signs/symptoms of physical distress. Continue to progress workloads to maintain intensity without signs/symptoms of physical distress. Continue to progress workloads to maintain intensity without signs/symptoms of physical distress. Continue  to progress workloads to maintain intensity without signs/symptoms of physical distress. Continue to progress workloads to maintain intensity without signs/symptoms of physical distress.     Resistance Training   Training Prescription Yes Yes Yes Yes Yes   Weight orange bands orange bands orange bands orange bands orange bands   Reps 10-12  10 minutes of strength training 10-12  10 minutes of strength training 10-12  10 minutes of strength training 10-12  10 minutes of strength training 10-12  10 minutes of strength training     Recumbant Bike   Level  -  - 2 3 2.5  decreased workload r/t sore legs   Minutes  -  - '17 17 17     '$ NuStep   Level '2 2 3  '$ - 3   Minutes '17 17 17  '$ - 17   METs 1.8 1.8 1.8  - 1.7     Arm Ergometer   Level 1  Workload redued due to pt c/o of shoulder pain -  Workload redued due to pt c/o of shoulder pain -  Workload redued due to pt c/o of shoulder pain  -  -   Minutes 17  -  -  -  -     Track   Laps '12 12 14 14 14   '$ Minutes '17 17 17 17 17   '$ [place] Name 10/15/16 1200 11/03/16 1200           Response to Exercise   Blood Pressure (Admit) 136/74 118/68      Blood Pressure (Exercise) 110/64 100/60      Blood Pressure (Exit) 122/80 108/64      Heart Rate (Admit) 57 bpm 59 bpm      Heart Rate (Exercise) 88 bpm 89 bpm      Heart Rate (Exit) 73 bpm 67 bpm      Oxygen Saturation (Admit) 96 % 99 %      Oxygen Saturation (Exercise) 96 % 100 %      Oxygen Saturation (Exit) 99 % 95 %      Rating of Perceived Exertion (Exercise) 13 12      Perceived  Dyspnea (Exercise) 2 2      Duration Progress to 45 minutes of aerobic exercise without signs/symptoms of physical distress Progress to 45 minutes of aerobic exercise without signs/symptoms of physical distress      Intensity THRR unchanged THRR unchanged        Progression   Progression Continue to progress workloads to maintain intensity without signs/symptoms of physical distress. Continue to progress workloads  to maintain intensity without signs/symptoms of physical distress.        Resistance Training   Training Prescription Yes Yes      Weight orange bands orange bands      Reps 10-12  10 minutes of strength training 10-12  10 minutes of strength training        Interval Training   Interval Training  - No        Recumbant Bike   Level  - 3      Minutes  - 17        NuStep   Level 3 3      Minutes 17 17      METs 2.1 2.1        Track   Laps 15 12      Minutes 17 17         Exercise Comments:     Exercise Comments    Row Name 10/06/16 0849 11/09/16 1645         Exercise Comments Patient has only attended two sessions. Will cont. to monitor.  Patient has had extended leave of absence due to sickness. She has just returned to rehab. Will cont. to monitor and progress.          Discharge Exercise Prescription (Final Exercise Prescription Changes):     Exercise Prescription Changes - 11/03/16 1200      Response to Exercise   Blood Pressure (Admit) 118/68   Blood Pressure (Exercise) 100/60   Blood Pressure (Exit) 108/64   Heart Rate (Admit) 59 bpm   Heart Rate (Exercise) 89 bpm   Heart Rate (Exit) 67 bpm   Oxygen Saturation (Admit) 99 %   Oxygen Saturation (Exercise) 100 %   Oxygen Saturation (Exit) 95 %   Rating of Perceived Exertion (Exercise) 12   Perceived Dyspnea (Exercise) 2   Duration Progress to 45 minutes of aerobic exercise without signs/symptoms of physical distress   Intensity THRR unchanged     Progression   Progression Continue to progress workloads to maintain intensity without signs/symptoms of physical distress.     Resistance Training   Training Prescription Yes   Weight orange bands   Reps 10-12  10 minutes of strength training     Interval Training   Interval Training No     Recumbant Bike   Level 3   Minutes 17     NuStep   Level 3   Minutes 17   METs 2.1     Track   Laps 12   Minutes 17      Nutrition:  Target Goals:  Understanding of nutrition guidelines, daily intake of sodium '1500mg'$ , cholesterol '200mg'$ , calories 30% from fat and 7% or less from saturated fats, daily to have 5 or more servings of fruits and vegetables.  Biometrics:     Pre Biometrics - 09/07/16 0944      Pre Biometrics   Grip Strength 24 kg       Nutrition Therapy Plan and Nutrition Goals:   Nutrition Discharge: Rate Your Plate Scores:  Nutrition Assessments - 10/01/16 1141      Rate Your Plate Scores   Pre Score 58      Psychosocial: Target Goals: Acknowledge presence or absence of depression, maximize coping skills, provide positive support system. Participant is able to verbalize types and ability to use techniques and skills needed for reducing stress and depression.  Initial Review & Psychosocial Screening:     Initial Psych Review & Screening - 09/07/16 [place]? Yes     Barriers   Psychosocial barriers to participate in program There are no identifiable barriers or psychosocial needs.     Screening Interventions   Interventions Encouraged to exercise      Quality of Life Scores:   PHQ-9: Recent Review Flowsheet Data    Depression screen [facility] 2/9 09/07/2016   Decreased Interest 0   Down, Depressed, Hopeless 0   PHQ - 2 Score 0      Psychosocial Evaluation and Intervention:     Psychosocial Evaluation - 10/13/16 0831      Psychosocial Evaluation & Interventions   Interventions Encouraged to exercise with the program and follow exercise prescription      Psychosocial Re-Evaluation:     Psychosocial Re-Evaluation    [place] Name 10/13/16 0831 11/10/16 0753           Psychosocial Re-Evaluation   Interventions Encouraged to attend Pulmonary Rehabilitation for the exercise Encouraged to attend Pulmonary Rehabilitation for the exercise      Comments no psychosocial barriers identified over the past 30 days no psychosocial barriers identified over  the past 30 days         Education: Education Goals: Education classes will be provided on a weekly basis, covering required topics. Participant will state understanding/return demonstration of topics presented.  Learning Barriers/Preferences:     Learning Barriers/Preferences - 09/07/16 0941      Learning Barriers/Preferences   Learning Barriers None   Learning Preferences Written Material;Video;Verbal Instruction;Skilled Demonstration;Pictoral;Individual Instruction;Group Instruction;Audio      Education Topics: How Lungs Work and Diseases: - Discuss the anatomy of the lungs and diseases that can affect the lungs, such as COPD.   Exercise: -Discuss the importance of exercise, FITT principles of exercise, normal and abnormal responses to exercise, and how to exercise safely.   Environmental Irritants: -Discuss types of environmental irritants and how to limit exposure to environmental irritants.   Meds/Inhalers and oxygen: - Discuss respiratory medications, definition of an inhaler and oxygen, and the proper way to use an inhaler and oxygen.   Energy Saving Techniques: - Discuss methods to conserve energy and decrease shortness of breath when performing activities of daily living.    Bronchial Hygiene / Breathing Techniques: - Discuss breathing mechanics, pursed-lip breathing technique,  proper posture, effective ways to clear airways, and other functional breathing techniques   Cleaning Equipment: - Provides group verbal and written instruction about the health risks of elevated stress, cause of high stress, and healthy ways to reduce stress.   Nutrition I: Fats: - Discuss the types of cholesterol, what cholesterol does to the body, and how cholesterol levels can be controlled.   Nutrition II: Labels: -Discuss the different components of food labels and how to read food labels.   Respiratory Infections: - Discuss the signs and symptoms of respiratory  infections, ways to prevent respiratory infections, and the importance of seeking medical treatment when having a respiratory infection.   Stress I: Signs and Symptoms: -  Discuss the causes of stress, how stress may lead to anxiety and depression, and ways to limit stress.   Stress II: Relaxation: -Discuss relaxation techniques to limit stress.   Oxygen for Home/Travel: - Discuss how to prepare for travel when on oxygen and proper ways to transport and store oxygen to ensure safety.   Knowledge Questionnaire Score:   Core Components/Risk Factors/Patient Goals at Admission:     Personal Goals and Risk Factors at Admission - 09/07/16 0945      Core Components/Risk Factors/Patient Goals on Admission   Increase Strength and Stamina Yes   Improve shortness of breath with ADL's Yes      Core Components/Risk Factors/Patient Goals Review:      Goals and Risk Factor Review    Row Name 09/07/16 0946 10/13/16 0830 11/10/16 0753         Core Components/Risk Factors/Patient Goals Review   Personal Goals Review Increase Strength and Stamina;Improve shortness of breath with ADL's Increase Strength and Stamina;Improve shortness of breath with ADL's Increase Strength and Stamina;Improve shortness of breath with ADL's     Review Has been sedentary following 2 knee replacements and feels that her shortness of breath is from her knees limiting her activity see comments section on ITP see comments section on ITP     Expected Outcomes  - see expected outcomes on Admission documentation see expected outcomes on Admission documentation        Core Components/Risk Factors/Patient Goals at Discharge (Final Review):      Goals and Risk Factor Review - 11/10/16 0753      Core Components/Risk Factors/Patient Goals Review   Personal Goals Review Increase Strength and Stamina;Improve shortness of breath with ADL's   Review see comments section on ITP   Expected Outcomes see expected outcomes on  Admission documentation      ITP Comments:   Comments: ITP REVIEW Pt is making expected progress toward pulmonary rehab goals after completing 7 sessions. Recommend continued exercise, life style modification, education, and utilization of breathing techniques to increase stamina and strength and decrease shortness of breath with exertion.

## 2016-11-12 ENCOUNTER — Encounter (HOSPITAL_COMMUNITY)
Admission: RE | Admit: 2016-11-12 | Discharge: 2016-11-12 | Disposition: A | Discharge status: Home / Self Care | Payer: Medicare Other | Source: Ambulatory Visit | Attending: Internal Medicine | Admitting: Internal Medicine

## 2016-11-12 VITALS — Wt 215.8 lb

## 2016-11-12 DIAGNOSIS — R0602 Shortness of breath: Secondary | ICD-10-CM | POA: Diagnosis not present

## 2016-11-12 NOTE — Progress Notes (Signed)
Daily Session Note  Patient Details  Name: Brittany Robertson MRN: [id number] Date of Birth: 20-Apr-1941 Referring Provider:   April Manson Pulmonary Rehab Walk Test from 09/08/2016 in [place]  Referring Provider  dr. Chase Caller      Encounter Date: 11/12/2016  Check In:     Session Check In - 11/12/16 1030      Check-In   Location [facility]   Staff Present Trish Fountain, RN, BSN;Molly diVincenzo, MS, ACSM RCEP, Exercise Physiologist;Brunetta Newingham Ysidro Evert, Felipe Drone, RN, [facility]   Supervising physician immediately available to respond to emergencies [facility] immediately available   Physician(s) Dr. Algis Liming   Medication changes reported     No   Fall or balance concerns reported    No   Warm-up and Cool-down Performed as group-led instruction   Resistance Training Performed Yes   VAD Patient? No     Pain Assessment   Currently in Pain? No/denies   Multiple Pain Sites No      Capillary Blood Glucose: No results found for this or any previous visit (from the past 24 hour(s)).      Exercise Prescription Changes - 11/12/16 1200      Exercise Review   Progression Yes     Response to Exercise   Blood Pressure (Admit) 112/70   Blood Pressure (Exercise) 106/60   Blood Pressure (Exit) 114/61   Heart Rate (Admit) 66 bpm   Heart Rate (Exercise) 104 bpm   Heart Rate (Exit) 74 bpm   Oxygen Saturation (Admit) 98 %   Oxygen Saturation (Exercise) 99 %   Oxygen Saturation (Exit) 95 %   Rating of Perceived Exertion (Exercise) 13   Perceived Dyspnea (Exercise) 2.5   Duration Progress to 45 minutes of aerobic exercise without signs/symptoms of physical distress   Intensity THRR unchanged     Progression   Progression Continue to progress workloads to maintain intensity without signs/symptoms of physical distress.     Resistance Training   Training Prescription Yes   Weight orange bands   Reps 10-12  10 minutes of  strength training     Interval Training   Interval Training No     NuStep   Level 4   Minutes 17   METs 2.2     Track   Laps 15   Minutes 17     Goals Met:  Exercise tolerated well No report of cardiac concerns or symptoms Strength training completed today  Goals Unmet:  Not Applicable  Comments: Service time is from 1030 to 1205    Dr. Rush Farmer is Medical Director for Pulmonary Rehab at [facility].

## 2016-11-17 ENCOUNTER — Encounter (HOSPITAL_COMMUNITY)
Admission: RE | Admit: 2016-11-17 | Discharge: 2016-11-17 | Disposition: A | Discharge status: Home / Self Care | Payer: Medicare Other | Source: Ambulatory Visit | Attending: Internal Medicine | Admitting: Internal Medicine

## 2016-11-17 VITALS — Wt 216.1 lb

## 2016-11-17 DIAGNOSIS — R0602 Shortness of breath: Secondary | ICD-10-CM

## 2016-11-17 NOTE — Progress Notes (Signed)
I have reviewed a Home Exercise Prescription with [name] . Brittany Robertson is currently exercising at home.  The patient was advised to walk 2-3 days a week for 30 minutes.  Sanda and I discussed how to progress their exercise prescription.  The patient stated that their goals were to decrease shortness of breath, lose weight, and decrease sweet intake.  The patient stated that they understand the exercise prescription.  We reviewed exercise guidelines, target heart rate during exercise, oxygen use, weather, home pulse oximeter, endpoints for exercise, and goals.  Patient is encouraged to come to me with any questions. I will continue to follow up with the patient to assist them with progression and safety.

## 2016-11-17 NOTE — Progress Notes (Signed)
Daily Session Note  Patient Details  Name: Brittany Robertson MRN: [id number] Date of Birth: [birth year] Referring Provider:   April Manson Pulmonary Rehab Walk Test from 09/08/2016 in [place]  Referring Provider  dr. Chase Caller      Encounter Date: 11/17/2016  Check In:     Session Check In - 11/17/16 1026      Check-In   Location [facility]   Staff Present Rosebud Poles, RN, BSN;Jaclene Bartelt, MS, ACSM RCEP, Exercise Physiologist;Lisa Ysidro Evert, RN;Portia Rollene Rotunda, RN, BSN   Supervising physician immediately available to respond to emergencies [facility] immediately available   Physician(s) Dr. Posey Pronto   Medication changes reported     No   Fall or balance concerns reported    No   Warm-up and Cool-down Performed as group-led instruction   Resistance Training Performed Yes   VAD Patient? No     Pain Assessment   Currently in Pain? No/denies   Multiple Pain Sites No      Capillary Blood Glucose: No results found for this or any previous visit (from the past 24 hour(s)).      Exercise Prescription Changes - 11/17/16 1200      Exercise Review   Progression Yes     Response to Exercise   Blood Pressure (Admit) 102/54   Blood Pressure (Exercise) 122/70   Blood Pressure (Exit) 100/60   Heart Rate (Admit) 67 bpm   Heart Rate (Exercise) 109 bpm   Heart Rate (Exit) 77 bpm   Oxygen Saturation (Admit) 99 %   Oxygen Saturation (Exercise) 97 %   Oxygen Saturation (Exit) 96 %   Rating of Perceived Exertion (Exercise) 15   Perceived Dyspnea (Exercise) 2   Duration Progress to 45 minutes of aerobic exercise without signs/symptoms of physical distress   Intensity THRR unchanged     Progression   Progression Continue to progress workloads to maintain intensity without signs/symptoms of physical distress.     Resistance Training   Training Prescription Yes   Weight orange bands   Reps 10-12  10 minutes of strength  training     Interval Training   Interval Training No     Recumbant Bike   Level 4   Minutes 17     NuStep   Level 5   Minutes 17   METs 2.4     Track   Laps 13   Minutes 17     Goals Met:  Exercise tolerated well No report of cardiac concerns or symptoms Strength training completed today  Goals Unmet:  Not Applicable  Comments: Service time is from 10:30am to 12:05p    Dr. Rush Farmer is Medical Director for Pulmonary Rehab at [facility].

## 2016-11-19 ENCOUNTER — Encounter (HOSPITAL_COMMUNITY)
Admission: RE | Admit: 2016-11-19 | Discharge: 2016-11-19 | Disposition: A | Discharge status: Home / Self Care | Payer: Medicare Other | Source: Ambulatory Visit | Attending: Internal Medicine | Admitting: Internal Medicine

## 2016-11-19 VITALS — Wt 215.6 lb

## 2016-11-19 DIAGNOSIS — R0602 Shortness of breath: Secondary | ICD-10-CM | POA: Diagnosis not present

## 2016-11-19 NOTE — Progress Notes (Signed)
Brittany Robertson 76 y.o. female Nutrition Note Spoke with pt. Pt is obese and wants to lose wt. Per discussion, pt has lost 75 lb three times through [employer]. Pt is making healthy food choices the majority of the time.  Pt's Rate Your Plate results reviewed with pt. Pt avoids most salty food; does not use canned/ convenience food often. he role of sodium in lung disease reviewed with pt. Pt is diabetic. Pt expressed understanding of the information reviewed via feedback method.    No results found for: HGBA1C  Nutrition Diagnosis ? Food-and nutrition-related knowledge deficit related to lack of exposure to information as related to diagnosis of pulmonary disease ? Obesity related to excessive energy intake as evidenced by a BMI of 37.1  Nutrition Intervention ? Pt's individual nutrition plan and goals reviewed with pt. ? Benefits of adopting healthy eating habits discussed when pt's Rate Your Plate reviewed. ? Pt to attend the Nutrition and Lung Disease class ? Continual client-centered nutrition education by RD, as part of interdisciplinary care. Goal(s) 1. Change from whole milk to 2% milk 2. Increase consumption of whole grains to at least 1-2 servings per day 3. Identify food quantities necessary to achieve wt loss of  -2# per week to a goal wt loss of 2.7-10.9 kg (6-24 lb) at graduation from pulmonary rehab. Monitor and Evaluate progress toward nutrition goal with team.   Derek Mound, M.Ed, RD, LDN, CDE 11/19/2016 12:22 PM

## 2016-11-19 NOTE — Progress Notes (Signed)
Daily Session Note  Patient Details  Name: Brittany Robertson MRN: [id number] Date of Birth: Feb 20, 1941 Referring Provider:   April Manson Pulmonary Rehab Walk Test from 09/08/2016 in [place]  Referring Provider  dr. Chase Caller      Encounter Date: 11/19/2016  Check In:     Session Check In - 11/19/16 1030      Check-In   Location [facility]   Staff Present Rosebud Poles, RN, BSN;Hafsa Lohn Ysidro Evert, RN;Portia Rollene Rotunda, RN, BSN   Supervising physician immediately available to respond to emergencies [facility] immediately available   Physician(s) Dr. Broadus John   Medication changes reported     No   Fall or balance concerns reported    No   Warm-up and Cool-down Performed as group-led instruction   Resistance Training Performed Yes   VAD Patient? No     Pain Assessment   Currently in Pain? No/denies   Multiple Pain Sites No      Capillary Blood Glucose: No results found for this or any previous visit (from the past 24 hour(s)).      Exercise Prescription Changes - 11/19/16 1200      Exercise Review   Progression Yes     Response to Exercise   Blood Pressure (Admit) 102/64   Blood Pressure (Exercise) 150/70   Blood Pressure (Exit) 96/66   Heart Rate (Admit) 67 bpm   Heart Rate (Exercise) 98 bpm   Heart Rate (Exit) 76 bpm   Oxygen Saturation (Admit) 99 %   Oxygen Saturation (Exercise) 98 %   Oxygen Saturation (Exit) 98 %   Rating of Perceived Exertion (Exercise) 13   Perceived Dyspnea (Exercise) 2   Duration Progress to 45 minutes of aerobic exercise without signs/symptoms of physical distress   Intensity THRR unchanged     Progression   Progression Continue to progress workloads to maintain intensity without signs/symptoms of physical distress.     Resistance Training   Training Prescription Yes   Weight orange bands   Reps 10-12  10 minuites of strength training     Interval Training   Interval Training No      Recumbant Bike   Level 5   Minutes 17     Goals Met:  Exercise tolerated well No report of cardiac concerns or symptoms Strength training completed today  Goals Unmet:  Not Applicable  Comments: Service time is from 1030 to 1230    Dr. Rush Farmer is Medical Director for Pulmonary Rehab at [facility].

## 2016-11-20 ENCOUNTER — Ambulatory Visit (HOSPITAL_BASED_OUTPATIENT_CLINIC_OR_DEPARTMENT_OTHER): Payer: Medicare Other | Attending: Pulmonary Disease | Admitting: Pulmonary Disease

## 2016-11-20 VITALS — Ht 64.0 in | Wt 213.0 lb

## 2016-11-20 DIAGNOSIS — G4710 Hypersomnia, unspecified: Secondary | ICD-10-CM | POA: Diagnosis present

## 2016-11-20 DIAGNOSIS — G4730 Sleep apnea, unspecified: Secondary | ICD-10-CM

## 2016-11-20 DIAGNOSIS — R0683 Snoring: Secondary | ICD-10-CM | POA: Insufficient documentation

## 2016-11-24 ENCOUNTER — Encounter (HOSPITAL_COMMUNITY)
Admission: RE | Admit: 2016-11-24 | Discharge: 2016-11-24 | Disposition: A | Discharge status: Home / Self Care | Payer: Medicare Other | Source: Ambulatory Visit | Attending: Internal Medicine | Admitting: Internal Medicine

## 2016-11-24 ENCOUNTER — Other Ambulatory Visit: Payer: Self-pay | Admitting: Family Medicine

## 2016-11-24 VITALS — Wt 216.3 lb

## 2016-11-24 DIAGNOSIS — R0602 Shortness of breath: Secondary | ICD-10-CM | POA: Diagnosis not present

## 2016-11-24 DIAGNOSIS — Z1231 Encounter for screening mammogram for malignant neoplasm of breast: Secondary | ICD-10-CM

## 2016-11-24 NOTE — Progress Notes (Signed)
Daily Session Note  Patient Details  Name: Brittany Robertson MRN: [id number] Date of Birth: 01-Jan-1941 Referring Provider:   April Manson Pulmonary Rehab Walk Test from 09/08/2016 in [place]  Referring Provider  dr. Chase Caller      Encounter Date: 11/24/2016  Check In:     Session Check In - 11/24/16 1030      Check-In   Location [facility]   Staff Present Rosebud Poles, RN, BSN;Molly diVincenzo, MS, ACSM RCEP, Exercise Physiologist;Raychell Holcomb Ysidro Evert, RN;Portia Rollene Rotunda, RN, BSN   Supervising physician immediately available to respond to emergencies [facility] immediately available   Physician(s) Dr. Cathlean Sauer   Medication changes reported     No   Fall or balance concerns reported    No   Warm-up and Cool-down Performed as group-led instruction   Resistance Training Performed Yes   VAD Patient? No     Pain Assessment   Currently in Pain? No/denies   Multiple Pain Sites No      Capillary Blood Glucose: No results found for this or any previous visit (from the past 24 hour(s)).      Exercise Prescription Changes - 11/24/16 1200      Exercise Review   Progression Yes     Response to Exercise   Blood Pressure (Admit) 120/66   Blood Pressure (Exercise) 112/80   Blood Pressure (Exit) 96/60   Heart Rate (Admit) 69 bpm   Heart Rate (Exercise) 111 bpm   Heart Rate (Exit) 88 bpm   Oxygen Saturation (Admit) 98 %   Oxygen Saturation (Exercise) 97 %   Oxygen Saturation (Exit) 97 %   Rating of Perceived Exertion (Exercise) 13   Perceived Dyspnea (Exercise) 2   Duration Progress to 45 minutes of aerobic exercise without signs/symptoms of physical distress   Intensity THRR unchanged     Progression   Progression Continue to progress workloads to maintain intensity without signs/symptoms of physical distress.     Resistance Training   Training Prescription Yes   Weight orange bands   Reps 10-12  10 minutes of strength  training     Interval Training   Interval Training No     Recumbant Bike   Level 5   Minutes 17     NuStep   Level 6   Minutes 17   METs 2.7     Track   Laps 15   Minutes 17     Goals Met:  Exercise tolerated well No report of cardiac concerns or symptoms Strength training completed today  Goals Unmet:  Not Applicable  Comments: Service time is from 1030 to 1205    Dr. Rush Farmer is Medical Director for Pulmonary Rehab at [facility].

## 2016-11-25 ENCOUNTER — Telehealth: Payer: Self-pay | Admitting: Pulmonary Disease

## 2016-11-25 NOTE — Telephone Encounter (Signed)
    Diagnostic Sleep Study was negative for sleep apnea. Although the patient only slept for 220 minutes, she felt rested during that night and did not have any issues.  I discussed the results with the patient. Plan to hold off on further follow-up and tests.  If she gets more symptomatic, she was advised to call and we will probably repeat a study then.  Jasmine : pls cancel f/u appointment with me. Thanks.   Monica Becton, MD 11/25/2016, 5:11 PM [place] Pager [phone] After 3 pm or if no answer, call [phone]

## 2016-11-25 NOTE — Procedures (Signed)
    NAME: Brittany Robertson DATE OF BIRTH:  1941/01/20 MEDICAL RECORD NUMBER [id number]  LOCATION: [facility]  PHYSICIAN: [place] OF STUDY: 11/20/2016   CLINICAL INFORMATION  Sleep Study Type: NPSG  Indication for sleep study: Excessive Daytime Sleepiness   Epworth Sleepiness Score: 0  SLEEP STUDY TECHNIQUE  As per the AASM Manual for the Scoring of Sleep and Associated Events v2.3 (April 2016) with a hypopnea requiring 4% desaturations.  The channels recorded and monitored were frontal, central and occipital EEG, electrooculogram (EOG), submentalis EMG (chin), nasal and oral airflow, thoracic and abdominal wall motion, anterior tibialis EMG, snore microphone, electrocardiogram, and pulse oximetry.   MEDICATIONS  Medications self-administered by patient taken the night of the study : N/A. meds reviewed per chart review.  SLEEP ARCHITECTURE  The study was initiated at 9:58:47 PM and ended at 4:07:10 AM.  Sleep onset time was 16.0 minutes and the sleep efficiency was 60.9%. The total sleep time was 224.5 minutes.  Stage REM latency was 101.5 minutes.  The patient spent 4.23% of the night in stage N1 sleep, 43.43% in stage N2 sleep, 36.53% in stage N3 and 15.81% in REM.  Alpha intrusion was absent.  Supine sleep was 100.00%.   RESPIRATORY PARAMETERS  The overall apnea/hypopnea index (AHI) was 1.3 per hour. There were 0 total apneas, including 0 obstructive, 0 central and 0 mixed apneas. There were 5 hypopneas and 45 RERAs.  The AHI during Stage REM sleep was 8.5 per hour. AHI while supine was 1.3 per hour.  The mean oxygen saturation was 94.79%. The minimum SpO2 during sleep was 90.00%.  Soft snoring was noted during this study.  CARDIAC DATA  The 2 lead EKG demonstrated sinus rhythm. The mean heart rate was 118.31 beats per minute. Other EKG findings include: None.   LEG MOVEMENT DATA  The total PLMS were 112 with a resulting PLMS index of  29.93. Associated arousal with leg movement index was 8.8 .  IMPRESSIONS  1. No evidence for significant obstructive sleep apnea occurred during this study (AHI = 1.3/h). 2. No significant central sleep apnea occurred during this study (CAI = 0.0/h). The patient had minimal or no oxygen desaturation during the study (Min O2 = 90.00%) 3. The patient snored with Soft snoring volume. 4. No cardiac abnormalities were noted during this study. 5. Moderate periodic limb movements of sleep occurred during the study. Associated arousals were significant.  DIAGNOSIS  No evidence for significant OSA based on this study.   RECOMMENDATIONS  1. No evidence for significant OSA based on this study. Patient was restless throughout the study. Clinical correlation recommended. If patient remains symptomatic, suggest getting a HST to gather more data. 2. Avoid alcohol, sedatives and other CNS depressants that may worsen sleep apnea and disrupt normal sleep architecture. 3. Sleep hygiene should be reviewed to assess factors that may improve sleep quality. 4. Weight management and regular exercise should be initiated or continued if appropriate. 5. Follow up in the office as scheduled.    Monica Becton, MD 11/25/2016, 9:20 AM [facility] Pager [phone] After 3 pm or if no answer, call [phone]

## 2016-11-25 NOTE — Telephone Encounter (Signed)
Will do!

## 2016-11-26 ENCOUNTER — Encounter (HOSPITAL_COMMUNITY)
Admission: RE | Admit: 2016-11-26 | Discharge: 2016-11-26 | Disposition: A | Discharge status: Home / Self Care | Payer: Medicare Other | Source: Ambulatory Visit | Attending: Internal Medicine | Admitting: Internal Medicine

## 2016-11-26 VITALS — Wt 216.1 lb

## 2016-11-26 DIAGNOSIS — R0602 Shortness of breath: Secondary | ICD-10-CM

## 2016-11-26 NOTE — Progress Notes (Signed)
Daily Session Note  Patient Details  Name: Brittany Robertson MRN: [id number] Date of Birth: 1941/02/16 Referring Provider:   April Manson Pulmonary Rehab Walk Test from 09/08/2016 in [place]  Referring Provider  dr. Chase Caller      Encounter Date: 11/26/2016  Check In:     Session Check In - 11/26/16 1030      Check-In   Location [facility]   Staff Present Rosebud Poles, RN, BSN;Molly diVincenzo, MS, ACSM RCEP, Exercise Physiologist;Rebekkah Powless Ysidro Evert, RN;Portia Rollene Rotunda, RN, BSN   Supervising physician immediately available to respond to emergencies [facility] immediately available   Physician(s) Dr. Lindaann Pascal   Medication changes reported     No   Fall or balance concerns reported    Yes   Warm-up and Cool-down Performed as group-led instruction   Resistance Training Performed Yes   VAD Patient? No     Pain Assessment   Currently in Pain? No/denies   Multiple Pain Sites No      Capillary Blood Glucose: No results found for this or any previous visit (from the past 24 hour(s)).      Exercise Prescription Changes - 11/26/16 1200      Response to Exercise   Blood Pressure (Admit) 118/66   Blood Pressure (Exercise) 136/70   Blood Pressure (Exit) 107/59   Heart Rate (Admit) 73 bpm   Heart Rate (Exercise) 110 bpm   Heart Rate (Exit) 85 bpm   Oxygen Saturation (Admit) 100 %   Oxygen Saturation (Exercise) 97 %   Oxygen Saturation (Exit) 97 %   Rating of Perceived Exertion (Exercise) 13   Perceived Dyspnea (Exercise) 3   Duration Progress to 45 minutes of aerobic exercise without signs/symptoms of physical distress   Intensity THRR unchanged     Progression   Progression Continue to progress workloads to maintain intensity without signs/symptoms of physical distress.     Resistance Training   Training Prescription Yes   Weight orange bands  orange bands   Reps 10-12  10 minutes of strength training     Interval  Training   Interval Training No     NuStep   Level 6   Minutes 17   METs 2.8     Track   Laps 13   Minutes 17     Goals Met:  Exercise tolerated well No report of cardiac concerns or symptoms Strength training completed today  Goals Unmet:  Not Applicable  Comments: Service time is from 1030 to 1240 Attended Doctor Day class today.   Dr. Rush Farmer is Medical Director for Pulmonary Rehab at [facility].

## 2016-12-01 ENCOUNTER — Encounter (HOSPITAL_COMMUNITY)
Admission: RE | Admit: 2016-12-01 | Discharge: 2016-12-01 | Disposition: A | Discharge status: Home / Self Care | Payer: Medicare Other | Source: Ambulatory Visit | Attending: Internal Medicine | Admitting: Internal Medicine

## 2016-12-01 VITALS — Wt 215.8 lb

## 2016-12-01 DIAGNOSIS — R0602 Shortness of breath: Secondary | ICD-10-CM

## 2016-12-01 NOTE — Progress Notes (Signed)
Pulmonary Individual Treatment Plan  Patient Details  Name: Brittany Robertson MRN: [id number] Date of Birth: 1941-05-02 Referring Provider:   April Manson Pulmonary Rehab Walk Test from 09/08/2016 in [facility]  Referring Provider  dr. Chase Caller      Initial Encounter Date:  Flowsheet Row Pulmonary Rehab Walk Test from 09/08/2016 in [place]  Date  09/08/16  Referring Provider  dr. Chase Caller      Visit Diagnosis: SOB (shortness of breath)  Patient's Home Medications on Admission:   Current Outpatient Prescriptions:  .  acetaminophen (TYLENOL) 500 MG tablet, Take 500-1,000 mg by mouth daily as needed for headache., Disp: , Rfl:  .  aspirin 81 MG tablet, Take 81 mg by mouth every evening. , Disp: , Rfl:  .  atorvastatin (LIPITOR) 20 MG tablet, Take 20 mg by mouth at bedtime. Brand Name Only, Disp: , Rfl:  .  Calcium Carbonate-Vitamin D (CALCIUM + D PO), Take 1 tablet by mouth every other day. , Disp: , Rfl:  .  cetirizine (ZYRTEC) 10 MG tablet, Take 10 mg by mouth every morning.  , Disp: , Rfl:  .  cholecalciferol (VITAMIN D) 400 UNITS TABS, Take 400 Units by mouth every other day. , Disp: , Rfl:  .  Coenzyme Q10 Liposomal 100 MG/ML LIQD, Take 100 mg by mouth every evening., Disp: , Rfl:  .  denosumab (PROLIA) 60 MG/ML SOLN injection, Inject 60 mg into the skin every 6 (six) months. Administer in upper arm, thigh, or abdomen, Disp: , Rfl:  .  docusate sodium (COLACE) 100 MG capsule, Take 100 mg by mouth 2 (two) times daily. , Disp: , Rfl:  .  esomeprazole (NEXIUM) 40 MG capsule, Take 40 mg by mouth daily before breakfast.  , Disp: , Rfl:  .  ezetimibe (ZETIA) 10 MG tablet, Take 1 tablet (10 mg total) by mouth every evening., Disp: 90 tablet, Rfl: 3 .  fish oil-omega-3 fatty acids 1000 MG capsule, Take 1 g by mouth 2 (two) times daily.  , Disp: , Rfl:  .  furosemide (LASIX) 20 MG tablet, Take 20 mg by mouth daily as  needed for fluid or edema. , Disp: , Rfl:  .  meloxicam (MOBIC) 15 MG tablet, Take 15 mg by mouth every evening.  , Disp: , Rfl:  .  methocarbamol (ROBAXIN) 500 MG tablet, Take 500 mg by mouth 4 (four) times daily as needed for muscle spasms., Disp: , Rfl:  .  metoprolol succinate (TOPROL-XL) 25 MG 24 hr tablet, Take 1 tablet by mouth  daily, Disp: 90 tablet, Rfl: 0 .  mirabegron ER (MYRBETRIQ) 25 MG TB24 tablet, Take 25 mg by mouth every evening., Disp: , Rfl:  .  nitroGLYCERIN (NITROSTAT) 0.4 MG SL tablet, Place 1 tablet (0.4 mg total) under the tongue every 5 (five) minutes as needed. As needed for chest pain x 3 doses, Disp: 25 tablet, Rfl: 1 .  olmesartan (BENICAR) 40 MG tablet, Take 1 tablet (40 mg total) by mouth daily., Disp: 90 tablet, Rfl: 3 .  Polyethyl Glycol-Propyl Glycol (SYSTANE ULTRA OP), Apply 1 drop to eye 2 (two) times daily as needed (dry eyes)., Disp: , Rfl:  .  potassium chloride SA (K-DUR,KLOR-CON) 20 MEQ tablet, Take 1 tablet by mouth  every morning, Disp: 90 tablet, Rfl: 0 .  vitamin C (ASCORBIC ACID) 500 MG tablet, Take 500 mg by mouth 2 (two) times daily. , Disp: , Rfl:  .  vitamin E 400 UNIT capsule, Take 400 Units by mouth at bedtime. , Disp: , Rfl:   Past Medical History: Past Medical History:  Diagnosis Date  . Bruises easily   . Chronic diastolic CHF (congestive heart failure) ([place])   . Complication of anesthesia    "takes a long time towake up"  . Coronary artery disease    PCI 2006, repeat cath 30% mid LAD distal to stent otherwise normal coronary arteries, cath 08/2013 with patent LAD stent and otherwise normal coronary arteries with diastolic dysfunction  . DDD (degenerative disc disease), cervical   . Degenerative disk disease   . DOE (dyspnea on exertion)    chronic secondary to obesity, deconditiong and chronic diastolic CHF  . GERD (gastroesophageal reflux disease)   . Hyperlipidemia   . Hypertension   . OA (osteoarthritis)   . Obesity   .  Osteopenia   . PONV (postoperative nausea and vomiting)    Motion sickness when move out of PACU to room  . PVC's (premature ventricular contractions)   . SOB (shortness of breath) 07/13/2016    Tobacco Use: History  Smoking Status  . Never Smoker  Smokeless Tobacco  . Never Used    Labs: Recent Review Flowsheet Data    Labs for ITP Cardiac and Pulmonary Rehab Latest Ref Rng & Units 12/07/2014 04/24/2016 08/31/2016 08/31/2016 08/31/2016   Cholestrol 125 - 200 mg/dL 119 120(L) - - -   LDLCALC <130 mg/dL 56 36 - - -   HDL >=46 mg/dL 41.10 58 - - -   Trlycerides <150 mg/dL 109.0 130 - - -   PHART 7.350 - 7.450 - - 7.412 - -   PCO2ART 32.0 - 48.0 mmHg - - 36.0 - -   HCO3 20.0 - 28.0 mmol/L - - 22.9 23.3 22.2   TCO2 0 - 100 mmol/L - - _0 ACIDBASEDEF 0.0 - 2.0 mmol/L - - 1.0 1.0 3.0(H)   O2SAT % - - 93.0 68.0 65.0      Capillary Blood Glucose: No results found for: GLUCAP   ADL UCSD:   Pulmonary Function Assessment:     Pulmonary Function Assessment - 09/07/16 0943      Breath   Bilateral Breath Sounds Clear   Shortness of Breath Yes;Limiting activity      Exercise Target Goals:    Exercise Program Goal: Individual exercise prescription set with THRR, safety & activity barriers. Participant demonstrates ability to understand and report RPE using BORG scale, to self-measure pulse accurately, and to acknowledge the importance of the exercise prescription.  Exercise Prescription Goal: Starting with aerobic activity 30 plus minutes a day, 3 days per week for initial exercise prescription. Provide home exercise prescription and guidelines that participant acknowledges understanding prior to discharge.  Activity Barriers & Risk Stratification:     Activity Barriers & Cardiac Risk Stratification - 09/07/16 0939      Activity Barriers & Cardiac Risk Stratification   Activity Barriers Right Knee Replacement;Left Knee Replacement;Arthritis;Back Problems;Shortness  of Breath  degenerative disc disease      6 Minute Walk:     6 Minute Walk    Row Name 09/08/16 1633         6 Minute Walk   Phase Initial     Distance 1020 feet     Walk Time 6 minutes     # of Rest Breaks 0     MPH 1.93     METS 2.45  RPE 14     Perceived Dyspnea  1     Symptoms No     Resting HR 66 bpm     Resting BP 119/80     Max Ex. HR 102 bpm     Max Ex. BP 125/76       Interval HR   Baseline HR 66     1 Minute HR 81     2 Minute HR 94     3 Minute HR 99     4 Minute HR 101     5 Minute HR 98     6 Minute HR 102     2 Minute Post HR 68     Interval Heart Rate? Yes       Interval Oxygen   Interval Oxygen? Yes     Baseline Oxygen Saturation % 97 %     Baseline Liters of Oxygen 0 L     1 Minute Oxygen Saturation % 96 %     1 Minute Liters of Oxygen 0 L     2 Minute Oxygen Saturation % 99 %     2 Minute Liters of Oxygen 0 L     3 Minute Oxygen Saturation % 96 %     3 Minute Liters of Oxygen 0 L     4 Minute Oxygen Saturation % 91 %     4 Minute Liters of Oxygen 0 L     5 Minute Oxygen Saturation % 90 %     5 Minute Liters of Oxygen 0 L     6 Minute Oxygen Saturation % 90 %     6 Minute Liters of Oxygen 0 L     2 Minute Post Oxygen Saturation % 99 %     2 Minute Post Liters of Oxygen 0 L        Initial Exercise Prescription:     Initial Exercise Prescription - 09/08/16 1600      Date of Initial Exercise RX and Referring Provider   Date 09/08/16   Referring Provider dr. Chase Caller     NuStep   Level 2   Minutes 17   METs 1.5     Arm Ergometer   Level 2   Minutes 17     Track   Laps 5   Minutes 17     Prescription Details   Frequency (times per week) 2   Duration Progress to 45 minutes of aerobic exercise without signs/symptoms of physical distress     Intensity   THRR 40-80% of Max Heartrate 57-114   Ratings of Perceived Exertion 11-13   Perceived Dyspnea 0-4     Progression   Progression Continue progressive overload as  per policy without signs/symptoms or physical distress.     Resistance Training   Training Prescription Yes   Weight orange bands   Reps 10-12      Perform Capillary Blood Glucose checks as needed.  Exercise Prescription Changes:     Exercise Prescription Changes    Row Name 09/29/16 1200 10/01/16 1200 10/06/16 1200 10/08/16 1200 10/13/16 1227     Exercise Review   Progression  -  - Yes Yes  -     Response to Exercise   Blood Pressure (Admit) 120/70 168/104 130/80 106/60 112/60   Blood Pressure (Exercise) 120/80 142/80 120/80 114/70 112/66   Blood Pressure (Exit) 110/79 104/72 110/66 104/60 100/62   Heart Rate (Admit) 58 bpm 60 bpm 59 bpm 56 bpm  59 bpm   Heart Rate (Exercise) 82 bpm 85 bpm 83 bpm 97 bpm 84 bpm   Heart Rate (Exit) 75 bpm 72 bpm 70 bpm 71 bpm 69 bpm   Oxygen Saturation (Admit) 96 % 98 % 100 % 100 % 100 %   Oxygen Saturation (Exercise) 100 % 92 % 96 % 94 % 94 %   Oxygen Saturation (Exit) 95 % 98 % 97 % 98 % 96 %   Rating of Perceived Exertion (Exercise) '13 13 13 13 13   '$ Perceived Dyspnea (Exercise) '2 3 2 2 2   '$ Duration Progress to 45 minutes of aerobic exercise without signs/symptoms of physical distress Progress to 45 minutes of aerobic exercise without signs/symptoms of physical distress Progress to 45 minutes of aerobic exercise without signs/symptoms of physical distress Progress to 45 minutes of aerobic exercise without signs/symptoms of physical distress Progress to 45 minutes of aerobic exercise without signs/symptoms of physical distress   Intensity -  40-80% of HRR -  40-80% of HRR -  40-80% of HRR THRR unchanged THRR unchanged     Progression   Progression Continue to progress workloads to maintain intensity without signs/symptoms of physical distress. Continue to progress workloads to maintain intensity without signs/symptoms of physical distress. Continue to progress workloads to maintain intensity without signs/symptoms of physical distress. Continue  to progress workloads to maintain intensity without signs/symptoms of physical distress. Continue to progress workloads to maintain intensity without signs/symptoms of physical distress.     Resistance Training   Training Prescription Yes Yes Yes Yes Yes   Weight orange bands orange bands orange bands orange bands orange bands   Reps 10-12  10 minutes of strength training 10-12  10 minutes of strength training 10-12  10 minutes of strength training 10-12  10 minutes of strength training 10-12  10 minutes of strength training     Recumbant Bike   Level  -  - 2 3 2.5  decreased workload r/t sore legs   Minutes  -  - '17 17 17     '$ NuStep   Level '2 2 3  '$ - 3   Minutes '17 17 17  '$ - 17   METs 1.8 1.8 1.8  - 1.7     Arm Ergometer   Level 1  Workload redued due to pt c/o of shoulder pain -  Workload redued due to pt c/o of shoulder pain -  Workload redued due to pt c/o of shoulder pain  -  -   Minutes 17  -  -  -  -     Track   Laps '12 12 14 14 14   '$ Minutes '17 17 17 17 17   '$ Row Name 10/15/16 1200 11/03/16 1200 11/10/16 1205 11/12/16 1200 11/17/16 1200     Exercise Review   Progression  -  - Yes Yes Yes     Response to Exercise   Blood Pressure (Admit) 136/74 118/68 114/74 112/70 102/54   Blood Pressure (Exercise) 110/64 100/60 132/88 106/60 122/70   Blood Pressure (Exit) 122/80 108/64 110/60 114/61 100/60   Heart Rate (Admit) 57 bpm 59 bpm 59 bpm 66 bpm 67 bpm   Heart Rate (Exercise) 88 bpm 89 bpm 94 bpm 104 bpm 109 bpm   Heart Rate (Exit) 73 bpm 67 bpm 79 bpm 74 bpm 77 bpm   Oxygen Saturation (Admit) 96 % 99 % 100 % 98 % 99 %   Oxygen Saturation (Exercise) 96 % 100 % 97 %  99 % 97 %   Oxygen Saturation (Exit) 99 % 95 % 97 % 95 % 96 %   Rating of Perceived Exertion (Exercise) '13 12 17 13 15   '$ Perceived Dyspnea (Exercise) '2 2 3 '$ 2.5 2   Duration Progress to 45 minutes of aerobic exercise without signs/symptoms of physical distress Progress to 45 minutes of aerobic exercise without  signs/symptoms of physical distress Progress to 45 minutes of aerobic exercise without signs/symptoms of physical distress Progress to 45 minutes of aerobic exercise without signs/symptoms of physical distress Progress to 45 minutes of aerobic exercise without signs/symptoms of physical distress   Intensity THRR unchanged THRR unchanged THRR unchanged THRR unchanged THRR unchanged     Progression   Progression Continue to progress workloads to maintain intensity without signs/symptoms of physical distress. Continue to progress workloads to maintain intensity without signs/symptoms of physical distress. Continue to progress workloads to maintain intensity without signs/symptoms of physical distress. Continue to progress workloads to maintain intensity without signs/symptoms of physical distress. Continue to progress workloads to maintain intensity without signs/symptoms of physical distress.     Resistance Training   Training Prescription Yes Yes Yes Yes Yes   Weight orange bands orange bands orange bands orange bands orange bands   Reps 10-12  10 minutes of strength training 10-12  10 minutes of strength training 10-12  10 minutes of strength training 10-12  10 minutes of strength training 10-12  10 minutes of strength training     Interval Training   Interval Training  - No No No No     Recumbant Bike   Level  - 3 4  - 4   Minutes  - 17 17  - 17     NuStep   Level '3 3 3 4 5   '$ Minutes '17 17 17 17 17   '$ METs 2.1 2.1 2.2 2.2 2.4     Track   Laps '15 12 11 15 13   '$ Minutes '17 17 17 17 17     '$ Home Exercise Plan   Plans to continue exercise at  -  -  -  - Home  [employer] [facility] Home   Frequency  -  -  -  - Add 3 additional days to program exercise sessions.   Row Name 11/19/16 1200 11/24/16 1200 11/26/16 1200 12/01/16 1200       Exercise Review   Progression Yes Yes  -  -      Response to Exercise   Blood Pressure (Admit) 102/64 120/66 118/66 104/70    Blood Pressure (Exercise) 150/70  112/80 136/70 128/68    Blood Pressure (Exit) 96/66 96/60 107/59 100/50    Heart Rate (Admit) 67 bpm 69 bpm 73 bpm 58 bpm    Heart Rate (Exercise) 98 bpm 111 bpm 110 bpm 106 bpm    Heart Rate (Exit) 76 bpm 88 bpm 85 bpm 85 bpm    Oxygen Saturation (Admit) 99 % 98 % 100 % 98 %    Oxygen Saturation (Exercise) 98 % 97 % 97 % 97 %    Oxygen Saturation (Exit) 98 % 97 % 97 % 98 %    Rating of Perceived Exertion (Exercise) '13 13 13 17    '$ Perceived Dyspnea (Exercise) '2 2 3 '$ 2.5    Duration Progress to 45 minutes of aerobic exercise without signs/symptoms of physical distress Progress to 45 minutes of aerobic exercise without signs/symptoms of physical distress Progress to 45 minutes of aerobic exercise without signs/symptoms  of physical distress Progress to 45 minutes of aerobic exercise without signs/symptoms of physical distress    Intensity THRR unchanged THRR unchanged THRR unchanged THRR unchanged      Progression   Progression Continue to progress workloads to maintain intensity without signs/symptoms of physical distress. Continue to progress workloads to maintain intensity without signs/symptoms of physical distress. Continue to progress workloads to maintain intensity without signs/symptoms of physical distress. Continue to progress workloads to maintain intensity without signs/symptoms of physical distress.      Resistance Training   Training Prescription Yes Yes Yes Yes    Weight orange bands orange bands orange bands  orange bands orange bands    Reps 10-12  10 minuites of strength training 10-12  10 minutes of strength training 10-12  10 minutes of strength training 10-12  10 minutes of strength training      Interval Training   Interval Training No No No No      Recumbant Bike   Level 5 5  - 4.5    Minutes 17 17  - 17      NuStep   Level  - '6 6 6    '$ Minutes  - '17 17 17    '$ METs  - 2.7 2.8 2.1      Track   Laps  - 15 13 14.5    Minutes  - '17 17 17       '$ Exercise  Comments:     Exercise Comments    Row Name 10/06/16 0849 11/09/16 1645 11/17/16 1220 11/30/16 1307     Exercise Comments Patient has only attended two sessions. Will cont. to monitor.  Patient has had extended leave of absence due to sickness. She has just returned to rehab. Will cont. to monitor and progress.  Home exercise completed Patient is progressing well. Up to 13 laps in 15 minutes. Open to exercise intensity changes. Attendance is great now that she isnt sick. Will cont. to monitor and progress.        Discharge Exercise Prescription (Final Exercise Prescription Changes):     Exercise Prescription Changes - 12/01/16 1200      Response to Exercise   Blood Pressure (Admit) 104/70   Blood Pressure (Exercise) 128/68   Blood Pressure (Exit) 100/50   Heart Rate (Admit) 58 bpm   Heart Rate (Exercise) 106 bpm   Heart Rate (Exit) 85 bpm   Oxygen Saturation (Admit) 98 %   Oxygen Saturation (Exercise) 97 %   Oxygen Saturation (Exit) 98 %   Rating of Perceived Exertion (Exercise) 17   Perceived Dyspnea (Exercise) 2.5   Duration Progress to 45 minutes of aerobic exercise without signs/symptoms of physical distress   Intensity THRR unchanged     Progression   Progression Continue to progress workloads to maintain intensity without signs/symptoms of physical distress.     Resistance Training   Training Prescription Yes   Weight orange bands   Reps 10-12  10 minutes of strength training     Interval Training   Interval Training No     Recumbant Bike   Level 4.5   Minutes 17     NuStep   Level 6   Minutes 17   METs 2.1     Track   Laps 14.5   Minutes 17       Nutrition:  Target Goals: Understanding of nutrition guidelines, daily intake of sodium '1500mg'$ , cholesterol '200mg'$ , calories 30% from fat and 7% or less from saturated fats,  daily to have 5 or more servings of fruits and vegetables.  Biometrics:     Pre Biometrics - 09/07/16 0944      Pre Biometrics    Grip Strength 24 kg       Nutrition Therapy Plan and Nutrition Goals:     Nutrition Therapy & Goals - 11/16/16 1345      Nutrition Therapy   Diet Therapeutic Lifestyle Changes     Personal Nutrition Goals   Personal Goal #1 Wt loss of 1-2 lb/week with a wt loss goal of 6-24 lb at graduation from [facility], educate and counsel regarding individualized specific dietary modifications aiming towards targeted core components such as weight, hypertension, lipid management, diabetes, heart failure and other comorbidities.   Expected Outcomes Short Term Goal: Understand basic principles of dietary content, such as calories, fat, sodium, cholesterol and nutrients.;Long Term Goal: Adherence to prescribed nutrition plan.      Nutrition Discharge: Rate Your Plate Scores:     Nutrition Assessments - 10/01/16 1141      Rate Your Plate Scores   Pre Score 58      Psychosocial: Target Goals: Acknowledge presence or absence of depression, maximize coping skills, provide positive support system. Participant is able to verbalize types and ability to use techniques and skills needed for reducing stress and depression.  Initial Review & Psychosocial Screening:     Initial Psych Review & Screening - 09/07/16 [place]? Yes     Barriers   Psychosocial barriers to participate in program There are no identifiable barriers or psychosocial needs.     Screening Interventions   Interventions Encouraged to exercise      Quality of Life Scores:   PHQ-9: Recent Review Flowsheet Data    Depression screen [facility] 2/9 09/07/2016   Decreased Interest 0   Down, Depressed, Hopeless 0   PHQ - 2 Score 0      Psychosocial Evaluation and Intervention:     Psychosocial Evaluation - 10/13/16 0831      Psychosocial Evaluation & Interventions   Interventions Encouraged to exercise with the program and  follow exercise prescription      Psychosocial Re-Evaluation:     Psychosocial Re-Evaluation    [place] Name 10/13/16 0831 11/10/16 0753 11/30/16 1611         Psychosocial Re-Evaluation   Interventions Encouraged to attend Pulmonary Rehabilitation for the exercise Encouraged to attend Pulmonary Rehabilitation for the exercise Encouraged to attend Pulmonary Rehabilitation for the exercise     Comments no psychosocial barriers identified over the past 30 days no psychosocial barriers identified over the past 30 days no psychosocial barriers identified over the past 30 days       Education: Education Goals: Education classes will be provided on a weekly basis, covering required topics. Participant will state understanding/return demonstration of topics presented.  Learning Barriers/Preferences:     Learning Barriers/Preferences - 09/07/16 0941      Learning Barriers/Preferences   Learning Barriers None   Learning Preferences Written Material;Video;Verbal Instruction;Skilled Demonstration;Pictoral;Individual Instruction;Group Instruction;Audio      Education Topics: Risk Factor Reduction:  -Group instruction that is supported by a PowerPoint presentation. Instructor discusses the definition of a risk factor, different risk factors for pulmonary disease, and how the heart and lungs work together.     Nutrition for Pulmonary Patient:  -Group instruction provided by PowerPoint slides, verbal  discussion, and written materials to support subject matter. The instructor gives an explanation and review of healthy diet recommendations, which includes a discussion on weight management, recommendations for fruit and vegetable consumption, as well as protein, fluid, caffeine, fiber, sodium, sugar, and alcohol. Tips for eating when patients are short of breath are discussed.   Pursed Lip Breathing:  -Group instruction that is supported by demonstration and informational handouts. Instructor  discusses the benefits of pursed lip and diaphragmatic breathing and detailed demonstration on how to preform both.   Flowsheet Row PULMONARY REHAB OTHER RESPIRATORY from 11/19/2016 in [place]  Date  10/08/16  Educator  ep  Instruction Review Code  2- meets goals/outcomes      Oxygen Safety:  -Group instruction provided by PowerPoint, verbal discussion, and written material to support subject matter. There is an overview of "What is Oxygen" and "Why do we need it".  Instructor also reviews how to create a safe environment for oxygen use, the importance of using oxygen as prescribed, and the risks of noncompliance. There is a brief discussion on traveling with oxygen and resources the patient may utilize.   Oxygen Equipment:  -Group instruction provided by [facility] Staff utilizing handouts, written materials, and equipment demonstrations.   Signs and Symptoms:  -Group instruction provided by written material and verbal discussion to support subject matter. Warning signs and symptoms of infection, stroke, and heart attack are reviewed and when to call the physician/911 reinforced. Tips for preventing the spread of infection discussed. Flowsheet Row PULMONARY REHAB OTHER RESPIRATORY from 11/19/2016 in [place]  Date  11/19/16  Educator  RN  Instruction Review Code  2- meets goals/outcomes      Advanced Directives:  -Group instruction provided by verbal instruction and written material to support subject matter. Instructor reviews Advanced Directive laws and proper instruction for filling out document.   Pulmonary Video:  -Group video education that reviews the importance of medication and oxygen compliance, exercise, good nutrition, pulmonary hygiene, and pursed lip and diaphragmatic breathing for the pulmonary patient.   Exercise for the Pulmonary Patient:  -Group instruction that is supported by a PowerPoint  presentation. Instructor discusses benefits of exercise, core components of exercise, frequency, duration, and intensity of an exercise routine, importance of utilizing pulse oximetry during exercise, safety while exercising, and options of places to exercise outside of rehab.     Pulmonary Medications:  -Verbally interactive group education provided by instructor with focus on inhaled medications and proper administration. Flowsheet Row PULMONARY REHAB OTHER RESPIRATORY from 11/19/2016 in [place]  Date  11/12/16  Educator  Pharm D  Instruction Review Code  2- meets goals/outcomes      Anatomy and Physiology of the Respiratory System and Intimacy:  -Group instruction provided by PowerPoint, verbal discussion, and written material to support subject matter. Instructor reviews respiratory cycle and anatomical components of the respiratory system and their functions. Instructor also reviews differences in obstructive and restrictive respiratory diseases with examples of each. Intimacy, Sex, and Sexuality differences are reviewed with a discussion on how relationships can change when diagnosed with pulmonary disease. Common sexual concerns are reviewed. Flowsheet Row PULMONARY REHAB OTHER RESPIRATORY from 11/19/2016 in [place]  Date  10/01/16  Educator  RN  Instruction Review Code  2- meets goals/outcomes      Knowledge Questionnaire Score:   Core Components/Risk Factors/Patient Goals at Admission:  Personal Goals and Risk Factors at Admission - 09/07/16 0945      Core Components/Risk Factors/Patient Goals on Admission   Increase Strength and Stamina Yes   Improve shortness of breath with ADL's Yes      Core Components/Risk Factors/Patient Goals Review:      Goals and Risk Factor Review    Row Name 09/07/16 0946 10/13/16 0830 11/10/16 0753 11/30/16 1611       Core Components/Risk Factors/Patient Goals Review    Personal Goals Review Increase Strength and Stamina;Improve shortness of breath with ADL's Increase Strength and Stamina;Improve shortness of breath with ADL's Increase Strength and Stamina;Improve shortness of breath with ADL's Increase Strength and Stamina;Improve shortness of breath with ADL's    Review Has been sedentary following 2 knee replacements and feels that her shortness of breath is from her knees limiting her activity see comments section on ITP see comments section on ITP see comments section on ITP    Expected Outcomes  - see expected outcomes on Admission documentation see expected outcomes on Admission documentation see expected outcomes on Admission documentation       Core Components/Risk Factors/Patient Goals at Discharge (Final Review):      Goals and Risk Factor Review - 11/30/16 1611      Core Components/Risk Factors/Patient Goals Review   Personal Goals Review Increase Strength and Stamina;Improve shortness of breath with ADL's   Review see comments section on ITP   Expected Outcomes see expected outcomes on Admission documentation      ITP Comments:   Comments: ITP REVIEW Pt is making expected progress toward pulmonary rehab goals after completing 14 sessions. She works to her maximum potential each session. She states her stamina and strength has increased and her shortness of breath has decreased. Recommend continued exercise, life style modification, education, and utilization of breathing techniques to increase stamina and strength and decrease shortness of breath with exertion.

## 2016-12-01 NOTE — Progress Notes (Signed)
Daily Session Note  Patient Details  Name: ZEPHANIAH ENYEART MRN: [id number] Date of Birth: Jan 19, 1941 Referring Provider:   April Manson Pulmonary Rehab Walk Test from 09/08/2016 in [place]  Referring Provider  dr. Chase Caller      Encounter Date: 12/01/2016  Check In:     Session Check In - 12/01/16 1050      Check-In   Location [facility]   Staff Present Rosebud Poles, RN, BSN;Molly diVincenzo, MS, ACSM RCEP, Exercise Physiologist;Shanti Eichel Ysidro Evert, RN;Portia Rollene Rotunda, RN, BSN   Supervising physician immediately available to respond to emergencies [facility] immediately available   Physician(s) Dr. Posey Pronto   Medication changes reported     No   Fall or balance concerns reported    No   Warm-up and Cool-down Performed as group-led instruction   Resistance Training Performed Yes   VAD Patient? No     Pain Assessment   Currently in Pain? No/denies   Multiple Pain Sites No      Capillary Blood Glucose: No results found for this or any previous visit (from the past 24 hour(s)).      Exercise Prescription Changes - 12/01/16 1200      Response to Exercise   Blood Pressure (Admit) 104/70   Blood Pressure (Exercise) 128/68   Blood Pressure (Exit) 100/50   Heart Rate (Admit) 58 bpm   Heart Rate (Exercise) 106 bpm   Heart Rate (Exit) 85 bpm   Oxygen Saturation (Admit) 98 %   Oxygen Saturation (Exercise) 97 %   Oxygen Saturation (Exit) 98 %   Rating of Perceived Exertion (Exercise) 17   Perceived Dyspnea (Exercise) 2.5   Duration Progress to 45 minutes of aerobic exercise without signs/symptoms of physical distress   Intensity THRR unchanged     Progression   Progression Continue to progress workloads to maintain intensity without signs/symptoms of physical distress.     Resistance Training   Training Prescription Yes   Weight orange bands   Reps 10-12  10 minutes of strength training     Interval Training   Interval  Training No     Recumbant Bike   Level 4.5   Minutes 17     NuStep   Level 6   Minutes 17   METs 2.1     Track   Laps 14.5   Minutes 17     Goals Met:  Exercise tolerated well No report of cardiac concerns or symptoms Strength training completed today  Goals Unmet:  Not Applicable  Comments: Service time is from 1030 to 1205     Dr. Rush Farmer is Medical Director for Pulmonary Rehab at [facility].

## 2016-12-03 ENCOUNTER — Encounter (HOSPITAL_COMMUNITY)
Admission: RE | Admit: 2016-12-03 | Discharge: 2016-12-03 | Disposition: A | Discharge status: Home / Self Care | Payer: Medicare Other | Source: Ambulatory Visit | Attending: Internal Medicine | Admitting: Internal Medicine

## 2016-12-03 VITALS — Wt 216.7 lb

## 2016-12-03 DIAGNOSIS — R0602 Shortness of breath: Secondary | ICD-10-CM | POA: Diagnosis not present

## 2016-12-03 NOTE — Progress Notes (Signed)
Daily Session Note  Patient Details  Name: Brittany Robertson MRN: [id number] Date of Birth: [birth year] Referring Provider:   April Manson Pulmonary Rehab Walk Test from 09/08/2016 in [place]  Referring Provider  dr. Chase Caller      Encounter Date: 12/03/2016  Check In:     Session Check In - 12/03/16 1030      Check-In   [facility]   Staff Present Rosebud Poles, RN, BSN;Molly diVincenzo, MS, ACSM RCEP, Exercise Physiologist;Lisa Ysidro Evert, RN;Portia Rollene Rotunda, RN, BSN   Supervising physician immediately available to respond to emergencies [facility] immediately available   Physician(s) Dr. Algis Liming   Medication changes reported     No   Fall or balance concerns reported    No   Warm-up and Cool-down Performed as group-led instruction   Resistance Training Performed Yes   VAD Patient? No     Pain Assessment   Currently in Pain? No/denies   Multiple Pain Sites No      Capillary Blood Glucose: No results found for this or any previous visit (from the past 24 hour(s)).      Exercise Prescription Changes - 12/03/16 1200      Response to Exercise   Blood Pressure (Admit) 112/64   Blood Pressure (Exercise) 130/64   Blood Pressure (Exit) 98/72   Heart Rate (Admit) 60 bpm   Heart Rate (Exercise) 92 bpm   Heart Rate (Exit) 72 bpm   Oxygen Saturation (Admit) 100 %   Oxygen Saturation (Exercise) 97 %   Oxygen Saturation (Exit) 99 %   Rating of Perceived Exertion (Exercise) 12   Perceived Dyspnea (Exercise) 1.5   Duration Progress to 45 minutes of aerobic exercise without signs/symptoms of physical distress   Intensity THRR unchanged     Progression   Progression Continue to progress workloads to maintain intensity without signs/symptoms of physical distress.     Resistance Training   Training Prescription Yes   Weight orange bands   Reps 10-12  10 minutes of strength training     Interval Training   Interval Training No     Recumbant Bike   Level 5   Minutes 17     NuStep   Level 6   Minutes 17   METs 2.8     Goals Met:  Exercise tolerated well Strength training completed today  Goals Unmet:  Not Applicable  Comments: Service time is from 1030 to 1200    Dr. Rush Farmer is Medical Director for Pulmonary Rehab at [facility].

## 2016-12-04 ENCOUNTER — Encounter (HOSPITAL_BASED_OUTPATIENT_CLINIC_OR_DEPARTMENT_OTHER): Payer: Medicare Other

## 2016-12-08 ENCOUNTER — Encounter (HOSPITAL_COMMUNITY)
Admission: RE | Admit: 2016-12-08 | Discharge: 2016-12-08 | Disposition: A | Discharge status: Home / Self Care | Payer: Medicare Other | Source: Ambulatory Visit | Attending: Internal Medicine | Admitting: Internal Medicine

## 2016-12-08 VITALS — Wt 218.3 lb

## 2016-12-08 DIAGNOSIS — R0602 Shortness of breath: Secondary | ICD-10-CM | POA: Diagnosis not present

## 2016-12-08 NOTE — Progress Notes (Signed)
Daily Session Note  Patient Details  Name: Brittany Robertson MRN: [id number] Date of Birth: [birth year] Referring Provider:   April Manson Pulmonary Rehab Walk Test from 09/08/2016 in [place]  Referring Provider  dr. Chase Caller      Encounter Date: 12/08/2016  Check In:     Session Check In - 12/08/16 1030      Check-In   [facility]   Staff Present Rosebud Poles, RN, BSN;Molly diVincenzo, MS, ACSM RCEP, Exercise Physiologist;Lisa Ysidro Evert, RN;Oluwaseyi Raffel Rollene Rotunda, RN, BSN   Supervising physician immediately available to respond to emergencies [facility] immediately available   Physician(s) Dr. Candiss Norse   Medication changes reported     No   Fall or balance concerns reported    No   Warm-up and Cool-down Performed as group-led instruction   Resistance Training Performed Yes   VAD Patient? No     Pain Assessment   Currently in Pain? No/denies   Multiple Pain Sites No      Capillary Blood Glucose: No results found for this or any previous visit (from the past 24 hour(s)).      Exercise Prescription Changes - 12/08/16 1215      Response to Exercise   Blood Pressure (Admit) 100/64   Blood Pressure (Exercise) 128/54   Blood Pressure (Exit) 106/60   Heart Rate (Admit) 68 bpm   Heart Rate (Exercise) 97 bpm   Heart Rate (Exit) 78 bpm   Oxygen Saturation (Admit) 99 %   Oxygen Saturation (Exercise) 94 %   Oxygen Saturation (Exit) 96 %   Rating of Perceived Exertion (Exercise) 14   Perceived Dyspnea (Exercise) 3   Duration Progress to 45 minutes of aerobic exercise without signs/symptoms of physical distress   Intensity THRR unchanged     Progression   Progression Continue to progress workloads to maintain intensity without signs/symptoms of physical distress.     Resistance Training   Training Prescription Yes   Weight orange bands   Reps 10-12  10 minutes of strength training     Interval Training   Interval  Training No     Recumbant Bike   Level 4.6  decrease wl for SOB today   Minutes 17     NuStep   Level 6   Minutes 17   METs 2.9     Goals Met:  Independence with exercise equipment Improved SOB with ADL's Using PLB without cueing & demonstrates good technique No report of cardiac concerns or symptoms Strength training completed today  Goals Unmet:  PD Not Applicable  Comments: Service time is from 1030 to 1205   Dr. Rush Farmer is Medical Director for Pulmonary Rehab at [facility].

## 2016-12-10 ENCOUNTER — Encounter (HOSPITAL_COMMUNITY)
Admission: RE | Admit: 2016-12-10 | Discharge: 2016-12-10 | Disposition: A | Discharge status: Home / Self Care | Payer: Medicare Other | Source: Ambulatory Visit | Attending: Internal Medicine | Admitting: Internal Medicine

## 2016-12-10 VITALS — Wt 217.2 lb

## 2016-12-10 DIAGNOSIS — R0602 Shortness of breath: Secondary | ICD-10-CM

## 2016-12-10 NOTE — Progress Notes (Signed)
Daily Session Note  Patient Details  Name: MILANIE ROSENFIELD MRN: [id number] Date of Birth: [birth year] Referring Provider:   April Manson Pulmonary Rehab Walk Test from 09/08/2016 in [place]  Referring Provider  dr. Chase Caller      Encounter Date: 12/10/2016  Check In:     Session Check In - 12/10/16 1104      Check-In   [facility]   Staff Present Su Hilt, MS, ACSM RCEP, Exercise Physiologist;Ryenne Lynam Rollene Rotunda, RN, BSN      Capillary Blood Glucose: No results found for this or any previous visit (from the past 24 hour(s)).      Exercise Prescription Changes - 12/10/16 1236      Response to Exercise   Blood Pressure (Admit) 108/62   Blood Pressure (Exercise) 114/72   Blood Pressure (Exit) 90/62   Heart Rate (Admit) 62 bpm   Heart Rate (Exercise) 97 bpm   Heart Rate (Exit) 73 bpm   Oxygen Saturation (Admit) 100 %   Oxygen Saturation (Exercise) 100 %   Oxygen Saturation (Exit) 97 %   Rating of Perceived Exertion (Exercise) 12   Perceived Dyspnea (Exercise) 1.5   Duration Progress to 45 minutes of aerobic exercise without signs/symptoms of physical distress   Intensity THRR unchanged     Progression   Progression Continue to progress workloads to maintain intensity without signs/symptoms of physical distress.     Resistance Training   Training Prescription Yes   Weight orange bands   Reps 10-12  10 minutes of strength training     Interval Training   Interval Training No     NuStep   Level 6   Minutes 17   METs 2.8     Track   Laps 14   Minutes 17     Goals Met:  Independence with exercise equipment Improved SOB with ADL's Using PLB without cueing & demonstrates good technique Exercise tolerated well Personal goals reviewed No report of cardiac concerns or symptoms Strength training completed today  Goals Unmet:  Not Applicable  Comments: Service time is from 1030 to 1215   Dr.  Rush Farmer is Medical Director for Pulmonary Rehab at [facility].

## 2016-12-15 ENCOUNTER — Encounter (HOSPITAL_COMMUNITY)
Admission: RE | Admit: 2016-12-15 | Discharge: 2016-12-15 | Disposition: A | Discharge status: Home / Self Care | Payer: Medicare Other | Source: Ambulatory Visit | Attending: Internal Medicine | Admitting: Internal Medicine

## 2016-12-15 VITALS — Wt 218.3 lb

## 2016-12-15 DIAGNOSIS — R0602 Shortness of breath: Secondary | ICD-10-CM | POA: Diagnosis not present

## 2016-12-15 NOTE — Progress Notes (Signed)
Daily Session Note  Patient Details  Name: Brittany Robertson MRN: [id number] Date of Birth: [birth year] Referring Provider:   April Manson Pulmonary Rehab Walk Test from 09/08/2016 in [place]  Referring Provider  dr. Chase Caller      Encounter Date: 12/15/2016  Check In:     Session Check In - 12/15/16 1016      Check-In   [facility]   Staff Present Rosebud Poles, RN, BSN;Molly diVincenzo, MS, ACSM RCEP, Exercise Physiologist;Nariyah Osias Ysidro Evert, RN;Portia Rollene Rotunda, RN, BSN   Supervising physician immediately available to respond to emergencies [facility] immediately available   Physician(s) Dr. Posey Pronto   Medication changes reported     No   Fall or balance concerns reported    No   Tobacco Cessation No Change   Warm-up and Cool-down Performed as group-led instruction   Resistance Training Performed Yes   VAD Patient? No     Pain Assessment   Currently in Pain? No/denies   Multiple Pain Sites No      Capillary Blood Glucose: No results found for this or any previous visit (from the past 24 hour(s)).      Exercise Prescription Changes - 12/15/16 1200      Response to Exercise   Blood Pressure (Admit) 126/72   Blood Pressure (Exercise) 140/64   Blood Pressure (Exit) 100/60   Heart Rate (Admit) 61 bpm   Heart Rate (Exercise) 92 bpm   Heart Rate (Exit) 78 bpm   Oxygen Saturation (Admit) 98 %   Oxygen Saturation (Exercise) 97 %   Oxygen Saturation (Exit) 97 %   Rating of Perceived Exertion (Exercise) 13   Perceived Dyspnea (Exercise) 3   Duration Continue with 45 min of aerobic exercise without signs/symptoms of physical distress.   Intensity THRR unchanged     Progression   Progression Continue to progress workloads to maintain intensity without signs/symptoms of physical distress.     Resistance Training   Training Prescription No   Weight orange bands   Reps 10-15   Time 10 Minutes     Interval Training    Interval Training No     Recumbant Bike   Level 5   Minutes 17     NuStep   Level 6   Minutes 17   METs 3     Track   Laps 13   Minutes 17      History  Smoking Status  . Never Smoker  Smokeless Tobacco  . Never Used    Goals Met:  Exercise tolerated well No report of cardiac concerns or symptoms Strength training completed today  Goals Unmet:  Not Applicable  Comments: Service time is from 1030 to 1215    Dr. Rush Farmer is Medical Director for Pulmonary Rehab at [facility].

## 2016-12-17 ENCOUNTER — Encounter (HOSPITAL_COMMUNITY)
Admission: RE | Admit: 2016-12-17 | Discharge: 2016-12-17 | Disposition: A | Discharge status: Home / Self Care | Payer: Medicare Other | Source: Ambulatory Visit | Attending: Internal Medicine | Admitting: Internal Medicine

## 2016-12-17 VITALS — Wt 215.6 lb

## 2016-12-17 DIAGNOSIS — R0602 Shortness of breath: Secondary | ICD-10-CM | POA: Insufficient documentation

## 2016-12-17 NOTE — Progress Notes (Signed)
Daily Session Note  Patient Details  Name: Brittany Robertson MRN: [id number] Date of Birth: 1940-12-03 Referring Provider:   April Manson Pulmonary Rehab Walk Test from 09/08/2016 in [place]  Referring Provider  dr. Chase Caller      Encounter Date: 12/17/2016  Check In:     Session Check In - 12/17/16 1030      Check-In   Location [facility]   Staff Present Rosebud Poles, RN, BSN;Molly diVincenzo, MS, ACSM RCEP, Exercise Physiologist;Lisa Ysidro Evert, RN;Fleta Borgeson Rollene Rotunda, RN, BSN   Supervising physician immediately available to respond to emergencies [facility] immediately available   Physician(s) Dr. Sloan Leiter   Medication changes reported     No   Fall or balance concerns reported    No   Tobacco Cessation No Change   Warm-up and Cool-down Performed as group-led instruction   Resistance Training Performed Yes   VAD Patient? No     Pain Assessment   Currently in Pain? No/denies   Multiple Pain Sites No      Capillary Blood Glucose: No results found for this or any previous visit (from the past 24 hour(s)).      Exercise Prescription Changes - 12/17/16 1237      Response to Exercise   Blood Pressure (Admit) 104/70   Blood Pressure (Exercise) 100/66   Blood Pressure (Exit) 96/60   Heart Rate (Admit) 75 bpm   Heart Rate (Exercise) 120 bpm   Heart Rate (Exit) 93 bpm   Oxygen Saturation (Admit) 97 %   Oxygen Saturation (Exercise) 97 %   Oxygen Saturation (Exit) 97 %   Rating of Perceived Exertion (Exercise) 13   Perceived Dyspnea (Exercise) 2.5   Duration Continue with 45 min of aerobic exercise without signs/symptoms of physical distress.   Intensity THRR unchanged     Progression   Progression Continue to progress workloads to maintain intensity without signs/symptoms of physical distress.     Resistance Training   Training Prescription No   Weight orange bands   Reps 10-15   Time 10 Minutes     Interval  Training   Interval Training No     Recumbant Bike   Level 5   Minutes 17     Track   Laps 13   Minutes 17      History  Smoking Status  . Never Smoker  Smokeless Tobacco  . Never Used    Goals Met:  Independence with exercise equipment Improved SOB with ADL's Using PLB without cueing & demonstrates good technique Exercise tolerated well No report of cardiac concerns or symptoms Strength training completed today  Goals Unmet:  Not Applicable  Comments: Service time is from 1030 to 1220   Dr. Rush Farmer is Medical Director for Pulmonary Rehab at [facility].

## 2016-12-22 ENCOUNTER — Encounter (HOSPITAL_COMMUNITY)
Admission: RE | Admit: 2016-12-22 | Discharge: 2016-12-22 | Disposition: A | Discharge status: Home / Self Care | Payer: Medicare Other | Source: Ambulatory Visit | Attending: Internal Medicine | Admitting: Internal Medicine

## 2016-12-22 VITALS — Wt 218.5 lb

## 2016-12-22 DIAGNOSIS — R0602 Shortness of breath: Secondary | ICD-10-CM

## 2016-12-22 NOTE — Progress Notes (Signed)
Daily Session Note  Patient Details  Name: Brittany Robertson MRN: [id number] Date of Birth: Dec 28, 1940 Referring Provider:   April Manson Pulmonary Rehab Walk Test from 09/08/2016 in [place]  Referring Provider  dr. Chase Caller      Encounter Date: 12/22/2016  Check In:     Session Check In - 12/22/16 1030      Check-In   Location [facility]   Staff Present Rosebud Poles, RN, BSN;Molly diVincenzo, MS, ACSM RCEP, Exercise Physiologist;Lisa Ysidro Evert, RN;Iria Jamerson Rollene Rotunda, RN, BSN   Supervising physician immediately available to respond to emergencies [facility] immediately available   Physician(s) Dr. Candiss Norse   Medication changes reported     No   Fall or balance concerns reported    No   Tobacco Cessation No Change   Warm-up and Cool-down Performed as group-led instruction   Resistance Training Performed Yes   VAD Patient? No     Pain Assessment   Currently in Pain? No/denies   Multiple Pain Sites No      Capillary Blood Glucose: No results found for this or any previous visit (from the past 24 hour(s)).      Exercise Prescription Changes - 12/22/16 1223      Response to Exercise   Blood Pressure (Admit) 110/64   Blood Pressure (Exercise) 146/70   Blood Pressure (Exit) 100/68   Heart Rate (Admit) 52 bpm   Heart Rate (Exercise) 118 bpm   Heart Rate (Exit) 80 bpm   Oxygen Saturation (Admit) 99 %   Oxygen Saturation (Exercise) 98 %   Oxygen Saturation (Exit) 96 %   Rating of Perceived Exertion (Exercise) 12.5   Perceived Dyspnea (Exercise) 1.5   Duration Continue with 45 min of aerobic exercise without signs/symptoms of physical distress.   Intensity THRR unchanged     Progression   Progression Continue to progress workloads to maintain intensity without signs/symptoms of physical distress.     Resistance Training   Training Prescription No   Weight orange bands   Reps 10-15   Time 10 Minutes     Interval  Training   Interval Training No     Recumbant Bike   Level 5   Minutes 17     NuStep   Level 6   Minutes 17     Track   Laps 13   Minutes 17      History  Smoking Status  . Never Smoker  Smokeless Tobacco  . Never Used    Goals Met:  Independence with exercise equipment Improved SOB with ADL's Using PLB without cueing & demonstrates good technique Exercise tolerated well No report of cardiac concerns or symptoms Strength training completed today  Goals Unmet:  Not Applicable  Comments: Service time is from 1030 to 1210   Dr. Rush Farmer is Medical Director for Pulmonary Rehab at [facility].

## 2016-12-24 ENCOUNTER — Encounter (HOSPITAL_COMMUNITY)
Admission: RE | Admit: 2016-12-24 | Discharge: 2016-12-24 | Disposition: A | Discharge status: Home / Self Care | Payer: Medicare Other | Source: Ambulatory Visit | Attending: Internal Medicine | Admitting: Internal Medicine

## 2016-12-24 VITALS — Wt 217.6 lb

## 2016-12-24 DIAGNOSIS — R0602 Shortness of breath: Secondary | ICD-10-CM

## 2016-12-24 NOTE — Progress Notes (Signed)
Daily Session Note  Patient Details  Name: Brittany Robertson MRN: [id number] Date of Birth: [birth year] Referring Provider:   April Manson Pulmonary Rehab Walk Test from 09/08/2016 in [place]  Referring Provider  dr. Chase Caller      Encounter Date: 12/24/2016  Check In:     Session Check In - 12/24/16 1030      Check-In   [facility]   Staff Present Rosebud Poles, RN, BSN;Molly diVincenzo, MS, ACSM RCEP, Exercise Physiologist;Portia Rollene Rotunda, RN, BSN   Supervising physician immediately available to respond to emergencies [facility] immediately available   Physician(s) Dr. Tana Coast   Medication changes reported     No   Fall or balance concerns reported    No   Tobacco Cessation No Change   Warm-up and Cool-down Performed as group-led instruction   Resistance Training Performed Yes   VAD Patient? No     Pain Assessment   Currently in Pain? No/denies   Multiple Pain Sites No      Capillary Blood Glucose: No results found for this or any previous visit (from the past 24 hour(s)).      Exercise Prescription Changes - 12/24/16 1200      Response to Exercise   Blood Pressure (Admit) 102/60   Blood Pressure (Exercise) 146/80   Blood Pressure (Exit) 100/60   Heart Rate (Admit) 70 bpm   Heart Rate (Exercise) 114 bpm   Heart Rate (Exit) 87 bpm   Oxygen Saturation (Admit) 100 %   Oxygen Saturation (Exercise) 94 %   Oxygen Saturation (Exit) 98 %   Rating of Perceived Exertion (Exercise) 12   Perceived Dyspnea (Exercise) 1   Duration Continue with 45 min of aerobic exercise without signs/symptoms of physical distress.   Intensity THRR unchanged     Progression   Progression Continue to progress workloads to maintain intensity without signs/symptoms of physical distress.     Resistance Training   Training Prescription No   Weight orange bands   Reps 10-15   Time 10 Minutes     Interval Training   Interval  Training No     NuStep   Level 6   Minutes 17   METs 3.1     Track   Laps 13   Minutes 17      History  Smoking Status  . Never Smoker  Smokeless Tobacco  . Never Used    Goals Met:  Exercise tolerated well Strength training completed today  Goals Unmet:  Not Applicable  Comments: Service time is from 1030 to 1230    Dr. Rush Farmer is Medical Director for Pulmonary Rehab at [facility].

## 2016-12-28 ENCOUNTER — Ambulatory Visit: Payer: Medicare Other | Admitting: Pulmonary Disease

## 2016-12-29 ENCOUNTER — Encounter (HOSPITAL_COMMUNITY)
Admission: RE | Admit: 2016-12-29 | Discharge: 2016-12-29 | Disposition: A | Discharge status: Home / Self Care | Payer: Medicare Other | Source: Ambulatory Visit | Attending: Internal Medicine | Admitting: Internal Medicine

## 2016-12-29 VITALS — Wt 217.8 lb

## 2016-12-29 DIAGNOSIS — R0602 Shortness of breath: Secondary | ICD-10-CM | POA: Diagnosis not present

## 2016-12-29 NOTE — Progress Notes (Signed)
Pulmonary Individual Treatment Plan  Patient Details  Name: Brittany Robertson MRN: [id number] Date of Birth: 1941-05-02 Referring Provider:   April Manson Pulmonary Rehab Walk Test from 09/08/2016 in [facility]  Referring Provider  dr. Chase Caller      Initial Encounter Date:  Flowsheet Row Pulmonary Rehab Walk Test from 09/08/2016 in [facility]  Date  09/08/16  Referring Provider  dr. Chase Caller      Visit Diagnosis: SOB (shortness of breath)  Patient's Home Medications on Admission:   Current Outpatient Prescriptions:  .  acetaminophen (TYLENOL) 500 MG tablet, Take 500-1,000 mg by mouth daily as needed for headache., Disp: , Rfl:  .  aspirin 81 MG tablet, Take 81 mg by mouth every evening. , Disp: , Rfl:  .  atorvastatin (LIPITOR) 20 MG tablet, Take 20 mg by mouth at bedtime. Brand Name Only, Disp: , Rfl:  .  Calcium Carbonate-Vitamin D (CALCIUM + D PO), Take 1 tablet by mouth every other day. , Disp: , Rfl:  .  cetirizine (ZYRTEC) 10 MG tablet, Take 10 mg by mouth every morning.  , Disp: , Rfl:  .  cholecalciferol (VITAMIN D) 400 UNITS TABS, Take 400 Units by mouth every other day. , Disp: , Rfl:  .  Coenzyme Q10 Liposomal 100 MG/ML LIQD, Take 100 mg by mouth every evening., Disp: , Rfl:  .  denosumab (PROLIA) 60 MG/ML SOLN injection, Inject 60 mg into the skin every 6 (six) months. Administer in upper arm, thigh, or abdomen, Disp: , Rfl:  .  docusate sodium (COLACE) 100 MG capsule, Take 100 mg by mouth 2 (two) times daily. , Disp: , Rfl:  .  esomeprazole (NEXIUM) 40 MG capsule, Take 40 mg by mouth daily before breakfast.  , Disp: , Rfl:  .  ezetimibe (ZETIA) 10 MG tablet, Take 1 tablet (10 mg total) by mouth every evening., Disp: 90 tablet, Rfl: 3 .  fish oil-omega-3 fatty acids 1000 MG capsule, Take 1 g by mouth 2 (two) times daily.  , Disp: , Rfl:  .  furosemide (LASIX) 20 MG tablet, Take 20 mg by mouth daily as  needed for fluid or edema. , Disp: , Rfl:  .  meloxicam (MOBIC) 15 MG tablet, Take 15 mg by mouth every evening.  , Disp: , Rfl:  .  methocarbamol (ROBAXIN) 500 MG tablet, Take 500 mg by mouth 4 (four) times daily as needed for muscle spasms., Disp: , Rfl:  .  metoprolol succinate (TOPROL-XL) 25 MG 24 hr tablet, Take 1 tablet by mouth  daily, Disp: 90 tablet, Rfl: 0 .  mirabegron ER (MYRBETRIQ) 25 MG TB24 tablet, Take 25 mg by mouth every evening., Disp: , Rfl:  .  nitroGLYCERIN (NITROSTAT) 0.4 MG SL tablet, Place 1 tablet (0.4 mg total) under the tongue every 5 (five) minutes as needed. As needed for chest pain x 3 doses, Disp: 25 tablet, Rfl: 1 .  olmesartan (BENICAR) 40 MG tablet, Take 1 tablet (40 mg total) by mouth daily., Disp: 90 tablet, Rfl: 3 .  Polyethyl Glycol-Propyl Glycol (SYSTANE ULTRA OP), Apply 1 drop to eye 2 (two) times daily as needed (dry eyes)., Disp: , Rfl:  .  potassium chloride SA (K-DUR,KLOR-CON) 20 MEQ tablet, Take 1 tablet by mouth  every morning, Disp: 90 tablet, Rfl: 0 .  vitamin C (ASCORBIC ACID) 500 MG tablet, Take 500 mg by mouth 2 (two) times daily. , Disp: , Rfl:  .  vitamin E 400 UNIT capsule, Take 400 Units by mouth at bedtime. , Disp: , Rfl:   Past Medical History: Past Medical History:  Diagnosis Date  . Bruises easily   . Chronic diastolic CHF (congestive heart failure) ([place])   . Complication of anesthesia    "takes a long time towake up"  . Coronary artery disease    PCI 2006, repeat cath 30% mid LAD distal to stent otherwise normal coronary arteries, cath 08/2013 with patent LAD stent and otherwise normal coronary arteries with diastolic dysfunction  . DDD (degenerative disc disease), cervical   . Degenerative disk disease   . DOE (dyspnea on exertion)    chronic secondary to obesity, deconditiong and chronic diastolic CHF  . GERD (gastroesophageal reflux disease)   . Hyperlipidemia   . Hypertension   . OA (osteoarthritis)   . Obesity   .  Osteopenia   . PONV (postoperative nausea and vomiting)    Motion sickness when move out of PACU to room  . PVC's (premature ventricular contractions)   . SOB (shortness of breath) 07/13/2016    Tobacco Use: History  Smoking Status  . Never Smoker  Smokeless Tobacco  . Never Used    Labs: Recent Review Flowsheet Data    Labs for ITP Cardiac and Pulmonary Rehab Latest Ref Rng & Units 12/07/2014 04/24/2016 08/31/2016 08/31/2016 08/31/2016   Cholestrol 125 - 200 mg/dL 119 120(L) - - -   LDLCALC <130 mg/dL 56 36 - - -   HDL >=46 mg/dL 41.10 58 - - -   Trlycerides <150 mg/dL 109.0 130 - - -   PHART 7.350 - 7.450 - - 7.412 - -   PCO2ART 32.0 - 48.0 mmHg - - 36.0 - -   HCO3 20.0 - 28.0 mmol/L - - 22.9 23.3 22.2   TCO2 0 - 100 mmol/L - - _0 ACIDBASEDEF 0.0 - 2.0 mmol/L - - 1.0 1.0 3.0(H)   O2SAT % - - 93.0 68.0 65.0      Capillary Blood Glucose: No results found for: GLUCAP   ADL UCSD:   Pulmonary Function Assessment:     Pulmonary Function Assessment - 09/07/16 0943      Breath   Bilateral Breath Sounds Clear   Shortness of Breath Yes;Limiting activity      Exercise Target Goals:    Exercise Program Goal: Individual exercise prescription set with THRR, safety & activity barriers. Participant demonstrates ability to understand and report RPE using BORG scale, to self-measure pulse accurately, and to acknowledge the importance of the exercise prescription.  Exercise Prescription Goal: Starting with aerobic activity 30 plus minutes a day, 3 days per week for initial exercise prescription. Provide home exercise prescription and guidelines that participant acknowledges understanding prior to discharge.  Activity Barriers & Risk Stratification:     Activity Barriers & Cardiac Risk Stratification - 09/07/16 0939      Activity Barriers & Cardiac Risk Stratification   Activity Barriers Right Knee Replacement;Left Knee Replacement;Arthritis;Back Problems;Shortness  of Breath  degenerative disc disease      6 Minute Walk:     6 Minute Walk    Row Name 09/08/16 1633         6 Minute Walk   Phase Initial     Distance 1020 feet     Walk Time 6 minutes     # of Rest Breaks 0     MPH 1.93     METS 2.45  RPE 14     Perceived Dyspnea  1     Symptoms No     Resting HR 66 bpm     Resting BP 119/80     Max Ex. HR 102 bpm     Max Ex. BP 125/76       Interval HR   Baseline HR 66     1 Minute HR 81     2 Minute HR 94     3 Minute HR 99     4 Minute HR 101     5 Minute HR 98     6 Minute HR 102     2 Minute Post HR 68     Interval Heart Rate? Yes       Interval Oxygen   Interval Oxygen? Yes     Baseline Oxygen Saturation % 97 %     Baseline Liters of Oxygen 0 L     1 Minute Oxygen Saturation % 96 %     1 Minute Liters of Oxygen 0 L     2 Minute Oxygen Saturation % 99 %     2 Minute Liters of Oxygen 0 L     3 Minute Oxygen Saturation % 96 %     3 Minute Liters of Oxygen 0 L     4 Minute Oxygen Saturation % 91 %     4 Minute Liters of Oxygen 0 L     5 Minute Oxygen Saturation % 90 %     5 Minute Liters of Oxygen 0 L     6 Minute Oxygen Saturation % 90 %     6 Minute Liters of Oxygen 0 L     2 Minute Post Oxygen Saturation % 99 %     2 Minute Post Liters of Oxygen 0 L        Oxygen Initial Assessment:     Oxygen Initial Assessment - 12/28/16 1200      Home Oxygen   Home Oxygen Device None   Sleep Oxygen Prescription None   Home Exercise Oxygen Prescription None   Home at Rest Exercise Oxygen Prescription None     Initial 6 min Walk   Oxygen Used None     Program Oxygen Prescription   Program Oxygen Prescription None      Oxygen Re-Evaluation:   Oxygen Discharge (Final Oxygen Re-Evaluation):   Initial Exercise Prescription:     Initial Exercise Prescription - 09/08/16 1600      Date of Initial Exercise RX and Referring Provider   Date 09/08/16   Referring Provider dr. Chase Caller     NuStep   Level 2    Minutes 17   METs 1.5     Arm Ergometer   Level 2   Minutes 17     Track   Laps 5   Minutes 17     Prescription Details   Frequency (times per week) 2   Duration Progress to 45 minutes of aerobic exercise without signs/symptoms of physical distress     Intensity   THRR 40-80% of Max Heartrate 57-114   Ratings of Perceived Exertion 11-13   Perceived Dyspnea 0-4     Progression   Progression Continue progressive overload as per policy without signs/symptoms or physical distress.     Resistance Training   Training Prescription Yes   Weight orange bands   Reps 10-12      Perform Capillary Blood Glucose checks as needed.  Exercise Prescription  Changes:     Exercise Prescription Changes    Row Name 09/29/16 1200 10/01/16 1200 10/06/16 1200 10/08/16 1200 10/13/16 1227     Response to Exercise   Blood Pressure (Admit) 120/70 168/104 130/80 106/60 112/60   Blood Pressure (Exercise) 120/80 142/80 120/80 114/70 112/66   Blood Pressure (Exit) 110/79 104/72 110/66 104/60 100/62   Heart Rate (Admit) 58 bpm 60 bpm 59 bpm 56 bpm 59 bpm   Heart Rate (Exercise) 82 bpm 85 bpm 83 bpm 97 bpm 84 bpm   Heart Rate (Exit) 75 bpm 72 bpm 70 bpm 71 bpm 69 bpm   Oxygen Saturation (Admit) 96 % 98 % 100 % 100 % 100 %   Oxygen Saturation (Exercise) 100 % 92 % 96 % 94 % 94 %   Oxygen Saturation (Exit) 95 % 98 % 97 % 98 % 96 %   Rating of Perceived Exertion (Exercise) _0 Perceived Dyspnea (Exercise) _1 Duration Progress to 45 minutes of aerobic exercise without signs/symptoms of physical distress Progress to 45 minutes of aerobic exercise without signs/symptoms of physical distress Progress to 45 minutes of aerobic exercise without signs/symptoms of physical distress Progress to 45 minutes of aerobic exercise without signs/symptoms of physical distress Progress to 45 minutes of aerobic exercise without signs/symptoms of physical distress   Intensity -  40-80% of HRR -   40-80% of HRR -  40-80% of HRR THRR unchanged THRR unchanged     Progression   Progression Continue to progress workloads to maintain intensity without signs/symptoms of physical distress. Continue to progress workloads to maintain intensity without signs/symptoms of physical distress. Continue to progress workloads to maintain intensity without signs/symptoms of physical distress. Continue to progress workloads to maintain intensity without signs/symptoms of physical distress. Continue to progress workloads to maintain intensity without signs/symptoms of physical distress.     Resistance Training   Training Prescription _2    Weight _3    Reps 10-12  10 minutes of strength training 10-12  10 minutes of strength training 10-12  10 minutes of strength training 10-12  10 minutes of strength training 10-12  10 minutes of strength training     Recumbant Bike   Level  -  - 2 3 2.5  decreased workload r/t sore legs   Minutes  -  - _4 NuStep   Level _5 - 3   Minutes _6 - 17   METs 1.8 1.8 1.8  - 1.7     Arm Ergometer   Level 1  Workload redued due to pt c/o of shoulder pain -  Workload redued due to pt c/o of shoulder pain -  Workload redued due to pt c/o of shoulder pain  -  -   Minutes 17  -  -  -  -     Track   Laps _7 Minutes _8 Exercise Review   Progression  -  - Yes Yes  -   Row Name 10/15/16 1200 11/03/16 1200 11/10/16 1205 11/12/16 1200 11/17/16 1200     Response to Exercise   Blood Pressure (Admit) 136/74 118/68 114/74 112/70 102/54   Blood Pressure (Exercise) 110/64 100/60 132/88 106/60 122/70   Blood Pressure (Exit) 122/80 108/64  110/60 114/61 100/60   Heart Rate (Admit) 57 bpm 59 bpm 59 bpm 66 bpm 67 bpm   Heart Rate (Exercise) 88 bpm 89 bpm 94 bpm 104 bpm 109 bpm   Heart Rate (Exit) 73 bpm 67 bpm 79 bpm 74 bpm 77 bpm   Oxygen  Saturation (Admit) 96 % 99 % 100 % 98 % 99 %   Oxygen Saturation (Exercise) 96 % 100 % 97 % 99 % 97 %   Oxygen Saturation (Exit) 99 % 95 % 97 % 95 % 96 %   Rating of Perceived Exertion (Exercise) _0 Perceived Dyspnea (Exercise) _1 2.5 2   Duration Progress to 45 minutes of aerobic exercise without signs/symptoms of physical distress Progress to 45 minutes of aerobic exercise without signs/symptoms of physical distress Progress to 45 minutes of aerobic exercise without signs/symptoms of physical distress Progress to 45 minutes of aerobic exercise without signs/symptoms of physical distress Progress to 45 minutes of aerobic exercise without signs/symptoms of physical distress   Intensity _2      Progression   Progression Continue to progress workloads to maintain intensity without signs/symptoms of physical distress. Continue to progress workloads to maintain intensity without signs/symptoms of physical distress. Continue to progress workloads to maintain intensity without signs/symptoms of physical distress. Continue to progress workloads to maintain intensity without signs/symptoms of physical distress. Continue to progress workloads to maintain intensity without signs/symptoms of physical distress.     Resistance Training   Training Prescription _3    Weight _4    Reps 10-12  10 minutes of strength training 10-12  10 minutes of strength training 10-12  10 minutes of strength training 10-12  10 minutes of strength training 10-12  10 minutes of strength training     Interval Training   Interval Training  - No No No No     Recumbant Bike   Level  - 3 4  - 4   Minutes  - 17 17  - 17     NuStep   Level _5 Minutes _6 METs 2.1 2.1 2.2 2.2 2.4     Track   Laps _7 Minutes _8 Home Exercise Plan   Plans to continue exercise at  -  -  -  - Home  [employer] [facility] Home   Frequency  -  -  -  - Add 3 additional days to program exercise sessions.     Exercise Review   Progression  -  - Yes Yes Yes   Row Name 11/19/16 1200 11/24/16 1200 11/26/16 1200 12/01/16 1200 12/03/16 1200     Response to Exercise   Blood Pressure (Admit) 102/64 120/66 118/66 104/70 112/64   Blood Pressure (Exercise) 150/70 112/80 136/70 128/68 130/64   Blood Pressure (Exit) 96/66 96/60 107/59 100/50 98/72   Heart Rate (Admit) 67 bpm 69 bpm 73 bpm 58 bpm 60 bpm   Heart Rate (Exercise) 98 bpm 111 bpm 110 bpm 106 bpm 92 bpm   Heart Rate (Exit) 76 bpm 88 bpm 85 bpm 85 bpm 72 bpm   Oxygen Saturation (Admit) 99 % 98 % 100 % 98 % 100 %   Oxygen Saturation (Exercise) 98 % 97 %  97 % 97 % 97 %   Oxygen Saturation (Exit) 98 % 97 % 97 % 98 % 99 %   Rating of Perceived Exertion (Exercise) _0 Perceived Dyspnea (Exercise) _1 2.5 1.5   Duration Progress to 45 minutes of aerobic exercise without signs/symptoms of physical distress Progress to 45 minutes of aerobic exercise without signs/symptoms of physical distress Progress to 45 minutes of aerobic exercise without signs/symptoms of physical distress Progress to 45 minutes of aerobic exercise without signs/symptoms of physical distress Progress to 45 minutes of aerobic exercise without signs/symptoms of physical distress   Intensity _2      Progression   Progression Continue to progress workloads to maintain intensity without signs/symptoms of physical distress. Continue to progress workloads to maintain intensity without signs/symptoms of physical distress. Continue to progress workloads to maintain intensity without signs/symptoms of physical distress. Continue to progress workloads to maintain intensity without signs/symptoms of physical distress. Continue to progress workloads  to maintain intensity without signs/symptoms of physical distress.     Resistance Training   Training Prescription _3    Weight orange bands orange bands orange bands  orange bands orange bands orange bands   Reps 10-12  10 minuites of strength training 10-12  10 minutes of strength training 10-12  10 minutes of strength training 10-12  10 minutes of strength training 10-12  10 minutes of strength training     Interval Training   Interval Training _4      Recumbant Bike   Level 5 5  - 4.5 5   Minutes 17 17  - 17 17     NuStep   Level  - _5 Minutes  - _6 METs  - 2.7 2.8 2.1 2.8     Track   Laps  - 15 13 14.5  -   Minutes  - _7 -     Exercise Review   Progression Yes Yes  -  -  -   Row Name 12/08/16 1215 12/10/16 1236 12/15/16 1200 12/17/16 1237 12/22/16 1223     Response to Exercise   Blood Pressure (Admit) 100/64 108/62 126/72 104/70 110/64   Blood Pressure (Exercise) 128/54 114/72 140/64 100/66 146/70   Blood Pressure (Exit) 106/60 90/62 100/60 96/60 100/68   Heart Rate (Admit) 68 bpm 62 bpm 61 bpm 75 bpm 52 bpm   Heart Rate (Exercise) 97 bpm 97 bpm 92 bpm 120 bpm 118 bpm   Heart Rate (Exit) 78 bpm 73 bpm 78 bpm 93 bpm 80 bpm   Oxygen Saturation (Admit) 99 % 100 % 98 % 97 % 99 %   Oxygen Saturation (Exercise) 94 % 100 % 97 % 97 % 98 %   Oxygen Saturation (Exit) 96 % 97 % 97 % 97 % 96 %   Rating of Perceived Exertion (Exercise) _8 12.5   Perceived Dyspnea (Exercise) 3 1.5 3 2.5 1.5   Duration Progress to 45 minutes of aerobic exercise without signs/symptoms of physical distress Progress to 45 minutes of aerobic exercise without signs/symptoms of physical distress Continue with 45 min of aerobic exercise without signs/symptoms of physical distress. Continue with 45 min of aerobic exercise without signs/symptoms of physical distress. Continue with 45 min of aerobic exercise without signs/symptoms of physical  distress.   Intensity  _0      Progression   Progression Continue to progress workloads to maintain intensity without signs/symptoms of physical distress. Continue to progress workloads to maintain intensity without signs/symptoms of physical distress. Continue to progress workloads to maintain intensity without signs/symptoms of physical distress. Continue to progress workloads to maintain intensity without signs/symptoms of physical distress. Continue to progress workloads to maintain intensity without signs/symptoms of physical distress.     Resistance Training   Training Prescription Yes Yes No No No   Weight _1    Reps 10-12  10 minutes of strength training 10-12  10 minutes of strength training 10-15 10-15 10-15   Time  -  - 10 Minutes 10 Minutes 10 Minutes     Interval Training   Interval Training _2      Recumbant Bike   Level 4.6  decrease wl for SOB today  - _3 Minutes 17  - _4 NuStep   Level _5 - 6   Minutes _6 - 17   METs 2.9 2.8 3  -  -     Track   Laps  - _7 Minutes  - _8 Row Name 12/24/16 1200 12/29/16 1200           Response to Exercise   Blood Pressure (Admit) 102/60 108/68      Blood Pressure (Exercise) 146/80 108/64      Blood Pressure (Exit) 100/60 98/62      Heart Rate (Admit) 70 bpm 66 bpm      Heart Rate (Exercise) 114 bpm 116 bpm      Heart Rate (Exit) 87 bpm 87 bpm      Oxygen Saturation (Admit) 100 % 99 %      Oxygen Saturation (Exercise) 94 % 91 %      Oxygen Saturation (Exit) 98 % 97 %      Rating of Perceived Exertion (Exercise) 12 13      Perceived Dyspnea (Exercise) 1 3      Duration Continue with 45 min of aerobic exercise without signs/symptoms of physical distress. Continue with 45 min of aerobic exercise without signs/symptoms of physical distress.       Intensity THRR unchanged THRR unchanged        Progression   Progression Continue to progress workloads to maintain intensity without signs/symptoms of physical distress. Continue to progress workloads to maintain intensity without signs/symptoms of physical distress.        Resistance Training   Training Prescription No No      Weight orange bands orange bands      Reps 10-15 10-15      Time 10 Minutes 10 Minutes        Interval Training   Interval Training No No        Recumbant Bike   Level  - 5      Minutes  - 17        NuStep   Level 6 6      Minutes 17 17      METs 3.1 3.2        Track   Laps 13 13      Minutes 17 17         Exercise Comments:  Exercise Comments    Row Name 10/06/16 7001 11/09/16 1645 11/17/16 1220 11/30/16 1307     Exercise Comments Patient has only attended two sessions. Will cont. to monitor.  Patient has had extended leave of absence due to sickness. She has just returned to rehab. Will cont. to monitor and progress.  Home exercise completed Patient is progressing well. Up to 13 laps in 15 minutes. Open to exercise intensity changes. Attendance is great now that she isnt sick. Will cont. to monitor and progress.        Exercise Goals and Review:   Exercise Goals Re-Evaluation :     Exercise Goals Re-Evaluation    Row Name 12/28/16 1608             Exercise Goal Re-Evaluation   Exercise Goals Review Increase Physical Activity;Increase Strenth and Stamina       Comments Patient has done a great job in program. She is very motivated to push herself and increase workloads. She purchased pedometer to track steps. She is exercising at home. Will cont. to monitor and progress..       Expected Outcomes Through the exercise here at rehab the patient with increase physical capacity, strength, and stamina.          Discharge Exercise Prescription (Final Exercise Prescription Changes):     Exercise Prescription Changes - 12/29/16  1200      Response to Exercise   Blood Pressure (Admit) 108/68   Blood Pressure (Exercise) 108/64   Blood Pressure (Exit) 98/62   Heart Rate (Admit) 66 bpm   Heart Rate (Exercise) 116 bpm   Heart Rate (Exit) 87 bpm   Oxygen Saturation (Admit) 99 %   Oxygen Saturation (Exercise) 91 %   Oxygen Saturation (Exit) 97 %   Rating of Perceived Exertion (Exercise) 13   Perceived Dyspnea (Exercise) 3   Duration Continue with 45 min of aerobic exercise without signs/symptoms of physical distress.   Intensity THRR unchanged     Progression   Progression Continue to progress workloads to maintain intensity without signs/symptoms of physical distress.     Resistance Training   Training Prescription No   Weight orange bands   Reps 10-15   Time 10 Minutes     Interval Training   Interval Training No     Recumbant Bike   Level 5   Minutes 17     NuStep   Level 6   Minutes 17   METs 3.2     Track   Laps 13   Minutes 17      Nutrition:  Target Goals: Understanding of nutrition guidelines, daily intake of sodium <1517m, cholesterol <2072m calories 30% from fat and 7% or less from saturated fats, daily to have 5 or more servings of fruits and vegetables.  Biometrics:     Pre Biometrics - 09/07/16 0944      Pre Biometrics   Grip Strength 24 kg       Nutrition Therapy Plan and Nutrition Goals:     Nutrition Therapy & Goals - 11/16/16 1345      Nutrition Therapy   Diet Therapeutic Lifestyle Changes     Personal Nutrition Goals   Nutrition Goal Wt loss of 1-2 lb/week with a wt loss goal of 6-24 lb [facility] individualized specific dietary modifications aiming towards targeted core components such as weight, hypertension, lipid management, diabetes,  heart failure and other comorbidities.   Expected Outcomes Short Term Goal: Understand basic principles of dietary  content, such as calories, fat, sodium, cholesterol and nutrients.;Long Term Goal: Adherence to prescribed nutrition plan.      Nutrition Discharge: Rate Your Plate Scores:     Nutrition Assessments - 10/01/16 1141      Rate Your Plate Scores   Pre Score 58      Nutrition Goals Re-Evaluation:     Nutrition Goals Re-Evaluation    Row Name 11/19/16 1229             Goals   Nutrition Goal Wt loss of 1-2 lb/week with a wt loss goal of 6-24 lb at graduation from Pulmonary Rehab         Personal Goal #2 Re-Evaluation   Personal Goal #2 change from whole milk to 2% milk         Personal Goal #3 Re-Evaluation   Personal Goal #3 Increase consumption of whole grains to at least 1-2 servings per day          Nutrition Goals Discharge (Final Nutrition Goals Re-Evaluation):     Nutrition Goals Re-Evaluation - 11/19/16 1229      Goals   Nutrition Goal Wt loss of 1-2 lb/week with a wt loss goal of 6-24 lb at graduation from Pulmonary Rehab     Personal Goal #2 Re-Evaluation   Personal Goal #2 change from whole milk to 2% milk     Personal Goal #3 Re-Evaluation   Personal Goal #3 Increase consumption of whole grains to at least 1-2 servings per day      Psychosocial: Target Goals: Acknowledge presence or absence of significant depression and/or stress, maximize coping skills, provide positive support system. Participant is able to verbalize types and ability to use techniques and skills needed for reducing stress and depression.  Initial Review & Psychosocial Screening:     Initial Psych Review & Screening - 09/07/16 [place]? Yes     Barriers   Psychosocial barriers to participate in program There are no identifiable barriers or psychosocial needs.     Screening Interventions   Interventions Encouraged to exercise      Quality of Life Scores:   PHQ-9: Recent Review Flowsheet Data    Depression screen [facility] 2/9 09/07/2016    Decreased Interest 0   Down, Depressed, Hopeless 0   PHQ - 2 Score 0     Interpretation of Total Score  Total Score Depression Severity:  1-4 = Minimal depression, 5-9 = Mild depression, 10-14 = Moderate depression, 15-19 = Moderately severe depression, 20-27 = Severe depression   Psychosocial Evaluation and Intervention:     Psychosocial Evaluation - 10/13/16 0831      Psychosocial Evaluation & Interventions   Interventions Encouraged to exercise with the program and follow exercise prescription      Psychosocial Re-Evaluation:     Psychosocial Re-Evaluation    [place] Name 10/13/16 0831 11/10/16 0753 11/30/16 1611 12/28/16 1201       Psychosocial Re-Evaluation   Comments no psychosocial barriers identified over the past 30 days no psychosocial barriers identified over the past 30 days no psychosocial barriers identified over the past 30 days no psychosocial barriers identified over the past 30 days    Interventions Encouraged to attend Pulmonary Rehabilitation for the exercise Encouraged to attend Pulmonary Rehabilitation for the exercise Encouraged to attend Pulmonary Rehabilitation for the exercise  Encouraged to attend Pulmonary Rehabilitation for the exercise    Continue Psychosocial Services   -  -  - No Follow up required       Psychosocial Discharge (Final Psychosocial Re-Evaluation):     Psychosocial Re-Evaluation - 12/28/16 1201      Psychosocial Re-Evaluation   Comments no psychosocial barriers identified over the past 30 days   Interventions Encouraged to attend Pulmonary Rehabilitation for the exercise   Continue Psychosocial Services  No Follow up required      Education: Education Goals: Education classes will be provided on a weekly basis, covering required topics. Participant will state understanding/return demonstration of topics presented.  Learning Barriers/Preferences:     Learning Barriers/Preferences - 09/07/16 0941      Learning  Barriers/Preferences   Learning Barriers None   Learning Preferences Written Material;Video;Verbal Instruction;Skilled Demonstration;Pictoral;Individual Instruction;Group Instruction;Audio      Education Topics: Risk Factor Reduction:  -Group instruction that is supported by a PowerPoint presentation. Instructor discusses the definition of a risk factor, different risk factors for pulmonary disease, and how the heart and lungs work together.     Nutrition for Pulmonary Patient:  -Group instruction provided by PowerPoint slides, verbal discussion, and written materials to support subject matter. The instructor gives an explanation and review of healthy diet recommendations, which includes a discussion on weight management, recommendations for fruit and vegetable consumption, as well as protein, fluid, caffeine, fiber, sodium, sugar, and alcohol. Tips for eating when patients are short of breath are discussed.   Pursed Lip Breathing:  -Group instruction that is supported by demonstration and informational handouts. Instructor discusses the benefits of pursed lip and diaphragmatic breathing and detailed demonstration on how to preform both.   Flowsheet Row PULMONARY REHAB OTHER RESPIRATORY from 12/24/2016 in [place]  Date  10/08/16  Educator  ep  Instruction Review Code  2- meets goals/outcomes      Oxygen Safety:  -Group instruction provided by PowerPoint, verbal discussion, and written material to support subject matter. There is an overview of "What is Oxygen" and "Why do we need it".  Instructor also reviews how to create a safe environment for oxygen use, the importance of using oxygen as prescribed, and the risks of noncompliance. There is a brief discussion on traveling with oxygen and resources the patient may utilize.   Oxygen Equipment:  -Group instruction provided by [facility] Staff utilizing handouts, written materials, and equipment  demonstrations.   Signs and Symptoms:  -Group instruction provided by written material and verbal discussion to support subject matter. Warning signs and symptoms of infection, stroke, and heart attack are reviewed and when to call the physician/911 reinforced. Tips for preventing the spread of infection discussed. Flowsheet Row PULMONARY REHAB OTHER RESPIRATORY from 12/24/2016 in [place]  Date  11/19/16  Educator  RN  Instruction Review Code  2- meets goals/outcomes      Advanced Directives:  -Group instruction provided by verbal instruction and written material to support subject matter. Instructor reviews Advanced Directive laws and proper instruction for filling out document.   Pulmonary Video:  -Group video education that reviews the importance of medication and oxygen compliance, exercise, good nutrition, pulmonary hygiene, and pursed lip and diaphragmatic breathing for the pulmonary patient. Flowsheet Row PULMONARY REHAB OTHER RESPIRATORY from 12/24/2016 in [place]  Date  12/10/16  Instruction Review Code  2- meets goals/outcomes      Exercise for the  Pulmonary Patient:  -Group instruction that is supported by a PowerPoint presentation. Instructor discusses benefits of exercise, core components of exercise, frequency, duration, and intensity of an exercise routine, importance of utilizing pulse oximetry during exercise, safety while exercising, and options of places to exercise outside of rehab.   Flowsheet Row PULMONARY REHAB OTHER RESPIRATORY from 12/24/2016 in [place]  Date  12/03/16  Educator  EP  Instruction Review Code  2- meets goals/outcomes      Pulmonary Medications:  -Verbally interactive group education provided by instructor with focus on inhaled medications and proper administration. Flowsheet Row PULMONARY REHAB OTHER RESPIRATORY from 12/24/2016 in [place]  Date  11/12/16  Educator  Pharm D  Instruction Review Code  2- meets goals/outcomes      Anatomy and Physiology of the Respiratory System and Intimacy:  -Group instruction provided by PowerPoint, verbal discussion, and written material to support subject matter. Instructor reviews respiratory cycle and anatomical components of the respiratory system and their functions. Instructor also reviews differences in obstructive and restrictive respiratory diseases with examples of each. Intimacy, Sex, and Sexuality differences are reviewed with a discussion on how relationships can change when diagnosed with pulmonary disease. Common sexual concerns are reviewed. Flowsheet Row PULMONARY REHAB OTHER RESPIRATORY from 12/24/2016 in [place]  Date  12/24/16  Educator  RN  Instruction Review Code  2- meets goals/outcomes      Knowledge Questionnaire Score:   Core Components/Risk Factors/Patient Goals at Admission:     Personal Goals and Risk Factors at Admission - 09/07/16 0945      Core Components/Risk Factors/Patient Goals on Admission   Increase Strength and Stamina Yes   Improve shortness of breath with ADL's Yes      Core Components/Risk Factors/Patient Goals Review:      Goals and Risk Factor Review    Row Name 09/07/16 0946 10/13/16 0830 11/10/16 0753 11/30/16 1611 12/28/16 1201     Core Components/Risk Factors/Patient Goals Review   Personal Goals Review Increase Strength and Stamina;Improve shortness of breath with ADL's Increase Strength and Stamina;Improve shortness of breath with ADL's Increase Strength and Stamina;Improve shortness of breath with ADL's Increase Strength and Stamina;Improve shortness of breath with ADL's Improve shortness of breath with ADL's   Review Has been sedentary following 2 knee replacements and feels that her shortness of breath is from her knees limiting her activity see comments section  on ITP see comments section on ITP see comments section on ITP see comments section on ITP   Expected Outcomes  - see expected outcomes on Admission documentation see expected outcomes on Admission documentation see expected outcomes on Admission documentation see expected outcomes on Admission documentation      Core Components/Risk Factors/Patient Goals at Discharge (Final Review):      Goals and Risk Factor Review - 12/28/16 1201      Core Components/Risk Factors/Patient Goals Review   Personal Goals Review Improve shortness of breath with ADL's   Review see comments section on ITP   Expected Outcomes see expected outcomes on Admission documentation      ITP Comments:   Comments: ITP REVIEW Pt is making expected progress toward pulmonary rehab goals after completing 22 sessions. Recommend continued exercise, life style modification, education, and utilization of breathing techniques to increase stamina and strength and decrease shortness of breath with exertion.

## 2016-12-29 NOTE — Progress Notes (Signed)
Daily Session Note  Patient Details  Name: Brittany Robertson MRN: [id number] Date of Birth: 02-08-1941 Referring Provider:   April [place] Pulmonary Rehab Walk Test from 09/08/2016 in [place]  Referring Provider  dr. Chase Caller      Encounter Date: 12/29/2016  Check In:     Session Check In - 12/29/16 1237      Check-In   Location [facility]   Staff Present Su Hilt, MS, ACSM RCEP, Exercise Physiologist;Olinty [place], MS, ACSM CEP, Exercise Physiologist;Maria Whitaker, RN, Roque Cash, RN   Supervising physician immediately available to respond to emergencies [facility] immediately available   Physician(s) Dr Maylene Roes   Medication changes reported     No   Fall or balance concerns reported    No   Tobacco Cessation No Change   Warm-up and Cool-down Performed as group-led instruction   Resistance Training Performed Yes   VAD Patient? No     Pain Assessment   Currently in Pain? No/denies   Multiple Pain Sites No      Capillary Blood Glucose: No results found for this or any previous visit (from the past 24 hour(s)).      Exercise Prescription Changes - 12/29/16 1200      Response to Exercise   Blood Pressure (Admit) 108/68   Blood Pressure (Exercise) 108/64   Blood Pressure (Exit) 98/62   Heart Rate (Admit) 66 bpm   Heart Rate (Exercise) 116 bpm   Heart Rate (Exit) 87 bpm   Oxygen Saturation (Admit) 99 %   Oxygen Saturation (Exercise) 91 %   Oxygen Saturation (Exit) 97 %   Rating of Perceived Exertion (Exercise) 13   Perceived Dyspnea (Exercise) 3   Duration Continue with 45 min of aerobic exercise without signs/symptoms of physical distress.   Intensity THRR unchanged     Progression   Progression Continue to progress workloads to maintain intensity without signs/symptoms of physical distress.     Resistance Training   Training Prescription No   Weight orange bands   Reps 10-15   Time 10  Minutes     Interval Training   Interval Training No     Recumbant Bike   Level 5   Minutes 17     NuStep   Level 6   Minutes 17   METs 3.2     Track   Laps 13   Minutes 17      History  Smoking Status  . Never Smoker  Smokeless Tobacco  . Never Used    Goals Met:  Exercise tolerated well No report of cardiac concerns or symptoms Strength training completed today  Goals Unmet:  Not Applicable  Comments: Service time is from 10:30a to 12:10p    Dr. Rush Farmer is Medical Director for Pulmonary Rehab at [facility].

## 2016-12-31 ENCOUNTER — Encounter (HOSPITAL_COMMUNITY)
Admission: RE | Admit: 2016-12-31 | Discharge: 2016-12-31 | Disposition: A | Discharge status: Home / Self Care | Payer: Medicare Other | Source: Ambulatory Visit | Attending: Internal Medicine | Admitting: Internal Medicine

## 2016-12-31 VITALS — Wt 218.0 lb

## 2016-12-31 DIAGNOSIS — R0602 Shortness of breath: Secondary | ICD-10-CM

## 2016-12-31 NOTE — Progress Notes (Signed)
Daily Session Note  Patient Details  Name: Brittany Robertson MRN: [id number] Date of Birth: 01-22-1941 Referring Provider:     Pulmonary Rehab Walk Test from 09/08/2016 in [place]  Referring Provider  dr. Chase Caller      Encounter Date: 12/31/2016  Check In:     Session Check In - 12/31/16 1016      Check-In   Location [facility]   Staff Present Rosebud Poles, RN, Luisa Hart, RN, BSN;Lisa Hughes, RN;Lia Vigilante, MS, ACSM RCEP, Exercise Physiologist   Supervising physician immediately available to respond to emergencies [facility] immediately available   Physician(s) Dr. Sloan Leiter   Medication changes reported     No   Fall or balance concerns reported    No   Tobacco Cessation No Change   Warm-up and Cool-down Performed as group-led instruction   Resistance Training Performed Yes   VAD Patient? No     Pain Assessment   Currently in Pain? No/denies   Multiple Pain Sites No      Capillary Blood Glucose: No results found for this or any previous visit (from the past 24 hour(s)).      Exercise Prescription Changes - 12/31/16 1300      Response to Exercise   Blood Pressure (Admit) 110/66   Blood Pressure (Exercise) 104/60   Blood Pressure (Exit) 98/60   Heart Rate (Admit) 69 bpm   Heart Rate (Exercise) 101 bpm   Heart Rate (Exit) 75 bpm   Oxygen Saturation (Admit) 98 %   Oxygen Saturation (Exercise) 99 %   Oxygen Saturation (Exit) 97 %   Rating of Perceived Exertion (Exercise) 12   Perceived Dyspnea (Exercise) 2   Duration Continue with 45 min of aerobic exercise without signs/symptoms of physical distress.   Intensity THRR unchanged     Progression   Progression Continue to progress workloads to maintain intensity without signs/symptoms of physical distress.     Resistance Training   Training Prescription No   Weight orange bands   Reps 10-15   Time 10 Minutes     Interval Training   Interval Training No     Bike   Level 4.5   Minutes 17     NuStep   Level 6   Minutes 17   METs 2.9     Track   Laps 13   Minutes 17      History  Smoking Status  . Never Smoker  Smokeless Tobacco  . Never Used    Goals Met:  Exercise tolerated well No report of cardiac concerns or symptoms Strength training completed today  Goals Unmet:  Not Applicable  Comments: Service time is from 10:30A to 12:45P    Dr. Rush Farmer is Medical Director for Pulmonary Rehab at [facility].

## 2017-01-05 ENCOUNTER — Encounter (HOSPITAL_COMMUNITY)
Admission: RE | Admit: 2017-01-05 | Discharge: 2017-01-05 | Disposition: A | Discharge status: Home / Self Care | Payer: Medicare Other | Source: Ambulatory Visit | Attending: Internal Medicine | Admitting: Internal Medicine

## 2017-01-05 DIAGNOSIS — R0602 Shortness of breath: Secondary | ICD-10-CM

## 2017-01-06 ENCOUNTER — Encounter (HOSPITAL_COMMUNITY): Payer: Self-pay | Admitting: *Deleted

## 2017-01-07 ENCOUNTER — Encounter (HOSPITAL_COMMUNITY): Payer: Medicare Other

## 2017-01-08 ENCOUNTER — Ambulatory Visit
Admission: RE | Admit: 2017-01-08 | Discharge: 2017-01-08 | Disposition: A | Discharge status: Home / Self Care | Payer: Medicare Other | Source: Ambulatory Visit | Attending: Family Medicine | Admitting: Family Medicine

## 2017-01-08 DIAGNOSIS — Z1231 Encounter for screening mammogram for malignant neoplasm of breast: Secondary | ICD-10-CM

## 2017-01-10 ENCOUNTER — Encounter: Payer: Self-pay | Admitting: Cardiology

## 2017-01-10 NOTE — Progress Notes (Signed)
Cardiology Office Note    Date:  01/11/2017   ID:  Brittany Robertson, DOB Jun 23, 1941, MRN [id number]  PCP:  Mayra Neer, MD  Cardiologist:  Fransico Him, MD   Chief Complaint  Patient presents with  . Coronary Artery Disease  . Hypertension  . Hyperlipidemia  . Congestive Heart Failure    History of Present Illness:  Brittany Robertson is a 76 y.o. female with a history of ASCAD with prior LAD PCI, HTN, obesity and dyslipidemia who presents back for followup. She has chronic SOB and has had an extensive cardiac workup in the past with low risk nuclear stress test.and then cath (due to ongoing SOB with some chest pain) showing patent LAD stent and otherwise normal coronary arteries in 2014. Cath did show mild diastolic dysfunction and she was started on Laix and her beta blocker was continued.  Earlier in the year 2017 she complained again of SOB and nuclear stress test, 2D echo and BNP were normal. She was referred to Pulmonary and HR CT scan showed no evidence of ILD.  There was also noted to be mild dilated ascending aortic aneurysm at 4.2cm (increased from 3.8cm 2012).  A CPST with bike was ordered which was normal and her SOB was felt to be due to body habitus.  She did have evidence as well of Left main with 3 vessel CAD noted on chest CT.  She ultimately underwent repeat cath 08/2016 showing essentially normal coronary arteries with normal LVEDP, normal right heart pressures and normal LVF.  It was felt that her SOB was due to obesity.  She was referred to pulmonary rehab and has not graduated.  She is here for followup today.  She denies any anginal chest pain.  Her SOB improved significantly with pulmonary rehab.  She denies any LE edema, dizziness, palpitations, or syncope.    Past Medical History:  Diagnosis Date  . Bruises easily   . Chronic diastolic CHF (congestive heart failure) ([place])   . Complication of anesthesia    "takes a long time towake up"  . Coronary artery  disease    PCI 2006, repeat cath 30% mid LAD distal to stent otherwise normal coronary arteries, cath 08/2013 with patent LAD stent and otherwise normal coronary arteries with diastolic dysfunction. Cath 08/2016 with normal RHC, normal LVEDP, patent LAD stent with otherwise normal coronary arteries  . DDD (degenerative disc disease), cervical   . GERD (gastroesophageal reflux disease)   . Hyperlipidemia   . Hypertension   . OA (osteoarthritis)   . Obesity   . Osteopenia   . PONV (postoperative nausea and vomiting)    Motion sickness when move out of PACU to room  . PVC's (premature ventricular contractions)   . SOB (shortness of breath) 07/13/2016   non cardiac and secondary to deconditioning and obesity    Past Surgical History:  Procedure Laterality Date  . ABDOMINAL HYSTERECTOMY    . APPENDECTOMY  1957  . arthroscopic knee  05/2002,04/07/2005   right and then also torn muscle surgery on right knee (2003(  . arthroscopic knee  01/02/2005,09/21/2006   left  . CARDIAC CATHETERIZATION N/A 08/31/2016   Procedure: Right/Left Heart Cath and Coronary Angiography;  Surgeon: Sherren Mocha, MD;  Location: [facility];  Service: Cardiovascular;  Laterality: N/A;  . CHOLECYSTECTOMY  1988  . COLONOSCOPY  2010   with Endo  . CORONARY ANGIOPLASTY  02/05/2004  . CYST REMOVAL HAND  2000  . DILATION AND CURETTAGE OF  UTERUS  1972  . EYE SURGERY  01/2007   bilateral cataract with lens implant  . FOOT SURGERY  01/2012   left and right bunion and hammer toe  . HAND SURGERY  05/29/2011   joint removed  . [place]  . HERNIA REPAIR  02/2010   incisional hernioa  . KNEE ARTHROSCOPY    . LEFT HEART CATHETERIZATION WITH CORONARY ANGIOGRAM N/A 09/19/2013   Procedure: LEFT HEART CATHETERIZATION WITH CORONARY ANGIOGRAM;  Surgeon: Sueanne Margarita, MD;  Location: [facility];  Service: Cardiovascular;  Laterality: N/A;  . neck injections  03/2014   back and neck injections from  Dr. Nelva Bush  . stents     x1  . tear duct surgery  1990's   bilateral  . TONSILLECTOMY     as child  . TOTAL KNEE ARTHROPLASTY Left 01/04/2015   Procedure: LEFT TOTAL KNEE ARTHROPLASTY;  Surgeon: Sydnee Cabal, MD;  Location: [facility];  Service: Orthopedics;  Laterality: Left;  . TOTAL KNEE ARTHROPLASTY Right 05/10/2015   Procedure: TOTAL RIGHT KNEE ARTHROPLASTY;  Surgeon: Sydnee Cabal, MD;  Location: [facility];  Service: Orthopedics;  Laterality: Right;    Current Medications: No outpatient prescriptions have been marked as taking for the 01/11/17 encounter (Office Visit) with Sueanne Margarita, MD.    Allergies:   Atorvastatin; Codeine; Sulfa antibiotics; Sulfamethoxazole; Sulfasalazine; Adhesive [tape]; Other; and Penicillins   Social History   Social History  . Marital status: Married    Spouse name: N/A  . Number of children: N/A  . Years of education: N/A   Social History Main Topics  . Smoking status: Never Smoker  . Smokeless tobacco: Never Used  . Alcohol use 0.6 oz/week    1 Glasses of wine per week     Comment: occassionally  . Drug use: No  . Sexual activity: Not Asked   Other Topics Concern  . None   Social History Narrative  . None     Family History:  The patient's family history includes Cancer in her mother; Heart Problems in her father; Hypertension in her father and mother.   ROS:   Please see the history of present illness.    ROS All other systems reviewed and are negative.  No flowsheet data found.     PHYSICAL EXAM:   VS:  BP 128/90 (BP Location: Left Arm)   Pulse 64   Ht 5\' 4"  (1.626 m)   Wt 216 lb 12.8 oz (98.3 kg)   BMI 37.21 kg/m    GEN: Well nourished, well developed, in no acute distress  HEENT: normal  Neck: no JVD, carotid bruits, or masses Cardiac: RRR; no murmurs, rubs, or gallops,no edema.  Intact distal pulses bilaterally.  Respiratory:  clear to auscultation bilaterally, normal work of breathing GI: soft, nontender,  nondistended, + BS MS: no deformity or atrophy  Skin: warm and dry, no rash Neuro:  Alert and Oriented x 3, Strength and sensation are intact Psych: euthymic mood, full affect  Wt Readings from Last 3 Encounters:  01/11/17 216 lb 12.8 oz (98.3 kg)  12/31/16 218 lb 0.6 oz (98.9 kg)  12/29/16 217 lb 13 oz (98.8 kg)      Studies/Labs Reviewed:   EKG:  EKG is not ordered today.    Recent Labs: 04/24/2016: ALT 15; Brain Natriuretic Peptide 54.6; TSH 3.96 08/26/2016: BUN 19; Creat 0.90; Hemoglobin 13.4; Platelets 231; Potassium 4.6; Sodium 140   Lipid Panel    Component Value  Date/Time   CHOL 120 (L) 04/24/2016 0850   TRIG 130 04/24/2016 0850   HDL 58 04/24/2016 0850   CHOLHDL 2.1 04/24/2016 0850   VLDL 26 04/24/2016 0850   LDLCALC 36 04/24/2016 0850    Additional studies/ records that were reviewed today include:  Cath report and CPXT    ASSESSMENT:    1. Coronary artery disease involving native coronary artery of native heart without angina pectoris   2. Essential hypertension   3. Chronic diastolic CHF (congestive heart failure) ([place])   4. Dyslipidemia, goal LDL below 70      PLAN:  In order of problems listed above:  1. ASCAD s/p remote PCI of LAD with cath 08/2016 showing essentially normal coronary arteries with patent stent, normal LVEDP, normal RHC and normal LVF.  She has noncardiac SOB.  She will continue on statin, ASA and BB.   2. HTN - BP controlled on current meds.  She will continue on BB/ARB. 3. Chronic diastolic CHF - she appears euvolemic on exam and LVEDP was normal at cath a few months ago.  She will continue on diuretic therapy PRN.  I will check a BMET today. 4. Hyperlipidemia with LDL goal < 70.  She will continue on statin therapy and zetia.  I will repeat an FLP and ALT.      Medication Adjustments/Labs and Tests Ordered: Current medicines are reviewed at length with the patient today.  Concerns regarding medicines are outlined above.   Medication changes, Labs and Tests ordered today are listed in the Patient Instructions below.  There are no Patient Instructions on file for this visit.   Signed, Fransico Him, MD  01/11/2017 8:20 AM    [facility] [address], [place], [place]  [address] Phone: [phone]; Fax: [phone]

## 2017-01-11 ENCOUNTER — Ambulatory Visit (INDEPENDENT_AMBULATORY_CARE_PROVIDER_SITE_OTHER): Payer: Medicare Other | Admitting: Cardiology

## 2017-01-11 VITALS — BP 128/90 | HR 64 | Ht 64.0 in | Wt 216.8 lb

## 2017-01-11 DIAGNOSIS — I10 Essential (primary) hypertension: Secondary | ICD-10-CM

## 2017-01-11 DIAGNOSIS — E785 Hyperlipidemia, unspecified: Secondary | ICD-10-CM

## 2017-01-11 DIAGNOSIS — I2510 Atherosclerotic heart disease of native coronary artery without angina pectoris: Secondary | ICD-10-CM | POA: Diagnosis not present

## 2017-01-11 DIAGNOSIS — I5032 Chronic diastolic (congestive) heart failure: Secondary | ICD-10-CM

## 2017-01-11 MED ORDER — FUROSEMIDE 20 MG PO TABS: 20.0000 mg | ORAL_TABLET | Freq: Every day | ORAL | 11 refills | Status: DC | PRN

## 2017-01-11 MED ORDER — EZETIMIBE 10 MG PO TABS: 10.0000 mg | ORAL_TABLET | Freq: Every evening | ORAL | 3 refills | Status: DC

## 2017-01-11 MED ORDER — OLMESARTAN MEDOXOMIL 40 MG PO TABS: 40.0000 mg | ORAL_TABLET | Freq: Every day | ORAL | 3 refills | Status: DC

## 2017-01-11 NOTE — Addendum Note (Signed)
Addended by: Harland German A on: 01/11/2017 10:11 AM   Modules accepted: Orders

## 2017-01-11 NOTE — Patient Instructions (Signed)

## 2017-01-12 ENCOUNTER — Encounter (HOSPITAL_COMMUNITY): Payer: Medicare Other

## 2017-01-12 LAB — BASIC METABOLIC PANEL
BUN / CREAT RATIO: 34 — AB (ref 12–28)
BUN: 24 mg/dL (ref 8–27)
CO2: 23 mmol/L (ref 18–29)
CREATININE: 0.71 mg/dL (ref 0.57–1.00)
Calcium: 9.3 mg/dL (ref 8.7–10.3)
Chloride: 101 mmol/L (ref 96–106)
GFR calc non Af Amer: 82 mL/min/{1.73_m2} (ref >59)
GFR, EST AFRICAN AMERICAN: 95 mL/min/{1.73_m2} (ref >59)
Glucose: 101 mg/dL — ABNORMAL HIGH (ref 65–99)
Potassium: 4.2 mmol/L (ref 3.5–5.2)
SODIUM: 139 mmol/L (ref 134–144)

## 2017-01-12 LAB — LIPID PANEL
Chol/HDL Ratio: 2.5 ratio units (ref 0.0–4.4)
Cholesterol, Total: 124 mg/dL (ref 100–199)
HDL: 49 mg/dL (ref >39)
LDL Calculated: 50 mg/dL (ref 0–99)
Triglycerides: 127 mg/dL (ref 0–149)
VLDL Cholesterol Cal: 25 mg/dL (ref 5–40)

## 2017-01-12 LAB — HEPATIC FUNCTION PANEL
ALT: 14 IU/L (ref 0–32)
AST: 19 IU/L (ref 0–40)
Albumin: 4 g/dL (ref 3.5–4.8)
Alkaline Phosphatase: 52 IU/L (ref 39–117)
BILIRUBIN, DIRECT: 0.14 mg/dL (ref 0.00–0.40)
Bilirubin Total: 0.6 mg/dL (ref 0.0–1.2)
Total Protein: 6.1 g/dL (ref 6.0–8.5)

## 2017-01-14 ENCOUNTER — Encounter (HOSPITAL_COMMUNITY): Payer: Medicare Other

## 2017-01-19 ENCOUNTER — Encounter (HOSPITAL_COMMUNITY): Payer: Self-pay

## 2017-01-19 NOTE — Progress Notes (Signed)
Discharge Summary  Patient Details  Name: Brittany Robertson MRN: [id number] Date of Birth: 1941-05-02 Referring Provider:     Pulmonary Rehab Walk Test from 09/08/2016 in [place]  Referring Provider  dr. Chase Caller       Number of Visits: 23  Reason for Discharge:  Patient reached a stable level of exercise. Patient independent in their exercise.  Smoking History:  History  Smoking Status  . Never Smoker  Smokeless Tobacco  . Never Used    Diagnosis:  No diagnosis found.  ADL UCSD:     Pulmonary Assessment Scores    Row Name 01/06/17 1449         ADL UCSD   ADL Phase Exit     SOB Score total 8        Initial Exercise Prescription:     Initial Exercise Prescription - 09/08/16 1600      Date of Initial Exercise RX and Referring Provider   Date 09/08/16   Referring Provider dr. Chase Caller     NuStep   Level 2   Minutes 17   METs 1.5     Arm Ergometer   Level 2   Minutes 17     Track   Laps 5   Minutes 17     Prescription Details   Frequency (times per week) 2   Duration Progress to 45 minutes of aerobic exercise without signs/symptoms of physical distress     Intensity   THRR 40-80% of Max Heartrate 57-114   Ratings of Perceived Exertion 11-13   Perceived Dyspnea 0-4     Progression   Progression Continue progressive overload as per policy without signs/symptoms or physical distress.     Resistance Training   Training Prescription Yes   Weight orange bands   Reps 10-12      Discharge Exercise Prescription (Final Exercise Prescription Changes):     Exercise Prescription Changes - 12/31/16 1300      Response to Exercise   Blood Pressure (Admit) 110/66   Blood Pressure (Exercise) 104/60   Blood Pressure (Exit) 98/60   Heart Rate (Admit) 69 bpm   Heart Rate (Exercise) 101 bpm   Heart Rate (Exit) 75 bpm   Oxygen Saturation (Admit) 98 %   Oxygen Saturation (Exercise) 99 %   Oxygen Saturation (Exit)  97 %   Rating of Perceived Exertion (Exercise) 12   Perceived Dyspnea (Exercise) 2   Duration Continue with 45 min of aerobic exercise without signs/symptoms of physical distress.   Intensity THRR unchanged     Progression   Progression Continue to progress workloads to maintain intensity without signs/symptoms of physical distress.     Resistance Training   Training Prescription No   Weight orange bands   Reps 10-15   Time 10 Minutes     Interval Training   Interval Training No     Bike   Level 4.5   Minutes 17     NuStep   Level 6   Minutes 17   METs 2.9     Track   Laps 13   Minutes 17      Functional Capacity:     6 Minute Walk    Row Name 09/08/16 1633 01/05/17 1536       6 Minute Walk   Phase Initial Discharge    Distance 1020 feet 1349 feet    Walk Time 6 minutes 6 minutes    # of Rest Breaks 0  0    MPH 1.93 2.55    METS 2.45 2.99    RPE 14 12    Perceived Dyspnea  1 1    Symptoms No No    Resting HR 66 bpm 78 bpm    Resting BP 119/80 120/70    Max Ex. HR 102 bpm 113 bpm    Max Ex. BP 125/76 178/80    2 Minute Post BP  - 120/70      Interval HR   Baseline HR 66 78    1 Minute HR 81 -  not able to get reading    2 Minute HR 94 109    3 Minute HR 99 -  not able to get reading    4 Minute HR 101 113    5 Minute HR 98 -  not able to get reading    6 Minute HR 102 111    2 Minute Post HR 68 75    Interval Heart Rate? Yes Yes      Interval Oxygen   Interval Oxygen? Yes Yes    Baseline Oxygen Saturation % 97 % 97 %    Baseline Liters of Oxygen 0 L 0 L    1 Minute Oxygen Saturation % 96 % -  not able to get reading    1 Minute Liters of Oxygen 0 L 0 L    2 Minute Oxygen Saturation % 99 % 90 %    2 Minute Liters of Oxygen 0 L 0 L    3 Minute Oxygen Saturation % 96 % -  not able to get reading    3 Minute Liters of Oxygen 0 L 0 L    4 Minute Oxygen Saturation % 91 % 95 %    4 Minute Liters of Oxygen 0 L 0 L    5 Minute Oxygen Saturation  % 90 % -  not able to get reading    5 Minute Liters of Oxygen 0 L 0 L    6 Minute Oxygen Saturation % 90 % 93 %    6 Minute Liters of Oxygen 0 L 0 L    2 Minute Post Oxygen Saturation % 99 % 98 %    2 Minute Post Liters of Oxygen 0 L 0 L       Psychological, QOL, Others - Outcomes: PHQ 2/9: Depression screen [facility] 2/9 01/05/2017 09/07/2016  Decreased Interest 0 0  Down, Depressed, Hopeless 0 0  PHQ - 2 Score 0 0    Quality of Life:     Quality of Life - 01/06/17 1450      Quality of Life Scores   Health/Function Post 30 %   Socioeconomic Post 30 %   Psych/Spiritual Post 30 %   Family Post 30 %   GLOBAL Post 30 %      Personal Goals: Goals established at orientation with interventions provided to work toward goal.     Personal Goals and Risk Factors at Admission - 09/07/16 0945      Core Components/Risk Factors/Patient Goals on Admission   Increase Strength and Stamina Yes   Improve shortness of breath with ADL's Yes       Personal Goals Discharge:     Goals and Risk Factor Review    Row Name 09/07/16 0946 10/13/16 0830 11/10/16 0753 11/30/16 1611 12/28/16 1201     Core Components/Risk Factors/Patient Goals Review   Personal Goals Review Increase Strength and Stamina;Improve shortness of breath with  ADL's Increase Strength and Stamina;Improve shortness of breath with ADL's Increase Strength and Stamina;Improve shortness of breath with ADL's Increase Strength and Stamina;Improve shortness of breath with ADL's Improve shortness of breath with ADL's   Review Has been sedentary following 2 knee replacements and feels that her shortness of breath is from her knees limiting her activity see comments section on ITP see comments section on ITP see comments section on ITP see comments section on ITP   Expected Outcomes  - see expected outcomes on Admission documentation see expected outcomes on Admission documentation see expected outcomes on Admission documentation see  expected outcomes on Admission documentation   Row Name 01/07/17 1023             Core Components/Risk Factors/Patient Goals Review   Personal Goals Review Weight Management/Obesity       Review Pt has lost 8.9 lb since admission. Wt loss goal met.        Expected Outcomes Continue healthy eating and lifestyle changes to promote desired wt loss.           Nutrition & Weight - Outcomes:     Pre Biometrics - 09/07/16 0944      Pre Biometrics   Grip Strength 24 kg       Nutrition:     Nutrition Therapy & Goals - 11/16/16 1345      Nutrition Therapy   Diet Therapeutic Lifestyle Changes     Personal Nutrition Goals   Nutrition Goal Wt loss of 1-2 lb/week with a wt loss goal of 6-24 lb at graduation from [facility], educate and counsel regarding individualized specific dietary modifications aiming towards targeted core components such as weight, hypertension, lipid management, diabetes, heart failure and other comorbidities.   Expected Outcomes Short Term Goal: Understand basic principles of dietary content, such as calories, fat, sodium, cholesterol and nutrients.;Long Term Goal: Adherence to prescribed nutrition plan.      Nutrition Discharge:     Nutrition Assessments - 01/07/17 1020      Rate Your Plate Scores   Pre Score 58   Post Score 63      Education Questionnaire Score:     Knowledge Questionnaire Score - 01/06/17 1449      Knowledge Questionnaire Score   Post Score 11/13     Brittany Robertson has been discharged from pulmonary rehab after successful completion of the program. She met her personal goals and plans to continue to exercise at the [facility].

## 2017-01-28 ENCOUNTER — Other Ambulatory Visit: Payer: Self-pay | Admitting: *Deleted

## 2017-01-28 MED ORDER — FUROSEMIDE 20 MG PO TABS: 20.0000 mg | ORAL_TABLET | Freq: Every day | ORAL | 1 refills | Status: DC | PRN

## 2017-02-15 ENCOUNTER — Ambulatory Visit (INDEPENDENT_AMBULATORY_CARE_PROVIDER_SITE_OTHER): Payer: Medicare Other | Admitting: Internal Medicine

## 2017-02-15 ENCOUNTER — Encounter: Payer: Self-pay | Admitting: Internal Medicine

## 2017-02-15 VITALS — BP 118/88 | HR 59 | Ht 64.0 in | Wt 221.4 lb

## 2017-02-15 DIAGNOSIS — R911 Solitary pulmonary nodule: Secondary | ICD-10-CM

## 2017-02-15 DIAGNOSIS — R0609 Other forms of dyspnea: Secondary | ICD-10-CM | POA: Diagnosis not present

## 2017-02-15 NOTE — Patient Instructions (Addendum)
ICD-9-CM ICD-10-CM   1. Exertional dyspnea 786.09 R06.09   2. Nodule of right lung 793.11 R91.1     Glad you are better after rehab For Right middle lobe 4 mm lung ndule - see [id number]  - do CT chest wo contrast Oct 2018; we will call with results  Followup  - no active followup - we will call with CT results in oct 2018

## 2017-02-15 NOTE — Progress Notes (Signed)
Subjective:     Patient ID: Brittany Robertson, female   DOB: 02/02/1941, 76 y.o.   MRN: [id number]  HPI  PCP Mayra Neer, MD   HPI   IOV 06/29/2016  Chief Complaint  Patient presents with  . Pulmonary Consult    Pt referred by Dr. Fransico Him for abnormal PFT. Pt c/o DOE with little activity, dry cough and left upper chest pain when SOB. Pt saw cardiology in 04/2016 and did stress test and echo.      Brittany Robertson is a 76 y.o. female who is a resident of [place]. She presents with a husband because of insidious onset of shortness of breath for last 9 months. She tells me that in 2005 she had similar shortness of breath and at that time had coronary stent placed and shortness of breath resolved. Then this time dyspnea has recurred some 6-9 months ago. Since then it is been progressive. It is a moderate intensity. It is associated with a mild dry cough. This also progressive. She went to cardiologist Dr. Radford Pax who did cardiac stress test 05/14/2016 and this was low risk. Patient then had pulmonary function test which showed isolated reduction in diffusion capacity of 14.85/61%. Spirometry and total lung capacity normal. I personally visualized the trace. Chest x-ray done 05/25/2016 that I personally visualized shows possibility of interstitial lung disease. She does have a CT chest in 2012 done during the time of trauma that shows some infiltrates. She and her husband now wants this evaluated. Most recent hemoglobin 04/24/2016 is normal at 13.1 g percent. Creatinine is normal at 0.76 mg percent.    OV 08/10/2016  Chief Complaint  Patient presents with  . Follow-up    Pt here after HRCT and CPST. Pt states she is still SOB, no change. Pt c/o occassional dry cough and upper left chest pain. Pt denies f/c/s and swelling.    Follow-up dyspnea  Here with her husband review high-resolution CT chest documented below and pulmonary stress test done 07/29/2016. Pulmonary stress test  results reviewed. She was wheezing at the outset. She had dyspnea then exercise. She given adequate exercise. Etiology of dyspnea is likely obesity and possible diastolic dysfunction but mostly obesity. This is based on Ve. there was no evidence of exercise-induced spasm. Today we did exhaled nitric oxide and this was normal at 10. In discussing the results husband reported that several years ago she had upper endoscopy for a sensation of frog in the throat and was referred to Dr. Janace Hoard and ENT and something is found in the vocal . He also admits to significant snoring of the wife but no nighttime apnea spells. There is a definite fatigue but no excessive daytime somnolence. They're wondering about possible sleep apnea.      IMPRESSION: 1. No evidence of interstitial lung disease. 2. Right middle lobe 4 mm solid pulmonary nodule, new since 09/27/2011. No follow-up needed if patient is low-risk. Non-contrast chest CT can be considered in 12 months if patient is high-risk. This recommendation follows the consensus statement: Guidelines for Management of Incidental Pulmonary Nodules Detected on CT Images: From the Fleischner Society 2017; Radiology 2017; 284:228-243. 3. 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 4. Aortic atherosclerosis. 5. **An incidental finding of potential clinical significance has been found. Ascending thoracic aortic 4.2 cm aneurysm, new since 2012. Recommend annual imaging followup by CTA or MRA. This recommendation follows 2010 ACCF/AHA/AATS/ACR/ASA/SCA/SCAI/SIR/STS/SVM Guidelines for the Diagnosis and Management of Patients with Thoracic Aortic Disease. Circulation.  2010; 121: T017-B939. ** 6. Left main and 3 vessel coronary atherosclerosis.   Electronically Signed By: Ilona Sorrel M.D. On: 07/03/2016 10:38      OV 02/15/17  Chief Complaint  Patient presents with  . Follow-up    Pt states she is doing well since starting pulmonary rehab. Pt  denies cough, CP/tightness, cough.    Follow-up dyspnea due to obesity and diastolic dysfunction: She attended pulmonary habitation and this helped significantly. Now doing yard work. She and her husband are thankful. A few months ago she did have the flu was given Z-Pak. She did have some pleuritic chest pain at that time. It is now resolved. She feels better than ever before.  Follow-up right middle lobe lung nodule.: She had questions about this. She is due for annual CT scan later this year in September 2018. Explained low risk for lung cancer.    has a past medical history of Bruises easily; Chronic diastolic CHF (congestive heart failure) ([place]); Complication of anesthesia; Coronary artery disease; DDD (degenerative disc disease), cervical; GERD (gastroesophageal reflux disease); Hyperlipidemia; Hypertension; OA (osteoarthritis); Obesity; Osteopenia; PONV (postoperative nausea and vomiting); PVC's (premature ventricular contractions); and SOB (shortness of breath) (07/13/2016).   reports that she has never smoked. She has never used smokeless tobacco.  Past Surgical History:  Procedure Laterality Date  . ABDOMINAL HYSTERECTOMY    . APPENDECTOMY  1957  . arthroscopic knee  05/2002,04/07/2005   right and then also torn muscle surgery on right knee (2003(  . arthroscopic knee  01/02/2005,09/21/2006   left  . CARDIAC CATHETERIZATION N/A 08/31/2016   Procedure: Right/Left Heart Cath and Coronary Angiography;  Surgeon: Sherren Mocha, MD;  Location: [facility];  Service: Cardiovascular;  Laterality: N/A;  . CHOLECYSTECTOMY  1988  . COLONOSCOPY  2010   with Endo  . CORONARY ANGIOPLASTY  02/05/2004  . CYST REMOVAL HAND  2000  . [place] OF UTERUS  1972  . EYE SURGERY  01/2007   bilateral cataract with lens implant  . FOOT SURGERY  01/2012   left and right bunion and hammer toe  . HAND SURGERY  05/29/2011   joint removed  . [place]  . HERNIA REPAIR   02/2010   incisional hernioa  . KNEE ARTHROSCOPY    . LEFT HEART CATHETERIZATION WITH CORONARY ANGIOGRAM N/A 09/19/2013   Procedure: LEFT HEART CATHETERIZATION WITH CORONARY ANGIOGRAM;  Surgeon: Sueanne Margarita, MD;  Location: [facility];  Service: Cardiovascular;  Laterality: N/A;  . neck injections  03/2014   back and neck injections from Dr. Nelva Bush  . stents     x1  . tear duct surgery  1990's   bilateral  . TONSILLECTOMY     as child  . TOTAL KNEE ARTHROPLASTY Left 01/04/2015   Procedure: LEFT TOTAL KNEE ARTHROPLASTY;  Surgeon: Sydnee Cabal, MD;  Location: [facility];  Service: Orthopedics;  Laterality: Left;  . TOTAL KNEE ARTHROPLASTY Right 05/10/2015   Procedure: TOTAL RIGHT KNEE ARTHROPLASTY;  Surgeon: Sydnee Cabal, MD;  Location: [facility];  Service: Orthopedics;  Laterality: Right;    Allergies  Allergen Reactions  . Atorvastatin Other (See Comments)    GENERIC causes severe skin rash but can take Brand Name  . Codeine Nausea And Vomiting  . Sulfa Antibiotics Hives and Itching  . Sulfamethoxazole Hives  . Sulfasalazine Itching and Hives  . Adhesive [Tape] Itching, Rash and Other (See Comments)    Skin irritation  . Other Rash  Surgical tape  . Penicillins Hives, Itching and Rash    Has patient had a PCN reaction causing immediate rash, facial/tongue/throat swelling, SOB or lightheadedness with hypotension: Yes Has patient had a PCN reaction causing severe rash involving mucus membranes or skin necrosis: Yes Has patient had a PCN reaction that required hospitalization No Has patient had a PCN reaction occurring within the last 10 years: No If all of the above answers are "NO", then may proceed with Cephalosporin use.     Immunization History  Administered Date(s) Administered  . Influenza, High Dose Seasonal PF 06/29/2016  . Influenza,inj,Quad PF,36+ Mos 05/20/2015  . Pneumococcal Conjugate-13 10/19/2013    Family History  Problem Relation Age of Onset  .  Hypertension Mother   . Cancer Mother   . Hypertension Father   . Heart Problems Father      Current Outpatient Prescriptions:  .  acetaminophen (TYLENOL) 500 MG tablet, Take 500-1,000 mg by mouth daily as needed for headache., Disp: , Rfl:  .  aspirin 81 MG tablet, Take 81 mg by mouth every evening. , Disp: , Rfl:  .  atorvastatin (LIPITOR) 20 MG tablet, Take 20 mg by mouth at bedtime. Brand Name Only, Disp: , Rfl:  .  Calcium Carbonate-Vitamin D (CALCIUM + D PO), Take 1 tablet by mouth every other day. , Disp: , Rfl:  .  cetirizine (ZYRTEC) 10 MG tablet, Take 10 mg by mouth every morning.  , Disp: , Rfl:  .  cholecalciferol (VITAMIN D) 400 UNITS TABS, Take 400 Units by mouth every other day. , Disp: , Rfl:  .  Coenzyme Q10 Liposomal 100 MG/ML LIQD, Take 100 mg by mouth every evening., Disp: , Rfl:  .  denosumab (PROLIA) 60 MG/ML SOLN injection, Inject 60 mg into the skin every 6 (six) months. Administer in upper arm, thigh, or abdomen, Disp: , Rfl:  .  docusate sodium (COLACE) 100 MG capsule, Take 100 mg by mouth 2 (two) times daily. , Disp: , Rfl:  .  ezetimibe (ZETIA) 10 MG tablet, Take 1 tablet (10 mg total) by mouth every evening., Disp: 90 tablet, Rfl: 3 .  fish oil-omega-3 fatty acids 1000 MG capsule, Take 1 g by mouth 2 (two) times daily.  , Disp: , Rfl:  .  furosemide (LASIX) 20 MG tablet, Take 1 tablet (20 mg total) by mouth daily as needed for fluid or edema., Disp: 45 tablet, Rfl: 1 .  meloxicam (MOBIC) 15 MG tablet, Take 15 mg by mouth every evening.  , Disp: , Rfl:  .  methocarbamol (ROBAXIN) 500 MG tablet, Take 500 mg by mouth 4 (four) times daily as needed for muscle spasms., Disp: , Rfl:  .  metoprolol succinate (TOPROL-XL) 25 MG 24 hr tablet, Take 1 tablet by mouth  daily, Disp: 90 tablet, Rfl: 0 .  mirabegron ER (MYRBETRIQ) 25 MG TB24 tablet, Take 25 mg by mouth every evening., Disp: , Rfl:  .  nitroGLYCERIN (NITROSTAT) 0.4 MG SL tablet, Place 1 tablet (0.4 mg total) under  the tongue every 5 (five) minutes as needed. As needed for chest pain x 3 doses, Disp: 25 tablet, Rfl: 1 .  olmesartan (BENICAR) 40 MG tablet, Take 1 tablet (40 mg total) by mouth daily., Disp: 90 tablet, Rfl: 3 .  omeprazole (PRILOSEC OTC) 20 MG tablet, Take 20 mg by mouth daily., Disp: , Rfl:  .  Polyethyl Glycol-Propyl Glycol (SYSTANE ULTRA OP), Apply 1 drop to eye 2 (two) times daily as needed (  dry eyes)., Disp: , Rfl:  .  potassium chloride SA (K-DUR,KLOR-CON) 20 MEQ tablet, Take 1 tablet by mouth  every morning, Disp: 90 tablet, Rfl: 0 .  vitamin C (ASCORBIC ACID) 500 MG tablet, Take 500 mg by mouth 2 (two) times daily. , Disp: , Rfl:  .  vitamin E 400 UNIT capsule, Take 400 Units by mouth at bedtime. , Disp: , Rfl:    Review of Systems     Objective:   Physical Exam Vitals:   02/15/17 0902  BP: 118/88  Pulse: (!) 59  ;  Vitals:   02/15/17 0902  BP: 118/88  Pulse: (!) 59  SpO2: 98%  Weight: 221 lb 6.4 oz (100.4 kg)  Height: 5\' 4"  (1.626 m)   Brief exam includes alert and oriented 3. Obese. Mild postnasal drip present. Normal gait. Normal heart sounds. Clear to auscultation bilaterally. No edema.     Assessment:       ICD-9-CM ICD-10-CM   1. Exertional dyspnea 786.09 R06.09   2. Nodule of right lung 793.11 R91.1        Plan:      Glad you are better after rehab For Right middle lobe 4 mm lung ndule - see [id number]  - do CT chest wo contrast Oct 2018; we will call with results  Followup  - no active followup - we will call with CT results in oct 2018    Dr. Brand Males, M.D., [facility]C.P Pulmonary and Critical Care Medicine Staff Physician [place] [facility] Pager: [phone], If no answer or between  15:00h - 7:00h: call [phone]  02/15/2017 9:19 AM

## 2017-03-17 ENCOUNTER — Other Ambulatory Visit: Payer: Self-pay | Admitting: Family Medicine

## 2017-03-17 DIAGNOSIS — I7121 Aneurysm of the ascending aorta, without rupture: Secondary | ICD-10-CM

## 2017-03-17 DIAGNOSIS — I712 Thoracic aortic aneurysm, without rupture: Secondary | ICD-10-CM

## 2017-07-26 ENCOUNTER — Other Ambulatory Visit: Payer: Medicare Other

## 2017-07-26 ENCOUNTER — Ambulatory Visit (INDEPENDENT_AMBULATORY_CARE_PROVIDER_SITE_OTHER)
Admission: RE | Admit: 2017-07-26 | Discharge: 2017-07-26 | Disposition: A | Discharge status: Home / Self Care | Payer: Medicare Other | Source: Ambulatory Visit | Attending: Internal Medicine | Admitting: Internal Medicine

## 2017-07-26 DIAGNOSIS — R911 Solitary pulmonary nodule: Secondary | ICD-10-CM | POA: Diagnosis not present

## 2017-07-28 ENCOUNTER — Other Ambulatory Visit: Payer: Self-pay

## 2017-07-28 ENCOUNTER — Ambulatory Visit (HOSPITAL_COMMUNITY): Payer: Medicare Other | Attending: Cardiology

## 2017-07-28 DIAGNOSIS — E785 Hyperlipidemia, unspecified: Secondary | ICD-10-CM | POA: Diagnosis not present

## 2017-07-28 DIAGNOSIS — I5032 Chronic diastolic (congestive) heart failure: Secondary | ICD-10-CM | POA: Diagnosis not present

## 2017-07-28 DIAGNOSIS — I77810 Thoracic aortic ectasia: Secondary | ICD-10-CM | POA: Diagnosis not present

## 2017-07-28 DIAGNOSIS — I340 Nonrheumatic mitral (valve) insufficiency: Secondary | ICD-10-CM | POA: Insufficient documentation

## 2017-07-28 DIAGNOSIS — I110 Hypertensive heart disease with heart failure: Secondary | ICD-10-CM | POA: Diagnosis not present

## 2017-07-28 DIAGNOSIS — I2510 Atherosclerotic heart disease of native coronary artery without angina pectoris: Secondary | ICD-10-CM | POA: Insufficient documentation

## 2017-07-28 DIAGNOSIS — I493 Ventricular premature depolarization: Secondary | ICD-10-CM | POA: Insufficient documentation

## 2017-07-31 ENCOUNTER — Encounter: Payer: Self-pay | Admitting: Cardiology

## 2017-08-02 ENCOUNTER — Telehealth: Payer: Self-pay

## 2017-08-02 DIAGNOSIS — I77810 Thoracic aortic ectasia: Secondary | ICD-10-CM

## 2017-08-02 NOTE — Telephone Encounter (Signed)
-----   Message from Sueanne Margarita, MD sent at 07/31/2017  2:27 PM EDT ----- Echo showed normal LVF with EF 55-60%, increased stiffness of heart muscle, mildly thickened aortic valve with mildly dilated aortic root at 4mm and mild MR - repeat limited echo in 1 year for dilated aorta

## 2017-08-02 NOTE — Telephone Encounter (Signed)
Informed patient of results and verbal understanding expressed.  Limited echo ordered to be scheduled in 1 year. Patient agrees with treatment plan.

## 2017-08-04 ENCOUNTER — Telehealth: Payer: Self-pay | Admitting: Internal Medicine

## 2017-08-04 NOTE — Telephone Encounter (Signed)
MR, patient had a CT without contrast on 07/26/17. She is requesting the results. Please advise, thanks!

## 2017-08-06 NOTE — Telephone Encounter (Signed)
Nodule stable in 1 year  Plan  she can do another CT chest wo contrast in 1 year and return for followup  Dr. Brand Males, M.D., [facility].C.P Pulmonary and Critical Care Medicine Staff Physician [place] [facility] Pager: [phone], If no answer or between  15:00h - 7:00h: call [phone]  08/06/2017 8:59 AM     IMPRESSION: 1. Stable small RIGHT middle lobe nodule over 1 year interval consistent benign etiology per Fleischner criteria. 2.  Coronary artery and Aortic Atherosclerosis (ICD10-I70.0).    reports that she has never smoked. She has never used smokeless tobacco.   Electronically Signed   By: Suzy Bouchard M.D.   On: 07/26/2017 08:34

## 2017-08-06 NOTE — Telephone Encounter (Signed)
Called and spoke with pt and she is aware of results per MR.  Nothing further is needed.

## 2017-11-19 ENCOUNTER — Other Ambulatory Visit: Payer: Self-pay | Admitting: Cardiology

## 2017-12-02 ENCOUNTER — Other Ambulatory Visit: Payer: Self-pay | Admitting: Family Medicine

## 2017-12-02 DIAGNOSIS — Z1231 Encounter for screening mammogram for malignant neoplasm of breast: Secondary | ICD-10-CM

## 2017-12-03 ENCOUNTER — Other Ambulatory Visit: Payer: Self-pay | Admitting: Family Medicine

## 2017-12-03 DIAGNOSIS — N6459 Other signs and symptoms in breast: Secondary | ICD-10-CM

## 2018-01-10 ENCOUNTER — Ambulatory Visit
Admission: RE | Admit: 2018-01-10 | Discharge: 2018-01-10 | Disposition: A | Discharge status: Home / Self Care | Payer: Medicare Other | Source: Ambulatory Visit | Attending: Family Medicine | Admitting: Family Medicine

## 2018-01-10 ENCOUNTER — Ambulatory Visit: Payer: Medicare Other

## 2018-01-10 DIAGNOSIS — N6459 Other signs and symptoms in breast: Secondary | ICD-10-CM

## 2018-01-21 ENCOUNTER — Ambulatory Visit: Payer: Medicare Other | Admitting: Cardiology

## 2018-01-27 ENCOUNTER — Other Ambulatory Visit: Payer: Self-pay | Admitting: Cardiology

## 2018-02-10 ENCOUNTER — Other Ambulatory Visit: Payer: Self-pay

## 2018-02-10 MED ORDER — FUROSEMIDE 20 MG PO TABS: 20.0000 mg | ORAL_TABLET | Freq: Every day | ORAL | 1 refills | Status: DC | PRN

## 2018-02-11 ENCOUNTER — Other Ambulatory Visit: Payer: Self-pay

## 2018-02-18 MED ORDER — FUROSEMIDE 20 MG PO TABS: 20.0000 mg | ORAL_TABLET | Freq: Every day | ORAL | 1 refills | Status: DC | PRN

## 2018-02-18 NOTE — Addendum Note (Signed)
Addended by: Derl Barrow on: 02/18/2018 02:33 PM   Modules accepted: Orders

## 2018-03-08 ENCOUNTER — Encounter (INDEPENDENT_AMBULATORY_CARE_PROVIDER_SITE_OTHER): Payer: Self-pay

## 2018-03-08 ENCOUNTER — Ambulatory Visit: Payer: Medicare Other | Admitting: Cardiology

## 2018-03-08 ENCOUNTER — Encounter: Payer: Self-pay | Admitting: Cardiology

## 2018-03-08 VITALS — BP 146/82 | HR 50 | Ht 64.0 in | Wt 221.6 lb

## 2018-03-08 DIAGNOSIS — I5032 Chronic diastolic (congestive) heart failure: Secondary | ICD-10-CM | POA: Diagnosis not present

## 2018-03-08 DIAGNOSIS — I77819 Aortic ectasia, unspecified site: Secondary | ICD-10-CM | POA: Diagnosis not present

## 2018-03-08 DIAGNOSIS — I10 Essential (primary) hypertension: Secondary | ICD-10-CM

## 2018-03-08 DIAGNOSIS — I2510 Atherosclerotic heart disease of native coronary artery without angina pectoris: Secondary | ICD-10-CM | POA: Diagnosis not present

## 2018-03-08 DIAGNOSIS — E785 Hyperlipidemia, unspecified: Secondary | ICD-10-CM | POA: Diagnosis not present

## 2018-03-08 MED ORDER — ATORVASTATIN CALCIUM 20 MG PO TABS: 20.0000 mg | ORAL_TABLET | Freq: Every day | ORAL | 3 refills | Status: DC

## 2018-03-08 MED ORDER — METOPROLOL SUCCINATE ER 25 MG PO TB24: 12.5000 mg | ORAL_TABLET | Freq: Every day | ORAL | 3 refills | Status: DC

## 2018-03-08 MED ORDER — OLMESARTAN MEDOXOMIL 40 MG PO TABS: 40.0000 mg | ORAL_TABLET | Freq: Every day | ORAL | 3 refills | Status: DC

## 2018-03-08 NOTE — Progress Notes (Signed)
Cardiology Office Note:    Date:  03/08/2018   ID:  CARMELINE KOWAL, DOB [birth year], MRN [id number]  PCP:  Mayra Neer, MD  Cardiologist:  No primary care provider on file.    Referring MD: Mayra Neer, MD   Chief Complaint  Patient presents with  . Coronary Artery Disease  . Hypertension  . Hyperlipidemia    History of Present Illness:    ONEAL BIGLOW is a 77 y.o. female with a hx of a SCID status post PCI the LAD in 2006 with repeat cardiac catheterization in 2014 showing 30% mid LAD distal to the stent and otherwise normal coronary arteries and repeat heart cath 08/2016 showed normal coronary arteries with patent LAD stent and normal LVEDP.  Her shortness of breath is felt to be due to obesity and deconditioning..  She has a history of hypertension, obesity, chronic diastolic CHF and hyperlipidemia.  He also has a history of mild aortic aneurysm with the ascending aorta measuring 4.2 cm.  She is here today for followup and is doing well.  She denies any chest pain or pressure, SOB, DOE, PND, orthopnea, LE edema, dizziness, palpitations or syncope. She is compliant with her meds and is tolerating meds with no SE.    Past Medical History:  Diagnosis Date  . Bruises easily   . Chronic diastolic CHF (congestive heart failure) ([place])   . Complication of anesthesia    "takes a long time towake up"  . Coronary artery disease    PCI 2006, repeat cath 30% mid LAD distal to stent otherwise normal coronary arteries, cath 08/2013 with patent LAD stent and otherwise normal coronary arteries with diastolic dysfunction. Cath 08/2016 with normal RHC, normal LVEDP, patent LAD stent with otherwise normal coronary arteries  . DDD (degenerative disc disease), cervical   . Dilated aortic root ([place])    2mm by echo 07/2017  . GERD (gastroesophageal reflux disease)   . Hyperlipidemia   . Hypertension   . OA (osteoarthritis)   . Obesity   . Osteopenia   . PONV (postoperative nausea and  vomiting)    Motion sickness when move out of PACU to room  . PVC's (premature ventricular contractions)   . SOB (shortness of breath) 07/13/2016   non cardiac and secondary to deconditioning and obesity    Past Surgical History:  Procedure Laterality Date  . ABDOMINAL HYSTERECTOMY    . APPENDECTOMY  1957  . arthroscopic knee  05/2002,04/07/2005   right and then also torn muscle surgery on right knee (2003(  . arthroscopic knee  01/02/2005,09/21/2006   left  . CARDIAC CATHETERIZATION N/A 08/31/2016   Procedure: Right/Left Heart Cath and Coronary Angiography;  Surgeon: Sherren Mocha, MD;  Location: [facility];  Service: Cardiovascular;  Laterality: N/A;  . CHOLECYSTECTOMY  1988  . COLONOSCOPY  2010   with Endo  . CORONARY ANGIOPLASTY  02/05/2004  . CYST REMOVAL HAND  2000  . [place] OF UTERUS  1972  . EYE SURGERY  01/2007   bilateral cataract with lens implant  . FOOT SURGERY  01/2012   left and right bunion and hammer toe  . HAND SURGERY  05/29/2011   joint removed  . [place]  . HERNIA REPAIR  02/2010   incisional hernioa  . KNEE ARTHROSCOPY    . LEFT HEART CATHETERIZATION WITH CORONARY ANGIOGRAM N/A 09/19/2013   Procedure: LEFT HEART CATHETERIZATION WITH CORONARY ANGIOGRAM;  Surgeon: Sueanne Margarita, MD;  Location:  [facility];  Service: Cardiovascular;  Laterality: N/A;  . neck injections  03/2014   back and neck injections from Dr. Nelva Bush  . stents     x1  . tear duct surgery  1990's   bilateral  . TONSILLECTOMY     as child  . TOTAL KNEE ARTHROPLASTY Left 01/04/2015   Procedure: LEFT TOTAL KNEE ARTHROPLASTY;  Surgeon: Sydnee Cabal, MD;  Location: [facility];  Service: Orthopedics;  Laterality: Left;  . TOTAL KNEE ARTHROPLASTY Right 05/10/2015   Procedure: TOTAL RIGHT KNEE ARTHROPLASTY;  Surgeon: Sydnee Cabal, MD;  Location: [facility];  Service: Orthopedics;  Laterality: Right;    Current Medications: Current Meds  Medication Sig    . acetaminophen (TYLENOL) 500 MG tablet Take 500-1,000 mg by mouth daily as needed for headache.  Marland Kitchen aspirin 81 MG tablet Take 81 mg by mouth every evening.   Marland Kitchen atorvastatin (LIPITOR) 20 MG tablet Take 20 mg by mouth at bedtime. Brand Name Only  . Calcium Carbonate-Vitamin D (CALCIUM + D PO) Take 1 tablet by mouth every other day.   . cetirizine (ZYRTEC) 10 MG tablet Take 10 mg by mouth every morning.    . cholecalciferol (VITAMIN D) 400 UNITS TABS Take 400 Units by mouth every other day.   . Coenzyme Q10 Liposomal 100 MG/ML LIQD Take 100 mg by mouth every evening.  . denosumab (PROLIA) 60 MG/ML SOLN injection Inject 60 mg into the skin every 6 (six) months. Administer in upper arm, thigh, or abdomen  . docusate sodium (COLACE) 100 MG capsule Take 100 mg by mouth 2 (two) times daily.   Marland Kitchen ezetimibe (ZETIA) 10 MG tablet Take 1 tablet (10 mg total) by mouth every evening. Please keep upcoming appt for future refills. Thank you  . fish oil-omega-3 fatty acids 1000 MG capsule Take 1 g by mouth 2 (two) times daily.    . furosemide (LASIX) 20 MG tablet Take 1 tablet (20 mg total) by mouth daily as needed for fluid or edema.  . meloxicam (MOBIC) 15 MG tablet Take 15 mg by mouth every evening.    . methocarbamol (ROBAXIN) 500 MG tablet Take 500 mg by mouth 4 (four) times daily as needed for muscle spasms.  . metoprolol succinate (TOPROL-XL) 25 MG 24 hr tablet Take 1 tablet by mouth  daily  . mirabegron ER (MYRBETRIQ) 25 MG TB24 tablet Take 25 mg by mouth every evening.  . nitroGLYCERIN (NITROSTAT) 0.4 MG SL tablet Place 1 tablet (0.4 mg total) under the tongue every 5 (five) minutes as needed. As needed for chest pain x 3 doses  . olmesartan (BENICAR) 40 MG tablet TAKE 1 TABLET BY MOUTH  DAILY  . omeprazole (PRILOSEC OTC) 20 MG tablet Take 20 mg by mouth daily.  Vladimir Faster Glycol-Propyl Glycol (SYSTANE ULTRA OP) Apply 1 drop to eye 2 (two) times daily as needed (dry eyes).  . potassium chloride SA  (K-DUR,KLOR-CON) 20 MEQ tablet Take 1 tablet by mouth  every morning  . vitamin C (ASCORBIC ACID) 500 MG tablet Take 500 mg by mouth 2 (two) times daily.   . vitamin E 400 UNIT capsule Take 400 Units by mouth at bedtime.      Allergies:   Atorvastatin; Codeine; Sulfa antibiotics; Sulfamethoxazole; Sulfasalazine; Adhesive [tape]; Other; and Penicillins   Social History   Socioeconomic History  . Marital status: Married    Spouse name: Not on file  . Number of children: Not on file  . Years of education:  Not on file  . Highest education level: Not on file  Occupational History  . Not on file  Social Needs  . Financial resource strain: Not on file  . Food insecurity:    Worry: Not on file    Inability: Not on file  . Transportation needs:    Medical: Not on file    Non-medical: Not on file  Tobacco Use  . Smoking status: Never Smoker  . Smokeless tobacco: Never Used  Substance and Sexual Activity  . Alcohol use: Yes    Alcohol/week: 0.6 oz    Types: 1 Glasses of wine per week    Comment: occassionally  . Drug use: No  . Sexual activity: Not on file  Lifestyle  . Physical activity:    Days per week: Not on file    Minutes per session: Not on file  . Stress: Not on file  Relationships  . Social connections:    Talks on phone: Not on file    Gets together: Not on file    Attends religious service: Not on file    Active member of club or organization: Not on file    Attends meetings of clubs or organizations: Not on file    Relationship status: Not on file  Other Topics Concern  . Not on file  Social History Narrative  . Not on file     Family History: The patient's family history includes Cancer in her mother; Heart Problems in her father; Hypertension in her father and mother.  ROS:   Please see the history of present illness.    ROS  All other systems reviewed and negative.   EKGs/Labs/Other Studies Reviewed:    The following studies were reviewed  today: none  EKG:  EKG is  ordered today.  The ekg ordered today demonstrates sinus bradycardia at 50 bpm with no ST changes and PAC  Recent Labs: No results found for requested labs within last 8760 hours.   Recent Lipid Panel    Component Value Date/Time   CHOL 124 01/11/2017 0841   TRIG 127 01/11/2017 0841   HDL 49 01/11/2017 0841   CHOLHDL 2.5 01/11/2017 0841   CHOLHDL 2.1 04/24/2016 0850   VLDL 26 04/24/2016 0850   LDLCALC 50 01/11/2017 0841    Physical Exam:    VS:  BP (!) 146/82   Pulse (!) 50   Ht 5\' 4"  (1.626 m)   Wt 221 lb 9.6 oz (100.5 kg)   BMI 38.04 kg/m     Wt Readings from Last 3 Encounters:  03/08/18 221 lb 9.6 oz (100.5 kg)  02/15/17 221 lb 6.4 oz (100.4 kg)  01/11/17 216 lb 12.8 oz (98.3 kg)     GEN:  Well nourished, well developed in no acute distress HEENT: Normal NECK: No JVD; No carotid bruits LYMPHATICS: No lymphadenopathy CARDIAC: RRR, no murmurs, rubs, gallops RESPIRATORY:  Clear to auscultation without rales, wheezing or rhonchi  ABDOMEN: Soft, non-tender, non-distended MUSCULOSKELETAL:  No edema; No deformity  SKIN: Warm and dry NEUROLOGIC:  Alert and oriented x 3 PSYCHIATRIC:  Normal affect   ASSESSMENT:    1. Coronary artery disease involving native coronary artery of native heart without angina pectoris   2. Chronic diastolic CHF (congestive heart failure) ([place])   3. Essential hypertension   4. Dyslipidemia, goal LDL below 70    PLAN:    In order of problems listed above:  1.  ASCAD - status post PCI of LAD in 2006  with last cath 08/2016 showing normal coronary arteries and normal LVEDP.  She will continue on aspirin 81 mg daily, beta-blocker and statin.  2.  Chronic diastolic heart failure - appears euvolemic on exam today.  She will continue on Lasix 20 mg as needed for edema.  3.  Hypertension - pressures well controlled on exam today.  She is bradycardic on exam today so I will decrease her Toprol to 25 mg 1/2 tablet  daily.  She will continue on Benicar 40 mg daily for now.  She says she is having a problem getting that the Benicar as is and short supply.  She has enough for another month.  I told her if she gets close to have a less than 1 week supply and she cannot get the Benicar to let me know we may have to switch her to an ACE inhibitor.    4.  Hyperlipidemia - LDL goal less than 70.  Continue on Lipitor 20 mg daily.  LDL was at goal at 46 on 09/20/2017.   LDL was at goal at 46 on 09/20/2017.  5.  Dilated ascending aorta - 40 mm by 2D echocardiogram 07/2017.  I will repeat this 07/2018.  Continue statin.  Medication Adjustments/Labs and Tests Ordered: Current medicines are reviewed at length with the patient today.  Concerns regarding medicines are outlined above.  Orders Placed This Encounter  Procedures  . EKG 12-Lead   No orders of the defined types were placed in this encounter.   Signed, Fransico Him, MD  03/08/2018 8:04 AM    [place]

## 2018-03-08 NOTE — Addendum Note (Signed)
Addended by: Teressa Senter on: 03/08/2018 08:17 AM   Modules accepted: Orders

## 2018-03-08 NOTE — Patient Instructions (Signed)
Medication Instructions:  Your physician has recommended you make the following change in your medication:  DECREASE: Toprol XL to 12.5 mg (1/2 tablet) once a day   If you need a refill on your cardiac medications, please contact your pharmacy first.  Labwork: None ordered   Testing/Procedures: Your physician has requested that you have an echocardiogram in October 2019. Echocardiography is a painless test that uses sound waves to create images of your heart. It provides your doctor with information about the size and shape of your heart and how well your heart's chambers and valves are working. This procedure takes approximately one hour. There are no restrictions for this procedure.   Follow-Up: Your physician wants you to follow-up in: 1 year with Dr. Radford Pax. You will receive a reminder letter in the mail two months in advance. If you don't receive a letter, please call our office to schedule the follow-up appointment.  Any Other Special Instructions Will Be Listed Below (If Applicable).   Thank you for choosing [facility], RN  [phone]  If you need a refill on your cardiac medications before your next appointment, please call your pharmacy.

## 2018-04-24 ENCOUNTER — Other Ambulatory Visit: Payer: Self-pay | Admitting: Cardiology

## 2018-05-05 ENCOUNTER — Telehealth: Payer: Self-pay | Admitting: Internal Medicine

## 2018-05-05 ENCOUNTER — Other Ambulatory Visit: Payer: Self-pay | Admitting: Internal Medicine

## 2018-05-05 DIAGNOSIS — R911 Solitary pulmonary nodule: Secondary | ICD-10-CM

## 2018-05-05 NOTE — Telephone Encounter (Signed)
Pt in office with her spouse to see MR today.  Pt is requesting update on 1y CT.  Per 08/04/17 phone note, pt is to have repeat CT in 1y.  Per our records- CT on file has expired.  CT without has been ordered for 07/2018. Nothing further is needed.   Brand Males, MD routed this conversation to [facility] Triage Luciana Axe, Belva Crome, MD     8:59 AM  Note    Nodule stable in 1 year  Plan  she can do another CT chest wo contrast in 1 year and return for followup  Dr. Brand Males, M.D., [facility].C.P Pulmonary and Critical Care Medicine Staff Physician [facility] Pager: [phone], If no answer or between  15:00h - 7:00h: call [phone]  08/06/2017 8:59 AM

## 2018-05-13 DIAGNOSIS — M25551 Pain in right hip: Secondary | ICD-10-CM | POA: Insufficient documentation

## 2018-06-28 ENCOUNTER — Other Ambulatory Visit: Payer: Self-pay | Admitting: Cardiology

## 2018-08-09 ENCOUNTER — Ambulatory Visit (HOSPITAL_COMMUNITY): Payer: Medicare Other | Attending: Cardiology

## 2018-08-09 ENCOUNTER — Other Ambulatory Visit: Payer: Self-pay

## 2018-08-09 ENCOUNTER — Ambulatory Visit (INDEPENDENT_AMBULATORY_CARE_PROVIDER_SITE_OTHER)
Admission: RE | Admit: 2018-08-09 | Discharge: 2018-08-09 | Disposition: A | Discharge status: Home / Self Care | Payer: Medicare Other | Source: Ambulatory Visit | Attending: Internal Medicine | Admitting: Internal Medicine

## 2018-08-09 DIAGNOSIS — I712 Thoracic aortic aneurysm, without rupture: Secondary | ICD-10-CM | POA: Diagnosis not present

## 2018-08-09 DIAGNOSIS — R911 Solitary pulmonary nodule: Secondary | ICD-10-CM

## 2018-08-09 DIAGNOSIS — I77819 Aortic ectasia, unspecified site: Secondary | ICD-10-CM

## 2018-08-12 ENCOUNTER — Telehealth: Payer: Self-pay

## 2018-08-12 DIAGNOSIS — I7121 Aneurysm of the ascending aorta, without rupture: Secondary | ICD-10-CM

## 2018-08-12 DIAGNOSIS — I712 Thoracic aortic aneurysm, without rupture: Secondary | ICD-10-CM

## 2018-08-12 NOTE — Telephone Encounter (Signed)
The patient has been notified of the result and verbalized understanding.  All questions (if any) were answered. Placed a repeat chest CTA for 1 year.  Sarina Ill, RN 08/12/2018 2:20 PM     Notes recorded by Sueanne Margarita, MD on 08/09/2018 at 1:51 PM EDT Aortic root was not dilated by echo but ascending aorta enlarged by chest CTA - repeat Chest CTA in 1 year for followup of ascending aortic aneurysm ------  Notes recorded by Sueanne Margarita, MD on 08/09/2018 at 1:50 PM EDT Echo showed normal LVF with mildly thickened heart muscle and increased stiffness of heart normal for age

## 2018-08-22 ENCOUNTER — Encounter (HOSPITAL_COMMUNITY): Payer: Self-pay

## 2018-08-22 ENCOUNTER — Emergency Department (HOSPITAL_COMMUNITY)
Admission: EM | Admit: 2018-08-22 | Discharge: 2018-08-22 | Disposition: A | Discharge status: Home / Self Care | Payer: Medicare Other | Attending: Emergency Medicine | Admitting: Emergency Medicine

## 2018-08-22 ENCOUNTER — Emergency Department (HOSPITAL_COMMUNITY): Payer: Medicare Other

## 2018-08-22 ENCOUNTER — Other Ambulatory Visit: Payer: Self-pay

## 2018-08-22 DIAGNOSIS — Z79899 Other long term (current) drug therapy: Secondary | ICD-10-CM | POA: Insufficient documentation

## 2018-08-22 DIAGNOSIS — I110 Hypertensive heart disease with heart failure: Secondary | ICD-10-CM | POA: Insufficient documentation

## 2018-08-22 DIAGNOSIS — N201 Calculus of ureter: Secondary | ICD-10-CM | POA: Diagnosis not present

## 2018-08-22 DIAGNOSIS — E785 Hyperlipidemia, unspecified: Secondary | ICD-10-CM | POA: Diagnosis not present

## 2018-08-22 DIAGNOSIS — R109 Unspecified abdominal pain: Secondary | ICD-10-CM | POA: Diagnosis present

## 2018-08-22 DIAGNOSIS — I2510 Atherosclerotic heart disease of native coronary artery without angina pectoris: Secondary | ICD-10-CM | POA: Diagnosis not present

## 2018-08-22 DIAGNOSIS — I509 Heart failure, unspecified: Secondary | ICD-10-CM | POA: Diagnosis not present

## 2018-08-22 LAB — CBC WITH DIFFERENTIAL/PLATELET
ABS IMMATURE GRANULOCYTES: 0.03 10*3/uL (ref 0.00–0.07)
BASOS ABS: 0.1 10*3/uL (ref 0.0–0.1)
Basophils Relative: 1 %
EOS PCT: 1 %
Eosinophils Absolute: 0.1 10*3/uL (ref 0.0–0.5)
HEMATOCRIT: 41.7 % (ref 36.0–46.0)
Hemoglobin: 13.6 g/dL (ref 12.0–15.0)
Immature Granulocytes: 0 %
LYMPHS ABS: 1.4 10*3/uL (ref 0.7–4.0)
LYMPHS PCT: 17 %
MCH: 31.9 pg (ref 26.0–34.0)
MCHC: 32.6 g/dL (ref 30.0–36.0)
MCV: 97.7 fL (ref 80.0–100.0)
Monocytes Absolute: 0.8 10*3/uL (ref 0.1–1.0)
Monocytes Relative: 9 %
NEUTROS ABS: 6 10*3/uL (ref 1.7–7.7)
NRBC: 0 % (ref 0.0–0.2)
Neutrophils Relative %: 72 %
Platelets: 191 10*3/uL (ref 150–400)
RBC: 4.27 MIL/uL (ref 3.87–5.11)
RDW: 13.7 % (ref 11.5–15.5)
WBC: 8.4 10*3/uL (ref 4.0–10.5)

## 2018-08-22 LAB — URINALYSIS, ROUTINE W REFLEX MICROSCOPIC
BACTERIA UA: NONE SEEN
Bilirubin Urine: NEGATIVE
Glucose, UA: NEGATIVE mg/dL
Ketones, ur: 5 mg/dL — AB
Nitrite: NEGATIVE
PH: 5 (ref 5.0–8.0)
Protein, ur: NEGATIVE mg/dL
RBC / HPF: >50 RBC/hpf — ABNORMAL HIGH (ref 0–5)
SPECIFIC GRAVITY, URINE: 1.036 — AB (ref 1.005–1.030)

## 2018-08-22 LAB — COMPREHENSIVE METABOLIC PANEL
ALBUMIN: 4 g/dL (ref 3.5–5.0)
ALK PHOS: 43 U/L (ref 38–126)
ALT: 17 U/L (ref 0–44)
ANION GAP: 9 (ref 5–15)
AST: 27 U/L (ref 15–41)
BILIRUBIN TOTAL: 1.1 mg/dL (ref 0.3–1.2)
BUN: 21 mg/dL (ref 8–23)
CALCIUM: 8.9 mg/dL (ref 8.9–10.3)
CO2: 22 mmol/L (ref 22–32)
Chloride: 110 mmol/L (ref 98–111)
Creatinine, Ser: 0.91 mg/dL (ref 0.44–1.00)
GFR calc Af Amer: >60 mL/min (ref >60)
GFR calc non Af Amer: 58 mL/min — ABNORMAL LOW (ref >60)
GLUCOSE: 136 mg/dL — AB (ref 70–99)
Potassium: 4.6 mmol/L (ref 3.5–5.1)
Sodium: 141 mmol/L (ref 135–145)
TOTAL PROTEIN: 6.8 g/dL (ref 6.5–8.1)

## 2018-08-22 LAB — LIPASE, BLOOD: Lipase: 25 U/L (ref 11–51)

## 2018-08-22 MED ORDER — IOPAMIDOL (ISOVUE-300) INJECTION 61%
100.0000 mL | Freq: Once | INTRAVENOUS | Status: AC | PRN
  Administered 2018-08-22: 100 mL via INTRAVENOUS

## 2018-08-22 MED ORDER — HYDROMORPHONE HCL 1 MG/ML IJ SOLN
0.5000 mg | Freq: Once | INTRAMUSCULAR | Status: AC
  Administered 2018-08-22: 0.5 mg via INTRAVENOUS
  Filled 2018-08-22: qty 1

## 2018-08-22 MED ORDER — TAMSULOSIN HCL 0.4 MG PO CAPS: 0.4000 mg | ORAL_CAPSULE | Freq: Every day | ORAL | 0 refills | Status: AC

## 2018-08-22 MED ORDER — ONDANSETRON 4 MG PO TBDP: 4.0000 mg | ORAL_TABLET | Freq: Three times a day (TID) | ORAL | 0 refills | Status: DC | PRN

## 2018-08-22 MED ORDER — TRAMADOL HCL 50 MG PO TABS: 50.0000 mg | ORAL_TABLET | Freq: Four times a day (QID) | ORAL | 0 refills | Status: DC | PRN

## 2018-08-22 MED ORDER — ONDANSETRON HCL 4 MG/2ML IJ SOLN
4.0000 mg | Freq: Once | INTRAMUSCULAR | Status: AC
  Administered 2018-08-22: 4 mg via INTRAVENOUS
  Filled 2018-08-22: qty 2

## 2018-08-22 MED ORDER — IOPAMIDOL (ISOVUE-300) INJECTION 61%
INTRAVENOUS | Status: AC
  Filled 2018-08-22: qty 100

## 2018-08-22 MED ORDER — SODIUM CHLORIDE 0.9 % IV BOLUS
1000.0000 mL | Freq: Once | INTRAVENOUS | Status: AC
  Administered 2018-08-22: 1000 mL via INTRAVENOUS

## 2018-08-22 MED ORDER — KETOROLAC TROMETHAMINE 15 MG/ML IJ SOLN
15.0000 mg | Freq: Once | INTRAMUSCULAR | Status: AC
  Administered 2018-08-22: 15 mg via INTRAVENOUS
  Filled 2018-08-22: qty 1

## 2018-08-22 MED ORDER — SODIUM CHLORIDE 0.9 % IJ SOLN
INTRAMUSCULAR | Status: AC
  Filled 2018-08-22: qty 50

## 2018-08-22 NOTE — ED Notes (Signed)
Bed: WP80 Expected date: 08/22/18 Expected time: 8:08 AM Means of arrival: Ambulance Comments: EMS 77 yo flank pain/kidney stone?

## 2018-08-22 NOTE — ED Triage Notes (Signed)
EMS reports from home, woke up this morning with sudden onset isolated left sided flank pain.   BP 139/88 HR 82 RR 82 Sp02 98 RA CBG 143  22ga Left Hand  100 Fentanyl 20mg  Ketamine 4mg  Zofran 281ml NS enroute

## 2018-08-22 NOTE — ED Provider Notes (Signed)
[facility] DEPT Provider Note   CSN: [id number] Arrival date & time: 08/22/18  0808     History   Chief Complaint Chief Complaint  Patient presents with  . Flank Pain    HPI Brittany Robertson is a 77 y.o. female.  HPI   77 year old female with a history of CHF, coronary artery disease, dilated aortic root, hypertension, hyper lipidemia presents with concern for left flank pain.  Patient reports that she woke with severe left flank flank pain radiating to the abdomen.  The pain is 10 out of 10.  Reports it is worse with movements, but she cannot find a comfortable position.  Reports associated nausea.  Denies vomiting, diarrhea, constipation, fevers, urinary symptoms, chest pain, shortness of breath.  Denies history of nephrolithiasis.  Has dilated aortic root, but no known history of aortic aneurysm or diverticulitis.  History of degenerative disc disease, denies numbness, weakness, loss control bowel or bladder.  Past Medical History:  Diagnosis Date  . Bruises easily   . Chronic diastolic CHF (congestive heart failure) ([place])   . Complication of anesthesia    "takes a long time towake up"  . Coronary artery disease    PCI 2006, repeat cath 30% mid LAD distal to stent otherwise normal coronary arteries, cath 08/2013 with patent LAD stent and otherwise normal coronary arteries with diastolic dysfunction. Cath 08/2016 with normal RHC, normal LVEDP, patent LAD stent with otherwise normal coronary arteries  . DDD (degenerative disc disease), cervical   . Dilated aortic root ([place])    69mm by echo 07/2017  . GERD (gastroesophageal reflux disease)   . Hyperlipidemia   . Hypertension   . OA (osteoarthritis)   . Obesity   . Osteopenia   . PONV (postoperative nausea and vomiting)    Motion sickness when move out of PACU to room  . PVC's (premature ventricular contractions)   . SOB (shortness of breath) 07/13/2016   non cardiac and secondary to  deconditioning and obesity    Patient Active Problem List   Diagnosis Date Noted  . Hypersomnia 10/30/2016  . Exertional dyspnea 10/30/2016  . Nodule of right lung 08/10/2016  . History of snoring 08/10/2016  . Sensation of lump in throat 08/10/2016  . Other fatigue 08/10/2016  . SOB (shortness of breath) 07/13/2016  . Chest crackles 06/29/2016  . Abnormal PFT 06/29/2016  . Osteoarthritis of right knee 05/10/2015  . S/P total knee replacement 05/10/2015  . S/P knee replacement 05/10/2015  . Primary osteoarthritis of left knee 01/04/2015  . S/P total knee arthroplasty 01/04/2015  . Chronic diastolic CHF (congestive heart failure) ([place])   . CAD (coronary artery disease) 09/19/2013  . Dyspnea and respiratory abnormality 08/15/2013  . Dyslipidemia, goal LDL below 70 08/15/2013  . PVC's (premature ventricular contractions)   . Fall 09/28/2011  . Multiple fractures of ribs of left side 09/28/2011  . Bilateral contusions of knee 09/28/2011  . Obesity 09/28/2011  . Hypertension   . Degenerative disk disease     Past Surgical History:  Procedure Laterality Date  . ABDOMINAL HYSTERECTOMY    . APPENDECTOMY  1957  . arthroscopic knee  05/2002,04/07/2005   right and then also torn muscle surgery on right knee (2003(  . arthroscopic knee  01/02/2005,09/21/2006   left  . CARDIAC CATHETERIZATION N/A 08/31/2016   Procedure: Right/Left Heart Cath and Coronary Angiography;  Surgeon: Sherren Mocha, MD;  Location: [facility];  Service: Cardiovascular;  Laterality: N/A;  .  CHOLECYSTECTOMY  1988  . COLONOSCOPY  2010   with Endo  . CORONARY ANGIOPLASTY  02/05/2004  . CYST REMOVAL HAND  2000  . [place] OF UTERUS  1972  . EYE SURGERY  01/2007   bilateral cataract with lens implant  . FOOT SURGERY  01/2012   left and right bunion and hammer toe  . HAND SURGERY  05/29/2011   joint removed  . [place]  . HERNIA REPAIR  02/2010   incisional hernioa    . KNEE ARTHROSCOPY    . LEFT HEART CATHETERIZATION WITH CORONARY ANGIOGRAM N/A 09/19/2013   Procedure: LEFT HEART CATHETERIZATION WITH CORONARY ANGIOGRAM;  Surgeon: Sueanne Margarita, MD;  Location: [facility];  Service: Cardiovascular;  Laterality: N/A;  . neck injections  03/2014   back and neck injections from Dr. Nelva Bush  . stents     x1  . tear duct surgery  1990's   bilateral  . TONSILLECTOMY     as child  . TOTAL KNEE ARTHROPLASTY Left 01/04/2015   Procedure: LEFT TOTAL KNEE ARTHROPLASTY;  Surgeon: Sydnee Cabal, MD;  Location: [facility];  Service: Orthopedics;  Laterality: Left;  . TOTAL KNEE ARTHROPLASTY Right 05/10/2015   Procedure: TOTAL RIGHT KNEE ARTHROPLASTY;  Surgeon: Sydnee Cabal, MD;  Location: [facility];  Service: Orthopedics;  Laterality: Right;     OB History   None      Home Medications    Prior to Admission medications   Medication Sig Start Date End Date Taking? Authorizing Provider  acetaminophen (TYLENOL) 500 MG tablet Take 500-1,000 mg by mouth daily as needed for headache.   Yes [provider]  aspirin 81 MG tablet Take 81 mg by mouth every evening.    Yes [provider]  atorvastatin (LIPITOR) 20 MG tablet Take 1 tablet (20 mg total) by mouth at bedtime. Brand Name Only 03/08/18  Yes Turner, Eber Hong, MD  cetirizine (ZYRTEC) 10 MG tablet Take 10 mg by mouth every morning.     Yes [provider]  Coenzyme Q10 Liposomal 100 MG/ML LIQD Take 100 mg by mouth every evening.   Yes [provider]  denosumab (PROLIA) 60 MG/ML SOLN injection Inject 60 mg into the skin every 6 (six) months. Administer in upper arm, thigh, or abdomen   Yes [provider]  docusate sodium (COLACE) 100 MG capsule Take 100 mg by mouth 2 (two) times daily.    Yes [provider]  esomeprazole (NEXIUM) 40 MG capsule Take 40 mg by mouth daily at 12 noon.   Yes [provider]  ezetimibe (ZETIA) 10 MG tablet TAKE 1 TABLET BY MOUTH   EVERY EVENING. 06/28/18  Yes Turner, Eber Hong, MD  fish oil-omega-3 fatty acids 1000 MG capsule Take 1 g by mouth 2 (two) times daily.     Yes [provider]  Glucosamine-Chondroit-Vit C-Mn (GLUCOSAMINE 1500 COMPLEX PO) Take 1,500 mg by mouth 2 (two) times daily.   Yes [provider]  meloxicam (MOBIC) 15 MG tablet Take 15 mg by mouth every evening.     Yes [provider]  metoprolol succinate (TOPROL XL) 25 MG 24 hr tablet Take 0.5 tablets (12.5 mg total) by mouth daily. 03/08/18  Yes Turner, Eber Hong, MD  mirabegron ER (MYRBETRIQ) 50 MG TB24 tablet Take 50 mg by mouth daily.   Yes [provider]  nitroGLYCERIN (NITROSTAT) 0.4 MG SL tablet Place 1 tablet (0.4 mg total) under the tongue  every 5 (five) minutes as needed. As needed for chest pain x 3 doses 12/07/14  Yes Turner, Eber Hong, MD  olmesartan (BENICAR) 40 MG tablet Take 1 tablet (40 mg total) by mouth daily. 03/08/18  Yes Turner, Eber Hong, MD  omeprazole (PRILOSEC OTC) 20 MG tablet Take 20 mg by mouth daily.   Yes [provider]  Polyethyl Glycol-Propyl Glycol (SYSTANE ULTRA OP) Apply 1 drop to eye 2 (two) times daily as needed (dry eyes).   Yes [provider]  potassium chloride SA (K-DUR,KLOR-CON) 20 MEQ tablet Take 1 tablet by mouth  every morning 12/04/15  Yes Turner, Eber Hong, MD  vitamin C (ASCORBIC ACID) 500 MG tablet Take 500 mg by mouth 2 (two) times daily.    Yes [provider]  vitamin E 400 UNIT capsule Take 400 Units by mouth at bedtime.    Yes [provider]  cholecalciferol (VITAMIN D) 400 UNITS TABS Take 400 Units by mouth every other day.     [provider]  furosemide (LASIX) 20 MG tablet Take 1 tablet (20 mg total) by mouth daily as needed for fluid or edema. Patient not taking: Reported on 08/22/2018 02/18/18   Sueanne Margarita, MD  ondansetron (ZOFRAN ODT) 4 MG disintegrating tablet Take 1 tablet (4 mg total) by mouth every 8 (eight) hours as  needed for nausea or vomiting. 08/22/18   Gareth Morgan, MD  tamsulosin (FLOMAX) 0.4 MG CAPS capsule Take 1 capsule (0.4 mg total) by mouth daily for 14 days. Monitor for signs of itching on this medication, there can be a risk of allergic reaction if you have history of allergy to sulfa. If you develop allergic symptoms, please discontinue use immediately. 08/22/18 09/05/18  Gareth Morgan, MD  traMADol (ULTRAM) 50 MG tablet Take 1 tablet (50 mg total) by mouth every 6 (six) hours as needed. 08/22/18   Gareth Morgan, MD    Family History Family History  Problem Relation Age of Onset  . Hypertension Mother   . Cancer Mother   . Hypertension Father   . Heart Problems Father     Social History Social History   Tobacco Use  . Smoking status: Never Smoker  . Smokeless tobacco: Never Used  Substance Use Topics  . Alcohol use: Yes    Alcohol/week: 1.0 standard drinks    Types: 1 Glasses of wine per week    Comment: occassionally  . Drug use: No     Allergies   Atorvastatin; Codeine; Sulfa antibiotics; Sulfamethoxazole; Sulfasalazine; Adhesive [tape]; Other; and Penicillins   Review of Systems Review of Systems  Constitutional: Negative for fever.  HENT: Negative for sore throat.   Eyes: Negative for visual disturbance.  Respiratory: Negative for cough and shortness of breath.   Cardiovascular: Negative for chest pain.  Gastrointestinal: Positive for nausea. Negative for abdominal pain, diarrhea and vomiting.  Genitourinary: Positive for flank pain. Negative for difficulty urinating, dysuria and frequency.  Musculoskeletal: Positive for back pain. Negative for neck pain.  Skin: Negative for rash.  Neurological: Negative for syncope and headaches.     Physical Exam Updated Vital Signs BP 133/71   Pulse 76   Temp 97.9 F (36.6 C) (Oral)   Resp 18   Ht 5\' 4"  (1.626 m)   Wt 99.8 kg   SpO2 98%   BMI 37.76 kg/m   Physical Exam  Constitutional: She is oriented to  person, place, and time. She appears well-developed and well-nourished. She appears distressed (pain).  HENT:  Head: Normocephalic and atraumatic.  Eyes: Conjunctivae and EOM are normal.  Neck: Normal range of motion.  Cardiovascular: Normal rate, regular rhythm, normal heart sounds and intact distal pulses. Exam reveals no gallop and no friction rub.  No murmur heard. Pulmonary/Chest: Effort normal and breath sounds normal. No respiratory distress. She has no wheezes. She has no rales.  Abdominal: Soft. She exhibits no distension. There is tenderness (left lower quadrant, left upper quadrant). There is guarding.  Musculoskeletal: She exhibits no edema or tenderness.  Neurological: She is alert and oriented to person, place, and time.  Skin: Skin is warm and dry. No rash noted. She is not diaphoretic. No erythema.  Nursing note and vitals reviewed.    ED Treatments / Results  Labs (all labs ordered are listed, but only abnormal results are displayed) Labs Reviewed  COMPREHENSIVE METABOLIC PANEL - Abnormal; Notable for the following components:      Result Value   Glucose, Bld 136 (*)    GFR calc non Af Amer 58 (*)    All other components within normal limits  URINALYSIS, ROUTINE W REFLEX MICROSCOPIC - Abnormal; Notable for the following components:   APPearance HAZY (*)    Specific Gravity, Urine 1.036 (*)    Hgb urine dipstick LARGE (*)    Ketones, ur 5 (*)    Leukocytes, UA MODERATE (*)    RBC / HPF >50 (*)    All other components within normal limits  URINE CULTURE  CBC WITH DIFFERENTIAL/PLATELET  LIPASE, BLOOD  I-STAT CHEM 8, ED    EKG None  Radiology Ct Abdomen Pelvis W Contrast  Result Date: 08/22/2018 CLINICAL DATA:  Left flank pain EXAM: CT ABDOMEN AND PELVIS WITH CONTRAST TECHNIQUE: Multidetector CT imaging of the abdomen and pelvis was performed using the standard protocol following bolus administration of intravenous contrast. CONTRAST:  164mL ISOVUE-300  IOPAMIDOL (ISOVUE-300) INJECTION 61% COMPARISON:  CT abdomen pelvis 09/27/2011 FINDINGS: Lower chest: Lung bases clear.  Heart size normal. Hepatobiliary: Cholecystectomy clips. No biliary dilatation. No liver lesion. Pancreas: Negative Spleen: Negative Adrenals/Urinary Tract: Moderate hydronephrosis left kidney. 4 mm obstructing stone proximal left ureter. Perinephric edema on the left. No other left renal calculi Several subcentimeter calculi right lower pole without right hydronephrosis. Normal urinary bladder. Stomach/Bowel: Small hiatal hernia. Normal stomach. Negative for bowel obstruction. Appendectomy history. No bowel mass or edema. Vascular/Lymphatic: Atherosclerotic aorta without aneurysm. No lymphadenopathy. Reproductive: Hysterectomy.  Negative for pelvic mass. Other: No free fluid.  Negative for hernia. Musculoskeletal: Lumbar disc and facet degeneration. No acute skeletal abnormality. IMPRESSION: 4 mm obstructing stone proximal left ureter. Nonobstructing calculi right lower pole Electronically Signed   By: Franchot Gallo M.D.   On: 08/22/2018 10:23    Procedures Procedures (including critical care time)  Medications Ordered in ED Medications  ondansetron (ZOFRAN) injection 4 mg (4 mg Intravenous Given 08/22/18 0905)  HYDROmorphone (DILAUDID) injection 0.5 mg (0.5 mg Intravenous Given 08/22/18 0905)  sodium chloride 0.9 % bolus 1,000 mL (0 mLs Intravenous Stopped 08/22/18 1054)  iopamidol (ISOVUE-300) 61 % injection 100 mL (100 mLs Intravenous Contrast Given 08/22/18 0959)  ketorolac (TORADOL) 15 MG/ML injection 15 mg (15 mg Intravenous Given 08/22/18 1054)     Initial Impression / Assessment and Plan / ED Course  I have reviewed the triage vital signs and the nursing notes.  Pertinent labs & imaging results that were available during my care of the patient were reviewed by me and considered in my medical decision making (  see chart for details).     77 year old female with a history  of CHF, coronary artery disease, dilated aortic root, hypertension, hyper lipidemia presents with concern for left flank pain.  Differential diagnosis includes diverticulitis with perforation, AAA, nephrolithiasis.  Labs show no sign of hepatitis, no pancreatitis.  CT abdomen pelvis done shows left ureteral stone.  Patient given fluids, one dose of dilaudid, Toradol, Zofran with improvement in pain. Urinalysis shows hematuria, no bacteria. Given no urinary symptoms, fever, WBC or bacteria seen suspect wbc seen related to hematuria/nephrolithiasis and doubt infection.  Recommend hydration, follow up with urology, given rx for flomax, zofran, tramadol, rec ibuprofen and tylenol.   Final Clinical Impressions(s) / ED Diagnoses   Final diagnoses:  Left ureteral stone    ED Discharge Orders         Ordered    tamsulosin (FLOMAX) 0.4 MG CAPS capsule  Daily     08/22/18 1241    ondansetron (ZOFRAN ODT) 4 MG disintegrating tablet  Every 8 hours PRN     08/22/18 1241    traMADol (ULTRAM) 50 MG tablet  Every 6 hours PRN     08/22/18 1241           Gareth Morgan, MD 08/22/18 2102

## 2018-08-22 NOTE — Discharge Instructions (Addendum)
Take Tylenol 1000 mg 4 times a day for 1 week. This is the maximum dose of Tylenol usually take from all sources. Please check other over-the-counter medications and prescriptions to ensure you are not taking other medications that contain acetaminophen.  You may also take ibuprofen 400 mg 6 times a day alternating with or at the same time as tylenol.  Take tramadol as needed for breakthrough pain.  This medication can be addicting, sedating and cause constipation.   °

## 2018-08-23 ENCOUNTER — Other Ambulatory Visit: Payer: Self-pay

## 2018-08-23 ENCOUNTER — Encounter (HOSPITAL_BASED_OUTPATIENT_CLINIC_OR_DEPARTMENT_OTHER): Payer: Self-pay | Admitting: *Deleted

## 2018-08-23 ENCOUNTER — Other Ambulatory Visit: Payer: Self-pay | Admitting: Urology

## 2018-08-23 LAB — URINE CULTURE

## 2018-08-23 NOTE — Progress Notes (Signed)
Spoke with spouse Herbie Baltimore Duzan per patient request Npo after midnight food, clear liquids from midnight until 845 am, then npo, arrive 1245 pm 08-26-18 [facility] meds to take: metorpolol succinate, prilosec, tramadol Records on chart/epic: echo 08-09-18, ekg 03-08-18, lov dr turner cardiology 03-08-18, lov  Pulmonary dr Chase Caller 02-15-17, chest ct 08-09-18, cbc with dif, cmet, lipase 08-22-18 Surgery orders need 2nd sign

## 2018-08-26 ENCOUNTER — Ambulatory Visit (HOSPITAL_BASED_OUTPATIENT_CLINIC_OR_DEPARTMENT_OTHER): Payer: Medicare Other | Admitting: Anesthesiology

## 2018-08-26 ENCOUNTER — Encounter (HOSPITAL_BASED_OUTPATIENT_CLINIC_OR_DEPARTMENT_OTHER): Payer: Self-pay | Admitting: *Deleted

## 2018-08-26 ENCOUNTER — Other Ambulatory Visit: Payer: Self-pay

## 2018-08-26 ENCOUNTER — Ambulatory Visit (HOSPITAL_BASED_OUTPATIENT_CLINIC_OR_DEPARTMENT_OTHER)
Admission: RE | Admit: 2018-08-26 | Discharge: 2018-08-26 | Disposition: A | Discharge status: Home / Self Care | Payer: Medicare Other | Source: Ambulatory Visit | Attending: Urology | Admitting: Urology

## 2018-08-26 ENCOUNTER — Encounter (HOSPITAL_BASED_OUTPATIENT_CLINIC_OR_DEPARTMENT_OTHER)
Admission: RE | Disposition: A | Discharge status: Home / Self Care | Payer: Self-pay | Source: Ambulatory Visit | Attending: Urology

## 2018-08-26 DIAGNOSIS — E785 Hyperlipidemia, unspecified: Secondary | ICD-10-CM | POA: Diagnosis not present

## 2018-08-26 DIAGNOSIS — E669 Obesity, unspecified: Secondary | ICD-10-CM | POA: Diagnosis not present

## 2018-08-26 DIAGNOSIS — Z880 Allergy status to penicillin: Secondary | ICD-10-CM | POA: Diagnosis not present

## 2018-08-26 DIAGNOSIS — I2510 Atherosclerotic heart disease of native coronary artery without angina pectoris: Secondary | ICD-10-CM | POA: Diagnosis not present

## 2018-08-26 DIAGNOSIS — Z955 Presence of coronary angioplasty implant and graft: Secondary | ICD-10-CM | POA: Diagnosis not present

## 2018-08-26 DIAGNOSIS — M5030 Other cervical disc degeneration, unspecified cervical region: Secondary | ICD-10-CM | POA: Diagnosis not present

## 2018-08-26 DIAGNOSIS — Z6839 Body mass index (BMI) 39.0-39.9, adult: Secondary | ICD-10-CM | POA: Insufficient documentation

## 2018-08-26 DIAGNOSIS — I5032 Chronic diastolic (congestive) heart failure: Secondary | ICD-10-CM | POA: Insufficient documentation

## 2018-08-26 DIAGNOSIS — Z882 Allergy status to sulfonamides status: Secondary | ICD-10-CM | POA: Diagnosis not present

## 2018-08-26 DIAGNOSIS — N201 Calculus of ureter: Secondary | ICD-10-CM | POA: Insufficient documentation

## 2018-08-26 DIAGNOSIS — M8580 Other specified disorders of bone density and structure, unspecified site: Secondary | ICD-10-CM | POA: Insufficient documentation

## 2018-08-26 DIAGNOSIS — K219 Gastro-esophageal reflux disease without esophagitis: Secondary | ICD-10-CM | POA: Insufficient documentation

## 2018-08-26 DIAGNOSIS — I110 Hypertensive heart disease with heart failure: Secondary | ICD-10-CM | POA: Insufficient documentation

## 2018-08-26 DIAGNOSIS — M1990 Unspecified osteoarthritis, unspecified site: Secondary | ICD-10-CM | POA: Insufficient documentation

## 2018-08-26 HISTORY — DX: Calculus of ureter: N20.1

## 2018-08-26 HISTORY — PX: CYSTOSCOPY/URETEROSCOPY/HOLMIUM LASER/STENT PLACEMENT: SHX6546

## 2018-08-26 SURGERY — CYSTOSCOPY/URETEROSCOPY/HOLMIUM LASER/STENT PLACEMENT
Anesthesia: General | Site: Renal | Laterality: Left

## 2018-08-26 MED ORDER — LIDOCAINE 2% (20 MG/ML) 5 ML SYRINGE
INTRAMUSCULAR | Status: AC
  Filled 2018-08-26: qty 5

## 2018-08-26 MED ORDER — CIPROFLOXACIN IN D5W 400 MG/200ML IV SOLN
INTRAVENOUS | Status: AC
  Filled 2018-08-26: qty 200

## 2018-08-26 MED ORDER — MIDAZOLAM HCL 2 MG/2ML IJ SOLN
INTRAMUSCULAR | Status: AC
  Filled 2018-08-26: qty 2

## 2018-08-26 MED ORDER — ONDANSETRON HCL 4 MG/2ML IJ SOLN
INTRAMUSCULAR | Status: DC | PRN
  Administered 2018-08-26: 4 mg via INTRAVENOUS

## 2018-08-26 MED ORDER — DEXAMETHASONE SODIUM PHOSPHATE 10 MG/ML IJ SOLN
INTRAMUSCULAR | Status: AC
  Filled 2018-08-26: qty 1

## 2018-08-26 MED ORDER — LACTATED RINGERS IV SOLN
INTRAVENOUS | Status: DC
  Administered 2018-08-26: 14:00:00 via INTRAVENOUS
  Filled 2018-08-26: qty 1000

## 2018-08-26 MED ORDER — SODIUM CHLORIDE 0.9 % IR SOLN
Status: DC | PRN
  Administered 2018-08-26: 3000 mL

## 2018-08-26 MED ORDER — LIDOCAINE HCL (CARDIAC) PF 100 MG/5ML IV SOSY
PREFILLED_SYRINGE | INTRAVENOUS | Status: DC | PRN
  Administered 2018-08-26: 50 mg via INTRAVENOUS

## 2018-08-26 MED ORDER — CIPROFLOXACIN IN D5W 400 MG/200ML IV SOLN
400.0000 mg | Freq: Once | INTRAVENOUS | Status: AC
  Administered 2018-08-26: 400 mg via INTRAVENOUS
  Filled 2018-08-26: qty 200

## 2018-08-26 MED ORDER — FENTANYL CITRATE (PF) 100 MCG/2ML IJ SOLN
INTRAMUSCULAR | Status: AC
  Filled 2018-08-26: qty 2

## 2018-08-26 MED ORDER — CIPROFLOXACIN HCL 500 MG PO TABS: 500.0000 mg | ORAL_TABLET | Freq: Two times a day (BID) | ORAL | 0 refills | Status: AC

## 2018-08-26 MED ORDER — PROPOFOL 10 MG/ML IV BOLUS
INTRAVENOUS | Status: AC
  Filled 2018-08-26: qty 20

## 2018-08-26 MED ORDER — IOHEXOL 300 MG/ML  SOLN
INTRAMUSCULAR | Status: DC | PRN
  Administered 2018-08-26: 10 mL

## 2018-08-26 MED ORDER — ONDANSETRON HCL 4 MG/2ML IJ SOLN
INTRAMUSCULAR | Status: AC
  Filled 2018-08-26: qty 2

## 2018-08-26 MED ORDER — FENTANYL CITRATE (PF) 100 MCG/2ML IJ SOLN
INTRAMUSCULAR | Status: DC | PRN
  Administered 2018-08-26 (×2): 50 ug via INTRAVENOUS

## 2018-08-26 MED ORDER — PROPOFOL 10 MG/ML IV BOLUS
INTRAVENOUS | Status: DC | PRN
  Administered 2018-08-26: 110 mg via INTRAVENOUS

## 2018-08-26 MED ORDER — EPHEDRINE 5 MG/ML INJ
INTRAVENOUS | Status: AC
  Filled 2018-08-26: qty 10

## 2018-08-26 MED ORDER — PHENAZOPYRIDINE HCL 200 MG PO TABS: 200.0000 mg | ORAL_TABLET | Freq: Three times a day (TID) | ORAL | 0 refills | Status: DC | PRN

## 2018-08-26 MED ORDER — FENTANYL CITRATE (PF) 100 MCG/2ML IJ SOLN
25.0000 ug | INTRAMUSCULAR | Status: DC | PRN
  Filled 2018-08-26: qty 1

## 2018-08-26 MED ORDER — HYDRALAZINE HCL 20 MG/ML IJ SOLN
INTRAMUSCULAR | Status: AC
  Filled 2018-08-26: qty 1

## 2018-08-26 MED ORDER — OXYBUTYNIN CHLORIDE 5 MG PO TABS: 5.0000 mg | ORAL_TABLET | Freq: Three times a day (TID) | ORAL | 1 refills | Status: DC | PRN

## 2018-08-26 MED ORDER — DEXAMETHASONE SODIUM PHOSPHATE 10 MG/ML IJ SOLN
INTRAMUSCULAR | Status: DC | PRN
  Administered 2018-08-26: 4 mg via INTRAVENOUS

## 2018-08-26 MED ORDER — EPHEDRINE SULFATE 50 MG/ML IJ SOLN
INTRAMUSCULAR | Status: DC | PRN
  Administered 2018-08-26: 10 mg via INTRAVENOUS

## 2018-08-26 MED ORDER — HYDRALAZINE HCL 20 MG/ML IJ SOLN
10.0000 mg | Freq: Once | INTRAMUSCULAR | Status: AC
  Administered 2018-08-26: 10 mg via INTRAVENOUS
  Filled 2018-08-26: qty 0.5

## 2018-08-26 SURGICAL SUPPLY — 30 items
APL SKNCLS STERI-STRIP NONHPOA (GAUZE/BANDAGES/DRESSINGS)
BAG DRAIN URO-CYSTO SKYTR STRL (DRAIN) ×1 IMPLANT
BAG DRN UROCATH (DRAIN)
BASKET STONE 1.7 NGAGE (UROLOGICAL SUPPLIES) IMPLANT
BASKET ZERO TIP NITINOL 2.4FR (BASKET) ×3 IMPLANT
BENZOIN TINCTURE PRP APPL 2/3 (GAUZE/BANDAGES/DRESSINGS) IMPLANT
BSKT STON RTRVL ZERO TP 2.4FR (BASKET) ×1
CATH URET 5FR 28IN OPEN ENDED (CATHETERS) ×2 IMPLANT
CLOSURE WOUND 1/2 X4 (GAUZE/BANDAGES/DRESSINGS)
CLOTH BEACON ORANGE TIMEOUT ST (SAFETY) ×3 IMPLANT
FIBER LASER FLEXIVA 365 (UROLOGICAL SUPPLIES) IMPLANT
FIBER LASER TRAC TIP (UROLOGICAL SUPPLIES) IMPLANT
GLOVE BIO SURGEON STRL SZ7.5 (GLOVE) ×3 IMPLANT
GOWN STRL REUS W/TWL XL LVL3 (GOWN DISPOSABLE) ×5 IMPLANT
GUIDEWIRE ANG ZIPWIRE 038X150 (WIRE) ×2 IMPLANT
GUIDEWIRE STR DUAL SENSOR (WIRE) IMPLANT
GUIDEWIRE ZIPWRE .038 STRAIGHT (WIRE) ×3 IMPLANT
IV NS 1000ML (IV SOLUTION)
IV NS 1000ML BAXH (IV SOLUTION) IMPLANT
IV NS IRRIG 3000ML ARTHROMATIC (IV SOLUTION) ×5 IMPLANT
KIT TURNOVER CYSTO (KITS) ×3 IMPLANT
MANIFOLD NEPTUNE II (INSTRUMENTS) ×3 IMPLANT
NS IRRIG 500ML POUR BTL (IV SOLUTION) ×6 IMPLANT
PACK CYSTO (CUSTOM PROCEDURE TRAY) ×3 IMPLANT
STENT URET 6FRX24 CONTOUR (STENTS) ×2 IMPLANT
STRIP CLOSURE SKIN 1/2X4 (GAUZE/BANDAGES/DRESSINGS) IMPLANT
SYR 10ML LL (SYRINGE) ×3 IMPLANT
TUBE CONNECTING 12'X1/4 (SUCTIONS)
TUBE CONNECTING 12X1/4 (SUCTIONS) IMPLANT
TUBING UROLOGY SET (TUBING) ×3 IMPLANT

## 2018-08-26 NOTE — Op Note (Signed)
Operative Note  Preoperative diagnosis:  1.  4 mm left UPJ calculus  Postoperative diagnosis: 1.  4 mm left UVJ calculus  Procedure(s): 1.  Cystoscopy with left ureteroscopy, holmium laser lithotripsy and left JJ stent placement 2.  Left retrograde pyelogram with intraoperative interpretation of fluoroscopic imaging  Surgeon: Ellison Hughs, MD  Assistants:  None  Anesthesia:  General  Complications:  None  EBL: Less than 5 mL  Specimens: 1.  Left ureteral stone  Drains/Catheters: 1.  Left 6 French by 24 cm JJ stent without tether  Intraoperative findings:   1. Obstructing 4 mm left UVJ calculus 2. Left retrograde pyelogram revealed a filling defect and dilation within the distal aspect of the left ureter, suspicious for an obstructing stone.  The proximal aspects of the ureter, the left renal pelvis and its associated calyces showed no other filling defects.  Indication:  Brittany Robertson is a 77 y.o. female with a one-week history of worsening left-sided flank pain secondary to a 4 mm obstructing UPJ calculus seen on recent CT scan.  She has been consented for the above procedures, voices understanding wishes to proceed.  Description of procedure:  After informed consent was obtained, the patient was brought to the operating room and general LMA anesthesia was administered. The patient was then placed in the dorsolithotomy position and prepped and draped in usual sterile fashion. A timeout was performed. A 23 French rigid cystoscope was then inserted into the urethral meatus and advanced into the bladder under direct vision. A complete bladder survey revealed no intravesical pathology.  A 5 French ureteral catheter was then inserted into the left ureteral orifice and a left retrograde pyelogram was obtained, with the findings listed above.  A Glidewire was then used to intubate the lumen of the ureteral catheter and was advanced up to the left renal pelvis, under  fluoroscopic guidance.  The rigid ureteroscope was then removed and a semirigid ureteroscope was then advanced into the distal aspects of the left ureter where her obstructing stone is identified.  A 365 m holmium laser was then used to fracture the stone into several smaller pieces.  A 0 tip basket was then used to extract all stone fragments from the lumen of the left ureter.  The semirigid ureteroscope was then removed.  A 6 French by 24 cm JJ stent was then placed over the wire and into good position within the left collecting system, confirming placement via fluoroscopy.  The patient's bladder was drained and all stone fragments were removed.  She tolerated the procedure well was transferred to the postanesthesia in stable condition.  Plan: Follow-up in 1 week for office cystoscopy and stent removal.  Follow-up in 6 weeks with left renal ultrasound

## 2018-08-26 NOTE — H&P (Signed)
Urology Preoperative H&P   Chief Complaint: left flank pain  History of Present Illness: Brittany Robertson is a 77 y.o. female that was seen at the emergency department on 08/22/2018 with left-sided flank pain. She had a CT stone study that revealed a 4 mm proximal left ureteral calculus. The stone is faintly visible on [facility].   The patient states that her symptoms started around 6 AM yesterday. She reports intermittent, sharp left-sided flank pain with radiation into the left inguinal region. Her pain is associated with nausea/vomiting that is improved somewhat today. Her pain is also improved with tramadol and Tylenol every 6 hours. She has no prior history of stones. She was unable to provide a urine specimen today. She is AFVSS today in the office.   Labs 08/22/18  Serum creatinine-0.91  WBC-8.4  UA- positive for blood and leukocytes, but no other overt signs of cystitis   CT ABD/PEL W/O 08/22/18  IMPRESSION:  4 mm obstructing stone in the proximal left ureter.    Past Medical History:  Diagnosis Date  . Bruises easily   . Chronic diastolic CHF (congestive heart failure) ([place])   . Complication of anesthesia    "takes a long time to wake up"bp little low when wakes up  . Coronary artery disease    PCI 2006, repeat cath 30% mid LAD distal to stent otherwise normal coronary arteries, cath 08/2013 with patent LAD stent and otherwise normal coronary arteries with diastolic dysfunction. Cath 08/2016 with normal RHC, normal LVEDP, patent LAD stent with otherwise normal coronary arteries  . DDD (degenerative disc disease), cervical   . Dilated aortic root ([place])    37mm by echo 07/2017  . GERD (gastroesophageal reflux disease)   . Hyperlipidemia   . Hypertension   . Left ureteral stone   . OA (osteoarthritis)   . Obesity   . Osteopenia   . PONV (postoperative nausea and vomiting)    Motion sickness when move out of PACU to room  . PVC's (premature ventricular contractions)   .  SOB (shortness of breath) 07/13/2016   non cardiac and secondary to deconditioning and obesity    Past Surgical History:  Procedure Laterality Date  . ABDOMINAL HYSTERECTOMY    . APPENDECTOMY  1957  . arthroscopic knee  05/2002,04/07/2005   right and then also torn muscle surgery on right knee (2003(  . arthroscopic knee  01/02/2005,09/21/2006   left  . CARDIAC CATHETERIZATION N/A 08/31/2016   Procedure: Right/Left Heart Cath and Coronary Angiography;  Surgeon: Sherren Mocha, MD;  Location: [facility] [facility];  Service: Cardiovascular;  Laterality: N/A;  . CHOLECYSTECTOMY  1988  . COLONOSCOPY  2010   with Endo  . CORONARY ANGIOPLASTY  02/05/2004  . CYST REMOVAL HAND  2000  . [place] OF UTERUS  1972  . EYE SURGERY  01/2007   bilateral cataract with lens implant  . FOOT SURGERY  01/2012   left and right bunion and hammer toe  . HAND SURGERY  05/29/2011   joint removed  . [place]  . HERNIA REPAIR  02/2010   incisional hernioa  . KNEE ARTHROSCOPY    . LEFT HEART CATHETERIZATION WITH CORONARY ANGIOGRAM N/A 09/19/2013   Procedure: LEFT HEART CATHETERIZATION WITH CORONARY ANGIOGRAM;  Surgeon: Sueanne Margarita, MD;  Location: [facility];  Service: Cardiovascular;  Laterality: N/A;  . neck injections  03/2014   back and neck injections from Dr. Nelva Bush  . stents  x1  . tear duct surgery  1990's   bilateral  . TONSILLECTOMY     as child  . TOTAL KNEE ARTHROPLASTY Left 01/04/2015   Procedure: LEFT TOTAL KNEE ARTHROPLASTY;  Surgeon: Sydnee Cabal, MD;  Location: [facility];  Service: Orthopedics;  Laterality: Left;  . TOTAL KNEE ARTHROPLASTY Right 05/10/2015   Procedure: TOTAL RIGHT KNEE ARTHROPLASTY;  Surgeon: Sydnee Cabal, MD;  Location: [facility];  Service: Orthopedics;  Laterality: Right;    Allergies:  Allergies  Allergen Reactions  . Atorvastatin Other (See Comments)    GENERIC causes severe skin rash but can take Brand Name  . Codeine Nausea And  Vomiting  . Sulfa Antibiotics Hives and Itching  . Sulfamethoxazole Hives  . Sulfasalazine Itching and Hives  . Adhesive [Tape] Itching, Rash and Other (See Comments)    Skin irritation  . Other Rash    Surgical tape  . Penicillins Hives, Itching and Rash    Has patient had a PCN reaction causing immediate rash, facial/tongue/throat swelling, SOB or lightheadedness with hypotension: Yes Has patient had a PCN reaction causing severe rash involving mucus membranes or skin necrosis: Yes Has patient had a PCN reaction that required hospitalization No Has patient had a PCN reaction occurring within the last 10 years: No If all of the above answers are "NO", then may proceed with Cephalosporin use.     Family History  Problem Relation Age of Onset  . Hypertension Mother   . Cancer Mother   . Hypertension Father   . Heart Problems Father     Social History:  reports that she has never smoked. She has never used smokeless tobacco. She reports that she drinks about 1.0 standard drinks of alcohol per week. She reports that she does not use drugs.  ROS: A complete review of systems was performed.  All systems are negative except for pertinent findings as noted.  Physical Exam:  Vital signs in last 24 hours: Temp:  [97.5 F (36.4 C)] 97.5 F (36.4 C) (11/08 1227) Pulse Rate:  [74] 74 (11/08 1227) Resp:  [16] 16 (11/08 1227) BP: (163)/(107) 163/107 (11/08 1227) SpO2:  [100 %] 100 % (11/08 1227) Weight:  [104.9 kg] 104.9 kg (11/08 1227) Constitutional:  Alert and oriented, No acute distress Cardiovascular: Regular rate and rhythm, No JVD Respiratory: Normal respiratory effort, Lungs clear bilaterally GI: Abdomen is soft, nontender, nondistended, no abdominal masses GU: No CVA tenderness Lymphatic: No lymphadenopathy Neurologic: Grossly intact, no focal deficits Psychiatric: Normal mood and affect  Laboratory Data:  No results for input(s): WBC, HGB, HCT, PLT in the last 72  hours.  No results for input(s): NA, K, CL, GLUCOSE, BUN, CALCIUM, CREATININE in the last 72 hours.  Invalid input(s): CO3   No results found for this or any previous visit (from the past 24 hour(s)). Recent Results (from the past 240 hour(s))  Urine culture     Status: Abnormal   Collection Time: 08/22/18 11:18 AM  Result Value Ref Range Status   Specimen Description   Final    URINE, CLEAN CATCH Performed at [facility], [place] [address], [place], [place] [address]    Special Requests   Final    NONE Performed at [facility], [place] [address], [place], [place] [address]    Culture MULTIPLE SPECIES PRESENT, SUGGEST RECOLLECTION (A)  Final   Report Status 08/23/2018 FINAL  Final    Renal Function: Recent Labs    08/22/18 0857  CREATININE  0.91   Estimated Creatinine Clearance: 59.2 mL/min (by C-G formula based on SCr of 0.91 mg/dL).  Radiologic Imaging: No results found.  I independently reviewed the above imaging studies.  Assessment and Plan Brittany Robertson is a 77 y.o. female with a 4 mm left UPJ stone   The risks, benefits and alternatives of cystoscopy with LEFT ureteroscopy, laser lithotripsy and ureteral stent placement was discussed the patient.  Risks included, but are not limited to: bleeding, urinary tract infection, ureteral injury/avulsion, ureteral stricture formation, retained stone fragments, the possibility that multiple surgeries may be required to treat the stone(s), MI, stroke, PE and the inherent risks of general anesthesia.  The patient voices understanding and wishes to proceed.      Ellison Hughs, MD 08/26/2018, 1:42 PM  [facility] Pager: [phone]

## 2018-08-26 NOTE — Anesthesia Procedure Notes (Signed)
Procedure Name: LMA Insertion Date/Time: 08/26/2018 3:05 PM Performed by: Jonna Munro, CRNA Pre-anesthesia Checklist: Patient identified, Emergency Drugs available, Suction available, Patient being monitored and Timeout performed Patient Re-evaluated:Patient Re-evaluated prior to induction Oxygen Delivery Method: Circle system utilized Preoxygenation: Pre-oxygenation with 100% oxygen Induction Type: IV induction LMA: LMA inserted LMA Size: 4.0 Number of attempts: 1 Placement Confirmation: positive ETCO2 and breath sounds checked- equal and bilateral Tube secured with: Tape Dental Injury: Teeth and Oropharynx as per pre-operative assessment

## 2018-08-26 NOTE — Discharge Instructions (Signed)

## 2018-08-26 NOTE — Anesthesia Preprocedure Evaluation (Addendum)
Anesthesia Evaluation  Patient identified by MRN, date of birth, ID band Patient awake    Reviewed: Allergy & Precautions, NPO status , Patient's Chart, lab work & pertinent test results  History of Anesthesia Complications (+) PONV  Airway Mallampati: III  TM Distance: >3 FB Neck ROM: Full    Dental no notable dental hx. (+) Teeth Intact, Dental Advisory Given   Pulmonary neg pulmonary ROS,    Pulmonary exam normal breath sounds clear to auscultation       Cardiovascular hypertension, + CAD, + Cardiac Stents (2006) and +CHF  Normal cardiovascular exam Rhythm:Regular Rate:Normal  TTE 07/2018 - Left ventricle: The cavity size was normal. Wall thickness was increased in a pattern of mild LVH. Systolic function was normal. The estimated ejection fraction was in the range of 60% to 65%. Wall motion was normal; there were no regional wall motion abnormalities. Features are consistent with a pseudonormal left ventricular filling pattern, with concomitant abnormal relaxation and increased filling pressure (grade 2 diastolic dysfunction). - Aortic valve: There was trivial regurgitation.  LHC/RHC 2017 1. Continued patency of the stented segment in the LAD 2. Patent coronary arteries with minimal irregularity 3. Normal LV systolic function with normal LVEDP 4. Normal right heart pressures   Neuro/Psych negative neurological ROS  negative psych ROS   GI/Hepatic Neg liver ROS, GERD  ,  Endo/Other  negative endocrine ROS  Renal/GU negative Renal ROS  negative genitourinary   Musculoskeletal  (+) Arthritis , Osteoarthritis,    Abdominal   Peds  Hematology negative hematology ROS (+)   Anesthesia Other Findings Left ureteral stone  Reproductive/Obstetrics                            Anesthesia Physical Anesthesia Plan  ASA: III  Anesthesia Plan: General   Post-op Pain Management:    Induction:  Intravenous  PONV Risk Score and Plan: 4 or greater and Dexamethasone, Ondansetron and Treatment may vary due to age or medical condition  Airway Management Planned: LMA and Oral ETT  Additional Equipment:   Intra-op Plan:   Post-operative Plan: Extubation in OR  Informed Consent: I have reviewed the patients History and Physical, chart, labs and discussed the procedure including the risks, benefits and alternatives for the proposed anesthesia with the patient or authorized representative who has indicated his/her understanding and acceptance.   Dental advisory given  Plan Discussed with: CRNA  Anesthesia Plan Comments:         Anesthesia Quick Evaluation

## 2018-08-26 NOTE — Transfer of Care (Signed)
Immediate Anesthesia Transfer of Care Note  Patient: Brittany Robertson  Procedure(s) Performed: CYSTOSCOPY/RETROGRADE/URETEROSCOPY/HOLMIUM LASER/ STONE BASKETRY/ STENT PLACEMENT (Left Renal)  Patient Location: PACU  Anesthesia Type:General  Level of Consciousness: awake, alert  and oriented  Airway & Oxygen Therapy: Patient Spontanous Breathing and Patient connected to nasal cannula oxygen  Post-op Assessment: Report given to RN and Post -op Vital signs reviewed and stable  Post vital signs: Reviewed and stable  Last Vitals:  Vitals Value Taken Time  BP    Temp    Pulse    Resp    SpO2      Last Pain:  Vitals:   08/26/18 1309  TempSrc:   PainSc: 4       Patients Stated Pain Goal: 4 (82/08/13 8871)  Complications: No apparent anesthesia complications

## 2018-08-28 NOTE — Anesthesia Postprocedure Evaluation (Signed)
Anesthesia Post Note  Patient: Brittany Robertson  Procedure(s) Performed: CYSTOSCOPY/RETROGRADE/URETEROSCOPY/HOLMIUM LASER/ STONE BASKETRY/ STENT PLACEMENT (Left Renal)     Patient location during evaluation: PACU Anesthesia Type: General Level of consciousness: awake and alert Pain management: pain level controlled Vital Signs Assessment: post-procedure vital signs reviewed and stable Respiratory status: spontaneous breathing, nonlabored ventilation, respiratory function stable and patient connected to nasal cannula oxygen Cardiovascular status: blood pressure returned to baseline and stable Postop Assessment: no apparent nausea or vomiting Anesthetic complications: no    Last Vitals:  Vitals:   08/26/18 1710 08/26/18 1740  BP: (!) 166/108 (!) 164/105  Pulse: 78 74  Resp:  16  Temp:  36.7 C  SpO2:  100%    Last Pain:  Vitals:   08/26/18 1740  TempSrc: Oral  PainSc: 0-No pain                 Janyth Riera S

## 2018-08-29 ENCOUNTER — Encounter (HOSPITAL_BASED_OUTPATIENT_CLINIC_OR_DEPARTMENT_OTHER): Payer: Self-pay | Admitting: Urology

## 2018-11-08 ENCOUNTER — Other Ambulatory Visit: Payer: Self-pay | Admitting: Urology

## 2018-11-30 ENCOUNTER — Other Ambulatory Visit: Payer: Self-pay | Admitting: Family Medicine

## 2018-11-30 DIAGNOSIS — Z1231 Encounter for screening mammogram for malignant neoplasm of breast: Secondary | ICD-10-CM

## 2018-12-12 ENCOUNTER — Other Ambulatory Visit (HOSPITAL_COMMUNITY): Payer: Self-pay | Admitting: Radiology

## 2018-12-12 ENCOUNTER — Other Ambulatory Visit: Payer: Self-pay

## 2018-12-12 ENCOUNTER — Encounter (HOSPITAL_COMMUNITY)
Admission: RE | Disposition: A | Discharge status: Home / Self Care | Payer: Self-pay | Source: Home / Self Care | Attending: Urology

## 2018-12-12 ENCOUNTER — Ambulatory Visit (HOSPITAL_COMMUNITY): Payer: Medicare Other

## 2018-12-12 ENCOUNTER — Ambulatory Visit (HOSPITAL_COMMUNITY)
Admission: RE | Admit: 2018-12-12 | Discharge: 2018-12-12 | Disposition: A | Discharge status: Home / Self Care | Payer: Medicare Other | Attending: Urology | Admitting: Urology

## 2018-12-12 ENCOUNTER — Encounter (HOSPITAL_COMMUNITY): Payer: Self-pay | Admitting: *Deleted

## 2018-12-12 DIAGNOSIS — I2510 Atherosclerotic heart disease of native coronary artery without angina pectoris: Secondary | ICD-10-CM | POA: Insufficient documentation

## 2018-12-12 DIAGNOSIS — N200 Calculus of kidney: Secondary | ICD-10-CM | POA: Diagnosis not present

## 2018-12-12 DIAGNOSIS — Z96653 Presence of artificial knee joint, bilateral: Secondary | ICD-10-CM | POA: Insufficient documentation

## 2018-12-12 DIAGNOSIS — I5032 Chronic diastolic (congestive) heart failure: Secondary | ICD-10-CM | POA: Diagnosis not present

## 2018-12-12 DIAGNOSIS — Z955 Presence of coronary angioplasty implant and graft: Secondary | ICD-10-CM | POA: Insufficient documentation

## 2018-12-12 DIAGNOSIS — I110 Hypertensive heart disease with heart failure: Secondary | ICD-10-CM | POA: Insufficient documentation

## 2018-12-12 HISTORY — PX: EXTRACORPOREAL SHOCK WAVE LITHOTRIPSY: SHX1557

## 2018-12-12 HISTORY — DX: Personal history of urinary calculi: Z87.442

## 2018-12-12 SURGERY — LITHOTRIPSY, ESWL
Anesthesia: LOCAL | Laterality: Right

## 2018-12-12 MED ORDER — DIPHENHYDRAMINE HCL 25 MG PO CAPS
25.0000 mg | ORAL_CAPSULE | ORAL | Status: AC
  Administered 2018-12-12: 25 mg via ORAL
  Filled 2018-12-12: qty 1

## 2018-12-12 MED ORDER — SODIUM CHLORIDE 0.9 % IV SOLN
INTRAVENOUS | Status: DC
  Administered 2018-12-12: 09:00:00 via INTRAVENOUS

## 2018-12-12 MED ORDER — CIPROFLOXACIN HCL 500 MG PO TABS
500.0000 mg | ORAL_TABLET | ORAL | Status: AC
  Administered 2018-12-12: 500 mg via ORAL
  Filled 2018-12-12: qty 1

## 2018-12-12 MED ORDER — DIAZEPAM 5 MG PO TABS
10.0000 mg | ORAL_TABLET | ORAL | Status: AC
  Administered 2018-12-12: 10 mg via ORAL
  Filled 2018-12-12: qty 2

## 2018-12-12 NOTE — Op Note (Signed)
[facility] Operative Note  Treating Physician: Ellison Hughs, MD  Pre-op diagnosis: 8 mm right lower pole renal stone  Post-op diagnosis: Same   Procedure: RIGHT ESWL  See Aris Everts OP note scanned into chart. Also because of the size, density, location and other factors that cannot be anticipated I feel this will likely be a staged procedure. This fact supersedes any indication in the scanned [place] stone operative note to the contrary

## 2018-12-12 NOTE — H&P (Signed)
Urology Preoperative H&P   Chief Complaint: Kidney stones  History of Present Illness: Brittany Robertson is a 78 y.o. female with a history of kidney stones.  She recently underwent left ureteroscopy for an obstructing ureteral calculus and was also found to have a cluster of non-obstructing right renal stones.  She is here today for treatment of her right renal stones.  She denies interval episodes of flank pain, nausea/vomiting, fever/chills, dysuria or hematuria.    Past Medical History:  Diagnosis Date  . Bruises easily   . Chronic diastolic CHF (congestive heart failure) ([place])   . Complication of anesthesia    "takes a long time to wake up"bp little low when wakes up  . Coronary artery disease    PCI 2006, repeat cath 30% mid LAD distal to stent otherwise normal coronary arteries, cath 08/2013 with patent LAD stent and otherwise normal coronary arteries with diastolic dysfunction. Cath 08/2016 with normal RHC, normal LVEDP, patent LAD stent with otherwise normal coronary arteries  . DDD (degenerative disc disease), cervical   . Dilated aortic root ([place])    62mm by echo 07/2017  . GERD (gastroesophageal reflux disease)   . Hyperlipidemia   . Hypertension   . Left ureteral stone   . OA (osteoarthritis)   . Obesity   . Osteopenia   . PONV (postoperative nausea and vomiting)    Motion sickness when move out of PACU to room  . PVC's (premature ventricular contractions)   . SOB (shortness of breath) 07/13/2016   non cardiac and secondary to deconditioning and obesity    Past Surgical History:  Procedure Laterality Date  . ABDOMINAL HYSTERECTOMY    . APPENDECTOMY  1957  . arthroscopic knee  05/2002,04/07/2005   right and then also torn muscle surgery on right knee (2003(  . arthroscopic knee  01/02/2005,09/21/2006   left  . CARDIAC CATHETERIZATION N/A 08/31/2016   Procedure: Right/Left Heart Cath and Coronary Angiography;  Surgeon: Sherren Mocha, MD;  Location: [place] [facility];  Service: Cardiovascular;  Laterality: N/A;  . CHOLECYSTECTOMY  1988  . COLONOSCOPY  2010   with Endo  . CORONARY ANGIOPLASTY  02/05/2004  . CYST REMOVAL HAND  2000  . CYSTOSCOPY/URETEROSCOPY/HOLMIUM LASER/STENT PLACEMENT Left 08/26/2018   Procedure: CYSTOSCOPY/RETROGRADE/URETEROSCOPY/HOLMIUM LASER/ STONE BASKETRY/ STENT PLACEMENT;  Surgeon: Ceasar Mons, MD;  Location: [facility];  Service: Urology;  Laterality: Left;  ONLY NEEDS  45 MIN  . [place] OF UTERUS  1972  . EYE SURGERY  01/2007   bilateral cataract with lens implant  . FOOT SURGERY  01/2012   left and right bunion and hammer toe  . HAND SURGERY  05/29/2011   joint removed  . [place]  . HERNIA REPAIR  02/2010   incisional hernioa  . KNEE ARTHROSCOPY    . LEFT HEART CATHETERIZATION WITH CORONARY ANGIOGRAM N/A 09/19/2013   Procedure: LEFT HEART CATHETERIZATION WITH CORONARY ANGIOGRAM;  Surgeon: Sueanne Margarita, MD;  Location: [facility];  Service: Cardiovascular;  Laterality: N/A;  . neck injections  03/2014   back and neck injections from Dr. Nelva Bush  . stents     x1  . tear duct surgery  1990's   bilateral  . TONSILLECTOMY     as child  . TOTAL KNEE ARTHROPLASTY Left 01/04/2015   Procedure: LEFT TOTAL KNEE ARTHROPLASTY;  Surgeon: Sydnee Cabal, MD;  [facility];  Service: Orthopedics;  Laterality: Left;  . TOTAL KNEE ARTHROPLASTY  Right 05/10/2015   Procedure: TOTAL RIGHT KNEE ARTHROPLASTY;  Surgeon: Sydnee Cabal, MD;  [facility];  Service: Orthopedics;  Laterality: Right;    Allergies:  Allergies  Allergen Reactions  . Atorvastatin Other (See Comments)    GENERIC causes severe skin rash but can take Brand Name  . Codeine Nausea And Vomiting  . Sulfa Antibiotics Hives and Itching  . Sulfamethoxazole Hives  . Sulfasalazine Itching and Hives  . Adhesive [Tape] Itching, Rash and Other (See Comments)    Skin irritation  . Other Rash     Surgical tape  . Penicillins Hives, Itching and Rash    Has patient had a PCN reaction causing immediate rash, facial/tongue/throat swelling, SOB or lightheadedness with hypotension: Yes Has patient had a PCN reaction causing severe rash involving mucus membranes or skin necrosis: Yes Has patient had a PCN reaction that required hospitalization No Has patient had a PCN reaction occurring within the last 10 years: No If all of the above answers are "NO", then may proceed with Cephalosporin use.     Family History  Problem Relation Age of Onset  . Hypertension Mother   . Cancer Mother   . Hypertension Father   . Heart Problems Father     Social History:  reports that she has never smoked. She has never used smokeless tobacco. She reports current alcohol use of about 1.0 standard drinks of alcohol per week. She reports that she does not use drugs.  ROS: A complete review of systems was performed.  All systems are negative except for pertinent findings as noted.  Physical Exam:  Vital signs in last 24 hours: BP: ()/()  Arterial Line BP: ()/()  Constitutional:  Alert and oriented, No acute distress Cardiovascular: Regular rate and rhythm, No JVD Respiratory: Normal respiratory effort, Lungs clear bilaterally GI: Abdomen is soft, nontender, nondistended, no abdominal masses GU: No CVA tenderness Lymphatic: No lymphadenopathy Neurologic: Grossly intact, no focal deficits Psychiatric: Normal mood and affect  Laboratory Data:  No results for input(s): WBC, HGB, HCT, PLT in the last 72 hours.  No results for input(s): NA, K, CL, GLUCOSE, BUN, CALCIUM, CREATININE in the last 72 hours.  Invalid input(s): CO3   No results found for this or any previous visit (from the past 24 hour(s)). No results found for this or any previous visit (from the past 240 hour(s)).  Renal Function: No results for input(s): CREATININE in the last 168 hours. CrCl cannot be calculated (Patient's most  recent lab result is older than the maximum 21 days allowed.).  Radiologic Imaging: No results found.  I independently reviewed the above imaging studies.  Assessment and Plan Brittany Robertson is a 78 y.o. female with an 8 mm cluster of right renal stones  The risks, benefits and alternatives of RIGHT ESWL was discussed with the patient. I described the risks which include arrhythmia, kidney contusion, kidney hemorrhage, need for transfusion, back discomfort, flank ecchymosis, flank abrasion, inability to fracture the stone, inability to pass stone fragments, [place], infection associated with obstructing stones, need for an alternative surgical procedure and possible need for repeat shockwave lithotripsy.  The patient voices understanding and wishes to proceed.      Ellison Hughs, MD 12/12/2018, 6:20 AM  [facility] Pager: [phone]

## 2018-12-13 ENCOUNTER — Encounter (HOSPITAL_COMMUNITY): Payer: Self-pay | Admitting: Urology

## 2019-01-12 ENCOUNTER — Ambulatory Visit: Payer: Medicare Other

## 2019-02-11 ENCOUNTER — Other Ambulatory Visit: Payer: Self-pay | Admitting: Cardiology

## 2019-02-13 ENCOUNTER — Ambulatory Visit: Payer: Medicare Other

## 2019-03-03 ENCOUNTER — Telehealth: Payer: Self-pay

## 2019-03-03 NOTE — Telephone Encounter (Signed)

## 2019-03-08 DIAGNOSIS — I712 Thoracic aortic aneurysm, without rupture: Secondary | ICD-10-CM | POA: Insufficient documentation

## 2019-03-08 DIAGNOSIS — I7121 Aneurysm of the ascending aorta, without rupture: Secondary | ICD-10-CM | POA: Insufficient documentation

## 2019-03-08 NOTE — Progress Notes (Signed)
Virtual Visit via Telephone Note   This visit type was conducted due to national recommendations for restrictions regarding the COVID-19 Pandemic (e.g. social distancing) in an effort to limit this patient's exposure and mitigate transmission in our community.  Due to her co-morbid illnesses, this patient is at least at moderate risk for complications without adequate follow up.  This format is felt to be most appropriate for this patient at this time.  All issues noted in this document were discussed and addressed.  A limited physical exam was performed with this format.  Please refer to the patient's chart for her consent to telehealth for [name].  Evaluation Performed:  Follow-up visit  This visit type was conducted due to national recommendations for restrictions regarding the COVID-19 Pandemic (e.g. social distancing).  This format is felt to be most appropriate for this patient at this time.  All issues noted in this document were discussed and addressed.  No physical exam was performed (except for noted visual exam findings with Video Visits).  Please refer to the patient's chart (MyChart message for video visits and phone note for telephone visits) for the patient's consent to telehealth for [facility]  Date:  03/09/2019   ID:  Brittany Robertson, DOB 1941-02-23, MRN [id number]  Patient Location:  Home  Provider location:   [place]  PCP:  Mayra Neer, MD  Cardiologist:  Fransico Him, MD Electrophysiologist:  None   Chief Complaint:  CAD, HTN and lipids  History of Present Illness:    Brittany Robertson is a 78 y.o. female who presents via audio/video conferencing for a telehealth visit today.    [name] is a 78 y.o. female with a hx of a ASCAD s/p PCI the LAD in 2006 with repeat cardiac cath in 2014 showing 30% mid LAD distal to the stent and otherwise normal coronary arteries and repeat heart cath 08/2016 showed normal coronary arteries with patent LAD  stent and normal LVEDP.  Her shortness of breath is felt to be due to obesity and deconditioning..  She has a history of hypertension, obesity, chronic diastolic CHF and hyperlipidemia.  He also has a history of mild aortic aneurysm with the ascending aorta measuring 4.2 cm.  She is here today for followup and is doing well.  She denies any chest pain or pressure, SOB, DOE (except with bending over pulling weeds), PND, orthopnea, LE edema, dizziness, palpitations or syncope. She is compliant with her meds and is tolerating meds with no SE.    The patient does not have symptoms concerning for COVID-19 infection (fever, chills, cough, or new shortness of breath).    Prior CV studies:   The following studies were reviewed today:  none  Past Medical History:  Diagnosis Date  . Ascending aortic aneurysm ([place])    109mm 07/2018  . Bruises easily   . Chronic diastolic CHF (congestive heart failure) ([place])   . Complication of anesthesia    "takes a long time to wake up"bp little low when wakes up  . Coronary artery disease    PCI 2006, repeat cath 30% mid LAD distal to stent otherwise normal coronary arteries, cath 08/2013 with patent LAD stent and otherwise normal coronary arteries with diastolic dysfunction. Cath 08/2016 with normal RHC, normal LVEDP, patent LAD stent with otherwise normal coronary arteries  . DDD (degenerative disc disease), cervical   . Dilated aortic root ([place])    36mm by echo 07/2017  . GERD (gastroesophageal reflux disease)   .  History of kidney stones   . Hyperlipidemia   . Hypertension   . Left ureteral stone   . OA (osteoarthritis)   . Obesity   . Osteopenia   . PONV (postoperative nausea and vomiting)    Motion sickness when move out of PACU to room  . PVC's (premature ventricular contractions)   . SOB (shortness of breath) 07/13/2016   non cardiac and secondary to deconditioning and obesity   Past Surgical History:  Procedure Laterality Date  . ABDOMINAL  HYSTERECTOMY    . APPENDECTOMY  1957  . arthroscopic knee  05/2002,04/07/2005   right and then also torn muscle surgery on right knee (2003(  . arthroscopic knee  01/02/2005,09/21/2006   left  . CARDIAC CATHETERIZATION N/A 08/31/2016   Procedure: Right/Left Heart Cath and Coronary Angiography;  Surgeon: Sherren Mocha, MD;  Location: [place] [facility];  Service: Cardiovascular;  Laterality: N/A;  . CHOLECYSTECTOMY  1988  . COLONOSCOPY  2010   with Endo  . CORONARY ANGIOPLASTY  02/05/2004  . CYST REMOVAL HAND  2000  . CYSTOSCOPY/URETEROSCOPY/HOLMIUM LASER/STENT PLACEMENT Left 08/26/2018   Procedure: CYSTOSCOPY/RETROGRADE/URETEROSCOPY/HOLMIUM LASER/ STONE BASKETRY/ STENT PLACEMENT;  Surgeon: Ceasar Mons, MD;  Location: [facility];  Service: Urology;  Laterality: Left;  ONLY NEEDS  45 MIN  . [place] OF UTERUS  1972  . EXTRACORPOREAL SHOCK WAVE LITHOTRIPSY Right 12/12/2018   Procedure: EXTRACORPOREAL SHOCK WAVE LITHOTRIPSY (ESWL);  Surgeon: Ceasar Mons, MD;  Location: [facility];  Service: Urology;  Laterality: Right;  . EYE SURGERY  01/2007   bilateral cataract with lens implant  . FOOT SURGERY  01/2012   left and right bunion and hammer toe  . HAND SURGERY  05/29/2011   joint removed  . [place]  . HERNIA REPAIR  02/2010   incisional hernioa  . KNEE ARTHROSCOPY    . LEFT HEART CATHETERIZATION WITH CORONARY ANGIOGRAM N/A 09/19/2013   Procedure: LEFT HEART CATHETERIZATION WITH CORONARY ANGIOGRAM;  Surgeon: Sueanne Margarita, MD;  Location: [place] [facility];  Service: Cardiovascular;  Laterality: N/A;  . neck injections  03/2014   back and neck injections from Dr. Nelva Bush  . stents     x1  . tear duct surgery  1990's   bilateral  . TONSILLECTOMY     as child  . TOTAL KNEE ARTHROPLASTY Left 01/04/2015   Procedure: LEFT TOTAL KNEE ARTHROPLASTY;  Surgeon: Sydnee Cabal, MD;  Location: [facility];  Service: Orthopedics;  Laterality:  Left;  . TOTAL KNEE ARTHROPLASTY Right 05/10/2015   Procedure: TOTAL RIGHT KNEE ARTHROPLASTY;  Surgeon: Sydnee Cabal, MD;  Location: [facility];  Service: Orthopedics;  Laterality: Right;     Current Meds  Medication Sig  . acetaminophen (TYLENOL) 500 MG tablet Take 1,000 mg by mouth daily as needed for headache.   Marland Kitchen aspirin 81 MG tablet Take 81 mg by mouth every evening.   . cetirizine (ZYRTEC) 10 MG tablet Take 10 mg by mouth every morning.    . cholecalciferol (VITAMIN D) 400 UNITS TABS Take 400 Units by mouth every other day.   . Coenzyme Q10 Liposomal 100 MG/ML LIQD Take 100 mg by mouth every evening.  . denosumab (PROLIA) 60 MG/ML SOLN injection Inject 60 mg into the skin every 6 (six) months. Administer in upper arm, thigh, or abdomen  . docusate sodium (COLACE) 100 MG capsule Take 100 mg by mouth 2 (two) times daily.   Marland Kitchen ezetimibe (ZETIA) 10 MG tablet  TAKE 1 TABLET BY MOUTH  EVERY EVENING.  . fish oil-omega-3 fatty acids 1000 MG capsule Take 1 g by mouth 2 (two) times daily.    . furosemide (LASIX) 20 MG tablet TAKE 1 TABLET BY MOUTH  DAILY AS NEEDED FOR FLUID  OR EDEMA.  . Glucosamine-Chondroit-Vit C-Mn (GLUCOSAMINE 1500 COMPLEX PO) Take 1,500 mg by mouth 2 (two) times daily.  . meloxicam (MOBIC) 15 MG tablet Take 15 mg by mouth every evening.    . metoprolol succinate (TOPROL-XL) 25 MG 24 hr tablet TAKE ONE-HALF TABLET BY  MOUTH DAILY  . mirabegron ER (MYRBETRIQ) 50 MG TB24 tablet Take 50 mg by mouth every evening.   . nitroGLYCERIN (NITROSTAT) 0.4 MG SL tablet Place 1 tablet (0.4 mg total) under the tongue every 5 (five) minutes as needed. As needed for chest pain x 3 doses  . olmesartan (BENICAR) 40 MG tablet TAKE 1 TABLET BY MOUTH  DAILY  . omeprazole (PRILOSEC OTC) 20 MG tablet Take 20 mg by mouth daily.  Vladimir Faster Glycol-Propyl Glycol (SYSTANE ULTRA OP) Apply 1 drop to eye 2 (two) times daily as needed (dry eyes).  . potassium chloride SA (K-DUR,KLOR-CON) 20 MEQ tablet Take 1  tablet by mouth  every morning  . vitamin C (ASCORBIC ACID) 500 MG tablet Take 500 mg by mouth 2 (two) times daily.   . vitamin E 400 UNIT capsule Take 400 Units by mouth at bedtime.      Allergies:   Atorvastatin; Codeine; Sulfa antibiotics; Sulfamethoxazole; Sulfasalazine; Adhesive [tape]; Other; and Penicillins   Social History   Tobacco Use  . Smoking status: Never Smoker  . Smokeless tobacco: Never Used  Substance Use Topics  . Alcohol use: Yes    Alcohol/week: 1.0 standard drinks    Types: 1 Glasses of wine per week    Comment: occassionally  . Drug use: No     Family Hx: The patient's family history includes Cancer in her mother; Heart Problems in her father; Hypertension in her father and mother.  ROS:   Please see the history of present illness.     All other systems reviewed and are negative.   Labs/Other Tests and Data Reviewed:    Recent Labs: 08/22/2018: ALT 17; BUN 21; Creatinine, Ser 0.91; Hemoglobin 13.6; Platelets 191; Potassium 4.6; Sodium 141   Recent Lipid Panel Lab Results  Component Value Date/Time   CHOL 124 01/11/2017 08:41 AM   TRIG 127 01/11/2017 08:41 AM   HDL 49 01/11/2017 08:41 AM   CHOLHDL 2.5 01/11/2017 08:41 AM   CHOLHDL 2.1 04/24/2016 08:50 AM   LDLCALC 50 01/11/2017 08:41 AM    Wt Readings from Last 3 Encounters:  03/09/19 231 lb (104.8 kg)  12/12/18 222 lb 12.8 oz (101.1 kg)  08/26/18 231 lb 4.8 oz (104.9 kg)     Objective:    Vital Signs:  BP 132/75   Ht 5\' 4"  (1.626 m)   Wt 231 lb (104.8 kg)   BMI 39.65 kg/m    CONSTITUTIONAL:  Well nourished, well developed female in no acute distress.  EYES: anicteric MOUTH: oral mucosa is pink RESPIRATORY: Normal respiratory effort, symmetric expansion CARDIOVASCULAR: No peripheral edema SKIN: No rash, lesions or ulcers MUSCULOSKELETAL: no digital cyanosis NEURO: Cranial Nerves II-XII grossly intact, moves all extremities PSYCH: Intact judgement and insight.  A&O x 3,  Mood/affect appropriate   ASSESSMENT & PLAN:    1.  ASCAD - s/p PCI the LAD in 2006 with repeat cardiac cath in  2014 showing 30% mid LAD distal to the stent and otherwise normal coronary arteries and repeat heart cath 08/2016 showed normal coronary arteries with patent LAD stent and normal LVEDP.  She has chronic DOE which has been felt to be due to obesity and deconditioning.  She will continue on BB, statin and  ASA 81mg  daily.    2.  Chronic diastolic CHF - She has chronic DOE which is stable and no LE edema.  She will continue on Lasix 20mg  daily PRN.  SHe has not needed any diuretics recently.    3.  Hypertension - her BP is controlled on exam today.  She will continue on Toprol XL 12.5mg  daily and Olmesartan 40mg  daily.  Her last BMET was 0.84 09/2018.  4.  Hyperlipidemia - her LDL goal is < 70. She will continue on Zetia 10mg  daily and Crestor 20mg  daily.  Her last LDL was 32 in Dec 2019..     5.  Dilated ascending aorta - Chest CT 07/2018 showed a 4.2cm ascending aortic aneurysm.  I will repeat Chest CT 07/2019.  Her BP is controlled.  She will continue on Zetia and is statin intolerant.    6.  COVID-19 Education:The signs and symptoms of COVID-19 were discussed with the patient and how to seek care for testing (follow up with PCP or arrange E-visit).  The importance of social distancing was discussed today.  Patient Risk:   After full review of this patient's clinical status, I feel that they are at least moderate risk at this time.  Time:   Today, I have spent 12 minutes directly with the patient on video discussing medical problems including CAD, HTN, lipids, CHF.  We also reviewed the symptoms of COVID 19 and the ways to protect against contracting the virus with telehealth technology.  I spent an additional 5 minutes reviewing patient's chart including 2D echo and chest CT.  Medication Adjustments/Labs and Tests Ordered: Current medicines are reviewed at length with the patient  today.  Concerns regarding medicines are outlined above.  Tests Ordered: No orders of the defined types were placed in this encounter.  Medication Changes: No orders of the defined types were placed in this encounter.   Disposition:  Follow up in 1 year(s)  Signed, Fransico Him, MD  03/09/2019 8:13 AM    [facility]

## 2019-03-09 ENCOUNTER — Other Ambulatory Visit: Payer: Self-pay

## 2019-03-09 ENCOUNTER — Telehealth (INDEPENDENT_AMBULATORY_CARE_PROVIDER_SITE_OTHER): Payer: Medicare Other | Admitting: Cardiology

## 2019-03-09 ENCOUNTER — Encounter: Payer: Self-pay | Admitting: Cardiology

## 2019-03-09 VITALS — BP 132/75 | Ht 64.0 in | Wt 231.0 lb

## 2019-03-09 DIAGNOSIS — I5032 Chronic diastolic (congestive) heart failure: Secondary | ICD-10-CM

## 2019-03-09 DIAGNOSIS — I712 Thoracic aortic aneurysm, without rupture, unspecified: Secondary | ICD-10-CM

## 2019-03-09 DIAGNOSIS — I7121 Aneurysm of the ascending aorta, without rupture: Secondary | ICD-10-CM

## 2019-03-09 DIAGNOSIS — Z7189 Other specified counseling: Secondary | ICD-10-CM

## 2019-03-09 DIAGNOSIS — I2510 Atherosclerotic heart disease of native coronary artery without angina pectoris: Secondary | ICD-10-CM | POA: Diagnosis not present

## 2019-03-09 DIAGNOSIS — E785 Hyperlipidemia, unspecified: Secondary | ICD-10-CM | POA: Diagnosis not present

## 2019-03-09 DIAGNOSIS — I10 Essential (primary) hypertension: Secondary | ICD-10-CM

## 2019-03-09 NOTE — Patient Instructions (Signed)
Medication Instructions:  Your provider recommends that you continue on your current medications as directed. Please refer to the Current Medication list given to you today.    Labwork: You will have fasting lab work prior to your CT scan in September.  Testing/Procedures: Dr. Radford Pax has ordered for you a CT scan. You will be called to arrange this for September.   Follow-Up: Your provider wants you to follow-up in: 1 year. You will receive a reminder letter in the mail two months in advance. If you don't receive a letter, please call our office to schedule the follow-up appointment.    Any Other Special Instructions Will Be Listed Below (If Applicable).     If you need a refill on your cardiac medications before your next appointment, please call your pharmacy.

## 2019-03-29 ENCOUNTER — Other Ambulatory Visit: Payer: Self-pay

## 2019-03-29 ENCOUNTER — Ambulatory Visit
Admission: RE | Admit: 2019-03-29 | Discharge: 2019-03-29 | Disposition: A | Discharge status: Home / Self Care | Payer: Medicare Other | Source: Ambulatory Visit | Attending: Family Medicine | Admitting: Family Medicine

## 2019-03-29 DIAGNOSIS — Z1231 Encounter for screening mammogram for malignant neoplasm of breast: Secondary | ICD-10-CM

## 2019-04-15 ENCOUNTER — Other Ambulatory Visit: Payer: Self-pay | Admitting: Cardiology

## 2019-04-26 DIAGNOSIS — M5416 Radiculopathy, lumbar region: Secondary | ICD-10-CM | POA: Insufficient documentation

## 2019-07-05 ENCOUNTER — Other Ambulatory Visit: Payer: Medicare Other | Admitting: *Deleted

## 2019-07-05 ENCOUNTER — Other Ambulatory Visit: Payer: Self-pay

## 2019-07-05 DIAGNOSIS — I2510 Atherosclerotic heart disease of native coronary artery without angina pectoris: Secondary | ICD-10-CM

## 2019-07-05 LAB — BASIC METABOLIC PANEL
BUN/Creatinine Ratio: 25 (ref 12–28)
BUN: 22 mg/dL (ref 8–27)
CO2: 24 mmol/L (ref 20–29)
Calcium: 9.7 mg/dL (ref 8.7–10.3)
Chloride: 105 mmol/L (ref 96–106)
Creatinine, Ser: 0.88 mg/dL (ref 0.57–1.00)
GFR calc Af Amer: 72 mL/min/{1.73_m2} (ref >59)
GFR calc non Af Amer: 62 mL/min/{1.73_m2} (ref >59)
Glucose: 107 mg/dL — ABNORMAL HIGH (ref 65–99)
Potassium: 4.4 mmol/L (ref 3.5–5.2)
Sodium: 142 mmol/L (ref 134–144)

## 2019-07-05 LAB — LIPID PANEL
Chol/HDL Ratio: 2.1 ratio (ref 0.0–4.4)
Cholesterol, Total: 102 mg/dL (ref 100–199)
HDL: 48 mg/dL (ref >39)
LDL Chol Calc (NIH): 35 mg/dL (ref 0–99)
Triglycerides: 103 mg/dL (ref 0–149)
VLDL Cholesterol Cal: 19 mg/dL (ref 5–40)

## 2019-07-05 LAB — ALT: ALT: 15 IU/L (ref 0–32)

## 2019-07-12 ENCOUNTER — Telehealth: Payer: Self-pay

## 2019-07-12 ENCOUNTER — Ambulatory Visit (INDEPENDENT_AMBULATORY_CARE_PROVIDER_SITE_OTHER)
Admission: RE | Admit: 2019-07-12 | Discharge: 2019-07-12 | Disposition: A | Discharge status: Home / Self Care | Payer: Medicare Other | Source: Ambulatory Visit | Attending: Cardiology | Admitting: Cardiology

## 2019-07-12 ENCOUNTER — Other Ambulatory Visit: Payer: Self-pay

## 2019-07-12 DIAGNOSIS — I712 Thoracic aortic aneurysm, without rupture, unspecified: Secondary | ICD-10-CM

## 2019-07-12 MED ORDER — IOHEXOL 350 MG/ML SOLN
100.0000 mL | Freq: Once | INTRAVENOUS | Status: AC | PRN
  Administered 2019-07-12: 100 mL via INTRAVENOUS

## 2019-07-12 NOTE — Telephone Encounter (Signed)
The patient has been notified of the result and verbalized understanding.  All questions (if any) were answered. Wilma Flavin, RN 07/12/2019 2:45 PM

## 2019-11-12 ENCOUNTER — Other Ambulatory Visit: Payer: Self-pay | Admitting: Cardiology

## 2019-11-13 ENCOUNTER — Other Ambulatory Visit: Payer: Self-pay | Admitting: Family Medicine

## 2019-11-13 DIAGNOSIS — M8580 Other specified disorders of bone density and structure, unspecified site: Secondary | ICD-10-CM

## 2019-12-28 ENCOUNTER — Emergency Department (HOSPITAL_COMMUNITY): Payer: Medicare Other

## 2019-12-28 ENCOUNTER — Emergency Department (HOSPITAL_COMMUNITY)
Admission: EM | Admit: 2019-12-28 | Discharge: 2019-12-28 | Disposition: A | Discharge status: Home / Self Care | Payer: Medicare Other | Attending: Emergency Medicine | Admitting: Emergency Medicine

## 2019-12-28 DIAGNOSIS — Z96653 Presence of artificial knee joint, bilateral: Secondary | ICD-10-CM | POA: Diagnosis not present

## 2019-12-28 DIAGNOSIS — I110 Hypertensive heart disease with heart failure: Secondary | ICD-10-CM | POA: Insufficient documentation

## 2019-12-28 DIAGNOSIS — Z955 Presence of coronary angioplasty implant and graft: Secondary | ICD-10-CM | POA: Insufficient documentation

## 2019-12-28 DIAGNOSIS — Z9049 Acquired absence of other specified parts of digestive tract: Secondary | ICD-10-CM | POA: Insufficient documentation

## 2019-12-28 DIAGNOSIS — Z7982 Long term (current) use of aspirin: Secondary | ICD-10-CM | POA: Diagnosis not present

## 2019-12-28 DIAGNOSIS — I5032 Chronic diastolic (congestive) heart failure: Secondary | ICD-10-CM | POA: Diagnosis not present

## 2019-12-28 DIAGNOSIS — R0789 Other chest pain: Secondary | ICD-10-CM | POA: Diagnosis not present

## 2019-12-28 DIAGNOSIS — I2510 Atherosclerotic heart disease of native coronary artery without angina pectoris: Secondary | ICD-10-CM | POA: Insufficient documentation

## 2019-12-28 DIAGNOSIS — Z79899 Other long term (current) drug therapy: Secondary | ICD-10-CM | POA: Diagnosis not present

## 2019-12-28 DIAGNOSIS — R1013 Epigastric pain: Secondary | ICD-10-CM | POA: Diagnosis present

## 2019-12-28 LAB — COMPREHENSIVE METABOLIC PANEL
ALT: 81 U/L — ABNORMAL HIGH (ref 0–44)
AST: 226 U/L — ABNORMAL HIGH (ref 15–41)
Albumin: 3.4 g/dL — ABNORMAL LOW (ref 3.5–5.0)
Alkaline Phosphatase: 77 U/L (ref 38–126)
Anion gap: 12 (ref 5–15)
BUN: 23 mg/dL (ref 8–23)
CO2: 20 mmol/L — ABNORMAL LOW (ref 22–32)
Calcium: 8.8 mg/dL — ABNORMAL LOW (ref 8.9–10.3)
Chloride: 109 mmol/L (ref 98–111)
Creatinine, Ser: 0.91 mg/dL (ref 0.44–1.00)
GFR calc Af Amer: >60 mL/min (ref >60)
GFR calc non Af Amer: 60 mL/min — ABNORMAL LOW (ref >60)
Glucose, Bld: 146 mg/dL — ABNORMAL HIGH (ref 70–99)
Potassium: 4.1 mmol/L (ref 3.5–5.1)
Sodium: 141 mmol/L (ref 135–145)
Total Bilirubin: 1.1 mg/dL (ref 0.3–1.2)
Total Protein: 5.8 g/dL — ABNORMAL LOW (ref 6.5–8.1)

## 2019-12-28 LAB — CBC
HCT: 39.2 % (ref 36.0–46.0)
Hemoglobin: 12.6 g/dL (ref 12.0–15.0)
MCH: 32.1 pg (ref 26.0–34.0)
MCHC: 32.1 g/dL (ref 30.0–36.0)
MCV: 100 fL (ref 80.0–100.0)
Platelets: UNDETERMINED 10*3/uL (ref 150–400)
RBC: 3.92 MIL/uL (ref 3.87–5.11)
RDW: 13.4 % (ref 11.5–15.5)
WBC: 7.2 10*3/uL (ref 4.0–10.5)
nRBC: 0 % (ref 0.0–0.2)

## 2019-12-28 LAB — I-STAT CHEM 8, ED
BUN: 25 mg/dL — ABNORMAL HIGH (ref 8–23)
Calcium, Ion: 1.1 mmol/L — ABNORMAL LOW (ref 1.15–1.40)
Chloride: 108 mmol/L (ref 98–111)
Creatinine, Ser: 0.8 mg/dL (ref 0.44–1.00)
Glucose, Bld: 137 mg/dL — ABNORMAL HIGH (ref 70–99)
HCT: 37 % (ref 36.0–46.0)
Hemoglobin: 12.6 g/dL (ref 12.0–15.0)
Potassium: 4 mmol/L (ref 3.5–5.1)
Sodium: 140 mmol/L (ref 135–145)
TCO2: 23 mmol/L (ref 22–32)

## 2019-12-28 LAB — LIPASE, BLOOD: Lipase: 23 U/L (ref 11–51)

## 2019-12-28 LAB — CBG MONITORING, ED: Glucose-Capillary: 126 mg/dL — ABNORMAL HIGH (ref 70–99)

## 2019-12-28 LAB — TROPONIN I (HIGH SENSITIVITY)
Troponin I (High Sensitivity): 5 ng/L (ref <18)
Troponin I (High Sensitivity): 5 ng/L (ref <18)

## 2019-12-28 MED ORDER — NITROGLYCERIN 0.4 MG SL SUBL
0.4000 mg | SUBLINGUAL_TABLET | SUBLINGUAL | Status: DC | PRN
  Administered 2019-12-28 (×2): 0.4 mg via SUBLINGUAL
  Filled 2019-12-28: qty 1

## 2019-12-28 MED ORDER — ASPIRIN 81 MG PO CHEW: 324.0000 mg | CHEWABLE_TABLET | Freq: Once | ORAL | Status: DC

## 2019-12-28 MED ORDER — MORPHINE SULFATE (PF) 4 MG/ML IV SOLN
4.0000 mg | Freq: Once | INTRAVENOUS | Status: AC
  Administered 2019-12-28: 4 mg via INTRAVENOUS
  Filled 2019-12-28: qty 1

## 2019-12-28 MED ORDER — IOHEXOL 350 MG/ML SOLN
100.0000 mL | Freq: Once | INTRAVENOUS | Status: AC | PRN
  Administered 2019-12-28: 100 mL via INTRAVENOUS

## 2019-12-28 MED ORDER — NITROGLYCERIN 0.4 MG SL SUBL: 0.4000 mg | SUBLINGUAL_TABLET | SUBLINGUAL | 0 refills | Status: DC | PRN

## 2019-12-28 MED ORDER — ONDANSETRON 4 MG PO TBDP
4.0000 mg | ORAL_TABLET | Freq: Once | ORAL | Status: AC
  Administered 2019-12-28: 4 mg via ORAL
  Filled 2019-12-28: qty 1

## 2019-12-28 MED ORDER — ONDANSETRON HCL 4 MG PO TABS: 4.0000 mg | ORAL_TABLET | Freq: Every day | ORAL | 0 refills | Status: DC | PRN

## 2019-12-28 NOTE — ED Notes (Signed)
Husband would like periodic updates on pt status and plan of care (waiting outside-preference)

## 2019-12-28 NOTE — ED Provider Notes (Addendum)
[facility] EMERGENCY DEPARTMENT Provider Note   CSN: [id number] Arrival date & time: 12/28/19  1333   History Chief Complaint  Patient presents with  . Abdominal Pain   Brittany Robertson is a 79 y.o. female with PMH of CAD s/p stent in LAD in 2006, HFpEF, GERD, HLD, HTN, osteopenia here with epigastric and chest pain. She reports sudden onset of epigastric and chest pain about 1 hour ago when she was standing in her kitchen attempting to open a box. She received ASA 384mg , x4 nitro, 4mg  zofran, and 4mg  morphine prior to arrival to the ED and patient reports no resolution of her symptoms. She reports never experiencing pain like this before. She denies any shortness of breath. She reports her pain is not radiating anywhere else. She denies any dizziness, lightheadedness, or headache.  She reports she has a stent placed in 2006 and Dr. Radford Pax is her cardiologist.   Past Medical History:  Diagnosis Date  . Ascending aortic aneurysm ([place])    42mm 07/2018  . Bruises easily   . Chronic diastolic CHF (congestive heart failure) ([place])   . Complication of anesthesia    "takes a long time to wake up"bp little low when wakes up  . Coronary artery disease    PCI 2006, repeat cath 30% mid LAD distal to stent otherwise normal coronary arteries, cath 08/2013 with patent LAD stent and otherwise normal coronary arteries with diastolic dysfunction. Cath 08/2016 with normal RHC, normal LVEDP, patent LAD stent with otherwise normal coronary arteries  . DDD (degenerative disc disease), cervical   . Dilated aortic root ([place])    32mm by echo 07/2017  . GERD (gastroesophageal reflux disease)   . History of kidney stones   . Hyperlipidemia   . Hypertension   . Left ureteral stone   . OA (osteoarthritis)   . Obesity   . Osteopenia   . PONV (postoperative nausea and vomiting)    Motion sickness when move out of PACU to room  . PVC's (premature ventricular contractions)   . SOB (shortness  of breath) 07/13/2016   non cardiac and secondary to deconditioning and obesity   Patient Active Problem List   Diagnosis Date Noted  . Ascending aortic aneurysm ([place]) 03/08/2019  . Hypersomnia 10/30/2016  . Exertional dyspnea 10/30/2016  . Nodule of right lung 08/10/2016  . History of snoring 08/10/2016  . Sensation of lump in throat 08/10/2016  . Other fatigue 08/10/2016  . SOB (shortness of breath) 07/13/2016  . Chest crackles 06/29/2016  . Abnormal PFT 06/29/2016  . Osteoarthritis of right knee 05/10/2015  . S/P total knee replacement 05/10/2015  . S/P knee replacement 05/10/2015  . Primary osteoarthritis of left knee 01/04/2015  . S/P total knee arthroplasty 01/04/2015  . Chronic diastolic CHF (congestive heart failure) ([place])   . CAD (coronary artery disease) 09/19/2013  . Dyspnea and respiratory abnormality 08/15/2013  . Dyslipidemia, goal LDL below 70 08/15/2013  . PVC's (premature ventricular contractions)   . Fall 09/28/2011  . Multiple fractures of ribs of left side 09/28/2011  . Bilateral contusions of knee 09/28/2011  . Obesity 09/28/2011  . Hypertension   . Degenerative disk disease    Past Surgical History:  Procedure Laterality Date  . ABDOMINAL HYSTERECTOMY    . APPENDECTOMY  1957  . arthroscopic knee  05/2002,04/07/2005   right and then also torn muscle surgery on right knee (2003(  . arthroscopic knee  01/02/2005,09/21/2006   left  . CARDIAC  CATHETERIZATION N/A 08/31/2016   Procedure: Right/Left Heart Cath and Coronary Angiography;  Surgeon: Sherren Mocha, MD;  Location: [facility];  Service: Cardiovascular;  Laterality: N/A;  . CHOLECYSTECTOMY  1988  . COLONOSCOPY  2010   with Endo  . CORONARY ANGIOPLASTY  02/05/2004  . CYST REMOVAL HAND  2000  . CYSTOSCOPY/URETEROSCOPY/HOLMIUM LASER/STENT PLACEMENT Left 08/26/2018   Procedure: CYSTOSCOPY/RETROGRADE/URETEROSCOPY/HOLMIUM LASER/ STONE BASKETRY/ STENT PLACEMENT;  Surgeon: Ceasar Mons, MD;  Location: [facility];  Service: Urology;  Laterality: Left;  ONLY NEEDS  45 MIN  . [place] OF UTERUS  1972  . EXTRACORPOREAL SHOCK WAVE LITHOTRIPSY Right 12/12/2018   Procedure: EXTRACORPOREAL SHOCK WAVE LITHOTRIPSY (ESWL);  Surgeon: Ceasar Mons, MD;  Location: [facility];  Service: Urology;  Laterality: Right;  . EYE SURGERY  01/2007   bilateral cataract with lens implant  . FOOT SURGERY  01/2012   left and right bunion and hammer toe  . HAND SURGERY  05/29/2011   joint removed  . [place]  . HERNIA REPAIR  02/2010   incisional hernioa  . KNEE ARTHROSCOPY    . LEFT HEART CATHETERIZATION WITH CORONARY ANGIOGRAM N/A 09/19/2013   Procedure: LEFT HEART CATHETERIZATION WITH CORONARY ANGIOGRAM;  Surgeon: Sueanne Margarita, MD;  Location: [facility];  Service: Cardiovascular;  Laterality: N/A;  . neck injections  03/2014   back and neck injections from Dr. Nelva Bush  . stents     x1  . tear duct surgery  1990's   bilateral  . TONSILLECTOMY     as child  . TOTAL KNEE ARTHROPLASTY Left 01/04/2015   Procedure: LEFT TOTAL KNEE ARTHROPLASTY;  Surgeon: Sydnee Cabal, MD;  Location: [facility];  Service: Orthopedics;  Laterality: Left;  . TOTAL KNEE ARTHROPLASTY Right 05/10/2015   Procedure: TOTAL RIGHT KNEE ARTHROPLASTY;  Surgeon: Sydnee Cabal, MD;  Location: [facility];  Service: Orthopedics;  Laterality: Right;    OB History   No obstetric history on file.     Family History  Problem Relation Age of Onset  . Hypertension Mother   . Cancer Mother   . Hypertension Father   . Heart Problems Father     Social History   Tobacco Use  . Smoking status: Never Smoker  . Smokeless tobacco: Never Used  Substance Use Topics  . Alcohol use: Yes    Alcohol/week: 1.0 standard drinks    Types: 1 Glasses of wine per week    Comment: occassionally  . Drug use: No    Home Medications Prior to Admission medications   Medication Sig  Start Date End Date Taking? Authorizing Provider  acetaminophen (TYLENOL) 500 MG tablet Take 1,000 mg by mouth daily as needed for headache.    Yes [provider]  aspirin 81 MG tablet Take 81 mg by mouth every evening.    Yes [provider]  cetirizine (ZYRTEC) 10 MG tablet Take 10 mg by mouth every morning.     Yes [provider]  cholecalciferol (VITAMIN D) 400 UNITS TABS Take 400 Units by mouth every other day.    Yes [provider]  Coenzyme Q10 Liposomal 100 MG/ML LIQD Take 100 mg by mouth every evening.   Yes [provider]  denosumab (PROLIA) 60 MG/ML SOLN injection Inject 60 mg into the skin every 6 (six) months. Administer in upper arm, thigh, or abdomen   Yes [provider]  docusate sodium (COLACE) 100 MG capsule Take  100 mg by mouth 2 (two) times daily.    Yes [provider]  ezetimibe (ZETIA) 10 MG tablet TAKE 1 TABLET BY MOUTH IN  THE EVENING 11/13/19  Yes Turner, Eber Hong, MD  fish oil-omega-3 fatty acids 1000 MG capsule Take 1 g by mouth 2 (two) times daily.     Yes [provider]  Glucosamine-Chondroit-Vit C-Mn (GLUCOSAMINE 1500 COMPLEX PO) Take 1,500 mg by mouth 2 (two) times daily.   Yes [provider]  meloxicam (MOBIC) 15 MG tablet Take 15 mg by mouth every evening.     Yes [provider]  metoprolol succinate (TOPROL-XL) 25 MG 24 hr tablet TAKE ONE-HALF TABLET BY  MOUTH DAILY Patient taking differently: Take 12.5 mg by mouth daily.  11/13/19  Yes Turner, Eber Hong, MD  mirabegron ER (MYRBETRIQ) 50 MG TB24 tablet Take 50 mg by mouth every evening.    Yes [provider]  nitroGLYCERIN (NITROSTAT) 0.4 MG SL tablet Place 1 tablet (0.4 mg total) under the tongue every 5 (five) minutes as needed. As needed for chest pain x 3 doses 12/07/14  Yes Turner, Traci R, MD  olmesartan (BENICAR) 40 MG tablet TAKE 1 TABLET BY MOUTH  DAILY Patient taking differently: Take 40 mg by mouth  daily.  11/13/19  Yes Turner, Eber Hong, MD  omeprazole (PRILOSEC OTC) 20 MG tablet Take 20 mg by mouth daily.   Yes [provider]  Polyethyl Glycol-Propyl Glycol (SYSTANE ULTRA OP) Apply 1 drop to eye 2 (two) times daily as needed (dry eyes).   Yes [provider]  potassium chloride SA (K-DUR,KLOR-CON) 20 MEQ tablet Take 1 tablet by mouth  every morning 12/04/15  Yes Turner, Traci R, MD  rosuvastatin (CRESTOR) 20 MG tablet Take 20 mg by mouth daily. 01/08/19  Yes [provider]  vitamin C (ASCORBIC ACID) 500 MG tablet Take 500 mg by mouth 2 (two) times daily.    Yes [provider]  vitamin E 400 UNIT capsule Take 400 Units by mouth at bedtime.    Yes [provider]  furosemide (LASIX) 20 MG tablet TAKE 1 TABLET BY MOUTH  DAILY AS NEEDED FOR FLUID  OR EDEMA. Patient not taking: Reported on 12/28/2019 02/11/19   Sueanne Margarita, MD    Allergies    Atorvastatin, Codeine, Sulfa antibiotics, Sulfamethoxazole, Sulfasalazine, Adhesive [tape], Other, and Penicillins  Review of Systems   Review of Systems  Constitutional: Negative for chills and fever.  Eyes: Negative for visual disturbance.  Respiratory: Negative for chest tightness and shortness of breath.   Cardiovascular: Positive for chest pain. Negative for palpitations.  Gastrointestinal: Positive for abdominal pain and nausea. Negative for vomiting.  Neurological: Negative for dizziness and headaches.    Physical Exam Updated Vital Signs BP (!) 142/78   Pulse 71   Temp 97.8 F (36.6 C) (Oral)   Resp 12   SpO2 100%   Physical Exam Vitals and nursing note reviewed.  Constitutional:      General: She is in acute distress.     Appearance: She is well-developed. She is obese. She is not toxic-appearing or diaphoretic.  Cardiovascular:     Rate and Rhythm: Normal rate and regular rhythm.     Heart sounds: Normal heart sounds. No murmur.  Pulmonary:     Effort: Pulmonary effort is normal.      Breath sounds: Normal breath sounds. No wheezing or rhonchi.  Abdominal:     General: Abdomen is flat. Bowel  sounds are normal. There is no distension.     Palpations: Abdomen is soft.     Tenderness: There is abdominal tenderness in the epigastric area. There is no right CVA tenderness or left CVA tenderness.  Genitourinary:    Rectum: Normal.  Skin:    General: Skin is warm.     Capillary Refill: Capillary refill takes less than 2 seconds.  Neurological:     General: No focal deficit present.     Mental Status: She is alert.  Psychiatric:        Mood and Affect: Mood normal.        Behavior: Behavior normal.    ED Results / Procedures / Treatments   Labs (all labs ordered are listed, but only abnormal results are displayed) Labs Reviewed  COMPREHENSIVE METABOLIC PANEL - Abnormal; Notable for the following components:      Result Value   CO2 20 (*)    Glucose, Bld 146 (*)    Calcium 8.8 (*)    Total Protein 5.8 (*)    Albumin 3.4 (*)    AST 226 (*)    ALT 81 (*)    GFR calc non Af Amer 60 (*)    All other components within normal limits  CBG MONITORING, ED - Abnormal; Notable for the following components:   Glucose-Capillary 126 (*)    All other components within normal limits  I-STAT CHEM 8, ED - Abnormal; Notable for the following components:   BUN 25 (*)    Glucose, Bld 137 (*)    Calcium, Ion 1.10 (*)    All other components within normal limits  CBC  LIPASE, BLOOD  TROPONIN I (HIGH SENSITIVITY)  TROPONIN I (HIGH SENSITIVITY)    EKG EKG Interpretation  Date/Time:  Thursday December 28 2019 14:06:29 EST Ventricular Rate:  59 PR Interval:    QRS Duration: 98 QT Interval:  442 QTC Calculation: 438 R Axis:   22 Text Interpretation: Sinus rhythm Abnormal R-wave progression, early transition No significant change since last tracing Confirmed by Wandra Arthurs [phone]) on 12/28/2019 3:36:38 PM   Radiology DG Chest Portable 1 View  Result Date:  12/28/2019 CLINICAL DATA:  79 year old female with history of chest pain. EXAM: PORTABLE CHEST 1 VIEW COMPARISON:  Chest x-ray 05/25/2016. FINDINGS: 1.7 x 0.8 cm nodular density projecting over the right mid lung laterally. Lung volumes are low. No consolidative airspace disease. No pleural effusions. No pneumothorax. Pulmonary vasculature and the cardiomediastinal silhouette are within normal limits. Aortic atherosclerosis. IMPRESSION: 1. 1.2 x 0.8 cm nodule in the right mid lung, new compared to the prior examination. Follow-up standing PA and lateral chest radiograph is recommended in 1-2 months to ensure the stability or resolution of this finding. 2. Low lung volumes without radiographic evidence of acute cardiopulmonary disease. 3. Aortic atherosclerosis. Electronically Signed   By: Vinnie Langton M.D.   On: 12/28/2019 14:22   CT Angio Chest/Abd/Pel for Dissection W and/or Wo Contrast  Result Date: 12/28/2019 CLINICAL DATA:  Chest pain.  Suspected aortic dissection. EXAM: CT ANGIOGRAPHY CHEST, ABDOMEN AND PELVIS TECHNIQUE: Multidetector CT imaging through the chest, abdomen and pelvis was performed using the standard protocol during bolus administration of intravenous contrast. Multiplanar reconstructed images and MIPs were obtained and reviewed to evaluate the vascular anatomy. Automatic exposure control utilized. CONTRAST:  186mL OMNIPAQUE IOHEXOL 350 MG/ML SOLN COMPARISON:  CT a chest dated July 12, 2019. CT abdomen pelvis dated August 22, 2018. FINDINGS: CTA CHEST FINDINGS Cardiovascular: No  thoracoabdominal aortic dissection. A 3.8 by 4.2 cm fusiform dilatation of the tubular aorta. Mild try valvular aortic valvular calcification. Normal heart size with four-vessel coronary calcification, moderate to severe along the LAD over a long segment. Thoracoabdominal aorta calcified atherosclerosis. Pulmonary artery hypertension, the right pulmonary artery measuring 3.1 cm diameter. Mediastinum/Nodes:  Small hiatal hernia. Benign sized mediastinal lymph nodes. Lungs/Pleura: Minimal subpleural atelectasis. No dependent pleural effusion. Musculoskeletal: Diffuse moderate degenerative change with bulky spondylosis and grade 1 anterolisthesis of T2 on T3. Diffuse demineralization. Review of the MIP images confirms the above findings. CTA ABDOMEN AND PELVIS FINDINGS VASCULAR Aorta: No aneurysmal dilatation. Mild-to-moderate calcified atherosclerosis at the ostia of the major abdominal arteries without a severe or critical stenosis, dissection, or other intimal injury. Small peripherally calcified splenic artery aneurysm measuring 1.8 by 1.4 cm. Celiac: Mild ostial calcification.  Normal contrast filling. SMA: Minimal ostial calcification.  Normal contrast filling. Renals: Moderate bilateral ostial calcification. No evidence for dysplasia. IMA: Mild ostial calcification.  Normal contrast filling. Inflow: Moderate calcification at the right common iliac origin. More diffuse mild calcification elsewhere. Veins: Non opacification of the deep venous system on current contrast bolus timing. Left renal vein courses ventral to the abdominal aorta. Mild mass-effect of the right common iliac artery on the left common iliac vein. Review of the MIP images confirms the above findings. NON-VASCULAR Hepatobiliary: Cholecystectomy. Normal hepatic size and contours. Normal distal tapering of the common bile duct. Minimal periportal edema. Pancreas: Mild atrophy and fatty infiltration. No acute pancreatitis. Spleen: Normal. Adrenals/Urinary Tract: Normal adrenal glands. Mild bilateral perinephric inflammatory change with bilateral nonobstructing micro nephrolithiasis and parapelvic cysts. Normal ureters. Normal urinary bladder. Stomach/Bowel: No obstruction. Large stool burden. No apparent wall thickening. Surgically absent or decompressed appendix. Lymphatic: No adenopathy. Reproductive: Hysterectomy.  No adnexal cyst or mass. Other:  Small periumbilical herniation of fat without strangulation or incarceration. Musculoskeletal: Diffuse thoracolumbar moderate degenerative change and more pronounced disc space obliteration at L2-3. Mild physiologic wedging of T11 and T12. Healed deformity of the left iliac crest, possibly secondary to bone marrow sampling. Review of the MIP images confirms the above findings. IMPRESSION: No dissection or other intimal injury of the thoracoabdominal aorta or iliac vessels. Thoracoabdominal aorta calcified atherosclerosis with mild fusiform aneurysmal dilatation of the tubular aorta, possibly poststenotic given the mild tri valvular aortic calcification. Recommend annual imaging followup by CTA or MRA. This recommendation follows 2010 ACCF/AHA/AATS/ACR/ASA/SCA/SCAI/SIR/STS/SVM Guidelines for the Diagnosis and Management of Patients with Thoracic Aortic Disease. Circulation. 2010; [id number]. Aortic aneurysm NOS (ICD10-I71.9) Patent major abdominal arteries with mild to moderate calcified atherosclerosis. Small 1.8 cm calcified splenic artery aneurysm. Pulmonary artery hypertension. Four-vessel coronary calcification, severe over a long segment of the LAD. Cardiac stress testing could be considered. Small hiatal hernia. Minimal periportal edema. Large stool burden without bowel obstruction. Electronically Signed   By: Revonda Humphrey   On: 12/28/2019 15:48    Procedures Procedures (including critical care time)  Medications Ordered in ED Medications  nitroGLYCERIN (NITROSTAT) SL tablet 0.4 mg (has no administration in time range)  morphine 4 MG/ML injection 4 mg (4 mg Intravenous Given 12/28/19 1415)  ondansetron (ZOFRAN-ODT) disintegrating tablet 4 mg (4 mg Oral Given 12/28/19 1415)  iohexol (OMNIPAQUE) 350 MG/ML injection 100 mL (100 mLs Intravenous Contrast Given 12/28/19 1505)    ED Course  I have reviewed the triage vital signs and the nursing notes.  Pertinent labs & imaging results that were  available during my care of the patient were reviewed  by me and considered in my medical decision making (see chart for details).    MDM Rules/Calculators/A&P                      79 yo female here with epigastric and chest pain that started suddenly about 1 hour prior to arrival. Patient received ASA 384mg , x4 nitro, 4mg  zofran, and 4mg  morphine prior to arrival to the ED. She reports taking her normal medications this am. She appears to be in significant discomfort on exam without resolution from previous nitro, morphine or ASA. EKG with no significant change from prior noted at bedside. Will obtain hsTrop, CMP, CBC and lipase. Given patient's hx of  mild aortic aneurysm with the ascending aorta measuring 4.2 cm  will also obtain CTA chest/abd/pelvis for r/o of dissection.   DG chest shows no acute cardiopulmonary disease but does show 1.2 x 0.8 cm nodule in RML, new compared to the prior examination. Follow-up standing PA and lateral chest radiograph is recommended in 1-2 months. Troponin negative. Patient reports having previously had gallbladder removed. CTA was neg for dissection. Patient's pain may be 2/2 biliary cholic or GERD. Will follow up second troponin and if pain continues to remain improved patient may be able to be sent home. Will sign out patient to night team to follow up on troponin.    TRISIA BOWELL was evaluated in Emergency Department on 12/28/2019 for the symptoms described in the history of present illness. She was evaluated in the context of the global COVID-19 pandemic, which necessitated consideration that the patient might be at risk for infection with the SARS-CoV-2 virus that causes COVID-19. Institutional protocols and algorithms that pertain to the evaluation of patients at risk for COVID-19 are in a state of rapid change based on information released by regulatory bodies including the CDC and federal and state organizations. These policies and algorithms were followed  during the patient's care in the ED. Final Clinical Impression(s) / ED Diagnoses Final diagnoses:  None    Rx / DC Orders ED Discharge Orders    None       Loray Akard, [place], DO 12/28/19 Avery, [place], DO 12/28/19 Beckham, [place], DO 12/28/19 1617    Drenda Freeze, MD 12/28/19 1620

## 2019-12-28 NOTE — Discharge Instructions (Addendum)
Please return for any problem.  Follow-up with your cardiology care as instructed.

## 2019-12-28 NOTE — ED Notes (Signed)
Patient verbalizes understanding of discharge instructions. Opportunity for questioning and answers were provided. Armband removed by staff, pt discharged from ED.  

## 2019-12-28 NOTE — ED Triage Notes (Signed)
Pt brought in by [facility] for epigastric pain x1 hour. Pt given x4 nitro, 4mg  zofran, and 4mg  morphine. Pt denies relief of pain or nausea. Pt endorses hx of stent placement. Pt A+Ox4, skin cool and dry.

## 2019-12-28 NOTE — ED Provider Notes (Signed)
  Patient seen after prior ED providers.  Work-up in the ED did not suggest ACS or other significant acute pathology.  Patient desires discharge home.  She has established cardiology care who she will call tomorrow for close follow-up.  Importance of close follow-up is stressed.  Strict return precautions given and understood.   Valarie Merino, MD 12/28/19 1750

## 2020-02-01 ENCOUNTER — Encounter: Payer: Self-pay | Admitting: Cardiology

## 2020-02-01 ENCOUNTER — Other Ambulatory Visit: Payer: Self-pay

## 2020-02-01 ENCOUNTER — Ambulatory Visit: Payer: Medicare Other | Admitting: Cardiology

## 2020-02-01 VITALS — BP 132/80 | HR 76 | Ht 64.0 in | Wt 234.0 lb

## 2020-02-01 DIAGNOSIS — I712 Thoracic aortic aneurysm, without rupture: Secondary | ICD-10-CM

## 2020-02-01 DIAGNOSIS — R1013 Epigastric pain: Secondary | ICD-10-CM

## 2020-02-01 DIAGNOSIS — E785 Hyperlipidemia, unspecified: Secondary | ICD-10-CM | POA: Diagnosis not present

## 2020-02-01 DIAGNOSIS — I2510 Atherosclerotic heart disease of native coronary artery without angina pectoris: Secondary | ICD-10-CM | POA: Diagnosis not present

## 2020-02-01 DIAGNOSIS — I10 Essential (primary) hypertension: Secondary | ICD-10-CM | POA: Diagnosis not present

## 2020-02-01 DIAGNOSIS — I7121 Aneurysm of the ascending aorta, without rupture: Secondary | ICD-10-CM

## 2020-02-01 DIAGNOSIS — I5032 Chronic diastolic (congestive) heart failure: Secondary | ICD-10-CM

## 2020-02-01 NOTE — Patient Instructions (Signed)

## 2020-02-01 NOTE — Progress Notes (Signed)
Cardiology Office Note:    Date:  02/01/2020   ID:  Brittany Robertson, DOB 1941-08-27, MRN [id number]  PCP:  Mayra Neer, MD  Cardiologist:  Fransico Him, MD    Referring MD: Mayra Neer, MD   Chief Complaint  Patient presents with  . Coronary Artery Disease  . Hyperlipidemia  . Hypertension    History of Present Illness:    Brittany Robertson is a 79 y.o. female with a hx of  ASCAD s/p PCI the LAD in 2006 with repeat cardiac cath in 2014 showing 30% mid LAD distal to the stent and otherwise normal coronary arteries and repeat heart cath 08/2016 showed normal coronary arteries with patent LAD stent and normal LVEDP. Her shortness of breath is felt to be due to obesity and deconditioning.. She has a history of hypertension, obesity,chronic diastolic CHF and hyperlipidemia. She also has a history of mild aortic aneurysm with the ascending aorta measuring 4.2 cm.  She is here today for followup and is doing well. She was recently seen in the ER with complaints of epigastric discomfrot that apparently occurred when opening a box although she did not tell me this.  She tells me that she thought it was related to her stomach.  Her cardiac workup in ER was normal and she had epigastric tenderness on exam. She says that this was nothing like her typical anginal pain and has never had it since then.  Chest/Abd?eplvis CT was neg for dissection and Cxray showed a 1.2 x 0.8cm RML nodule that was new.  Trop neg.  It was felt that her pain was related to biliary cholic or GERD and she has followed with GI.  Today she is doing well.   She denies any chest pain or pressure, SOB, DOE, PND, orthopnea, LE edema, dizziness, palpitations or syncope. She is compliant with her meds and is tolerating meds with no SE.    Past Medical History:  Diagnosis Date  . Ascending aortic aneurysm ([place])    31mm 07/2018  . Bruises easily   . Chronic diastolic CHF (congestive heart failure) ([place])   . Complication of  anesthesia    "takes a long time to wake up"bp little low when wakes up  . Coronary artery disease    PCI 2006, repeat cath 30% mid LAD distal to stent otherwise normal coronary arteries, cath 08/2013 with patent LAD stent and otherwise normal coronary arteries with diastolic dysfunction. Cath 08/2016 with normal RHC, normal LVEDP, patent LAD stent with otherwise normal coronary arteries  . DDD (degenerative disc disease), cervical   . Dilated aortic root ([place])    16mm by echo 07/2017  . GERD (gastroesophageal reflux disease)   . History of kidney stones   . Hyperlipidemia   . Hypertension   . Left ureteral stone   . OA (osteoarthritis)   . Obesity   . Osteopenia   . PONV (postoperative nausea and vomiting)    Motion sickness when move out of PACU to room  . PVC's (premature ventricular contractions)   . SOB (shortness of breath) 07/13/2016   non cardiac and secondary to deconditioning and obesity    Past Surgical History:  Procedure Laterality Date  . ABDOMINAL HYSTERECTOMY    . APPENDECTOMY  1957  . arthroscopic knee  05/2002,04/07/2005   right and then also torn muscle surgery on right knee (2003(  . arthroscopic knee  01/02/2005,09/21/2006   left  . CARDIAC CATHETERIZATION N/A 08/31/2016   Procedure: Right/Left Heart Cath  and Coronary Angiography;  Surgeon: Sherren Mocha, MD;  Location: [facility];  Service: Cardiovascular;  Laterality: N/A;  . CHOLECYSTECTOMY  1988  . COLONOSCOPY  2010   with Endo  . CORONARY ANGIOPLASTY  02/05/2004  . CYST REMOVAL HAND  2000  . CYSTOSCOPY/URETEROSCOPY/HOLMIUM LASER/STENT PLACEMENT Left 08/26/2018   Procedure: CYSTOSCOPY/RETROGRADE/URETEROSCOPY/HOLMIUM LASER/ STONE BASKETRY/ STENT PLACEMENT;  Surgeon: Ceasar Mons, MD;  Location: [facility];  Service: Urology;  Laterality: Left;  ONLY NEEDS  45 MIN  . [place] OF UTERUS  1972  . EXTRACORPOREAL SHOCK WAVE LITHOTRIPSY Right 12/12/2018    Procedure: EXTRACORPOREAL SHOCK WAVE LITHOTRIPSY (ESWL);  Surgeon: Ceasar Mons, MD;  Location: [facility];  Service: Urology;  Laterality: Right;  . EYE SURGERY  01/2007   bilateral cataract with lens implant  . FOOT SURGERY  01/2012   left and right bunion and hammer toe  . HAND SURGERY  05/29/2011   joint removed  . [place]  . HERNIA REPAIR  02/2010   incisional hernioa  . KNEE ARTHROSCOPY    . LEFT HEART CATHETERIZATION WITH CORONARY ANGIOGRAM N/A 09/19/2013   Procedure: LEFT HEART CATHETERIZATION WITH CORONARY ANGIOGRAM;  Surgeon: Sueanne Margarita, MD;  Location: [facility];  Service: Cardiovascular;  Laterality: N/A;  . neck injections  03/2014   back and neck injections from Dr. Nelva Bush  . stents     x1  . tear duct surgery  1990's   bilateral  . TONSILLECTOMY     as child  . TOTAL KNEE ARTHROPLASTY Left 01/04/2015   Procedure: LEFT TOTAL KNEE ARTHROPLASTY;  Surgeon: Sydnee Cabal, MD;  Location: [facility];  Service: Orthopedics;  Laterality: Left;  . TOTAL KNEE ARTHROPLASTY Right 05/10/2015   Procedure: TOTAL RIGHT KNEE ARTHROPLASTY;  Surgeon: Sydnee Cabal, MD;  Location: [facility];  Service: Orthopedics;  Laterality: Right;    Current Medications: Current Meds  Medication Sig  . acetaminophen (TYLENOL) 500 MG tablet Take 1,000 mg by mouth daily as needed for headache.   Marland Kitchen aspirin 81 MG tablet Take 81 mg by mouth every evening.   Marland Kitchen CALCIUM PO Take by mouth.  . cetirizine (ZYRTEC) 10 MG tablet Take 10 mg by mouth every morning.    . cholecalciferol (VITAMIN D) 400 UNITS TABS Take 400 Units by mouth every other day.   . Coenzyme Q10 Liposomal 100 MG/ML LIQD Take 100 mg by mouth every evening.  . denosumab (PROLIA) 60 MG/ML SOLN injection Inject 60 mg into the skin every 6 (six) months. Administer in upper arm, thigh, or abdomen  . docusate sodium (COLACE) 100 MG capsule Take 100 mg by mouth 2 (two) times daily.   Marland Kitchen esomeprazole (NEXIUM) 40 MG capsule Take  40 mg by mouth daily at 12 noon.  . ezetimibe (ZETIA) 10 MG tablet TAKE 1 TABLET BY MOUTH IN  THE EVENING  . fish oil-omega-3 fatty acids 1000 MG capsule Take 1 g by mouth 2 (two) times daily.    . Glucosamine-Chondroit-Vit C-Mn (GLUCOSAMINE 1500 COMPLEX PO) Take 1,500 mg by mouth 2 (two) times daily.  . meloxicam (MOBIC) 15 MG tablet Take 15 mg by mouth every evening.    . metoprolol succinate (TOPROL-XL) 25 MG 24 hr tablet TAKE ONE-HALF TABLET BY  MOUTH DAILY (Patient taking differently: Take 12.5 mg by mouth daily. )  . mirabegron ER (MYRBETRIQ) 50 MG TB24 tablet Take 50 mg by mouth every evening.   . nitroGLYCERIN (NITROSTAT) 0.4  MG SL tablet Place 1 tablet (0.4 mg total) under the tongue every 5 (five) minutes as needed. As needed for chest pain x 3 doses  . olmesartan (BENICAR) 40 MG tablet TAKE 1 TABLET BY MOUTH  DAILY (Patient taking differently: Take 40 mg by mouth daily. )  . Polyethyl Glycol-Propyl Glycol (SYSTANE ULTRA OP) Apply 1 drop to eye 2 (two) times daily as needed (dry eyes).  . potassium chloride SA (K-DUR,KLOR-CON) 20 MEQ tablet Take 1 tablet by mouth  every morning  . vitamin C (ASCORBIC ACID) 500 MG tablet Take 500 mg by mouth 2 (two) times daily.   . vitamin E 400 UNIT capsule Take 400 Units by mouth at bedtime.      Allergies:   Atorvastatin, Codeine, Sulfa antibiotics, Sulfamethoxazole, Sulfasalazine, Adhesive [tape], Other, and Penicillins   Social History   Socioeconomic History  . Marital status: Married    Spouse name: Not on file  . Number of children: Not on file  . Years of education: Not on file  . Highest education level: Not on file  Occupational History  . Not on file  Tobacco Use  . Smoking status: Never Smoker  . Smokeless tobacco: Never Used  Substance and Sexual Activity  . Alcohol use: Yes    Alcohol/week: 1.0 standard drinks    Types: 1 Glasses of wine per week    Comment: occassionally  . Drug use: No  . Sexual activity: Not on file   Other Topics Concern  . Not on file  Social History Narrative  . Not on file   Social Determinants of Health   Financial Resource Strain:   . Difficulty of Paying Living Expenses:   Food Insecurity:   . Worried About Charity fundraiser in the Last Year:   . Arboriculturist in the Last Year:   Transportation Needs:   . Film/video editor (Medical):   Marland Kitchen Lack of Transportation (Non-Medical):   Physical Activity:   . Days of Exercise per Week:   . Minutes of Exercise per Session:   Stress:   . Feeling of Stress :   Social Connections:   . Frequency of Communication with Friends and Family:   . Frequency of Social Gatherings with Friends and Family:   . Attends Religious Services:   . Active Member of Clubs or Organizations:   . Attends Archivist Meetings:   Marland Kitchen Marital Status:      Family History: The patient's family history includes Cancer in her mother; Heart Problems in her father; Hypertension in her father and mother.  ROS:   Please see the history of present illness.    ROS  All other systems reviewed and negative.   EKGs/Labs/Other Studies Reviewed:    The following studies were reviewed today:hospital notes from recent ER visit.  Outside labs from PCP  EKG:  EKG is not ordered today.    Recent Labs: 12/28/2019: ALT 81; BUN 25; Creatinine, Ser 0.80; Hemoglobin 12.6; Platelets PLATELET CLUMPS NOTED ON SMEAR, UNABLE TO ESTIMATE; Potassium 4.0; Sodium 140   Recent Lipid Panel    Component Value Date/Time   CHOL 102 07/05/2019 0730   TRIG 103 07/05/2019 0730   HDL 48 07/05/2019 0730   CHOLHDL 2.1 07/05/2019 0730   CHOLHDL 2.1 04/24/2016 0850   VLDL 26 04/24/2016 0850   LDLCALC 35 07/05/2019 0730    Physical Exam:    VS:  BP 132/80   Pulse 76   Ht  5\' 4"  (1.626 m)   Wt 234 lb (106.1 kg)   SpO2 98%   BMI 40.17 kg/m     Wt Readings from Last 3 Encounters:  02/01/20 234 lb (106.1 kg)  03/09/19 231 lb (104.8 kg)  12/12/18 222 lb 12.8 oz  (101.1 kg)     GEN:  Well nourished, well developed in no acute distress HEENT: Normal NECK: No JVD; No carotid bruits LYMPHATICS: No lymphadenopathy CARDIAC: RRR, no murmurs, rubs, gallops RESPIRATORY:  Clear to auscultation without rales, wheezing or rhonchi  ABDOMEN: Soft, non-tender, non-distended MUSCULOSKELETAL:  No edema; No deformity  SKIN: Warm and dry NEUROLOGIC:  Alert and oriented x 3 PSYCHIATRIC:  Normal affect   ASSESSMENT:    1. Coronary artery disease involving native coronary artery of native heart without angina pectoris   2. Dyslipidemia, goal LDL below 70   3. Essential hypertension   4. Ascending aortic aneurysm ([place])   5. Chronic diastolic CHF (congestive heart failure) ([facility])   6. Epigastric pain    PLAN:    In order of problems listed above:  1.  ASCAD -S/p PCI of the LAD in 2006 -repeat cardiac cath in 2014 showing 30% mid LAD distal to the stent and otherwise normal coronary arteries and repeat heart cath 08/2016 showed normal coronary arteries with patent LAD stent and normal LVEDP -she has not had any of her typical anginal CP -continue ASA 81mg  daily, BB and statin  2.  HLD -LDL goal is < 70 -LDL was 32 a year ago -repeat FLP and ALT in June 2021 -continue Zetia 10mg  daily -she is statin intolerant  3.  HTN -BP controlled -continue Toprol XL 12.5mg  daily and Benicar 40mg  daily -creatinine normal at 0.8 and K+ 4 in March 2021  4.  Ascending aortic aneurysm -2D echo showed 3.5 x 4.2cm -repeat Chest CTA in 1 year to followup  5.  Chronic diastolic CHF -appears euvolemic on exam -she has not required diuretic therapy  6.  Epigastric pain -she had a recent ER visit in March after an episode of epigastric pain that started after opening a box in her kitchen -in ER was tender in the epigastrium and felt to be related to GERD -she is seeing Dr. Watt Climes -cardiac workup unremarkable and it was completely different than her typical angina  -chest CTA for dissection was negative but noted coronary disease in all 4 vessels and severe in LAD - I reviewed the images and the portion of LAD called as severe atherosclerosis is actually her known LAD stent. -no further ischemic workup unless she has recurrent discomfort   Medication Adjustments/Labs and Tests Ordered: Current medicines are reviewed at length with the patient today.  Concerns regarding medicines are outlined above.  No orders of the defined types were placed in this encounter.  No orders of the defined types were placed in this encounter.   Signed, Fransico Him, MD  02/01/2020 8:58 PM    [place]

## 2020-02-14 ENCOUNTER — Other Ambulatory Visit: Payer: Self-pay | Admitting: Family Medicine

## 2020-02-14 DIAGNOSIS — Z1231 Encounter for screening mammogram for malignant neoplasm of breast: Secondary | ICD-10-CM

## 2020-02-15 ENCOUNTER — Ambulatory Visit
Admission: RE | Admit: 2020-02-15 | Discharge: 2020-02-15 | Disposition: A | Discharge status: Home / Self Care | Payer: Medicare Other | Source: Ambulatory Visit | Attending: Family Medicine | Admitting: Family Medicine

## 2020-02-15 ENCOUNTER — Other Ambulatory Visit: Payer: Self-pay

## 2020-02-15 DIAGNOSIS — M8580 Other specified disorders of bone density and structure, unspecified site: Secondary | ICD-10-CM

## 2020-03-29 ENCOUNTER — Ambulatory Visit
Admission: RE | Admit: 2020-03-29 | Discharge: 2020-03-29 | Disposition: A | Discharge status: Home / Self Care | Payer: Medicare Other | Source: Ambulatory Visit | Attending: Family Medicine | Admitting: Family Medicine

## 2020-03-29 ENCOUNTER — Other Ambulatory Visit: Payer: Self-pay

## 2020-03-29 DIAGNOSIS — Z1231 Encounter for screening mammogram for malignant neoplasm of breast: Secondary | ICD-10-CM

## 2020-04-03 ENCOUNTER — Other Ambulatory Visit: Payer: Self-pay | Admitting: Family Medicine

## 2020-04-03 DIAGNOSIS — R928 Other abnormal and inconclusive findings on diagnostic imaging of breast: Secondary | ICD-10-CM

## 2020-04-07 ENCOUNTER — Other Ambulatory Visit: Payer: Self-pay | Admitting: Cardiology

## 2020-04-11 ENCOUNTER — Ambulatory Visit
Admission: RE | Admit: 2020-04-11 | Discharge: 2020-04-11 | Disposition: A | Discharge status: Home / Self Care | Payer: Medicare Other | Source: Ambulatory Visit | Attending: Family Medicine | Admitting: Family Medicine

## 2020-04-11 ENCOUNTER — Other Ambulatory Visit: Payer: Self-pay

## 2020-04-11 DIAGNOSIS — R928 Other abnormal and inconclusive findings on diagnostic imaging of breast: Secondary | ICD-10-CM

## 2020-04-24 ENCOUNTER — Encounter: Payer: Self-pay | Admitting: *Deleted

## 2020-04-30 ENCOUNTER — Other Ambulatory Visit: Payer: Self-pay

## 2020-04-30 ENCOUNTER — Encounter: Payer: Self-pay | Admitting: Physician Assistant

## 2020-04-30 ENCOUNTER — Ambulatory Visit: Payer: Medicare Other | Admitting: Physician Assistant

## 2020-04-30 DIAGNOSIS — L2089 Other atopic dermatitis: Secondary | ICD-10-CM | POA: Diagnosis not present

## 2020-04-30 DIAGNOSIS — L570 Actinic keratosis: Secondary | ICD-10-CM | POA: Diagnosis not present

## 2020-04-30 DIAGNOSIS — Z1283 Encounter for screening for malignant neoplasm of skin: Secondary | ICD-10-CM

## 2020-04-30 MED ORDER — TRIAMCINOLONE ACETONIDE 0.1 % EX CREA: 1.0000 "application " | TOPICAL_CREAM | Freq: Two times a day (BID) | CUTANEOUS | 3 refills | Status: DC | PRN

## 2020-04-30 NOTE — Progress Notes (Signed)
   Follow-Up Visit   Subjective  Brittany Robertson is a 79 y.o. female who presents for the following: Annual Exam (abdomen- place that interfers with bra, right inner ankle & right knee x months- itches and comes & goes, right thigh- wants LN2 again- we froze it last office visit).   The following portions of the chart were reviewed this encounter and updated as appropriate: Tobacco  Allergies  Meds  Problems  Med Hx  Surg Hx  Fam Hx      Objective  Well appearing patient in no apparent distress; mood and affect are within normal limits.  A focused examination was performed including arms, legs, back, chest and abdomen. Relevant physical exam findings are noted in the Assessment and Plan.  Objective  Left Knee - Anterior, right medial ankle: Ill-defined pink papules/plaques with scale-crust.   Objective  Left Abdomen (side) - Upper, Left Thigh - Anterior: Erythematous patches with gritty scale.  Assessment & Plan  Other atopic dermatitis (2) Left Knee - Anterior; right medial ankle  triamcinolone cream (KENALOG) 0.1 % - Left Knee - Anterior, right medial ankle  AK (actinic keratosis) (2) Left Thigh - Anterior; Left Abdomen (side) - Upper  Destruction of lesion - Left Abdomen (side) - Upper, Left Thigh - Anterior Complexity: simple   Destruction method: cryotherapy   Informed consent: discussed and consent obtained   Timeout:  patient name, date of birth, surgical site, and procedure verified Lesion destroyed using liquid nitrogen: Yes   Cryotherapy cycles:  1 Outcome: patient tolerated procedure well with no complications   Post-procedure details: wound care instructions given     I, Kinsley Holderman, PA-C, have reviewed all documentation's for this visit.  The documentation on 04/30/20 for the exam, diagnosis, procedures and orders are all accurate and complete.

## 2020-06-07 ENCOUNTER — Ambulatory Visit: Payer: Medicare Other | Admitting: Physician Assistant

## 2020-06-27 ENCOUNTER — Other Ambulatory Visit: Payer: Self-pay

## 2020-06-27 ENCOUNTER — Ambulatory Visit: Payer: Medicare Other | Admitting: Physician Assistant

## 2020-06-27 ENCOUNTER — Encounter: Payer: Self-pay | Admitting: Physician Assistant

## 2020-06-27 DIAGNOSIS — L570 Actinic keratosis: Secondary | ICD-10-CM | POA: Diagnosis not present

## 2020-06-27 DIAGNOSIS — L2089 Other atopic dermatitis: Secondary | ICD-10-CM | POA: Diagnosis not present

## 2020-06-27 DIAGNOSIS — Z1283 Encounter for screening for malignant neoplasm of skin: Secondary | ICD-10-CM | POA: Diagnosis not present

## 2020-06-27 MED ORDER — TRIAMCINOLONE ACETONIDE 10 MG/ML IJ SUSP
5.0000 mg | Freq: Once | INTRAMUSCULAR | Status: AC
  Administered 2020-06-27: 5 mg

## 2020-06-27 NOTE — Progress Notes (Signed)
° °  Follow-Up Visit   Subjective  Brittany Robertson is a 79 y.o. female who presents for the following: Follow-up (6 month follow up--skin check).   The following portions of the chart were reviewed this encounter and updated as appropriate: Tobacco   Allergies   Meds   Problems   Med Hx   Surg Hx   Fam Hx       Objective  Well appearing patient in no apparent distress; mood and affect are within normal limits.  A full examination was performed including scalp, head, eyes, ears, nose, lips, neck, chest, axillae, abdomen, back, buttocks, bilateral upper extremities, bilateral lower extremities, hands, feet, fingers, toes, fingernails, and toenails. All findings within normal limits unless otherwise noted below.  Objective  Head - to toe, Right Instep: No atypical nevi No signs of non-mole skin cancer.   Objective  chest x 3: Erythematous patches with gritty scale.  Objective  Right Foot - medial calcaneous: xerosis  Assessment & Plan  Screening exam for skin cancer (2) Head - to toe; Right Instep  observe  AK (actinic keratosis) chest x 3  Destruction of lesion - chest x 3 Complexity: simple   Destruction method: cryotherapy   Informed consent: discussed and consent obtained   Timeout:  patient name, date of birth, surgical site, and procedure verified Lesion destroyed using liquid nitrogen: Yes   Cryotherapy cycles:  1 Outcome: patient tolerated procedure well with no complications   Post-procedure details: wound care instructions given    Other atopic dermatitis Right Foot - medial calcaneous  triamcinolone cream (KENALOG) 0.1 % - Right Foot - medial calcaneous  Intralesional injection - Right Foot - medial calcaneous Location:right calcaneous medial  Informed Consent: Discussed risks (infection, pain, bleeding, bruising, thinning of the skin, loss of skin pigment, lack of resolution, and recurrence of lesion) and benefits of the procedure, as well as the  alternatives. Informed consent was obtained. Preparation: The area was prepared a standard fashion.  Anesthesia:none  Procedure Details: An intralesional injection was performed with Kenalog 5 mg/cc. 0.25cc in total were injected.  Total number of injections:4  Plan: The patient was instructed on post-op care. Recommend OTC analgesia as needed for pain.    I, Lalena Salas, PA-C, have reviewed all documentation's for this visit.  The documentation on 06/27/20 for the exam, diagnosis, procedures and orders are all accurate and complete.

## 2020-07-10 ENCOUNTER — Other Ambulatory Visit: Payer: Self-pay | Admitting: Physician Assistant

## 2020-07-10 DIAGNOSIS — R109 Unspecified abdominal pain: Secondary | ICD-10-CM

## 2020-07-10 DIAGNOSIS — Z87442 Personal history of urinary calculi: Secondary | ICD-10-CM

## 2020-07-10 DIAGNOSIS — R1012 Left upper quadrant pain: Secondary | ICD-10-CM

## 2020-07-11 ENCOUNTER — Ambulatory Visit
Admission: RE | Admit: 2020-07-11 | Discharge: 2020-07-11 | Disposition: A | Discharge status: Home / Self Care | Payer: Medicare Other | Source: Ambulatory Visit | Attending: Physician Assistant | Admitting: Physician Assistant

## 2020-07-11 ENCOUNTER — Other Ambulatory Visit: Payer: Medicare Other

## 2020-07-11 DIAGNOSIS — Z87442 Personal history of urinary calculi: Secondary | ICD-10-CM

## 2020-07-11 DIAGNOSIS — R109 Unspecified abdominal pain: Secondary | ICD-10-CM

## 2020-07-11 DIAGNOSIS — R1012 Left upper quadrant pain: Secondary | ICD-10-CM

## 2020-07-11 MED ORDER — IOPAMIDOL (ISOVUE-300) INJECTION 61%
100.0000 mL | Freq: Once | INTRAVENOUS | Status: AC | PRN
  Administered 2020-07-11: 100 mL via INTRAVENOUS

## 2020-08-10 ENCOUNTER — Emergency Department (HOSPITAL_COMMUNITY)
Admission: EM | Admit: 2020-08-10 | Discharge: 2020-08-10 | Disposition: A | Discharge status: Home / Self Care | Payer: Medicare Other | Attending: Emergency Medicine | Admitting: Emergency Medicine

## 2020-08-10 ENCOUNTER — Emergency Department (HOSPITAL_COMMUNITY): Payer: Medicare Other

## 2020-08-10 ENCOUNTER — Other Ambulatory Visit: Payer: Self-pay

## 2020-08-10 DIAGNOSIS — Z7982 Long term (current) use of aspirin: Secondary | ICD-10-CM | POA: Insufficient documentation

## 2020-08-10 DIAGNOSIS — I5032 Chronic diastolic (congestive) heart failure: Secondary | ICD-10-CM | POA: Insufficient documentation

## 2020-08-10 DIAGNOSIS — R109 Unspecified abdominal pain: Secondary | ICD-10-CM | POA: Diagnosis present

## 2020-08-10 DIAGNOSIS — I110 Hypertensive heart disease with heart failure: Secondary | ICD-10-CM | POA: Insufficient documentation

## 2020-08-10 DIAGNOSIS — I714 Abdominal aortic aneurysm, without rupture: Secondary | ICD-10-CM | POA: Diagnosis not present

## 2020-08-10 DIAGNOSIS — R944 Abnormal results of kidney function studies: Secondary | ICD-10-CM | POA: Insufficient documentation

## 2020-08-10 DIAGNOSIS — N200 Calculus of kidney: Secondary | ICD-10-CM | POA: Insufficient documentation

## 2020-08-10 DIAGNOSIS — Z96653 Presence of artificial knee joint, bilateral: Secondary | ICD-10-CM | POA: Insufficient documentation

## 2020-08-10 DIAGNOSIS — R7989 Other specified abnormal findings of blood chemistry: Secondary | ICD-10-CM

## 2020-08-10 LAB — URINALYSIS, ROUTINE W REFLEX MICROSCOPIC
Bilirubin Urine: NEGATIVE
Glucose, UA: NEGATIVE mg/dL
Ketones, ur: 20 mg/dL — AB
Nitrite: NEGATIVE
Protein, ur: 30 mg/dL — AB
RBC / HPF: >50 RBC/hpf — ABNORMAL HIGH (ref 0–5)
Specific Gravity, Urine: 1.024 (ref 1.005–1.030)
pH: 5 (ref 5.0–8.0)

## 2020-08-10 LAB — CBC WITH DIFFERENTIAL/PLATELET
Abs Immature Granulocytes: 0.06 10*3/uL (ref 0.00–0.07)
Basophils Absolute: 0.1 10*3/uL (ref 0.0–0.1)
Basophils Relative: 1 %
Eosinophils Absolute: 0.1 10*3/uL (ref 0.0–0.5)
Eosinophils Relative: 1 %
HCT: 38.8 % (ref 36.0–46.0)
Hemoglobin: 12.9 g/dL (ref 12.0–15.0)
Immature Granulocytes: 1 %
Lymphocytes Relative: 11 %
Lymphs Abs: 1.3 10*3/uL (ref 0.7–4.0)
MCH: 32.5 pg (ref 26.0–34.0)
MCHC: 33.2 g/dL (ref 30.0–36.0)
MCV: 97.7 fL (ref 80.0–100.0)
Monocytes Absolute: 0.9 10*3/uL (ref 0.1–1.0)
Monocytes Relative: 8 %
Neutro Abs: 9.4 10*3/uL — ABNORMAL HIGH (ref 1.7–7.7)
Neutrophils Relative %: 78 %
Platelets: 173 10*3/uL (ref 150–400)
RBC: 3.97 MIL/uL (ref 3.87–5.11)
RDW: 13.5 % (ref 11.5–15.5)
WBC: 11.8 10*3/uL — ABNORMAL HIGH (ref 4.0–10.5)
nRBC: 0 % (ref 0.0–0.2)

## 2020-08-10 LAB — BASIC METABOLIC PANEL
Anion gap: 15 (ref 5–15)
BUN: 27 mg/dL — ABNORMAL HIGH (ref 8–23)
CO2: 18 mmol/L — ABNORMAL LOW (ref 22–32)
Calcium: 9 mg/dL (ref 8.9–10.3)
Chloride: 107 mmol/L (ref 98–111)
Creatinine, Ser: 1.15 mg/dL — ABNORMAL HIGH (ref 0.44–1.00)
GFR, Estimated: 48 mL/min — ABNORMAL LOW (ref >60)
Glucose, Bld: 164 mg/dL — ABNORMAL HIGH (ref 70–99)
Potassium: 3.8 mmol/L (ref 3.5–5.1)
Sodium: 140 mmol/L (ref 135–145)

## 2020-08-10 MED ORDER — HYDROCODONE-ACETAMINOPHEN 5-325 MG PO TABS: 1.0000 | ORAL_TABLET | ORAL | 0 refills | Status: DC | PRN

## 2020-08-10 MED ORDER — ONDANSETRON HCL 4 MG/2ML IJ SOLN
4.0000 mg | Freq: Once | INTRAMUSCULAR | Status: AC
  Administered 2020-08-10: 4 mg via INTRAVENOUS
  Filled 2020-08-10: qty 2

## 2020-08-10 MED ORDER — MORPHINE SULFATE (PF) 4 MG/ML IV SOLN
4.0000 mg | Freq: Once | INTRAVENOUS | Status: AC
  Administered 2020-08-10: 4 mg via INTRAVENOUS
  Filled 2020-08-10: qty 1

## 2020-08-10 NOTE — ED Provider Notes (Signed)
[facility] DEPT Provider Note   CSN: [id number] Arrival date & time: 08/10/20  0030     History Chief Complaint  Patient presents with  . Back Pain  . Flank Pain    Brittany Robertson is a 79 y.o. female.  Patient is a 80 year old female who presents with flank pain.  She has a history of kidney stones as well as a AAA.  She presents with pain that started in her back earlier this evening.  It now radiates to her left lower abdomen.  She says it feels similar to her prior kidney stones.  She has had some associated nausea and vomiting.  No fevers.  No obvious hematuria or difficulty with urination.  She was brought in by EMS and was given 250 mcg of fentanyl and 4 mg of Zofran prior to arrival with no improvement in pain.        Past Medical History:  Diagnosis Date  . Ascending aortic aneurysm ([place])    66mm 07/2018  . Bruises easily   . Chronic diastolic CHF (congestive heart failure) ([place])   . Complication of anesthesia    "takes a long time to wake up"bp little low when wakes up  . Coronary artery disease    PCI 2006, repeat cath 30% mid LAD distal to stent otherwise normal coronary arteries, cath 08/2013 with patent LAD stent and otherwise normal coronary arteries with diastolic dysfunction. Cath 08/2016 with normal RHC, normal LVEDP, patent LAD stent with otherwise normal coronary arteries  . DDD (degenerative disc disease), cervical   . Dilated aortic root ([place])    68mm by echo 07/2017  . GERD (gastroesophageal reflux disease)   . History of kidney stones   . Hyperlipidemia   . Hypertension   . Left ureteral stone   . OA (osteoarthritis)   . Obesity   . Osteopenia   . PONV (postoperative nausea and vomiting)    Motion sickness when move out of PACU to room  . PVC's (premature ventricular contractions)   . SOB (shortness of breath) 07/13/2016   non cardiac and secondary to deconditioning and obesity    Patient Active Problem List     Diagnosis Date Noted  . Ascending aortic aneurysm ([place]) 03/08/2019  . Hypersomnia 10/30/2016  . Exertional dyspnea 10/30/2016  . Nodule of right lung 08/10/2016  . History of snoring 08/10/2016  . Sensation of lump in throat 08/10/2016  . Other fatigue 08/10/2016  . SOB (shortness of breath) 07/13/2016  . Chest crackles 06/29/2016  . Abnormal PFT 06/29/2016  . Osteoarthritis of right knee 05/10/2015  . S/P total knee replacement 05/10/2015  . S/P knee replacement 05/10/2015  . Primary osteoarthritis of left knee 01/04/2015  . S/P total knee arthroplasty 01/04/2015  . Chronic diastolic CHF (congestive heart failure) ([place])   . CAD (coronary artery disease) 09/19/2013  . Dyspnea and respiratory abnormality 08/15/2013  . Dyslipidemia, goal LDL below 70 08/15/2013  . PVC's (premature ventricular contractions)   . Fall 09/28/2011  . Multiple fractures of ribs of left side 09/28/2011  . Bilateral contusions of knee 09/28/2011  . Obesity 09/28/2011  . Hypertension   . Degenerative disk disease     Past Surgical History:  Procedure Laterality Date  . ABDOMINAL HYSTERECTOMY    . APPENDECTOMY  1957  . arthroscopic knee  05/2002,04/07/2005   right and then also torn muscle surgery on right knee (2003(  . arthroscopic knee  01/02/2005,09/21/2006   left  .  CARDIAC CATHETERIZATION N/A 08/31/2016   Procedure: Right/Left Heart Cath and Coronary Angiography;  Surgeon: Sherren Mocha, MD;  Location: [facility];  Service: Cardiovascular;  Laterality: N/A;  . CHOLECYSTECTOMY  1988  . COLONOSCOPY  2010   with Endo  . CORONARY ANGIOPLASTY  02/05/2004  . CYST REMOVAL HAND  2000  . CYSTOSCOPY/URETEROSCOPY/HOLMIUM LASER/STENT PLACEMENT Left 08/26/2018   Procedure: CYSTOSCOPY/RETROGRADE/URETEROSCOPY/HOLMIUM LASER/ STONE BASKETRY/ STENT PLACEMENT;  Surgeon: Ceasar Mons, MD;  Location: [facility];  Service: Urology;  Laterality: Left;  ONLY NEEDS  45 MIN  .  [place] OF UTERUS  1972  . EXTRACORPOREAL SHOCK WAVE LITHOTRIPSY Right 12/12/2018   Procedure: EXTRACORPOREAL SHOCK WAVE LITHOTRIPSY (ESWL);  Surgeon: Ceasar Mons, MD;  Location: [facility];  Service: Urology;  Laterality: Right;  . EYE SURGERY  01/2007   bilateral cataract with lens implant  . FOOT SURGERY  01/2012   left and right bunion and hammer toe  . HAND SURGERY  05/29/2011   joint removed  . [place]  . HERNIA REPAIR  02/2010   incisional hernioa  . KNEE ARTHROSCOPY    . LEFT HEART CATHETERIZATION WITH CORONARY ANGIOGRAM N/A 09/19/2013   Procedure: LEFT HEART CATHETERIZATION WITH CORONARY ANGIOGRAM;  Surgeon: Sueanne Margarita, MD;  Location: [facility];  Service: Cardiovascular;  Laterality: N/A;  . neck injections  03/2014   back and neck injections from Dr. Nelva Bush  . stents     x1  . tear duct surgery  1990's   bilateral  . TONSILLECTOMY     as child  . TOTAL KNEE ARTHROPLASTY Left 01/04/2015   Procedure: LEFT TOTAL KNEE ARTHROPLASTY;  Surgeon: Sydnee Cabal, MD;  Location: [facility];  Service: Orthopedics;  Laterality: Left;  . TOTAL KNEE ARTHROPLASTY Right 05/10/2015   Procedure: TOTAL RIGHT KNEE ARTHROPLASTY;  Surgeon: Sydnee Cabal, MD;  Location: [facility];  Service: Orthopedics;  Laterality: Right;     OB History   No obstetric history on file.     Family History  Problem Relation Age of Onset  . Hypertension Mother   . Cancer Mother   . Hypertension Father   . Heart Problems Father     Social History   Tobacco Use  . Smoking status: Never Smoker  . Smokeless tobacco: Never Used  Vaping Use  . Vaping Use: Never used  Substance Use Topics  . Alcohol use: Yes    Alcohol/week: 1.0 standard drink    Types: 1 Glasses of wine per week    Comment: occassionally  . Drug use: No    Home Medications Prior to Admission medications   Medication Sig Start Date End Date Taking? Authorizing Provider  acetaminophen (TYLENOL)  500 MG tablet Take 1,000 mg by mouth daily as needed for headache.    Yes [provider]  aspirin 81 MG tablet Take 81 mg by mouth every evening.    Yes [provider]  Calcium Carb-Cholecalciferol (CALCIUM 500/D) 500-400 MG-UNIT CHEW Chew 1 tablet by mouth in the morning and at bedtime. On Sunday Tuesday Thursday and Saturday   Yes [provider]  cetirizine (ZYRTEC) 10 MG tablet Take 10 mg by mouth every morning.     Yes [provider]  cholecalciferol (VITAMIN D) 400 UNITS TABS Take 400 Units by mouth every other day.    Yes [provider]  Coenzyme Q10 Liposomal 100 MG/ML LIQD Take 100 mg by mouth every evening.  Yes [provider]  denosumab (PROLIA) 60 MG/ML SOLN injection Inject 60 mg into the skin every 6 (six) months. Administer in upper arm, thigh, or abdomen   Yes [provider]  docusate sodium (COLACE) 100 MG capsule Take 100 mg by mouth 2 (two) times daily.    Yes [provider]  ezetimibe (ZETIA) 10 MG tablet TAKE 1 TABLET BY MOUTH IN  THE EVENING 04/08/20  Yes Turner, Eber Hong, MD  fish oil-omega-3 fatty acids 1000 MG capsule Take 1 g by mouth 2 (two) times daily.     Yes [provider]  furosemide (LASIX) 20 MG tablet Take 20 mg by mouth once as needed for fluid or edema.   Yes [provider]  Glucosamine 500 MG CAPS Take 1 capsule by mouth in the morning and at bedtime.   Yes [provider]  meloxicam (MOBIC) 15 MG tablet Take 15 mg by mouth every evening.     Yes [provider]  methocarbamol (ROBAXIN) 500 MG tablet Take 500 mg by mouth 4 (four) times daily as needed for muscle spasms (cramps).   Yes [provider]  metoprolol succinate (TOPROL-XL) 25 MG 24 hr tablet Take 0.5 tablets (12.5 mg total) by mouth daily. 04/08/20  Yes Turner, Eber Hong, MD  nitroGLYCERIN (NITROSTAT) 0.4 MG SL tablet Place 1 tablet (0.4 mg total) under the tongue every 5 (five)  minutes as needed. As needed for chest pain x 3 doses 12/28/19  Yes Enid Derry, [place], DO  olmesartan (BENICAR) 40 MG tablet Take 1 tablet (40 mg total) by mouth daily. 04/08/20  Yes Turner, Eber Hong, MD  omeprazole (PRILOSEC) 40 MG capsule Take 40 mg by mouth 3 (three) times daily before meals.   Yes [provider]  potassium chloride SA (K-DUR,KLOR-CON) 20 MEQ tablet Take 1 tablet by mouth  every morning 12/04/15  Yes Turner, Traci R, MD  rosuvastatin (CRESTOR) 20 MG tablet Take 20 mg by mouth daily.   Yes [provider]  vitamin C (ASCORBIC ACID) 500 MG tablet Take 500 mg by mouth 2 (two) times daily.    Yes [provider]  HYDROcodone-acetaminophen (NORCO/VICODIN) 5-325 MG tablet Take 1 tablet by mouth every 4 (four) hours as needed. 08/10/20   Malvin Johns, MD  triamcinolone cream (KENALOG) 0.1 % Apply 1 application topically 2 (two) times daily as needed. Patient not taking: Reported on 08/10/2020 04/30/20   [facility] Danes, PA-C    Allergies    Atorvastatin, Codeine, Sulfa antibiotics, Sulfamethoxazole, Sulfasalazine, Adhesive [tape], Other, and Penicillins  Review of Systems   Review of Systems  Constitutional: Negative for chills, diaphoresis, fatigue and fever.  HENT: Negative for congestion, rhinorrhea and sneezing.   Eyes: Negative.   Respiratory: Negative for cough, chest tightness and shortness of breath.   Cardiovascular: Negative for chest pain and leg swelling.  Gastrointestinal: Positive for abdominal pain, nausea and vomiting. Negative for blood in stool and diarrhea.  Genitourinary: Positive for flank pain. Negative for difficulty urinating, frequency and hematuria.  Musculoskeletal: Negative for arthralgias and back pain.  Skin: Negative for rash.  Neurological: Negative for dizziness, speech difficulty, weakness, numbness and headaches.    Physical Exam Updated Vital Signs BP (!) 142/73   Pulse 72   Temp (!) 97.5 F (36.4 C) (Oral)    Resp (!) 21   Ht 5\' 4"  (1.626 m)   Wt 104.3 kg   SpO2 98%   BMI 39.48 kg/m   Physical Exam  Constitutional:      Appearance: She is well-developed.     Comments: Appears uncomfortable  HENT:     Head: Normocephalic and atraumatic.  Eyes:     Pupils: Pupils are equal, round, and reactive to light.  Cardiovascular:     Rate and Rhythm: Normal rate and regular rhythm.     Heart sounds: Normal heart sounds.  Pulmonary:     Effort: Pulmonary effort is normal. No respiratory distress.     Breath sounds: Normal breath sounds. No wheezing or rales.  Chest:     Chest wall: No tenderness.  Abdominal:     General: Bowel sounds are normal.     Palpations: Abdomen is soft.     Tenderness: There is abdominal tenderness. There is no guarding or rebound.     Comments: Tenderness to the left lower abdomen and left flank  Musculoskeletal:        General: Normal range of motion.     Cervical back: Normal range of motion and neck supple.  Lymphadenopathy:     Cervical: No cervical adenopathy.  Skin:    General: Skin is warm and dry.     Findings: No rash.  Neurological:     Mental Status: She is alert and oriented to person, place, and time.     ED Results / Procedures / Treatments   Labs (all labs ordered are listed, but only abnormal results are displayed) Labs Reviewed  BASIC METABOLIC PANEL - Abnormal; Notable for the following components:      Result Value   CO2 18 (*)    Glucose, Bld 164 (*)    BUN 27 (*)    Creatinine, Ser 1.15 (*)    GFR, Estimated 48 (*)    All other components within normal limits  CBC WITH DIFFERENTIAL/PLATELET - Abnormal; Notable for the following components:   WBC 11.8 (*)    Neutro Abs 9.4 (*)    All other components within normal limits  URINALYSIS, ROUTINE W REFLEX MICROSCOPIC - Abnormal; Notable for the following components:   APPearance HAZY (*)    Hgb urine dipstick MODERATE (*)    Ketones, ur 20 (*)    Protein, ur 30 (*)    Leukocytes,Ua  SMALL (*)    RBC / HPF >50 (*)    Bacteria, UA RARE (*)    All other components within normal limits    EKG EKG Interpretation  Date/Time:  Saturday August 10 2020 00:53:47 EDT Ventricular Rate:  66 PR Interval:    QRS Duration: 101 QT Interval:  449 QTC Calculation: 471 R Axis:   24 Text Interpretation: Sinus rhythm Abnormal R-wave progression, early transition since last tracing no significant change Confirmed by Malvin Johns [phone]) on 08/10/2020 1:17:47 AM   Radiology CT Renal Stone Study  Result Date: 08/10/2020 CLINICAL DATA:  Left lower back pain and flank pain. Previous history of kidney stones. EXAM: CT ABDOMEN AND PELVIS WITHOUT CONTRAST TECHNIQUE: Multidetector CT imaging of the abdomen and pelvis was performed following the standard protocol without IV contrast. COMPARISON:  07/11/2020 FINDINGS: Lower chest: Lung bases are clear.  Small esophageal hiatal hernia. Hepatobiliary: No focal liver abnormality is seen. Status post cholecystectomy. No biliary dilatation. Pancreas: Unremarkable. No pancreatic ductal dilatation or surrounding inflammatory changes. Spleen: Normal in size without focal abnormality. Adrenals/Urinary Tract: No adrenal gland nodules. Multiple intrarenal stones in both kidneys. Enlargement of the left kidney with pararenal and Peri ureteral stranding. Left hydronephrosis and hydroureter. 5 mm stone  in the left posterior bladder may represent a ureterovesical stone or a recently passed stone. No bladder wall thickening. Stomach/Bowel: Stomach is within normal limits. Appendix is not identified. No evidence of bowel wall thickening, distention, or inflammatory changes. Vascular/Lymphatic: Calcification of the aorta. No aortic aneurysm. 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 No significant lymphadenopathy. Reproductive: Status post hysterectomy. No adnexal masses. Other: No abdominal wall hernia or abnormality. No abdominopelvic ascites.  Musculoskeletal: Degenerative changes in the spine. Mild lumbar scoliosis convex towards the left. No destructive bone lesions. IMPRESSION: 1. 5 mm stone in the left posterior bladder may represent a ureterovesical stone or a recently passed stone. Moderate proximal obstruction of the left kidney and ureter. 2. Multiple nonobstructing intrarenal stones in both kidneys. 3. Small esophageal hiatal hernia. 4. 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 5. Aortic atherosclerosis. 6. Degenerative changes in the spine. Aortic Atherosclerosis (ICD10-I70.0). Electronically Signed   By: Lucienne Capers M.D.   On: 08/10/2020 02:17    Procedures Procedures (including critical care time)  Medications Ordered in ED Medications  morphine 4 MG/ML injection 4 mg (4 mg Intravenous Given 08/10/20 0124)  ondansetron (ZOFRAN) injection 4 mg (4 mg Intravenous Given 08/10/20 0124)  morphine 4 MG/ML injection 4 mg (4 mg Intravenous Given 08/10/20 0415)    ED Course  I have reviewed the triage vital signs and the nursing notes.  Pertinent labs & imaging results that were available during my care of the patient were reviewed by me and considered in my medical decision making (see chart for details).    MDM Rules/Calculators/A&P                          Patient presents with left flank pain.  Her symptoms sound consistent with her prior episodes of kidney stones.  She had a CT scan which shows 5 mm stone in the bladder which is likely a recently passed stone.  There is associated left-sided hydronephrosis but no ureteral stones are identified.  Her creatinine is mildly elevated.  Her urine does not appear to be consistent with infection.  She is afebrile.  Her pain is well controlled in the ED.  She was discharged home in good condition.  She was advised to follow-up with her PCP to have her creatinine rechecked.  Her glucose was also elevated and can be rechecked by her PCP.  If she has ongoing  flank pain, I advised her to follow-up with her urologist but otherwise she could follow-up with her PCP given it appears that the stone has already passed.  If she has any worsening symptoms, she was instructed to return to the emergency room. Final Clinical Impression(s) / ED Diagnoses Final diagnoses:  Kidney stone  Elevated serum creatinine    Rx / DC Orders ED Discharge Orders         Ordered    HYDROcodone-acetaminophen (NORCO/VICODIN) 5-325 MG tablet  Every 4 hours PRN        08/10/20 8768           Malvin Johns, MD 08/10/20 1157

## 2020-08-10 NOTE — Discharge Instructions (Addendum)
Your creatinine was elevated and needs to be rechecked by your primary care doctor.  If your pain continues, you should follow-up with the urology clinic.  Return to the emergency room if you have any worsening pain, vomiting, fevers or other worsening symptoms.

## 2020-08-10 NOTE — ED Triage Notes (Signed)
Patient's husband called EMS d/t severe L lower back (flank) pain radiating to LL abdomen. Per EMS patient stated that she has hx of kidney stones and "it feels like a kidney stone".   Per EMS, 4mg  IV zofran and 250 mcg IV fentanyl without relief. Patient is currently nausea/vomiting and in severe pain.

## 2020-08-10 NOTE — ED Notes (Signed)
Family at bedside. 

## 2020-08-14 ENCOUNTER — Telehealth: Payer: Self-pay | Admitting: Cardiology

## 2020-08-14 NOTE — Telephone Encounter (Signed)
   [facility] Pre-operative Risk Assessment    HEARTCARE STAFF: - Please ensure there is not already an duplicate clearance open for this procedure. - Under Visit Info/Reason for Call, type in Other and utilize the format Clearance MM/DD/YY or Clearance TBD. Do not use dashes or single digits. - If request is for dental extraction, please clarify the # of teeth to be extracted.  Request for surgical clearance:  1. What type of surgery is being performed? Shockwave lithotripsy   2. When is this surgery scheduled? TBD  3. What type of clearance is required (medical clearance vs. Pharmacy clearance to hold med vs. Both)? Both  4. Are there any medications that need to be held prior to surgery and how long? hold Aspirin 5 days prior   5. Practice name and name of physician performing surgery? [facility], Dr. Harrell Gave Lovena Robertson  6. What is the office phone number? [phone]   7.   What is the office fax number? [phone]  8.   Anesthesia type (None, local, MAC, general) ? local   Brittany Robertson 08/14/2020, 8:42 AM  _________________________________________________________________   (provider comments below)

## 2020-08-14 NOTE — Telephone Encounter (Signed)
     Primary Cardiologist: Fransico Him, MD  Chart reviewed as part of pre-operative protocol coverage. Patient was last seen 02/01/20 and was stable from a cardiac perspective. Since then she denies any chest pain, shortness of breath, palpitations, lower leg edema, or orthopnea.  Patient can complete >4 METS however joint pain greatly limits her function. Given past medical history and time since last visit, based on ACC/AHA guidelines, Brittany Robertson would be at acceptable risk for the planned procedure without further cardiovascular testing.   Regarding ASA therapy, we recommend continuation of ASA throughout the perioperative period.  However, if the surgeon feels that cessation of ASA is required in the perioperative period, it may be stopped 5-7 days prior to surgery with a plan to resume it as soon as felt to be feasible from a surgical standpoint in the post-operative period.  The patient was advised that if she develops new symptoms prior to surgery to contact our office to arrange for a follow-up visit, and she verbalized understanding.  I will route this recommendation to the requesting party via Epic fax function and remove from pre-op pool.  Please call with questions.  Brittany Real Ninfa Meeker, PA-C 08/14/2020, 8:53 AM

## 2020-09-09 ENCOUNTER — Other Ambulatory Visit: Payer: Self-pay | Admitting: Urology

## 2020-09-10 NOTE — Patient Instructions (Addendum)
DUE TO COVID-19 ONLY ONE VISITOR IS ALLOWED TO COME WITH YOU AND STAY IN THE WAITING ROOM ONLY DURING PRE OP AND PROCEDURE DAY OF SURGERY. THE 1 VISITOR  MAY VISIT WITH YOU AFTER SURGERY IN YOUR PRIVATE ROOM DURING VISITING HOURS ONLY!  YOU NEED TO HAVE A COVID 19 TEST ON_11/26______ @_9 :45______, THIS TEST MUST BE DONE BEFORE SURGERY,  COVID TESTING SITE [place] [id number], IT IS ON THE RIGHT GOING OUT [address] APPROXIMATELY  2 MINUTES PAST [employer] ON THE RIGHT. ONCE YOUR COVID TEST IS COMPLETED,  PLEASE BEGIN THE QUARANTINE INSTRUCTIONS AS OUTLINED IN YOUR HANDOUT.                AQUILLA SHAMBLEY    Your procedure is scheduled on: 09/17/20   Report to [facility] Main  Entrance   Report to admitting at  12:30 PM     Call this number if you have problems the morning of surgery [phone]    Remember: Do not eat food After Midnight.   You may have clear liquids until 8:00 am.  CLEAR LIQUID DIET   Foods Allowed                                                                     Foods Excluded  Coffee and tea, regular and decaf                             liquids that you cannot  Plain Jell-O any favor except red or purple                                           see through such as: Fruit ices (not with fruit pulp)                                     milk, soups, orange juice  Iced Popsicles                                    All solid food Carbonated beverages, regular and diet                                    Cranberry, grape and apple juices Sports drinks like Gatorade Lightly seasoned clear broth or consume(fat free) Sugar, honey syrup      BRUSH YOUR TEETH MORNING OF SURGERY AND RINSE YOUR MOUTH OUT, NO CHEWING GUM CANDY OR MINTS.     Take these medicines the morning of surgery with A SIP OF WATER: Metoprolol, Prilosec, Oxybutynin                                 You may not have any metal on your body  including hair pins and  piercings  Do not wear jewelry, make-up, lotions, powders or perfumes, deodorant             Do not wear nail polish on your fingernails.  Do not shave  48 hours prior to surgery.             Do not bring valuables to the hospital. [place].  Contacts, dentures or bridgework may not be worn into surgery.  Leave suitcase in the car. After surgery it may be brought to your room.     Patients discharged the day of surgery will not be allowed to drive home.   IF YOU ARE HAVING SURGERY AND GOING HOME THE SAME DAY, YOU MUST HAVE AN ADULT TO DRIVE YOU HOME AND BE WITH YOU FOR 24 HOURS.   YOU MAY GO HOME BY TAXI OR UBER OR ORTHERWISE, BUT AN ADULT MUST ACCOMPANY YOU HOME AND STAY WITH YOU FOR 24 HOURS.  Name and phone number of your driver:  Special Instructions: N/A              Please read over the following fact sheets you were given: _____________________________________________________________________             [facility] - Preparing for Surgery Before surgery, you can play an important role.   Because skin is not sterile, your skin needs to be as free of germs as possible .  You can reduce the number of germs on your skin by washing with CHG (chlorahexidine gluconate) soap before surgery.   CHG is an antiseptic cleaner which kills germs and bonds with the skin to continue killing germs even after washing. Please DO NOT use if you have an allergy to CHG or antibacterial soaps.   If your skin becomes reddened/irritated stop using the CHG and inform your nurse when you arrive at Short Stay. Do not shave (including legs and underarms) for at least 48 hours prior to the first CHG shower.  Please follow these instructions carefully:   1.  Shower with CHG Soap the night before surgery and the  morning of Surgery.  2.  If you choose to wash your hair, wash your hair first as usual with your  normal   shampoo.  3.  After you shampoo, rinse your hair and body thoroughly to remove the  shampoo.                                        4.  Use CHG as you would any other liquid soap.  You can apply chg directly  to the skin and wash                       Gently with a scrungie or clean washcloth.  5.  Apply the CHG Soap to your body ONLY FROM THE NECK DOWN.   Do not use on face/ open                           Wound or open sores. Avoid contact with eyes, ears mouth and genitals (private parts).                       Wash face,  Genitals (  private parts) with your normal soap.             6.  Wash thoroughly, paying special attention to the area where your surgery  will be performed.  7.  Thoroughly rinse your body with warm water from the neck down.  8.  DO NOT shower/wash with your normal soap after using and rinsing off  the CHG Soap.             9.  Pat yourself dry with a clean towel.            10.  Wear clean pajamas.            11.  Place clean sheets on your bed the night of your first shower and do not  sleep with pets. Day of Surgery : Do not apply any lotions/deodorants the morning of surgery.  Please wear clean clothes to the hospital/surgery center.  FAILURE TO FOLLOW THESE INSTRUCTIONS MAY RESULT IN THE CANCELLATION OF YOUR SURGERY PATIENT SIGNATURE_________________________________  NURSE SIGNATURE__________________________________  ________________________________________________________________________

## 2020-09-11 ENCOUNTER — Other Ambulatory Visit: Payer: Self-pay

## 2020-09-11 ENCOUNTER — Encounter (HOSPITAL_COMMUNITY)
Admission: RE | Admit: 2020-09-11 | Discharge: 2020-09-11 | Disposition: A | Discharge status: Home / Self Care | Payer: Medicare Other | Source: Ambulatory Visit | Attending: Urology | Admitting: Urology

## 2020-09-11 ENCOUNTER — Encounter (HOSPITAL_COMMUNITY): Payer: Self-pay

## 2020-09-11 DIAGNOSIS — Z96653 Presence of artificial knee joint, bilateral: Secondary | ICD-10-CM | POA: Insufficient documentation

## 2020-09-11 DIAGNOSIS — I2510 Atherosclerotic heart disease of native coronary artery without angina pectoris: Secondary | ICD-10-CM | POA: Diagnosis not present

## 2020-09-11 DIAGNOSIS — Z955 Presence of coronary angioplasty implant and graft: Secondary | ICD-10-CM | POA: Diagnosis not present

## 2020-09-11 DIAGNOSIS — Z87442 Personal history of urinary calculi: Secondary | ICD-10-CM | POA: Diagnosis not present

## 2020-09-11 DIAGNOSIS — I10 Essential (primary) hypertension: Secondary | ICD-10-CM | POA: Diagnosis not present

## 2020-09-11 DIAGNOSIS — N200 Calculus of kidney: Secondary | ICD-10-CM | POA: Diagnosis not present

## 2020-09-11 DIAGNOSIS — K219 Gastro-esophageal reflux disease without esophagitis: Secondary | ICD-10-CM | POA: Insufficient documentation

## 2020-09-11 DIAGNOSIS — Z01812 Encounter for preprocedural laboratory examination: Secondary | ICD-10-CM | POA: Insufficient documentation

## 2020-09-11 DIAGNOSIS — Z79899 Other long term (current) drug therapy: Secondary | ICD-10-CM | POA: Insufficient documentation

## 2020-09-11 DIAGNOSIS — Z791 Long term (current) use of non-steroidal anti-inflammatories (NSAID): Secondary | ICD-10-CM | POA: Insufficient documentation

## 2020-09-11 DIAGNOSIS — Z7982 Long term (current) use of aspirin: Secondary | ICD-10-CM | POA: Diagnosis not present

## 2020-09-11 HISTORY — DX: Cardiac arrhythmia, unspecified: I49.9

## 2020-09-11 LAB — CBC
HCT: 42.3 % (ref 36.0–46.0)
Hemoglobin: 13.8 g/dL (ref 12.0–15.0)
MCH: 32.4 pg (ref 26.0–34.0)
MCHC: 32.6 g/dL (ref 30.0–36.0)
MCV: 99.3 fL (ref 80.0–100.0)
Platelets: 192 10*3/uL (ref 150–400)
RBC: 4.26 MIL/uL (ref 3.87–5.11)
RDW: 13.7 % (ref 11.5–15.5)
WBC: 6.3 10*3/uL (ref 4.0–10.5)
nRBC: 0 % (ref 0.0–0.2)

## 2020-09-11 LAB — BASIC METABOLIC PANEL
Anion gap: 10 (ref 5–15)
BUN: 22 mg/dL (ref 8–23)
CO2: 24 mmol/L (ref 22–32)
Calcium: 9.2 mg/dL (ref 8.9–10.3)
Chloride: 106 mmol/L (ref 98–111)
Creatinine, Ser: 0.75 mg/dL (ref 0.44–1.00)
GFR, Estimated: >60 mL/min (ref >60)
Glucose, Bld: 119 mg/dL — ABNORMAL HIGH (ref 70–99)
Potassium: 4.3 mmol/L (ref 3.5–5.1)
Sodium: 140 mmol/L (ref 135–145)

## 2020-09-11 NOTE — Progress Notes (Signed)
COVID Vaccine Completed:Yes Date COVID Vaccine completed:07/16/20 COVID vaccine manufacturer: Pfizer     PCP - Dr. Raliegh Ip. [facility] Cardiologist - Dr. Ashok Norris  Chest x-ray - 12/28/19-Epic EKG - 08/10/20-Epic Stress Test - 2017-Epic ECHO - 08/09/18- Epic Cardiac Cath - 2014,2017 Pacemaker/ICD device last checked:NA  Sleep Study -no  CPAP -   Fasting Blood Sugar - NA Checks Blood Sugar _____ times a day  Blood Thinner Instructions:ASA/Dr. Radford Pax Aspirin Instructions:stop 3 days prior to DOS / Dr. [name] Last Dose:  Anesthesia review:   Patient denies shortness of breath, fever, cough and chest pain at PAT appointment yes   Patient verbalized understanding of instructions that were given to them at the PAT appointment. Patient was also instructed that they will need to review over the PAT instructions again at home before surgery. Yes Pt doesn't climb stairs. She uses a cane or a walker around the house. She has no SOb with ADLs

## 2020-09-13 ENCOUNTER — Other Ambulatory Visit (HOSPITAL_COMMUNITY)
Admission: RE | Admit: 2020-09-13 | Discharge: 2020-09-13 | Disposition: A | Discharge status: Home / Self Care | Payer: Medicare Other | Source: Ambulatory Visit | Attending: Urology | Admitting: Urology

## 2020-09-13 DIAGNOSIS — Z01812 Encounter for preprocedural laboratory examination: Secondary | ICD-10-CM | POA: Diagnosis present

## 2020-09-13 DIAGNOSIS — Z20822 Contact with and (suspected) exposure to covid-19: Secondary | ICD-10-CM | POA: Diagnosis not present

## 2020-09-13 LAB — SARS CORONAVIRUS 2 (TAT 6-24 HRS): SARS Coronavirus 2: NEGATIVE

## 2020-09-16 MED ORDER — GENTAMICIN SULFATE 40 MG/ML IJ SOLN
5.0000 mg/kg | INTRAVENOUS | Status: AC
  Administered 2020-09-17: 370 mg via INTRAVENOUS
  Filled 2020-09-16: qty 9.25

## 2020-09-16 NOTE — Anesthesia Preprocedure Evaluation (Addendum)
Anesthesia Evaluation  Patient identified by MRN, date of birth, ID band Patient awake    Reviewed: Allergy & Precautions, NPO status , Patient's Chart, lab work & pertinent test results  History of Anesthesia Complications (+) PONV and history of anesthetic complications  Airway Mallampati: I  TM Distance: >3 FB Neck ROM: Full    Dental no notable dental hx. (+) Teeth Intact, Caps, Dental Advisory Given   Pulmonary neg pulmonary ROS,    Pulmonary exam normal breath sounds clear to auscultation       Cardiovascular hypertension, + CAD and +CHF  Normal cardiovascular exam+ dysrhythmias  Rhythm:Regular Rate:Normal     Neuro/Psych negative neurological ROS  negative psych ROS   GI/Hepatic Neg liver ROS, GERD  ,  Endo/Other  negative endocrine ROS  Renal/GU negative Renal ROS  negative genitourinary   Musculoskeletal  (+) Arthritis ,   Abdominal   Peds negative pediatric ROS (+)  Hematology negative hematology ROS (+)   Anesthesia Other Findings   Reproductive/Obstetrics negative OB ROS                           Anesthesia Physical Anesthesia Plan  ASA: III  Anesthesia Plan: General   Post-op Pain Management:    Induction: Intravenous  PONV Risk Score and Plan: 3 and Dexamethasone, Treatment may vary due to age or medical condition and Ondansetron  Airway Management Planned: Oral ETT and LMA  Additional Equipment:   Intra-op Plan:   Post-operative Plan: Extubation in OR  Informed Consent:     Dental advisory given  Plan Discussed with: Anesthesiologist and CRNA  Anesthesia Plan Comments: (See PAT note 09/11/2020, Konrad Felix, PA-C  79 y.o. never smoker with h/o HTN, PONV, GERD, CAD (s/p PCI of LAD 2006, repeat cath 2014 with patent LAD stent), AAA (2D echo showed 3.5 x 4.2cm), CHF, bilateral nephrolithiasis scheduled for above procedure 09/17/20 with Dr. Harrell Gave  [name].   Per cardiology preoperative risk assessment 08/14/2020, "Chart reviewed as part of pre-operative protocol coverage.Patient was last seen 02/01/20 and was stable from a cardiac perspective. Since then she denies any chest pain, shortness of breath, palpitations, lower leg edema, or orthopnea. Patient can complete >4 METS however joint pain greatly limits her function. Given past medical history and time since last visit, based on ACC/AHA guidelines,Juneau T Byerswould be at acceptable risk for the planned procedure without further cardiovascular testing.  Regarding ASA therapy, we recommend continuation of ASA throughout the perioperative period. However, if the surgeon feels that cessation of ASA is required in the perioperative period, it may be stopped 5-7 days prior to surgery with a plan to resume it as soon as felt to be feasible from a surgical standpoint in the post-operative period."  Anticipate pt can proceed with planned procedure barring acute status change.   VS: BP (!) 154/85   Pulse 75   Temp 36.7 C (Oral)   Resp 20   Ht 5\' 4"  (1.626 m)   Wt 104.3 kg   SpO2 99%   BMI 39.48 kg/m   EKG: 08/10/20 Rate 66 bpm  Sinus rhythm  Abnormal R-wave progression, early transition  Since last tracing no significant change   CV: Echo 08/09/2018 Study Conclusions  Left ventricle: The cavity size was normal. Wall thickness was  increased in a pattern of mild LVH. Systolic function was normal.  The estimated ejection fraction was in the range of 60% to 65%.  Wall motion was  normal; there were no regional wall motion  abnormalities. Features are consistent with a pseudonormal left  ventricular filling pattern, with concomitant abnormal relaxation  and increased filling pressure (grade 2 diastolic dysfunction).   Aortic valve: There was trivial regurgitation.   Cardiac Cath 08/31/2016 1. Continued patency of the stented segment in the LAD 2. Patent  coronary arteries with minimal irregularity 3. Normal LV systolic function with normal LVEDP 4. Normal right heart pressures    PCI 2006, repeat cath 30% mid LAD distal to stent otherwise normal coronary arteries, cath 08/2013 with patent LAD stent and otherwise normal coronary arteries with diastolic dysfunction. Cath 08/2016 with normal RHC, normal LVEDP, patent LAD stent with otherwise normal coronary arteries  )     Anesthesia Quick Evaluation

## 2020-09-16 NOTE — Progress Notes (Signed)
Anesthesia Chart Review   Case: [id number] Date/Time: 09/17/20 1415   Procedure: CYSTOSCOPY/URETEROSCOPY/HOLMIUM LASER/STENT PLACEMENT (Bilateral )   Anesthesia type: Choice   Pre-op diagnosis: BILATERAL NEPHROLITHIASIS   Location: [facility]   Surgeons: Ceasar Mons, MD      DISCUSSION:79 y.o. never smoker with h/o HTN, PONV, GERD, CAD (s/p PCI of LAD 2006, repeat cath 2014 with patent LAD stent), AAA (2D echo showed 3.5 x 4.2cm), CHF, bilateral nephrolithiasis scheduled for above procedure 09/17/20 with Dr. Harrell Gave [name].   Per cardiology preoperative risk assessment 08/14/2020, "Chart reviewed as part of pre-operative protocol coverage. Patient was last seen 02/01/20 and was stable from a cardiac perspective. Since then she denies any chest pain, shortness of breath, palpitations, lower leg edema, or orthopnea.  Patient can complete >4 METS however joint pain greatly limits her function. Given past medical history and time since last visit, based on ACC/AHA guidelines, ZAELYN NOACK would be at acceptable risk for the planned procedure without further cardiovascular testing.  Regarding ASA therapy, we recommend continuation of ASA throughout the perioperative period.  However, if the surgeon feels that cessation of ASA is required in the perioperative period, it may be stopped 5-7 days prior to surgery with a plan to resume it as soon as felt to be feasible from a surgical standpoint in the post-operative period."  Anticipate pt can proceed with planned procedure barring acute status change.   VS: BP (!) 154/85   Pulse 75   Temp 36.7 C (Oral)   Resp 20   Ht 5\' 4"  (1.626 m)   Wt 104.3 kg   SpO2 99%   BMI 39.48 kg/m   PROVIDERS: Mayra Neer, MD is PCP   Fransico Him, MD is Cardiologist  LABS: Labs reviewed: Acceptable for surgery. (all labs ordered are listed, but only abnormal results are displayed)  Labs Reviewed  BASIC METABOLIC PANEL -  Abnormal; Notable for the following components:      Result Value   Glucose, Bld 119 (*)    All other components within normal limits  CBC     IMAGES:   EKG: 08/10/20 Rate 66 bpm  Sinus rhythm  Abnormal R-wave progression, early transition  Since last tracing no significant change   CV: Echo 08/09/2018 Study Conclusions   - Left ventricle: The cavity size was normal. Wall thickness was  increased in a pattern of mild LVH. Systolic function was normal.  The estimated ejection fraction was in the range of 60% to 65%.  Wall motion was normal; there were no regional wall motion  abnormalities. Features are consistent with a pseudonormal left  ventricular filling pattern, with concomitant abnormal relaxation  and increased filling pressure (grade 2 diastolic dysfunction).  - Aortic valve: There was trivial regurgitation.   Cardiac Cath 08/31/2016 1. Continued patency of the stented segment in the LAD 2. Patent coronary arteries with minimal irregularity 3. Normal LV systolic function with normal LVEDP 4. Normal right heart pressures  Suspect noncardiac symptoms  Past Medical History:  Diagnosis Date  . Ascending aortic aneurysm ([place])    4mm 07/2018  . Bruises easily   . Chronic diastolic CHF (congestive heart failure) ([place])   . Complication of anesthesia    "takes a long time to wake up"bp little low when wakes up  . Coronary artery disease    PCI 2006, repeat cath 30% mid LAD distal to stent otherwise normal coronary arteries, cath 08/2013 with patent LAD stent and  otherwise normal coronary arteries with diastolic dysfunction. Cath 08/2016 with normal RHC, normal LVEDP, patent LAD stent with otherwise normal coronary arteries  . DDD (degenerative disc disease), cervical   . Dilated aortic root ([place])    58mm by echo 07/2017  . Dysrhythmia   . GERD (gastroesophageal reflux disease)   . History of kidney stones   . Hyperlipidemia   . Hypertension   .  Left ureteral stone   . OA (osteoarthritis)   . Obesity   . Osteopenia   . PONV (postoperative nausea and vomiting)    Motion sickness when move out of PACU to room  . PVC's (premature ventricular contractions)   . SOB (shortness of breath) 07/13/2016   non cardiac and secondary to deconditioning and obesity    Past Surgical History:  Procedure Laterality Date  . ABDOMINAL HYSTERECTOMY    . APPENDECTOMY  1957  . arthroscopic knee  05/2002,04/07/2005   right and then also torn muscle surgery on right knee (2003(  . arthroscopic knee  01/02/2005,09/21/2006   left  . CARDIAC CATHETERIZATION N/A 08/31/2016   Procedure: Right/Left Heart Cath and Coronary Angiography;  Surgeon: Sherren Mocha, MD;  Location: [facility];  Service: Cardiovascular;  Laterality: N/A;  . CHOLECYSTECTOMY  1988  . COLONOSCOPY  2010   with Endo  . CORONARY ANGIOPLASTY  02/05/2004  . CYST REMOVAL HAND  2000  . CYSTOSCOPY/URETEROSCOPY/HOLMIUM LASER/STENT PLACEMENT Left 08/26/2018   Procedure: CYSTOSCOPY/RETROGRADE/URETEROSCOPY/HOLMIUM LASER/ STONE BASKETRY/ STENT PLACEMENT;  Surgeon: Ceasar Mons, MD;  Location: [facility];  Service: Urology;  Laterality: Left;  ONLY NEEDS  45 MIN  . [place] OF UTERUS  1972  . EXTRACORPOREAL SHOCK WAVE LITHOTRIPSY Right 12/12/2018   Procedure: EXTRACORPOREAL SHOCK WAVE LITHOTRIPSY (ESWL);  Surgeon: Ceasar Mons, MD;  Location: [facility];  Service: Urology;  Laterality: Right;  . EYE SURGERY  01/2007   bilateral cataract with lens implant  . FOOT SURGERY  01/2012   left and right bunion and hammer toe  . HAND SURGERY  05/29/2011   joint removed  . [place]  . HERNIA REPAIR  02/2010   incisional hernioa  . KNEE ARTHROSCOPY    . LEFT HEART CATHETERIZATION WITH CORONARY ANGIOGRAM N/A 09/19/2013   Procedure: LEFT HEART CATHETERIZATION WITH CORONARY ANGIOGRAM;  Surgeon: Sueanne Margarita, MD;  Location: [facility];  Service: Cardiovascular;  Laterality: N/A;  . neck injections  03/2014   back and neck injections from Dr. Nelva Bush  . stents     x1  . tear duct surgery  1990's   bilateral  . TONSILLECTOMY     as child  . TOTAL KNEE ARTHROPLASTY Left 01/04/2015   Procedure: LEFT TOTAL KNEE ARTHROPLASTY;  Surgeon: Sydnee Cabal, MD;  Location: [facility];  Service: Orthopedics;  Laterality: Left;  . TOTAL KNEE ARTHROPLASTY Right 05/10/2015   Procedure: TOTAL RIGHT KNEE ARTHROPLASTY;  Surgeon: Sydnee Cabal, MD;  Location: [facility];  Service: Orthopedics;  Laterality: Right;    MEDICATIONS: . acetaminophen (TYLENOL) 500 MG tablet  . aspirin 81 MG tablet  . Calcium Carb-Cholecalciferol (CALCIUM 500/D) 500-400 MG-UNIT CHEW  . cetirizine (ZYRTEC) 10 MG tablet  . cholecalciferol (VITAMIN D) 400 UNITS TABS  . Coenzyme Q10 (COQ-10) 100 MG CAPS  . denosumab (PROLIA) 60 MG/ML SOLN injection  . docusate sodium (COLACE) 100 MG capsule  . ezetimibe (ZETIA) 10 MG tablet  . fish oil-omega-3 fatty acids 1000 MG capsule  .  furosemide (LASIX) 20 MG tablet  . Glucosamine 500 MG CAPS  . HYDROcodone-acetaminophen (NORCO/VICODIN) 5-325 MG tablet  . Melatonin 5 MG CAPS  . meloxicam (MOBIC) 15 MG tablet  . methocarbamol (ROBAXIN) 500 MG tablet  . metoprolol succinate (TOPROL-XL) 25 MG 24 hr tablet  . nitroGLYCERIN (NITROSTAT) 0.4 MG SL tablet  . olmesartan (BENICAR) 40 MG tablet  . omeprazole (PRILOSEC) 40 MG capsule  . oxybutynin (DITROPAN) 5 MG tablet  . potassium chloride SA (K-DUR,KLOR-CON) 20 MEQ tablet  . rosuvastatin (CRESTOR) 20 MG tablet  . sodium chloride (OCEAN) 0.65 % SOLN nasal spray  . triamcinolone cream (KENALOG) 0.1 %  . vitamin C (ASCORBIC ACID) 500 MG tablet  . vitamin E 180 MG (400 UNITS) capsule   No current facility-administered medications for this encounter.   Derrill Memo ON 09/17/2020] gentamicin (GARAMYCIN) 370 mg in dextrose 5 % 100 mL IVPB     Konrad Felix, PA-C [facility]  Pre-Surgical Testing [phone]

## 2020-09-17 ENCOUNTER — Other Ambulatory Visit: Payer: Self-pay

## 2020-09-17 ENCOUNTER — Encounter (HOSPITAL_COMMUNITY): Payer: Self-pay | Admitting: Urology

## 2020-09-17 ENCOUNTER — Ambulatory Visit (HOSPITAL_COMMUNITY): Payer: Medicare Other | Admitting: Certified Registered"

## 2020-09-17 ENCOUNTER — Ambulatory Visit (HOSPITAL_COMMUNITY): Payer: Medicare Other

## 2020-09-17 ENCOUNTER — Encounter (HOSPITAL_COMMUNITY)
Admission: RE | Disposition: A | Discharge status: Home / Self Care | Payer: Self-pay | Source: Home / Self Care | Attending: Urology

## 2020-09-17 ENCOUNTER — Ambulatory Visit (HOSPITAL_COMMUNITY)
Admission: RE | Admit: 2020-09-17 | Discharge: 2020-09-17 | Disposition: A | Discharge status: Home / Self Care | Payer: Medicare Other | Attending: Urology | Admitting: Urology

## 2020-09-17 DIAGNOSIS — Z882 Allergy status to sulfonamides status: Secondary | ICD-10-CM | POA: Insufficient documentation

## 2020-09-17 DIAGNOSIS — Z880 Allergy status to penicillin: Secondary | ICD-10-CM | POA: Diagnosis not present

## 2020-09-17 DIAGNOSIS — I2510 Atherosclerotic heart disease of native coronary artery without angina pectoris: Secondary | ICD-10-CM | POA: Diagnosis not present

## 2020-09-17 DIAGNOSIS — Z87442 Personal history of urinary calculi: Secondary | ICD-10-CM | POA: Insufficient documentation

## 2020-09-17 DIAGNOSIS — I110 Hypertensive heart disease with heart failure: Secondary | ICD-10-CM | POA: Insufficient documentation

## 2020-09-17 DIAGNOSIS — I509 Heart failure, unspecified: Secondary | ICD-10-CM | POA: Insufficient documentation

## 2020-09-17 DIAGNOSIS — Z79899 Other long term (current) drug therapy: Secondary | ICD-10-CM | POA: Insufficient documentation

## 2020-09-17 DIAGNOSIS — N132 Hydronephrosis with renal and ureteral calculous obstruction: Secondary | ICD-10-CM | POA: Insufficient documentation

## 2020-09-17 DIAGNOSIS — Z91048 Other nonmedicinal substance allergy status: Secondary | ICD-10-CM | POA: Diagnosis not present

## 2020-09-17 DIAGNOSIS — K219 Gastro-esophageal reflux disease without esophagitis: Secondary | ICD-10-CM | POA: Insufficient documentation

## 2020-09-17 HISTORY — PX: CYSTOSCOPY/URETEROSCOPY/HOLMIUM LASER/STENT PLACEMENT: SHX6546

## 2020-09-17 SURGERY — CYSTOSCOPY/URETEROSCOPY/HOLMIUM LASER/STENT PLACEMENT
Anesthesia: General | Laterality: Bilateral

## 2020-09-17 MED ORDER — CIPROFLOXACIN HCL 500 MG PO TABS: 500.0000 mg | ORAL_TABLET | Freq: Two times a day (BID) | ORAL | 0 refills | Status: AC

## 2020-09-17 MED ORDER — FENTANYL CITRATE (PF) 100 MCG/2ML IJ SOLN
INTRAMUSCULAR | Status: AC
  Filled 2020-09-17: qty 2

## 2020-09-17 MED ORDER — ORAL CARE MOUTH RINSE: 15.0000 mL | Freq: Once | OROMUCOSAL | Status: AC

## 2020-09-17 MED ORDER — ONDANSETRON HCL 4 MG/2ML IJ SOLN
INTRAMUSCULAR | Status: DC | PRN
  Administered 2020-09-17: 4 mg via INTRAVENOUS

## 2020-09-17 MED ORDER — OXYCODONE HCL 5 MG PO TABS: 5.0000 mg | ORAL_TABLET | Freq: Once | ORAL | Status: DC | PRN

## 2020-09-17 MED ORDER — CHLORHEXIDINE GLUCONATE 0.12 % MT SOLN
15.0000 mL | Freq: Once | OROMUCOSAL | Status: AC
  Administered 2020-09-17: 15 mL via OROMUCOSAL

## 2020-09-17 MED ORDER — FENTANYL CITRATE (PF) 100 MCG/2ML IJ SOLN
INTRAMUSCULAR | Status: DC | PRN
  Administered 2020-09-17 (×2): 50 ug via INTRAVENOUS

## 2020-09-17 MED ORDER — PHENAZOPYRIDINE HCL 200 MG PO TABS: 200.0000 mg | ORAL_TABLET | Freq: Three times a day (TID) | ORAL | 0 refills | Status: DC | PRN

## 2020-09-17 MED ORDER — OXYCODONE HCL 5 MG/5ML PO SOLN: 5.0000 mg | Freq: Once | ORAL | Status: DC | PRN

## 2020-09-17 MED ORDER — ACETAMINOPHEN 160 MG/5ML PO SOLN: 325.0000 mg | ORAL | Status: DC | PRN

## 2020-09-17 MED ORDER — SODIUM CHLORIDE 0.9 % IR SOLN
Status: DC | PRN
  Administered 2020-09-17: 6000 mL

## 2020-09-17 MED ORDER — PROPOFOL 10 MG/ML IV BOLUS
INTRAVENOUS | Status: DC | PRN
  Administered 2020-09-17: 150 mg via INTRAVENOUS

## 2020-09-17 MED ORDER — DEXAMETHASONE SODIUM PHOSPHATE 10 MG/ML IJ SOLN
INTRAMUSCULAR | Status: DC | PRN
  Administered 2020-09-17: 4 mg via INTRAVENOUS

## 2020-09-17 MED ORDER — 0.9 % SODIUM CHLORIDE (POUR BTL) OPTIME
TOPICAL | Status: DC | PRN
  Administered 2020-09-17: 1000 mL

## 2020-09-17 MED ORDER — PROPOFOL 10 MG/ML IV BOLUS
INTRAVENOUS | Status: AC
  Filled 2020-09-17: qty 20

## 2020-09-17 MED ORDER — FENTANYL CITRATE (PF) 100 MCG/2ML IJ SOLN: 25.0000 ug | INTRAMUSCULAR | Status: DC | PRN

## 2020-09-17 MED ORDER — ONDANSETRON HCL 4 MG/2ML IJ SOLN
INTRAMUSCULAR | Status: AC
  Filled 2020-09-17: qty 2

## 2020-09-17 MED ORDER — MEPERIDINE HCL 50 MG/ML IJ SOLN: 6.2500 mg | INTRAMUSCULAR | Status: DC | PRN

## 2020-09-17 MED ORDER — ACETAMINOPHEN 325 MG PO TABS: 325.0000 mg | ORAL_TABLET | ORAL | Status: DC | PRN

## 2020-09-17 MED ORDER — LACTATED RINGERS IV SOLN: INTRAVENOUS | Status: DC

## 2020-09-17 MED ORDER — EPHEDRINE SULFATE-NACL 50-0.9 MG/10ML-% IV SOSY
PREFILLED_SYRINGE | INTRAVENOUS | Status: DC | PRN
  Administered 2020-09-17: 10 mg via INTRAVENOUS

## 2020-09-17 MED ORDER — ONDANSETRON HCL 4 MG/2ML IJ SOLN: 4.0000 mg | Freq: Once | INTRAMUSCULAR | Status: DC | PRN

## 2020-09-17 MED ORDER — LIDOCAINE 2% (20 MG/ML) 5 ML SYRINGE
INTRAMUSCULAR | Status: DC | PRN
  Administered 2020-09-17: 60 mg via INTRAVENOUS

## 2020-09-17 MED ORDER — HYDROCODONE-ACETAMINOPHEN 5-325 MG PO TABS
1.0000 | ORAL_TABLET | ORAL | 0 refills | Status: DC | PRN
Start: 2020-09-17 — End: 2021-01-02

## 2020-09-17 MED ORDER — DEXAMETHASONE SODIUM PHOSPHATE 10 MG/ML IJ SOLN
INTRAMUSCULAR | Status: AC
  Filled 2020-09-17: qty 1

## 2020-09-17 MED ORDER — IOHEXOL 300 MG/ML  SOLN
INTRAMUSCULAR | Status: DC | PRN
  Administered 2020-09-17: 23 mL

## 2020-09-17 SURGICAL SUPPLY — 22 items
BAG URO CATCHER STRL LF (MISCELLANEOUS) ×3 IMPLANT
BASKET ZERO TIP NITINOL 2.4FR (BASKET) IMPLANT
BSKT STON RTRVL ZERO TP 2.4FR (BASKET)
CATH URET 5FR 28IN OPEN ENDED (CATHETERS) ×3 IMPLANT
CLOTH BEACON ORANGE TIMEOUT ST (SAFETY) ×3 IMPLANT
EXTRACTOR STONE NITINOL NGAGE (UROLOGICAL SUPPLIES) IMPLANT
GLOVE BIOGEL M STRL SZ7.5 (GLOVE) ×3 IMPLANT
GOWN STRL REUS W/TWL XL LVL3 (GOWN DISPOSABLE) ×3 IMPLANT
GUIDEWIRE SENSOR ANG DUAL FLEX (WIRE) IMPLANT
GUIDEWIRE ZIPWRE .038 STRAIGHT (WIRE) ×3 IMPLANT
KIT TURNOVER KIT A (KITS) IMPLANT
LASER FIB FLEXIVA PULSE ID 365 (Laser) IMPLANT
MANIFOLD NEPTUNE II (INSTRUMENTS) ×3 IMPLANT
PACK CYSTO (CUSTOM PROCEDURE TRAY) ×3 IMPLANT
SHEATH URETERAL 12FRX35CM (MISCELLANEOUS) IMPLANT
STENT URET 6FRX24 CONTOUR (STENTS) ×4 IMPLANT
STENT URET 6FRX26 CONTOUR (STENTS) IMPLANT
TRACTIP FLEXIVA PULS ID 200XHI (Laser) IMPLANT
TRACTIP FLEXIVA PULSE ID 200 (Laser)
TUBING CONNECTING 10 (TUBING) ×2 IMPLANT
TUBING CONNECTING 10' (TUBING) ×1
TUBING UROLOGY SET (TUBING) ×3 IMPLANT

## 2020-09-17 NOTE — H&P (Signed)
PRE-OP H&P  Office Visit Report     08/29/2020   --------------------------------------------------------------------------------   Brittany Robertson  MRN: [id number]  DOB: 1941-05-08, 79 year old Female  PRIMARY CARE:  Kimberlee D. Brigitte Pulse, MD  REFERRING:  Glena Norfolk. [name], MD  PROVIDER:  Ellison Hughs, M.D.  TREATING:  Daine Gravel, NP  LOCATION:  [facility] [phone]     --------------------------------------------------------------------------------   CC/HPI: CC: Kidney check-up   HPI: The patient is an 79 year old female with a history of kidney stones. She is s/p left URS on 08/23/18 for a 4 mm left UPJ stone and right ESWL on 12/12/18.   12/26/18: She returns today for follow up. She is s/p right ESWL for a cluster of lower pole calculi. Today she states that she has passed a few small fragments since her procedure. She denies any current right flank or abdominal pain. However, she has noted some intermittent episodes of left back and flank pain. She denies any current difficulties voiding or exacerbation in voiding symptoms. She denies gross hematuria, fevers, chills, nausea, or vomiting.   03/20/19: She returns today for follow up. She denies seeing any additional fragments pass sing being seen last. She denies any ongoing right or left flank pain. She denies exacerbation of voiding symptoms, hematuria, dysuria, fevers, chills, nausea, or vomiting.   10/19/19: The patient is here today for routine follow-up and surveillance KUB. She has done well over the past year and denies interval stone passage, episodes of flank pain, dysuria, hematuria or interval UTIs. Doing well.   08/12/2020: The patient was seen in the emergency department on 08/10/2020 due to acute left-sided flank pain. She had a CT stone study that revealed a 5 mm left UVJ calculus with moderate hydronephrosis along with bilateral nonobstructing renal stones. Her left flank pain and nausea have  since resolved. She denies dysuria or episodes of gross hematuria. She has not seen a stone passed yet, but admits that she has not been straining her urine. AFVSS today.   Serum creatinine -1.15  WBC-11.8  UA showed rare bacteria, 6-10 WBC and microscopic hematuria. No urine culture was ordered in the ER   08/29/20: 79 year old female who presents today for a 2 week follow up for a obstructing left sided UVJ stone that is approximately 9mm. She has not passed the stone. She denies fevers, chills, gross hematuria. She endorses increased urinary frequency, dribbling small amounts at times and constant urge to urinate. She was unable to leave a spcimen today but will bring one back to the clinic if she is unable to void post office visit.     ALLERGIES: Codeine Derivatives Penicillins Sulfa Drugs Surgical TAPE    MEDICATIONS: Crestor 40 mg tablet  Aspirin Ec 81 mg tablet, delayed release 0 Oral  Benicar 40 mg tablet Oral  Fish Oil CAPS 0 Oral  Furosemide 20 MG Oral Tablet Oral  Glucosamine CAPS 0 Oral  Metoprolol Succinate 25 mg tablet, extended release 24 hr Oral  Mobic 15 mg tablet 0 Oral  NexIUM 40 MG Oral Capsule Delayed Release 0 Oral  Nitroquick 0.4 mg tablet, sublingual 0 Sublingual  Potassium Chloride 20 meq tablet, ext release, particles/crystals 1 Oral Daily  Prolia 60 mg/ml syringe Subcutaneous  Saline Nasal Spray SOLN 0 Nasal  Stool Softener CAPS 0 Oral  Tylenol  Vitamin C 500 mg tablet 0 Oral  Vitamin D TABS 0 Oral  Vitamin E 400 UNIT Oral Capsule 0 Oral  Zetia 10 MG Oral  Tablet 1 Oral Daily  Zofran 4 mg tablet 1 tablet PO Q 4 H PRN  Zyrtec 10 mg tablet 0 Oral     GU PSH: Cysto Remove Stent FB Sim - 09/02/2018 ESWL, Right - 2019/02/03 Ureteroscopic laser litho, Left - 02/02/18       PSH Notes: Knee Surgery, Foot Surgery, Foot Surgery, Foot Surgery, Inguinal Hernia Repair, Cataract Surgery, Knee Surgery   NON-GU PSH: Extensive Hand Surgery, Right - 02-03-16 Total Knee  Replacement, Right - 2015-02-03     GU PMH: Renal and ureteral calculus - 08/12/2020 Ureteral obstruction secondary to calculous - 08/12/2020 Hydronephrosis - 03/20/2019 History of urolithiasis - 09/30/2018 Renal calculus, Right, 8 mm right renal stone - 09/30/2018 Ureteral calculus, 4 mm left UPJ - 2018/02/02 Chronic cystitis (w/o hematuria) - February 03, 2016, Chronic cystitis, - February 03, 2015 Urinary Urgency - 2016-02-03 Mixed incontinence, Urge and stress incontinence - 02-02-2014 Cystocele, midline, Cystocele, midline - 02-Feb-2013 Nocturia, Nocturia - 02/02/2013      PMH Notes:  2006-12-29 15:07:07 - Note: Arthritis  Degenerative disc dz.   NON-GU PMH: Encounter for general adult medical examination without abnormal findings, Encounter for preventive health examination - 2015/02/03 Personal history of other diseases of the circulatory system, History of hypertension - 02-02-13 Personal history of other diseases of the digestive system, History of esophageal reflux - 2013/02/02 Personal history of other endocrine, nutritional and metabolic disease, History of hypercholesterolemia - 02-02-2013    FAMILY HISTORY: Death - Father, Mother   SOCIAL HISTORY: Marital Status: Married Preferred Language: English; Ethnicity: Not Hispanic Or Latino; Race: White Current Smoking Status: Patient has never smoked.  Drinks 1 drink per day. Types of alcohol consumed: Wine.  Drinks 1 caffeinated drink per day. Patient's occupation is/was Retired.     Notes: Never A Smoker, Occupation:, Death In The Family Mother, Tobacco Use, Alcohol Use, Marital History - Currently Married, Caffeine Use   REVIEW OF SYSTEMS:    GU Review Female:   Patient reports stream starts and stops, trouble starting your stream, and get up at night to urinate. Patient denies hard to postpone urination, burning /pain with urination, frequent urination, being pregnant, leakage of urine, and have to strain to urinate.  Gastrointestinal (Upper):   Patient denies nausea, vomiting, and indigestion/  heartburn.  Gastrointestinal (Lower):   Patient denies diarrhea and constipation.  Constitutional:   Patient denies fever, night sweats, weight loss, and fatigue.  Skin:   Patient denies skin rash/ lesion and itching.  Eyes:   Patient denies blurred vision and double vision.  Ears/ Nose/ Throat:   Patient denies sore throat and sinus problems.  Hematologic/Lymphatic:   Patient denies swollen glands and easy bruising.  Cardiovascular:   Patient denies leg swelling and chest pains.  Respiratory:   Patient denies cough and shortness of breath.  Endocrine:   Patient denies excessive thirst.  Musculoskeletal:   Patient reports back pain. Patient denies joint pain.  Neurological:   Patient denies headaches and dizziness.  Psychologic:   Patient denies depression and anxiety.   VITAL SIGNS:      08/29/2020 11:17 AM  Weight 230 lb / 104.33 kg  Height 64 in / 162.56 cm  BP 166/79 mmHg  Pulse 70 /min  Temperature 97.5 F / 36.3 C  BMI 39.5 kg/m   MULTI-SYSTEM PHYSICAL EXAMINATION:    Constitutional: Well-nourished. No physical deformities. Normally developed. Good grooming.  Respiratory: No labored breathing, no use of accessory muscles.   Cardiovascular: Normal temperature, normal extremity pulses, no swelling,  no varicosities.  Skin: No paleness, no jaundice, no cyanosis. No lesion, no ulcer, no rash.  Neurologic / Psychiatric: Oriented to time, oriented to place, oriented to person. No depression, no anxiety, no agitation.  Gastrointestinal: No mass, no tenderness, no rigidity, non obese abdomen.  Musculoskeletal: Normal gait and station of head and neck.     Complexity of Data:  Source Of History:  Patient, Medical Record Summary  Records Review:   Previous Doctor Records, Previous Hospital Records, Previous Patient Records  Urine Test Review:   Urinalysis, Urine Culture  X-Ray Review: KUB: Reviewed Films. Discussed With Patient.  Renal Ultrasound (Limited): Reviewed Films. Reviewed  Report. Discussed With Patient.  C.T. Abdomen/Pelvis: Reviewed Films. Reviewed Report. Discussed With Patient.     08/29/20  Urinalysis  Urine Appearance Slightly Cloudy   Urine Color Yellow   Urine Glucose Neg mg/dL  Urine Bilirubin Neg mg/dL  Urine Ketones Neg mg/dL  Urine Specific Gravity 1.025   Urine Blood Neg ery/uL  Urine pH 5.5   Urine Protein Neg mg/dL  Urine Urobilinogen 0.2 mg/dL  Urine Nitrites Neg   Urine Leukocyte Esterase 3+ leu/uL  Urine WBC/hpf 6 - 10/hpf   Urine RBC/hpf NS (Not Seen)   Urine Epithelial Cells 6 - 10/hpf   Urine Bacteria Rare (0-9/hpf)   Urine Mucous Not Present   Urine Yeast NS (Not Seen)   Urine Trichomonas Not Present   Urine Cystals NS (Not Seen)   Urine Casts NS (Not Seen)   Urine Sperm Not Present    PROCEDURES:         Renal Ultrasound (Limited) - 25003  LT Kidney: Length: 9.8 cm Depth: 4.5 cm Cortical Width:1.3 cm Width:4.3 cm    Left Kidney/Ureter:  Hydro noted, ? Multiple stones, largest= 0.69cm UP.  Bladder:  Not seen.      Difficult study due to increased bowel gas and body habitus.  Patient confirmed No Neulasta OnPro Device.            KUB - [id number]  A single view of the abdomen is obtained. There is a prominent overlying bowel gas pattern. I am unable to visualize an opacity within the left renal shadow or within the expected anatomical course of the left ureter.      Patient confirmed No Neulasta OnPro Device.           Urinalysis w/Scope Dipstick Dipstick Cont'd Micro  Color: Yellow Bilirubin: Neg mg/dL WBC/hpf: 6 - 10/hpf  Appearance: Slightly Cloudy Ketones: Neg mg/dL RBC/hpf: NS (Not Seen)  Specific Gravity: 1.025 Blood: Neg ery/uL Bacteria: Rare (0-9/hpf)  pH: 5.5 Protein: Neg mg/dL Cystals: NS (Not Seen)  Glucose: Neg mg/dL Urobilinogen: 0.2 mg/dL Casts: NS (Not Seen)    Nitrites: Neg Trichomonas: Not Present    Leukocyte Esterase: 3+ leu/uL Mucous: Not Present      Epithelial Cells: 6 - 10/hpf       Yeast: NS (Not Seen)      Sperm: Not Present    ASSESSMENT:      ICD-10 Details  1 GU:   Hydronephrosis - N13.0 Left, Acute, Systemic Symptoms  2   Renal and ureteral calculus - N20.2 Left, Acute, Systemic Symptoms   PLAN:           Orders Labs CULTURE, URINE          Schedule Return Visit/Planned Activity: Next Available Appointment - Schedule Surgery          Document Letter(s):  Created for Patient:  Clinical Summary         Notes:    The risks, benefits and alternatives of cystoscopy with BILATERAL ureteroscopy, laser lithotripsy and ureteral stent placement was discussed the patient.  Risks included, but are not limited to: bleeding, urinary tract infection, ureteral injury/avulsion, ureteral stricture formation, retained stone fragments, the possibility that multiple surgeries may be required to treat the stone(s), MI, stroke, PE and the inherent risks of general anesthesia.  The patient voices understanding and wishes to proceed.

## 2020-09-17 NOTE — Op Note (Signed)
Operative Note  Preoperative diagnosis:  1.  5 mm left distal ureteral stone 2.  Multiple, bilateral 5 mm renal stones  Postoperative diagnosis: 1.  Multiple, bilateral 5 mm renal stones  Procedure(s): 1.  Cystoscopy with bilateral ureteroscopy, bilateral holmium laser lithotripsy and bilateral JJ stent placement 2.  Bilateral retrograde pyelograms with intraoperative interpretation of fluoroscopic imaging  Surgeon: Ellison Hughs, MD  Assistants:  None  Anesthesia:  General  Complications:  None  EBL: Less than 5 mL  Specimens: 1.  None  Drains/Catheters: 1.  Bilateral 6 French, 24 cm JJ stents without tethers  Intraoperative findings:   1. No evidence of left distal ureteral calculus that was seen on CT from 08/10/2020. 2. Left retrograde pyelogram revealed no filling defects along the entire length of the left ureter or within the left renal pelvis 3. Right retrograde pyelogram revealed no filling defects along the entire length of the right ureter or within the right renal pelvis 4. Multiple, bilateral 5 mm nonobstructing renal calculi  Indication:  Brittany Robertson is a 79 y.o. female with a history of kidney stones.  She was seen in the emergency department on 08/10/2020 due to acute left-sided flank pain.  She had a CT stone study at that time that revealed a 5 mm left UVJ calculus.  She was given a trial of medical expulsive therapy, but continues to have left-sided flank pain.  She denies interval stone passage.  She has been consented for the above procedures, voices understanding and wishes to proceed.  Description of procedure:  After informed consent was obtained, the patient was brought to the operating room and general LMA anesthesia was administered. The patient was then placed in the dorsolithotomy position and prepped and draped in the usual sterile fashion. A timeout was performed. A 23 French rigid cystoscope was then inserted into the urethral meatus  and advanced into the bladder under direct vision. A complete bladder survey revealed no intravesical pathology.  A 5 French ureteral catheter was then inserted into the left ureteral orifice and a retrograde pyelogram was obtained, with the findings listed above.  A Glidewire was then used to intubate the lumen of the ureteral catheter and was advanced up to the left renal pelvis, under fluoroscopic guidance.  The catheter was then removed, leaving the wire in place.  A flexible ureteroscope was then advanced into the distal aspects of the left ureter where no obstructing stone could be identified.  The flexible ureteroscope was then advanced up to the left renal pelvis, alongside the wire.  A full inspection of the left renal pelvis revealed a total of two 5 mm renal stones.  A 200 m holmium laser was then used to dust the stones.  The flexible ureteroscope was then removed, leaving the wire in place.  A 6 French, 24 cm JJ stent was then advanced over the wire and into good position within the left collecting system, confirming placement via fluoroscopy.  A 5 French ureteral catheter was then inserted into the right ureteral orifice and a retrograde pyelogram was obtained, with the findings listed above.  A Glidewire was then used to intubate the lumen of the ureteral catheter and was advanced up to the right renal pelvis, under fluoroscopic guidance.  The catheter was then removed, leaving the wire in place.  The flexible ureteroscope was then advanced up the right ureter to the right renal pelvis.  A full inspection of the right renal pelvis revealed a total of three  5 mm renal stones.  The 200 m holmium laser was then used to dust the stones.  The flexible ureteroscope was then removed under direct vision, leaving the wire in place.  A 6 French, 24 cm JJ stent was then advanced over the wire and into good position within the right collecting system, confirming placement via fluoroscopy.  The  patient's bladder was drained.  She tolerated the procedure well and was transferred to the postanesthesia in stable condition.  Plan: Follow-up on 09/24/2020 for office cystoscopy and bilateral stent removal

## 2020-09-17 NOTE — Anesthesia Postprocedure Evaluation (Signed)
Anesthesia Post Note  Patient: Brittany Robertson  Procedure(s) Performed: CYSTOSCOPY/URETEROSCOPY/HOLMIUM LASER/STENT PLACEMENT (Bilateral )     Patient location during evaluation: PACU Anesthesia Type: General Level of consciousness: awake and alert Pain management: pain level controlled Vital Signs Assessment: post-procedure vital signs reviewed and stable Respiratory status: spontaneous breathing, nonlabored ventilation, respiratory function stable and patient connected to nasal cannula oxygen Cardiovascular status: blood pressure returned to baseline and stable Postop Assessment: no apparent nausea or vomiting Anesthetic complications: no   No complications documented.  Last Vitals:  Vitals:   09/17/20 1635 09/17/20 1700  BP: (!) 162/85 (!) 160/83  Pulse: 67 70  Resp: 16 20  Temp: 36.7 C   SpO2: 99% 100%    Last Pain:  Vitals:   09/17/20 1700  TempSrc:   PainSc: 0-No pain                 Tiajuana Amass

## 2020-09-17 NOTE — Anesthesia Procedure Notes (Signed)
Procedure Name: LMA Insertion Date/Time: 09/17/2020 2:34 PM Performed by: Mitzie Na, CRNA Pre-anesthesia Checklist: Patient identified, Emergency Drugs available, Suction available and Patient being monitored Patient Re-evaluated:Patient Re-evaluated prior to induction Oxygen Delivery Method: Circle system utilized Preoxygenation: Pre-oxygenation with 100% oxygen Induction Type: IV induction LMA: LMA inserted LMA Size: 4.0 Tube type: Oral Number of attempts: 1 Placement Confirmation: positive ETCO2 and breath sounds checked- equal and bilateral Tube secured with: Tape Dental Injury: Teeth and Oropharynx as per pre-operative assessment

## 2020-09-17 NOTE — Transfer of Care (Signed)
Immediate Anesthesia Transfer of Care Note  Patient: Brittany Robertson  Procedure(s) Performed: CYSTOSCOPY/URETEROSCOPY/HOLMIUM LASER/STENT PLACEMENT (Bilateral )  Patient Location: PACU  Anesthesia Type:General  Level of Consciousness: awake, alert , oriented and patient cooperative  Airway & Oxygen Therapy: Patient Spontanous Breathing and Patient connected to face mask oxygen  Post-op Assessment: Report given to RN, Post -op Vital signs reviewed and stable and Patient moving all extremities  Post vital signs: Reviewed and stable  Last Vitals:  Vitals Value Taken Time  BP    Temp    Pulse 72 09/17/20 1534  Resp 17 09/17/20 1534  SpO2 100 % 09/17/20 1534  Vitals shown include unvalidated device data.  Last Pain:  Vitals:   09/17/20 1241  TempSrc: Oral  PainSc: 0-No pain         Complications: No complications documented.

## 2020-09-18 ENCOUNTER — Encounter (HOSPITAL_COMMUNITY): Payer: Self-pay | Admitting: Urology

## 2020-11-01 ENCOUNTER — Ambulatory Visit: Payer: Medicare Other | Admitting: Physician Assistant

## 2020-11-07 DIAGNOSIS — N200 Calculus of kidney: Secondary | ICD-10-CM | POA: Diagnosis not present

## 2020-11-13 DIAGNOSIS — E782 Mixed hyperlipidemia: Secondary | ICD-10-CM | POA: Diagnosis not present

## 2020-11-13 DIAGNOSIS — M150 Primary generalized (osteo)arthritis: Secondary | ICD-10-CM | POA: Diagnosis not present

## 2020-11-13 DIAGNOSIS — M8580 Other specified disorders of bone density and structure, unspecified site: Secondary | ICD-10-CM | POA: Diagnosis not present

## 2020-11-13 DIAGNOSIS — R7301 Impaired fasting glucose: Secondary | ICD-10-CM | POA: Diagnosis not present

## 2020-11-13 DIAGNOSIS — I714 Abdominal aortic aneurysm, without rupture: Secondary | ICD-10-CM | POA: Diagnosis not present

## 2020-11-13 DIAGNOSIS — R911 Solitary pulmonary nodule: Secondary | ICD-10-CM | POA: Diagnosis not present

## 2020-11-13 DIAGNOSIS — I110 Hypertensive heart disease with heart failure: Secondary | ICD-10-CM | POA: Diagnosis not present

## 2020-11-13 DIAGNOSIS — I25119 Atherosclerotic heart disease of native coronary artery with unspecified angina pectoris: Secondary | ICD-10-CM | POA: Diagnosis not present

## 2020-11-13 DIAGNOSIS — K219 Gastro-esophageal reflux disease without esophagitis: Secondary | ICD-10-CM | POA: Diagnosis not present

## 2020-11-13 DIAGNOSIS — Z0000 Encounter for general adult medical examination without abnormal findings: Secondary | ICD-10-CM | POA: Diagnosis not present

## 2020-11-13 DIAGNOSIS — I5030 Unspecified diastolic (congestive) heart failure: Secondary | ICD-10-CM | POA: Diagnosis not present

## 2020-11-18 DIAGNOSIS — M19012 Primary osteoarthritis, left shoulder: Secondary | ICD-10-CM | POA: Diagnosis not present

## 2020-11-18 DIAGNOSIS — M25812 Other specified joint disorders, left shoulder: Secondary | ICD-10-CM | POA: Diagnosis not present

## 2020-12-16 ENCOUNTER — Encounter: Payer: Self-pay | Admitting: Physician Assistant

## 2020-12-16 ENCOUNTER — Other Ambulatory Visit: Payer: Self-pay

## 2020-12-16 ENCOUNTER — Ambulatory Visit: Payer: Medicare Other | Admitting: Physician Assistant

## 2020-12-16 DIAGNOSIS — L814 Other melanin hyperpigmentation: Secondary | ICD-10-CM

## 2020-12-16 DIAGNOSIS — L820 Inflamed seborrheic keratosis: Secondary | ICD-10-CM

## 2020-12-16 DIAGNOSIS — L821 Other seborrheic keratosis: Secondary | ICD-10-CM

## 2020-12-16 DIAGNOSIS — D229 Melanocytic nevi, unspecified: Secondary | ICD-10-CM

## 2020-12-16 DIAGNOSIS — Z1283 Encounter for screening for malignant neoplasm of skin: Secondary | ICD-10-CM | POA: Diagnosis not present

## 2020-12-16 DIAGNOSIS — L578 Other skin changes due to chronic exposure to nonionizing radiation: Secondary | ICD-10-CM | POA: Diagnosis not present

## 2020-12-16 DIAGNOSIS — D1800 Hemangioma unspecified site: Secondary | ICD-10-CM | POA: Diagnosis not present

## 2020-12-16 DIAGNOSIS — D485 Neoplasm of uncertain behavior of skin: Secondary | ICD-10-CM

## 2020-12-16 NOTE — Patient Instructions (Signed)

## 2020-12-19 ENCOUNTER — Ambulatory Visit: Payer: Medicare Other | Admitting: Physician Assistant

## 2020-12-26 DIAGNOSIS — M5416 Radiculopathy, lumbar region: Secondary | ICD-10-CM | POA: Diagnosis not present

## 2020-12-27 ENCOUNTER — Encounter: Payer: Self-pay | Admitting: Physician Assistant

## 2020-12-30 DIAGNOSIS — M25512 Pain in left shoulder: Secondary | ICD-10-CM | POA: Diagnosis not present

## 2021-01-02 ENCOUNTER — Ambulatory Visit (INDEPENDENT_AMBULATORY_CARE_PROVIDER_SITE_OTHER): Payer: Medicare Other | Admitting: Pharmacist

## 2021-01-02 ENCOUNTER — Other Ambulatory Visit: Payer: Self-pay

## 2021-01-02 DIAGNOSIS — Z7189 Other specified counseling: Secondary | ICD-10-CM | POA: Insufficient documentation

## 2021-01-02 MED ORDER — FAMOTIDINE 20 MG PO TABS: ORAL_TABLET | ORAL | 3 refills | Status: AC

## 2021-01-02 NOTE — Progress Notes (Signed)
Patient presents today accompanied by her husband for medication review. She brings a complete list of the medications and supplements she takes.   After view of her meds and discussion of the longer term side effects of PPI (including bone fracture and cdiff). It was decided to stop omeprazole. Will replace with famotidine only if needed and used at the least frequency needed. Will also stop Fish oil, Glucosamine and Chondroitin, CoQ10 and Ca due to lack of efficacy. Will stop Ca due to concern that it could cause arthrosclerosis and pt will increase Ca+ in diet. Will increase vit D to 2000 IU daily

## 2021-01-02 NOTE — Patient Instructions (Signed)
STOP taking: Calcium/vit D supplement Omeprazole Fish oil Glucosamine and Chondrotin  Start taking Vit D 2000 IU daily  Stop omeprazole. If your symptoms come back then take famotidine 20mg  daily for a few days then stop taking. Can use tums as needed for break through symptoms.  If you find you need famotidine almost every day, the its ok to start taking daily  Call me at [phone] with any questions

## 2021-01-06 ENCOUNTER — Other Ambulatory Visit: Payer: Self-pay | Admitting: *Deleted

## 2021-01-06 MED ORDER — FUROSEMIDE 20 MG PO TABS: 20.0000 mg | ORAL_TABLET | Freq: Every day | ORAL | 1 refills | Status: DC | PRN

## 2021-01-09 DIAGNOSIS — M5412 Radiculopathy, cervical region: Secondary | ICD-10-CM | POA: Diagnosis not present

## 2021-01-09 NOTE — Progress Notes (Signed)
   Follow-Up Visit   Subjective  Brittany Robertson is a 80 y.o. female who presents for the following: Annual Exam (Per patient full body skin check. Per patient she has a spot on her right abdomen itches, bleeds, x 6 months. ).   The following portions of the chart were reviewed this encounter and updated as appropriate:  Tobacco  Allergies  Meds  Problems  Med Hx  Surg Hx  Fam Hx      Objective  Well appearing patient in no apparent distress; mood and affect are within normal limits.  A full examination was performed including scalp, head, eyes, ears, nose, lips, neck, chest, axillae, abdomen, back, buttocks, bilateral upper extremities, bilateral lower extremities, hands, feet, fingers, toes, fingernails, and toenails. All findings within normal limits unless otherwise noted below.  Objective  Head - to toe: No atypical nevi No signs of non-mole skin cancer.   Objective  Right Abdomen (side) - Upper: Thick brown crust        Assessment & Plan  Skin exam for malignant neoplasm Head - to toe  Regular skin exams  Neoplasm of uncertain behavior of skin Right Abdomen (side) - Upper  Skin / nail biopsy Type of biopsy: tangential   Informed consent: discussed and consent obtained   Timeout: patient name, date of birth, surgical site, and procedure verified   Procedure prep:  Patient was prepped and draped in usual sterile fashion (Non sterile) Prep type:  Chlorhexidine Anesthesia: the lesion was anesthetized in a standard fashion   Anesthetic:  1% lidocaine w/ epinephrine 1-100,000 local infiltration Instrument used: flexible razor blade   Hemostasis achieved with: aluminum chloride and electrodesiccation   Outcome: patient tolerated procedure well   Post-procedure details: sterile dressing applied and wound care instructions given   Dressing type: bandage and petrolatum    Specimen 1 - Surgical pathology Differential Diagnosis: R/O Atypia  Check Margins:  No  Cautery after biopsy   Lentigines - Scattered tan macules - Discussed due to sun exposure - Benign, observe - Call for any changes  Seborrheic Keratoses - Stuck-on, waxy, tan-brown papules and plaques  - Discussed benign etiology and prognosis. - Observe - Call for any changes  Melanocytic Nevi - Tan-brown and/or pink-flesh-colored symmetric macules and papules - Benign appearing on exam today - Observation - Call clinic for new or changing moles - Recommend daily use of broad spectrum spf 30+ sunscreen to sun-exposed areas.   Hemangiomas - Red papules - Discussed benign nature - Observe - Call for any changes  Actinic Damage - diffuse scaly erythematous macules with underlying dyspigmentation - Recommend daily broad spectrum sunscreen SPF 30+ to sun-exposed areas, reapply every 2 hours as needed.  - Call for new or changing lesions.    I, Deya Bigos, PA-C, have reviewed all documentation's for this visit.  The documentation on 01/09/21 for the exam, diagnosis, procedures and orders are all accurate and complete.

## 2021-02-04 ENCOUNTER — Other Ambulatory Visit: Payer: Self-pay

## 2021-02-04 ENCOUNTER — Ambulatory Visit: Payer: Medicare Other | Admitting: Cardiology

## 2021-02-04 ENCOUNTER — Encounter: Payer: Self-pay | Admitting: Cardiology

## 2021-02-04 VITALS — BP 126/72 | HR 67 | Ht 64.0 in | Wt 221.2 lb

## 2021-02-04 DIAGNOSIS — I2510 Atherosclerotic heart disease of native coronary artery without angina pectoris: Secondary | ICD-10-CM | POA: Diagnosis not present

## 2021-02-04 DIAGNOSIS — I712 Thoracic aortic aneurysm, without rupture, unspecified: Secondary | ICD-10-CM

## 2021-02-04 DIAGNOSIS — E785 Hyperlipidemia, unspecified: Secondary | ICD-10-CM | POA: Diagnosis not present

## 2021-02-04 DIAGNOSIS — I10 Essential (primary) hypertension: Secondary | ICD-10-CM | POA: Diagnosis not present

## 2021-02-04 DIAGNOSIS — I5032 Chronic diastolic (congestive) heart failure: Secondary | ICD-10-CM

## 2021-02-04 DIAGNOSIS — I7121 Aneurysm of the ascending aorta, without rupture: Secondary | ICD-10-CM

## 2021-02-04 NOTE — Patient Instructions (Addendum)
Medication Instructions:  Your physician recommends that you continue on your current medications as directed. Please refer to the Current Medication list given to you today.  *If you need a refill on your cardiac medications before your next appointment, please call your pharmacy*  Labs: TODAY: BMET  Testing/Procedures: Your provider recommends that you have a Chest CTA scan. CT Angiography (CTA), is a special type of CT scan that uses a computer to produce multi-dimensional views of major blood vessels throughout the body. In CT angiography, a contrast material is injected through an IV to help visualize the blood vessels   Follow-Up: At [facility], you and your health needs are our priority.  As part of our continuing mission to provide you with exceptional heart care, we have created designated Provider Care Teams.  These Care Teams include your primary Cardiologist (physician) and Advanced Practice Providers (APPs -  Physician Assistants and Nurse Practitioners) who all work together to provide you with the care you need, when you need it.  Your next appointment:   1 year(s)  The format for your next appointment:   In Person  Provider:   You may see Fransico Him, MD or one of the following Advanced Practice Providers on your designated Care Team:    Melina Copa, PA-C  Ermalinda Barrios, PA-C

## 2021-02-04 NOTE — Addendum Note (Signed)
Addended by: Antonieta Iba on: 02/04/2021 01:33 PM   Modules accepted: Orders

## 2021-02-04 NOTE — Progress Notes (Signed)
Cardiology Office Note:    Date:  02/04/2021   ID:  Brittany Robertson, DOB 01-Jul-1941, MRN [id number]  PCP:  Mayra Neer, MD  Cardiologist:  Fransico Him, MD    Referring MD: Mayra Neer, MD   Chief Complaint  Patient presents with  . Coronary Artery Disease  . Hypertension  . Hyperlipidemia  . Congestive Heart Failure    History of Present Illness:    Brittany Robertson is a 80 y.o. female with a hx of  ASCAD s/p PCI the LAD in 2006 with repeat cardiac cath in 2014 showing 30% mid LAD distal to the stent and otherwise normal coronary arteries and repeat heart cath 08/2016 showed normal coronary arteries with patent LAD stent and normal LVEDP. Her shortness of breath is felt to be due to obesity and deconditioning.. She has a history of hypertension, obesity,chronic diastolic CHF and hyperlipidemia. She also has a history of mild aortic aneurysm with the ascending aorta measuring 4.2 cm.  She is here today for followup and is doing well.  She occasionally has some vague chest tightness in her upper chest but nothing like her typical angina and very rare.  She denies any anginal chest pain, SOB, DOE, PND, orthopnea, dizziness, palpitations or syncope. She has chronic LE edema which is stable.  She is compliant with her meds and is tolerating meds with no SE.    Past Medical History:  Diagnosis Date  . Ascending aortic aneurysm ([place])    23mm 07/2018  . Bruises easily   . Chronic diastolic CHF (congestive heart failure) ([place])   . Complication of anesthesia    "takes a long time to wake up"bp little low when wakes up  . Coronary artery disease    PCI 2006, repeat cath 30% mid LAD distal to stent otherwise normal coronary arteries, cath 08/2013 with patent LAD stent and otherwise normal coronary arteries with diastolic dysfunction. Cath 08/2016 with normal RHC, normal LVEDP, patent LAD stent with otherwise normal coronary arteries  . DDD (degenerative disc disease), cervical   .  Dilated aortic root ([place])    27mm by echo 07/2017  . Dysrhythmia   . GERD (gastroesophageal reflux disease)   . History of kidney stones   . Hyperlipidemia   . Hypertension   . Left ureteral stone   . OA (osteoarthritis)   . Obesity   . Osteopenia   . PONV (postoperative nausea and vomiting)    Motion sickness when move out of PACU to room  . PVC's (premature ventricular contractions)   . SOB (shortness of breath) 07/13/2016   non cardiac and secondary to deconditioning and obesity    Past Surgical History:  Procedure Laterality Date  . ABDOMINAL HYSTERECTOMY    . APPENDECTOMY  1957  . arthroscopic knee  05/2002,04/07/2005   right and then also torn muscle surgery on right knee (2003(  . arthroscopic knee  01/02/2005,09/21/2006   left  . CARDIAC CATHETERIZATION N/A 08/31/2016   Procedure: Right/Left Heart Cath and Coronary Angiography;  Surgeon: Sherren Mocha, MD;  Location: [facility];  Service: Cardiovascular;  Laterality: N/A;  . CHOLECYSTECTOMY  1988  . COLONOSCOPY  2010   with Endo  . CORONARY ANGIOPLASTY  02/05/2004  . CYST REMOVAL HAND  2000  . CYSTOSCOPY/URETEROSCOPY/HOLMIUM LASER/STENT PLACEMENT Left 08/26/2018   Procedure: CYSTOSCOPY/RETROGRADE/URETEROSCOPY/HOLMIUM LASER/ STONE BASKETRY/ STENT PLACEMENT;  Surgeon: Ceasar Mons, MD;  Location: [facility];  Service: Urology;  Laterality: Left;  ONLY NEEDS  41 MIN  . CYSTOSCOPY/URETEROSCOPY/HOLMIUM LASER/STENT PLACEMENT Bilateral 09/17/2020   Procedure: CYSTOSCOPY/URETEROSCOPY/HOLMIUM LASER/STENT PLACEMENT;  Surgeon: Ceasar Mons, MD;  Location: [facility];  Service: Urology;  Laterality: Bilateral;  . [place] OF UTERUS  1972  . EXTRACORPOREAL SHOCK WAVE LITHOTRIPSY Right 12/12/2018   Procedure: EXTRACORPOREAL SHOCK WAVE LITHOTRIPSY (ESWL);  Surgeon: Ceasar Mons, MD;  Location: [facility];  Service: Urology;  Laterality: Right;  . EYE SURGERY  01/2007    bilateral cataract with lens implant  . FOOT SURGERY  01/2012   left and right bunion and hammer toe  . HAND SURGERY  05/29/2011   joint removed  . [place]  . HERNIA REPAIR  02/2010   incisional hernioa  . KNEE ARTHROSCOPY    . LEFT HEART CATHETERIZATION WITH CORONARY ANGIOGRAM N/A 09/19/2013   Procedure: LEFT HEART CATHETERIZATION WITH CORONARY ANGIOGRAM;  Surgeon: Sueanne Margarita, MD;  Location: [facility];  Service: Cardiovascular;  Laterality: N/A;  . neck injections  03/2014   back and neck injections from Dr. Nelva Bush  . stents     x1  . tear duct surgery  1990's   bilateral  . TONSILLECTOMY     as child  . TOTAL KNEE ARTHROPLASTY Left 01/04/2015   Procedure: LEFT TOTAL KNEE ARTHROPLASTY;  Surgeon: Sydnee Cabal, MD;  Location: [facility];  Service: Orthopedics;  Laterality: Left;  . TOTAL KNEE ARTHROPLASTY Right 05/10/2015   Procedure: TOTAL RIGHT KNEE ARTHROPLASTY;  Surgeon: Sydnee Cabal, MD;  Location: [facility];  Service: Orthopedics;  Laterality: Right;    Current Medications: Current Meds  Medication Sig  . acetaminophen (TYLENOL) 500 MG tablet Take 500-1,000 mg by mouth every 6 (six) hours as needed for moderate pain or headache.   Marland Kitchen aspirin 81 MG tablet Take 81 mg by mouth every evening.   . cetirizine (ZYRTEC) 10 MG tablet Take 10 mg by mouth every morning.  . cholecalciferol (VITAMIN D) 400 UNITS TABS Take 400 Units by mouth 2 (two) times daily.  Marland Kitchen denosumab (PROLIA) 60 MG/ML SOLN injection Inject 60 mg into the skin every 6 (six) months. Administer in upper arm, thigh, or abdomen  . docusate sodium (COLACE) 100 MG capsule Take 100 mg by mouth 2 (two) times daily.  Marland Kitchen ezetimibe (ZETIA) 10 MG tablet TAKE 1 TABLET BY MOUTH IN  THE EVENING (Patient taking differently: Take 10 mg by mouth daily.)  . famotidine (PEPCID) 20 MG tablet Take one tablet by mouth daily as needed  . fluocinonide gel (LIDEX) 0.05 % Apply topically.  . furosemide (LASIX) 20 MG tablet  Take 1 tablet (20 mg total) by mouth daily as needed for fluid or edema.  . Melatonin 5 MG CAPS Take 5 mg by mouth at bedtime.  . meloxicam (MOBIC) 15 MG tablet Take 15 mg by mouth every evening.  . Menthol, Topical Analgesic, (ICY HOT) 16 % LIQD Apply topically.  . methocarbamol (ROBAXIN) 500 MG tablet Take 500 mg by mouth 4 (four) times daily as needed for muscle spasms (cramps).  . metoprolol succinate (TOPROL-XL) 25 MG 24 hr tablet Take 0.5 tablets (12.5 mg total) by mouth daily.  . nitroGLYCERIN (NITROSTAT) 0.4 MG SL tablet Place 1 tablet (0.4 mg total) under the tongue every 5 (five) minutes as needed. As needed for chest pain x 3 doses (Patient taking differently: Place 0.4 mg under the tongue every 5 (five) minutes as needed for chest pain.)  . olmesartan (BENICAR) 40 MG tablet Take 1  tablet (40 mg total) by mouth daily.  Marland Kitchen oxybutynin (DITROPAN) 5 MG tablet Take 5 mg by mouth at bedtime.   . polyethylene glycol (MIRALAX / GLYCOLAX) 17 g packet Take 17 g by mouth daily as needed.  . potassium chloride SA (K-DUR,KLOR-CON) 20 MEQ tablet Take 1 tablet by mouth  every morning (Patient taking differently: Take 20 mEq by mouth daily.)  . Probiotic Product (PROBIOTIC-10 PO) Take 1 tablet by mouth daily at 6 (six) AM.  . rosuvastatin (CRESTOR) 20 MG tablet Take 20 mg by mouth daily.  . sodium chloride (OCEAN) 0.65 % SOLN nasal spray Place 1 spray into both nostrils as needed for congestion.  . vitamin C (ASCORBIC ACID) 500 MG tablet Take 1,000 mg by mouth 2 (two) times daily.  . Vitamin D, Cholecalciferol, 10 MCG (400 UNIT) CAPS Take 400 Int'l Units/1.9m2 by mouth every Monday, Wednesday, and Friday.  . vitamin E 180 MG (400 UNITS) capsule Take 400 Units by mouth daily.     Allergies:   Codeine, Sulfa antibiotics, Sulfasalazine, Adhesive [tape], Atorvastatin, Other, and Penicillins   Social History   Socioeconomic History  . Marital status: Married    Spouse name: Not on file  . Number of  children: Not on file  . Years of education: Not on file  . Highest education level: Not on file  Occupational History  . Not on file  Tobacco Use  . Smoking status: Never Smoker  . Smokeless tobacco: Never Used  Vaping Use  . Vaping Use: Never used  Substance and Sexual Activity  . Alcohol use: Yes    Alcohol/week: 1.0 standard drink    Types: 1 Glasses of wine per week    Comment: occassionally  . Drug use: No  . Sexual activity: Not on file  Other Topics Concern  . Not on file  Social History Narrative  . Not on file   Social Determinants of Health   Financial Resource Strain: Not on file  Food Insecurity: Not on file  Transportation Needs: Not on file  Physical Activity: Not on file  Stress: Not on file  Social Connections: Not on file     Family History: The patient's family history includes Cancer in her mother; Heart Problems in her father; Hypertension in her father and mother.  ROS:   Please see the history of present illness.    ROS  All other systems reviewed and negative.   EKGs/Labs/Other Studies Reviewed:    The following studies were reviewed today:hospital notes from recent ER visit.  Outside labs from PCP  EKG:  EKG is not ordered today.    Recent Labs: 09/11/2020: BUN 22; Creatinine, Ser 0.75; Hemoglobin 13.8; Platelets 192; Potassium 4.3; Sodium 140   Recent Lipid Panel    Component Value Date/Time   CHOL 102 07/05/2019 0730   TRIG 103 07/05/2019 0730   HDL 48 07/05/2019 0730   CHOLHDL 2.1 07/05/2019 0730   CHOLHDL 2.1 04/24/2016 0850   VLDL 26 04/24/2016 0850   LDLCALC 35 07/05/2019 0730    Physical Exam:    VS:  BP 126/72   Pulse 67   Ht 5\' 4"  (1.626 m)   Wt 221 lb 3.2 oz (100.3 kg)   SpO2 99%   BMI 37.97 kg/m     Wt Readings from Last 3 Encounters:  02/04/21 221 lb 3.2 oz (100.3 kg)  09/17/20 230 lb (104.3 kg)  09/11/20 230 lb (104.3 kg)    GEN: Well nourished, well developed  in no acute distress HEENT: Normal NECK:  No JVD; No carotid bruits LYMPHATICS: No lymphadenopathy CARDIAC:RRR, no murmurs, rubs, gallops RESPIRATORY:  Clear to auscultation without rales, wheezing or rhonchi  ABDOMEN: Soft, non-tender, non-distended MUSCULOSKELETAL:  No edema; No deformity  SKIN: Warm and dry NEUROLOGIC:  Alert and oriented x 3 PSYCHIATRIC:  Normal affect   ASSESSMENT:    1. Coronary artery disease involving native coronary artery of native heart without angina pectoris   2. Dyslipidemia, goal LDL below 70   3. Primary hypertension   4. Ascending aortic aneurysm ([place])   5. Chronic diastolic CHF (congestive heart failure) ([facility])    PLAN:    In order of problems listed above:  1.  ASCAD -S/p PCI of the LAD in 2006 -repeat cardiac cath in 2014 showing 30% mid LAD distal to the stent and otherwise normal coronary arteries and repeat heart cath 08/2016 showed normal coronary arteries with patent LAD stent and normal LVEDP -she denies any anginal symptoms but occasionally has some vague tightness in her upper chest that is nothing like her typical angina and is nonexertional and does not sound cardiac in origin. I have told her to let me know if she develops any of her typical anginal symptoms.  -continue ASA 81mg  daily, BB and statin  2.  HLD -LDL goal is < 70 -LDL was 28 in Jan 2022 -continue Zetia 10mg  daily and Crestor 20mg  daily  3.  HTN -BP controlled -continue Toprol XL 12.5mg  daily and Benicar 40mg  daily -creatinine normal at 0.74 and K+ 4.4 in Jan 2022  4.  Ascending aortic aneurysm -2D echo showed 3.5 x 4.2cm and 3.8 x 4.2cm on Chest CT 12/2019 -repeat chest CTA to reassess  5.  Chronic diastolic CHF -she does not appear volume overloaded on exam today -she has not required diuretic therapy   Medication Adjustments/Labs and Tests Ordered: Current medicines are reviewed at length with the patient today.  Concerns regarding medicines are outlined above.  No orders of the defined types were  placed in this encounter.  No orders of the defined types were placed in this encounter.   Signed, Fransico Him, MD  02/04/2021 1:23 PM    [place]

## 2021-02-05 LAB — BASIC METABOLIC PANEL
BUN/Creatinine Ratio: 18 (ref 12–28)
BUN: 17 mg/dL (ref 8–27)
CO2: 21 mmol/L (ref 20–29)
Calcium: 9.4 mg/dL (ref 8.7–10.3)
Chloride: 104 mmol/L (ref 96–106)
Creatinine, Ser: 0.92 mg/dL (ref 0.57–1.00)
Glucose: 98 mg/dL (ref 65–99)
Potassium: 4.4 mmol/L (ref 3.5–5.2)
Sodium: 142 mmol/L (ref 134–144)
eGFR: 63 mL/min/{1.73_m2} (ref >59)

## 2021-02-13 ENCOUNTER — Other Ambulatory Visit: Payer: Self-pay

## 2021-02-13 ENCOUNTER — Ambulatory Visit (INDEPENDENT_AMBULATORY_CARE_PROVIDER_SITE_OTHER)
Admission: RE | Admit: 2021-02-13 | Discharge: 2021-02-13 | Disposition: A | Discharge status: Home / Self Care | Payer: Medicare Other | Source: Ambulatory Visit | Attending: Cardiology | Admitting: Cardiology

## 2021-02-13 DIAGNOSIS — I712 Thoracic aortic aneurysm, without rupture, unspecified: Secondary | ICD-10-CM

## 2021-02-13 DIAGNOSIS — I728 Aneurysm of other specified arteries: Secondary | ICD-10-CM | POA: Diagnosis not present

## 2021-02-13 MED ORDER — IOHEXOL 350 MG/ML SOLN
100.0000 mL | Freq: Once | INTRAVENOUS | Status: AC | PRN
  Administered 2021-02-13: 100 mL via INTRAVENOUS

## 2021-02-26 ENCOUNTER — Other Ambulatory Visit: Payer: Self-pay | Admitting: Family Medicine

## 2021-02-26 DIAGNOSIS — Z1231 Encounter for screening mammogram for malignant neoplasm of breast: Secondary | ICD-10-CM

## 2021-03-13 DIAGNOSIS — M25512 Pain in left shoulder: Secondary | ICD-10-CM | POA: Diagnosis not present

## 2021-03-18 ENCOUNTER — Encounter: Payer: Self-pay | Admitting: Physician Assistant

## 2021-03-18 ENCOUNTER — Ambulatory Visit: Payer: Medicare Other | Admitting: Physician Assistant

## 2021-03-18 ENCOUNTER — Other Ambulatory Visit: Payer: Self-pay

## 2021-03-18 DIAGNOSIS — L570 Actinic keratosis: Secondary | ICD-10-CM | POA: Diagnosis not present

## 2021-03-18 DIAGNOSIS — L820 Inflamed seborrheic keratosis: Secondary | ICD-10-CM | POA: Diagnosis not present

## 2021-03-18 DIAGNOSIS — L739 Follicular disorder, unspecified: Secondary | ICD-10-CM | POA: Diagnosis not present

## 2021-03-18 MED ORDER — MUPIROCIN 2 % EX OINT: 1.0000 "application " | TOPICAL_OINTMENT | Freq: Two times a day (BID) | CUTANEOUS | 6 refills | Status: AC

## 2021-03-18 NOTE — Progress Notes (Signed)
   Follow-Up Visit   Subjective  Brittany Robertson is a 80 y.o. female who presents for the following: Follow-up (Patient here today for 3 month follow up. Per she has a new lesion on her right heel x several weeks per patient it's itching, no bleeding. Patient states that she also has a new lesion at her panty line x several weeks no bleeding. No history of atypical moles, melanoma or non mole skin cancer. ). Her chin has been very irritated by plucking and picking. She is getting bumps that won't go away. No treatment tried in the past.   The following portions of the chart were reviewed this encounter and updated as appropriate:  Tobacco  Allergies  Meds  Problems  Med Hx  Surg Hx  Fam Hx      Objective  Well appearing patient in no apparent distress; mood and affect are within normal limits.  A full examination was performed including scalp, head, eyes, ears, nose, lips, neck, chest, axillae, abdomen, back, buttocks, bilateral upper extremities, bilateral lower extremities, hands, feet, fingers, toes, fingernails, and toenails. All findings within normal limits unless otherwise noted below.  Objective  Right Abdomen (side) - Lower, Right Medial Heel: Erythematous stuck-on, crusty plaque.   Objective  Left Forehead: Erythematous patches with gritty scale.  Objective  Mid Submental Area: Perifollicular erythematous papules and pustules    Assessment & Plan  Seborrheic keratosis, inflamed (2) Right Abdomen (side) - Lower; Right Medial Heel  Destruction of lesion - Right Abdomen (side) - Lower, Right Medial Heel Complexity: simple   Destruction method: cryotherapy   Informed consent: discussed and consent obtained   Timeout:  patient name, date of birth, surgical site, and procedure verified Lesion destroyed using liquid nitrogen: Yes   Cryotherapy cycles:  3 Outcome: patient tolerated procedure well with no complications    AK (actinic keratosis) Left  Forehead  Destruction of lesion - Left Forehead Complexity: simple   Destruction method: cryotherapy   Informed consent: discussed and consent obtained   Timeout:  patient name, date of birth, surgical site, and procedure verified Lesion destroyed using liquid nitrogen: Yes   Cryotherapy cycles:  3 Outcome: patient tolerated procedure well with no complications    Folliculitis Mid Submental Area  mupirocin ointment (BACTROBAN) 2 % - Mid Submental Area    I, Allex Madia, PA-C, have reviewed all documentation's for this visit.  The documentation on 03/18/21 for the exam, diagnosis, procedures and orders are all accurate and complete.

## 2021-04-06 ENCOUNTER — Other Ambulatory Visit: Payer: Self-pay | Admitting: Cardiology

## 2021-04-08 DIAGNOSIS — M25512 Pain in left shoulder: Secondary | ICD-10-CM | POA: Diagnosis not present

## 2021-04-09 ENCOUNTER — Other Ambulatory Visit: Payer: Self-pay | Admitting: Cardiology

## 2021-04-14 DIAGNOSIS — M25512 Pain in left shoulder: Secondary | ICD-10-CM | POA: Diagnosis not present

## 2021-04-14 DIAGNOSIS — M75102 Unspecified rotator cuff tear or rupture of left shoulder, not specified as traumatic: Secondary | ICD-10-CM | POA: Insufficient documentation

## 2021-04-16 ENCOUNTER — Telehealth: Payer: Self-pay

## 2021-04-16 ENCOUNTER — Encounter: Payer: Self-pay | Admitting: *Deleted

## 2021-04-16 DIAGNOSIS — Z01818 Encounter for other preprocedural examination: Secondary | ICD-10-CM

## 2021-04-16 DIAGNOSIS — I2510 Atherosclerotic heart disease of native coronary artery without angina pectoris: Secondary | ICD-10-CM

## 2021-04-16 NOTE — Telephone Encounter (Signed)
Pt aware she will need a Lexiscan for pre op clearance. Order has been placed. Lattie Haw, scheduler for [employer] will call the pt and schedule testing. Lexiscan instructions have been sent through [place] for the pt to review. Nuc med dept will also call the pt and go over instructions as well. Attestation order has been placed and is awaiting signature from Dr. Radford Pax.

## 2021-04-16 NOTE — Telephone Encounter (Signed)
Patient notified and voiced understanding.

## 2021-04-16 NOTE — Telephone Encounter (Signed)
Per Dr. Radford Pax OK to hold ASA. She will need a lexiscan myoview for clearance. Can you please help set this up?

## 2021-04-16 NOTE — Telephone Encounter (Signed)
Dr. Radford Pax Pt has a history of CAD with last heart cath 2017 showing patent LAD stent. We have been asked for cardiac clearance and ASA hold for rotator cuff repair surgery. When speaking with her, she is unable to complete 4.0 METS due to degenerative disc disease. She states her chest pain is not linked to activity. You last saw her in April with atypical CP symptoms. Because she can't complete 4.0 METS, I'm unable to clear her with our typical protocol.   Can you clear her for surgery? OK to hold ASA 5-7 days prior to surgery?

## 2021-04-16 NOTE — Telephone Encounter (Signed)
Spoke with patient and informed her that the preop paperwork that she dropped off needs to picked up; because it has her health information on there as well as her surgery instructions that she need. I advised her that we have what we need off the form and once the pre-op provider reviews her chart and clearance is given then we will send the clearance letter over to Dr Lynwood Dawley office. She voiced understanding. She states she will pick up her paper work today.

## 2021-04-16 NOTE — Telephone Encounter (Signed)
   [facility] Pre-operative Risk Assessment    Patient Name: JOIA DOYLE  DOB: Oct 05, 1941  MRN: [id number]   HEARTCARE STAFF: - Please ensure there is not already an duplicate clearance open for this procedure. - Under Visit Info/Reason for Call, type in Other and utilize the format Clearance MM/DD/YY or Clearance TBD. Do not use dashes or single digits. - If request is for dental extraction, please clarify the # of teeth to be extracted. - If the patient is currently at the dentist's office, call Pre-Op APP to address. If the patient is not currently in the dentist office, please route to the Pre-Op pool  Request for surgical clearance:  What type of surgery is being performed? Left Rotator Cuff Surgery   When is this surgery scheduled? TBD   What type of clearance is required (medical clearance vs. Pharmacy clearance to hold med vs. Both)? Both  Are there any medications that need to be held prior to surgery and how long?Aspirin   Practice name and name of physician performing surgery? [facility]    Dr Hart Robinsons   What is the office phone number? [phone]  Caryl Pina Hilton-contact person)   7.   What is the office fax number? [phone]  8.   Anesthesia type (None, local, MAC, general) ? None Specified   Ulice Brilliant T 04/16/2021, 9:10 AM  _________________________________________________________________   (provider comments below)

## 2021-04-16 NOTE — Progress Notes (Signed)
LEXISCAN INSTRUCTIONS HAVE BEEN SENT THROUGH MY CHART FOR THE PT.

## 2021-04-17 NOTE — Telephone Encounter (Signed)
Shared Decision Making/Informed Consent The risks [chest pain, shortness of breath, cardiac arrhythmias, dizziness, blood pressure fluctuations, myocardial infarction, stroke/transient ischemic attack, nausea, vomiting, allergic reaction, radiation exposure, metallic taste sensation and life-threatening complications (estimated to be 1 in 10,000)], benefits (risk stratification, diagnosing coronary artery disease, treatment guidance) and alternatives of a nuclear stress test were discussed in detail with Brittany Robertson and she agrees to proceed.

## 2021-04-19 ENCOUNTER — Other Ambulatory Visit: Payer: Self-pay | Admitting: Cardiology

## 2021-04-23 ENCOUNTER — Other Ambulatory Visit: Payer: Self-pay

## 2021-04-23 ENCOUNTER — Ambulatory Visit
Admission: RE | Admit: 2021-04-23 | Discharge: 2021-04-23 | Disposition: A | Discharge status: Home / Self Care | Payer: Medicare Other | Source: Ambulatory Visit | Attending: Family Medicine | Admitting: Family Medicine

## 2021-04-23 DIAGNOSIS — Z1231 Encounter for screening mammogram for malignant neoplasm of breast: Secondary | ICD-10-CM | POA: Diagnosis not present

## 2021-04-28 ENCOUNTER — Telehealth (HOSPITAL_COMMUNITY): Payer: Self-pay

## 2021-04-28 NOTE — Telephone Encounter (Signed)
Spoke with the patient, detailed instructions given. She stated that she would be here for her test. Asked to call back with any questions. S.Cydne Grahn EMTP 

## 2021-05-01 ENCOUNTER — Other Ambulatory Visit: Payer: Self-pay

## 2021-05-01 ENCOUNTER — Ambulatory Visit (HOSPITAL_COMMUNITY): Payer: Medicare Other | Attending: Cardiology

## 2021-05-01 DIAGNOSIS — Z01810 Encounter for preprocedural cardiovascular examination: Secondary | ICD-10-CM

## 2021-05-01 DIAGNOSIS — I2510 Atherosclerotic heart disease of native coronary artery without angina pectoris: Secondary | ICD-10-CM | POA: Insufficient documentation

## 2021-05-01 DIAGNOSIS — Z01818 Encounter for other preprocedural examination: Secondary | ICD-10-CM

## 2021-05-01 LAB — MYOCARDIAL PERFUSION IMAGING
LV dias vol: 53 mL (ref 46–106)
LV sys vol: 16 mL
Peak HR: 81 {beats}/min
Rest HR: 50 {beats}/min
SDS: 2
SRS: 0
SSS: 2
TID: 0.94

## 2021-05-01 MED ORDER — TECHNETIUM TC 99M TETROFOSMIN IV KIT
10.3000 | PACK | Freq: Once | INTRAVENOUS | Status: AC | PRN
  Administered 2021-05-01: 10.3 via INTRAVENOUS
  Filled 2021-05-01: qty 11

## 2021-05-01 MED ORDER — TECHNETIUM TC 99M TETROFOSMIN IV KIT
30.9000 | PACK | Freq: Once | INTRAVENOUS | Status: AC | PRN
  Administered 2021-05-01: 30.9 via INTRAVENOUS
  Filled 2021-05-01: qty 31

## 2021-05-01 MED ORDER — REGADENOSON 0.4 MG/5ML IV SOLN
0.4000 mg | Freq: Once | INTRAVENOUS | Status: AC
  Administered 2021-05-01: 0.4 mg via INTRAVENOUS

## 2021-05-02 NOTE — Telephone Encounter (Signed)
    Patient Name: Brittany Robertson  DOB: 11-21-1940 MRN: [id number]  Primary Cardiologist: Fransico Him, MD  Chart reviewed as part of pre-operative protocol coverage. NST 05/01/21 was without ischemia and EF 70%, therefore JAI BEAR would be at acceptable risk for the planned procedure without further cardiovascular testing.   Per Dr. Radford Pax, okay to hold aspirin 5-7 days prior to her upcoming surgery with plans to restart when cleared to do so by her surgeon.  I will route this recommendation to the requesting party via Epic fax function and remove from pre-op pool.  Please call with questions.  Abigail Butts, PA-C 05/02/2021, 10:51 AM

## 2021-05-14 DIAGNOSIS — M8588 Other specified disorders of bone density and structure, other site: Secondary | ICD-10-CM | POA: Diagnosis not present

## 2021-05-15 DIAGNOSIS — M7552 Bursitis of left shoulder: Secondary | ICD-10-CM | POA: Diagnosis not present

## 2021-05-15 DIAGNOSIS — M75112 Incomplete rotator cuff tear or rupture of left shoulder, not specified as traumatic: Secondary | ICD-10-CM | POA: Diagnosis not present

## 2021-05-15 DIAGNOSIS — M19012 Primary osteoarthritis, left shoulder: Secondary | ICD-10-CM | POA: Diagnosis not present

## 2021-05-15 DIAGNOSIS — G8918 Other acute postprocedural pain: Secondary | ICD-10-CM | POA: Diagnosis not present

## 2021-05-15 DIAGNOSIS — M24112 Other articular cartilage disorders, left shoulder: Secondary | ICD-10-CM | POA: Diagnosis not present

## 2021-05-22 DIAGNOSIS — M25512 Pain in left shoulder: Secondary | ICD-10-CM | POA: Diagnosis not present

## 2021-05-27 DIAGNOSIS — R413 Other amnesia: Secondary | ICD-10-CM | POA: Diagnosis not present

## 2021-05-27 DIAGNOSIS — I25119 Atherosclerotic heart disease of native coronary artery with unspecified angina pectoris: Secondary | ICD-10-CM | POA: Diagnosis not present

## 2021-05-27 DIAGNOSIS — I110 Hypertensive heart disease with heart failure: Secondary | ICD-10-CM | POA: Diagnosis not present

## 2021-05-27 DIAGNOSIS — I5030 Unspecified diastolic (congestive) heart failure: Secondary | ICD-10-CM | POA: Diagnosis not present

## 2021-05-27 DIAGNOSIS — E782 Mixed hyperlipidemia: Secondary | ICD-10-CM | POA: Diagnosis not present

## 2021-05-27 DIAGNOSIS — R7301 Impaired fasting glucose: Secondary | ICD-10-CM | POA: Diagnosis not present

## 2021-05-27 DIAGNOSIS — M150 Primary generalized (osteo)arthritis: Secondary | ICD-10-CM | POA: Diagnosis not present

## 2021-05-27 DIAGNOSIS — M25512 Pain in left shoulder: Secondary | ICD-10-CM | POA: Diagnosis not present

## 2021-05-30 DIAGNOSIS — M25512 Pain in left shoulder: Secondary | ICD-10-CM | POA: Diagnosis not present

## 2021-06-03 DIAGNOSIS — M25512 Pain in left shoulder: Secondary | ICD-10-CM | POA: Diagnosis not present

## 2021-06-04 DIAGNOSIS — H35371 Puckering of macula, right eye: Secondary | ICD-10-CM | POA: Diagnosis not present

## 2021-06-04 DIAGNOSIS — H4301 Vitreous prolapse, right eye: Secondary | ICD-10-CM | POA: Diagnosis not present

## 2021-06-04 DIAGNOSIS — Z961 Presence of intraocular lens: Secondary | ICD-10-CM | POA: Diagnosis not present

## 2021-06-09 DIAGNOSIS — M25512 Pain in left shoulder: Secondary | ICD-10-CM | POA: Diagnosis not present

## 2021-06-12 DIAGNOSIS — M25512 Pain in left shoulder: Secondary | ICD-10-CM | POA: Diagnosis not present

## 2021-06-16 DIAGNOSIS — M25512 Pain in left shoulder: Secondary | ICD-10-CM | POA: Diagnosis not present

## 2021-06-19 DIAGNOSIS — M25512 Pain in left shoulder: Secondary | ICD-10-CM | POA: Diagnosis not present

## 2021-06-24 ENCOUNTER — Other Ambulatory Visit: Payer: Self-pay

## 2021-06-24 ENCOUNTER — Encounter: Payer: Self-pay | Admitting: Physician Assistant

## 2021-06-24 ENCOUNTER — Ambulatory Visit: Payer: Medicare Other | Admitting: Physician Assistant

## 2021-06-24 DIAGNOSIS — B079 Viral wart, unspecified: Secondary | ICD-10-CM

## 2021-06-24 NOTE — Progress Notes (Signed)
   Follow-Up Visit   Subjective  Brittany Robertson is a 80 y.o. female who presents for the following: Annual Exam (? Skin tag/ around neck- keeps hitting, new lesion on right upper lip.  No personal or family history of melanoma or non mole skin cancers. ).   The following portions of the chart were reviewed this encounter and updated as appropriate:      Objective  Well appearing patient in no apparent distress; mood and affect are within normal limits.  All skin waist up examined.  Chest - Medial (Center) (2), Right Instep (5) Verrucous papules -- Discussed viral etiology and contagion.   Assessment & Plan  Viral warts, unspecified type (7) Chest - Medial (Center) (2); Right Instep (5)  Destruction of lesion - Chest - Medial (Center), Right Instep Complexity: simple   Destruction method: cryotherapy   Informed consent: discussed and consent obtained   Timeout:  patient name, date of birth, surgical site, and procedure verified Lesion destroyed using liquid nitrogen: Yes   Cryotherapy cycles:  3 Outcome: patient tolerated procedure well with no complications   Post-procedure details: wound care instructions given     I, Savvy Peeters, PA-C, have reviewed all documentation's for this visit.  The documentation on 06/24/21 for the exam, diagnosis, procedures and orders are all accurate and complete.

## 2021-06-30 DIAGNOSIS — M25512 Pain in left shoulder: Secondary | ICD-10-CM | POA: Diagnosis not present

## 2021-07-09 DIAGNOSIS — M25512 Pain in left shoulder: Secondary | ICD-10-CM | POA: Diagnosis not present

## 2021-07-16 DIAGNOSIS — M25512 Pain in left shoulder: Secondary | ICD-10-CM | POA: Diagnosis not present

## 2021-07-18 DIAGNOSIS — M5412 Radiculopathy, cervical region: Secondary | ICD-10-CM | POA: Insufficient documentation

## 2021-08-05 DIAGNOSIS — M5416 Radiculopathy, lumbar region: Secondary | ICD-10-CM | POA: Diagnosis not present

## 2021-08-21 DIAGNOSIS — M5412 Radiculopathy, cervical region: Secondary | ICD-10-CM | POA: Diagnosis not present

## 2021-11-07 ENCOUNTER — Encounter: Payer: Self-pay | Admitting: Podiatry

## 2021-11-07 ENCOUNTER — Other Ambulatory Visit: Payer: Self-pay

## 2021-11-07 ENCOUNTER — Ambulatory Visit: Payer: Medicare Other | Admitting: Podiatry

## 2021-11-07 DIAGNOSIS — M79674 Pain in right toe(s): Secondary | ICD-10-CM | POA: Diagnosis not present

## 2021-11-07 DIAGNOSIS — B351 Tinea unguium: Secondary | ICD-10-CM | POA: Diagnosis not present

## 2021-11-07 NOTE — Progress Notes (Addendum)
This patient presents  to the office for evaluation and treatment of long thick painful nails .  This patient is unable to trim her own nails since the patient cannot reach her feet.  Patient says the nails are painful walking and wearing his shoes.  He returns for preventive foot care services.  General Appearance  Alert, conversant and in no acute stress.  Vascular  Dorsalis pedis and posterior tibial  pulses are palpable  bilaterally.  Capillary return is within normal limits  bilaterally. Temperature is within normal limits  bilaterally.  Neurologic  Senn-Weinstein monofilament wire test within normal limits  bilaterally. Muscle power within normal limits bilaterally.  Nails Thick disfigured discolored nails with subungual debris  3,4 and 5 toenails  right. No evidence of bacterial infection or drainage bilaterally.  Orthopedic  No limitations of motion  feet .  No crepitus or effusions noted.  No bony pathology or digital deformities noted. HAV  B/L.  Hammer toes 2-5  B/L.  Skin  normotropic skin with no porokeratosis noted bilaterally.  No signs of infections or ulcers noted.     Onychomycosis  Pain in toes right foot    Debridement  of nails  1-5  B/L with a nail nipper.  Nails were then filed using a dremel tool with no incidents.    RTC  4 months   Gardiner Barefoot DPM

## 2021-11-18 DIAGNOSIS — Z0000 Encounter for general adult medical examination without abnormal findings: Secondary | ICD-10-CM | POA: Diagnosis not present

## 2021-11-18 DIAGNOSIS — I110 Hypertensive heart disease with heart failure: Secondary | ICD-10-CM | POA: Diagnosis not present

## 2021-11-18 DIAGNOSIS — I25119 Atherosclerotic heart disease of native coronary artery with unspecified angina pectoris: Secondary | ICD-10-CM | POA: Diagnosis not present

## 2021-11-18 DIAGNOSIS — I714 Abdominal aortic aneurysm, without rupture, unspecified: Secondary | ICD-10-CM | POA: Diagnosis not present

## 2021-11-18 DIAGNOSIS — M8580 Other specified disorders of bone density and structure, unspecified site: Secondary | ICD-10-CM | POA: Diagnosis not present

## 2021-11-18 DIAGNOSIS — I5030 Unspecified diastolic (congestive) heart failure: Secondary | ICD-10-CM | POA: Diagnosis not present

## 2021-11-18 DIAGNOSIS — E782 Mixed hyperlipidemia: Secondary | ICD-10-CM | POA: Diagnosis not present

## 2021-11-18 DIAGNOSIS — R911 Solitary pulmonary nodule: Secondary | ICD-10-CM | POA: Diagnosis not present

## 2021-11-18 DIAGNOSIS — R634 Abnormal weight loss: Secondary | ICD-10-CM | POA: Diagnosis not present

## 2021-11-18 DIAGNOSIS — M150 Primary generalized (osteo)arthritis: Secondary | ICD-10-CM | POA: Diagnosis not present

## 2021-11-18 DIAGNOSIS — R7301 Impaired fasting glucose: Secondary | ICD-10-CM | POA: Diagnosis not present

## 2021-11-21 DIAGNOSIS — N200 Calculus of kidney: Secondary | ICD-10-CM | POA: Diagnosis not present

## 2021-11-21 DIAGNOSIS — R8279 Other abnormal findings on microbiological examination of urine: Secondary | ICD-10-CM | POA: Diagnosis not present

## 2021-12-23 ENCOUNTER — Ambulatory Visit: Payer: Medicare Other | Admitting: Physician Assistant

## 2021-12-23 ENCOUNTER — Other Ambulatory Visit: Payer: Self-pay

## 2021-12-23 DIAGNOSIS — L570 Actinic keratosis: Secondary | ICD-10-CM | POA: Diagnosis not present

## 2021-12-23 DIAGNOSIS — L309 Dermatitis, unspecified: Secondary | ICD-10-CM

## 2021-12-23 MED ORDER — BETAMETHASONE DIPROPIONATE 0.05 % EX OINT: TOPICAL_OINTMENT | Freq: Two times a day (BID) | CUTANEOUS | 3 refills | Status: AC | PRN

## 2021-12-23 MED ORDER — BETAMETHASONE DIPROPIONATE 0.05 % EX OINT: TOPICAL_OINTMENT | Freq: Every day | CUTANEOUS | 3 refills | Status: DC

## 2021-12-24 ENCOUNTER — Telehealth: Payer: Self-pay

## 2021-12-24 DIAGNOSIS — L309 Dermatitis, unspecified: Secondary | ICD-10-CM

## 2021-12-24 MED ORDER — TRIAMCINOLONE ACETONIDE 0.1 % EX CREA: 1.0000 "application " | TOPICAL_CREAM | Freq: Every day | CUTANEOUS | 11 refills | Status: AC

## 2021-12-24 NOTE — Telephone Encounter (Signed)
Phone call from patient stating that she seen [facility] yesterday and wanted a refill on a cream that she couldn't think of the name of. Patient states that the name of the cream is Triamcinolone Cream. Patient wanted to know if Robyne Askew would be okay with refilling the cream? I spoke with Robyne Askew regarding the cream and she gave a verbal for the refill. Refill sent to patient's Pharmacy.  ?

## 2021-12-26 ENCOUNTER — Encounter: Payer: Self-pay | Admitting: Physician Assistant

## 2021-12-26 NOTE — Progress Notes (Signed)
? ?  Follow-Up Visit ?  ?Subjective  ?Brittany Robertson is a 81 y.o. female who presents for the following: Follow-up (Patient here today for 6 month follow up. Per patient she has several itchy rough lesions x 1 months on both legs and left ankle. Per patient she's used cortisone that has kept the lesions from itching. ). ? ? ?The following portions of the chart were reviewed this encounter and updated as appropriate:  Tobacco  Allergies  Meds  Problems  Med Hx  Surg Hx  Fam Hx   ?  ? ?Objective  ?Well appearing patient in no apparent distress; mood and affect are within normal limits. ? ?A full examination was performed including scalp, head, eyes, ears, nose, lips, neck, chest, axillae, abdomen, back, buttocks, bilateral upper extremities, bilateral lower extremities, hands, feet, fingers, toes, fingernails, and toenails. All findings within normal limits unless otherwise noted below. ? ?Left Lower Leg - Anterior, Right Lower Leg - Anterior ?Erythematous patches with gritty scale. ? ?Left Lower Leg - Anterior, Left Medial Thigh, Left Thigh - Anterior, Right Lower Leg - Anterior, Right Thigh - Anterior ?Thin scaly erythematous papules coalescing to plaques.  ? ? ?Assessment & Plan  ?Actinic keratosis (2) ?Left Lower Leg - Anterior; Right Lower Leg - Anterior ? ?Destruction of lesion - Left Lower Leg - Anterior, Right Lower Leg - Anterior ?Complexity: simple   ?Destruction method: cryotherapy   ?Informed consent: discussed and consent obtained   ?Timeout:  patient name, date of birth, surgical site, and procedure verified ?Lesion destroyed using liquid nitrogen: Yes   ?Cryotherapy cycles:  1 ?Outcome: patient tolerated procedure well with no complications   ?Post-procedure details: wound care instructions given   ? ?Dermatitis ?Left Thigh - Anterior; Right Thigh - Anterior; Left Lower Leg - Anterior; Right Lower Leg - Anterior; Left Medial Thigh ? ?betamethasone dipropionate (DIPROLENE) 0.05 % ointment - Left  Lower Leg - Anterior, Left Medial Thigh, Left Thigh - Anterior, Right Lower Leg - Anterior, Right Thigh - Anterior ?Apply topically 2 (two) times daily as needed (Rash). ? ? ? ?I, Ezrah Panning, PA-C, have reviewed all documentation's for this visit.  The documentation on 12/26/21 for the exam, diagnosis, procedures and orders are all accurate and complete. ?

## 2022-01-02 DIAGNOSIS — N3020 Other chronic cystitis without hematuria: Secondary | ICD-10-CM | POA: Diagnosis not present

## 2022-01-22 DIAGNOSIS — M5416 Radiculopathy, lumbar region: Secondary | ICD-10-CM | POA: Diagnosis not present

## 2022-02-05 DIAGNOSIS — M5412 Radiculopathy, cervical region: Secondary | ICD-10-CM | POA: Diagnosis not present

## 2022-03-11 ENCOUNTER — Ambulatory Visit: Payer: Medicare Other | Admitting: Podiatry

## 2022-03-11 DIAGNOSIS — M79674 Pain in right toe(s): Secondary | ICD-10-CM

## 2022-03-11 DIAGNOSIS — B351 Tinea unguium: Secondary | ICD-10-CM

## 2022-03-11 NOTE — Progress Notes (Signed)
This patient presents  to the office for evaluation and treatment of long thick painful nails .  This patient is unable to trim her own nails since the patient cannot reach her feet.  Patient says the nails are painful walking and wearing his shoes.  He returns for preventive foot care services.  General Appearance  Alert, conversant and in no acute stress.  Vascular  Dorsalis pedis and posterior tibial  pulses are palpable  bilaterally.  Capillary return is within normal limits  bilaterally. Temperature is within normal limits  bilaterally.  Neurologic  Senn-Weinstein monofilament wire test within normal limits  bilaterally. Muscle power within normal limits bilaterally.  Nails Thick disfigured discolored nails with subungual debris  3,4 and 5 toenails  right. No evidence of bacterial infection or drainage bilaterally.  Orthopedic  No limitations of motion  feet .  No crepitus or effusions noted.  No bony pathology or digital deformities noted. HAV  B/L.  Hammer toes 2-5  B/L.  Skin  normotropic skin with no porokeratosis noted bilaterally.  No signs of infections or ulcers noted.     Onychomycosis  Pain in toes right foot    Debridement  of nails  1-5  B/L with a nail nipper.  Nails were then filed using a dremel tool with no incidents.    RTC  3  months   Gardiner Barefoot DPM

## 2022-03-23 ENCOUNTER — Other Ambulatory Visit: Payer: Self-pay | Admitting: Family Medicine

## 2022-03-23 DIAGNOSIS — Z1231 Encounter for screening mammogram for malignant neoplasm of breast: Secondary | ICD-10-CM

## 2022-04-01 DIAGNOSIS — N3020 Other chronic cystitis without hematuria: Secondary | ICD-10-CM | POA: Diagnosis not present

## 2022-04-08 DIAGNOSIS — M5412 Radiculopathy, cervical region: Secondary | ICD-10-CM | POA: Diagnosis not present

## 2022-04-08 DIAGNOSIS — M5030 Other cervical disc degeneration, unspecified cervical region: Secondary | ICD-10-CM | POA: Diagnosis not present

## 2022-04-08 DIAGNOSIS — M5136 Other intervertebral disc degeneration, lumbar region: Secondary | ICD-10-CM | POA: Diagnosis not present

## 2022-04-08 DIAGNOSIS — M5416 Radiculopathy, lumbar region: Secondary | ICD-10-CM | POA: Diagnosis not present

## 2022-04-20 ENCOUNTER — Ambulatory Visit: Payer: Medicare Other | Admitting: Internal Medicine

## 2022-04-22 ENCOUNTER — Encounter: Payer: Self-pay | Admitting: Internal Medicine

## 2022-04-22 ENCOUNTER — Ambulatory Visit: Payer: Medicare Other | Admitting: Internal Medicine

## 2022-04-22 VITALS — BP 134/74 | HR 53 | Temp 98.2°F | Ht 64.0 in | Wt 210.0 lb

## 2022-04-22 DIAGNOSIS — R0982 Postnasal drip: Secondary | ICD-10-CM | POA: Diagnosis not present

## 2022-04-22 DIAGNOSIS — R0989 Other specified symptoms and signs involving the circulatory and respiratory systems: Secondary | ICD-10-CM | POA: Diagnosis not present

## 2022-04-22 DIAGNOSIS — R053 Chronic cough: Secondary | ICD-10-CM

## 2022-04-22 DIAGNOSIS — J329 Chronic sinusitis, unspecified: Secondary | ICD-10-CM | POA: Diagnosis not present

## 2022-04-22 LAB — CBC WITH DIFFERENTIAL/PLATELET
Basophils Absolute: 0.1 10*3/uL (ref 0.0–0.1)
Basophils Relative: 1.2 % (ref 0.0–3.0)
Eosinophils Absolute: 0.3 10*3/uL (ref 0.0–0.7)
Eosinophils Relative: 4.3 % (ref 0.0–5.0)
HCT: 38 % (ref 36.0–46.0)
Hemoglobin: 12.6 g/dL (ref 12.0–15.0)
Lymphocytes Relative: 25.8 % (ref 12.0–46.0)
Lymphs Abs: 1.6 10*3/uL (ref 0.7–4.0)
MCHC: 33.1 g/dL (ref 30.0–36.0)
MCV: 97.3 fl (ref 78.0–100.0)
Monocytes Absolute: 0.8 10*3/uL (ref 0.1–1.0)
Monocytes Relative: 12.3 % — ABNORMAL HIGH (ref 3.0–12.0)
Neutro Abs: 3.6 10*3/uL (ref 1.4–7.7)
Neutrophils Relative %: 56.4 % (ref 43.0–77.0)
Platelets: 183 10*3/uL (ref 150.0–400.0)
RBC: 3.9 Mil/uL (ref 3.87–5.11)
RDW: 13.9 % (ref 11.5–15.5)
WBC: 6.3 10*3/uL (ref 4.0–10.5)

## 2022-04-22 NOTE — Patient Instructions (Addendum)
ICD-10-CM   1. Chronic cough  R05.3     2. Chronic sinusitis, unspecified location  J32.9     3. Post-nasal drip  R09.82     4. Chronic throat clearing  R09.89       Cough could be from  from sinus drainage, acid reflux, asthma or none of these All of this might  working together to cause cyclical cough/LPR cough   Plan  - get cbc with diff, RAST allergy panel - get HRCT supine and prone - get CT sinus without contrast  -get full PFT  -   #Followup - APP in 2-5 weeks but after completing above

## 2022-04-22 NOTE — Progress Notes (Signed)
Subjective:     Patient ID: Brittany Robertson, female   DOB: 1941-01-05, 81 y.o.   MRN: [id number]  HPI  PCP Mayra Neer, MD   HPI   IOV 06/29/2016  Chief Complaint  Patient presents with   Pulmonary Consult    Pt referred by Dr. Fransico Him for abnormal PFT. Pt c/o DOE with little activity, dry cough and left upper chest pain when SOB. Pt saw cardiology in 04/2016 and did stress test and echo.      [name] is a 81 y.o. female who is a resident of [place]. She presents with a husband because of insidious onset of shortness of breath for last 9 months. She tells me that in 2005 she had similar shortness of breath and at that time had coronary stent placed and shortness of breath resolved. Then this time dyspnea has recurred some 6-9 months ago. Since then it is been progressive. It is a moderate intensity. It is associated with a mild dry cough. This also progressive. She went to cardiologist Dr. Radford Pax who did cardiac stress test 05/14/2016 and this was low risk. Patient then had pulmonary function test which showed isolated reduction in diffusion capacity of 14.85/61%. Spirometry and total lung capacity normal. I personally visualized the trace. Chest x-ray done 05/25/2016 that I personally visualized shows possibility of interstitial lung disease. She does have a CT chest in 2012 done during the time of trauma that shows some infiltrates. She and her husband now wants this evaluated. Most recent hemoglobin 04/24/2016 is normal at 13.1 g percent. Creatinine is normal at 0.76 mg percent.    OV 08/10/2016  Chief Complaint  Patient presents with   Follow-up    Pt here after HRCT and CPST. Pt states she is still SOB, no change. Pt c/o occassional dry cough and upper left chest pain. Pt denies f/c/s and swelling.    Follow-up dyspnea  Here with her husband review high-resolution CT chest documented below and pulmonary stress test done 07/29/2016. Pulmonary stress test  results reviewed. She was wheezing at the outset. She had dyspnea then exercise. She given adequate exercise. Etiology of dyspnea is likely obesity and possible diastolic dysfunction but mostly obesity. This is based on Ve. there was no evidence of exercise-induced spasm. Today we did exhaled nitric oxide and this was normal at 10. In discussing the results husband reported that several years ago she had upper endoscopy for a sensation of frog in the throat and was referred to Dr. Janace Hoard and ENT and something is found in the vocal . He also admits to significant snoring of the wife but no nighttime apnea spells. There is a definite fatigue but no excessive daytime somnolence. They're wondering about possible sleep apnea.      IMPRESSION: 1. No evidence of interstitial lung disease. 2. Right middle lobe 4 mm solid pulmonary nodule, new since 09/27/2011. No follow-up needed if patient is low-risk. Non-contrast chest CT can be considered in 12 months if patient is high-risk. This recommendation follows the consensus statement: Guidelines for Management of Incidental Pulmonary Nodules Detected on CT Images: From the Fleischner Society 2017; Radiology 2017; 284:228-243. 3. 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 4. Aortic atherosclerosis. 5. **An incidental finding of potential clinical significance has been found. Ascending thoracic aortic 4.2 cm aneurysm, new since 2012. Recommend annual imaging followup by CTA or MRA. This recommendation follows 2010 ACCF/AHA/AATS/ACR/ASA/SCA/SCAI/SIR/STS/SVM Guidelines for the Diagnosis and Management of Patients with Thoracic Aortic Disease.  Circulation. 2010; 121: J093-O671. ** 6. Left main and 3 vessel coronary atherosclerosis.   Electronically Signed   By: Ilona Sorrel M.D.   On: 07/03/2016 10:38      OV 02/15/17  Chief Complaint  Patient presents with   Follow-up    Pt states she is doing well since starting pulmonary rehab. Pt  denies cough, CP/tightness, cough.    Follow-up dyspnea due to obesity and diastolic dysfunction: She attended pulmonary habitation and this helped significantly. Now doing yard work. She and her husband are thankful. A few months ago she did have the flu was given Z-Pak. She did have some pleuritic chest pain at that time. It is now resolved. She feels better than ever before.  Follow-up right middle lobe lung nodule.: She had questions about this. She is due for annual CT scan later this year in September 2018. Explained low risk for lung cancer.     OV 04/22/2022 -greater than 3 years since her prior visit so this new office visit  Subjective:  Patient ID: Brittany Robertson, female , DOB: 07-Jun-1941 , age 76 y.o. , MRN: [id number] , ADDRESS: [address] [id number] [place] [address] PCP Mayra Neer, MD Patient Care Team: Mayra Neer, MD as PCP - General (Family Medicine) Sueanne Margarita, MD as PCP - Cardiology (Cardiology) Warren Danes, PA-C as Physician Assistant (Dermatology)  This Provider for this visit: Treatment Team:  Attending Provider: Brand Males, MD    04/22/2022 -   Chief Complaint  Patient presents with   Consult    Pt states she has a hacking cough and clearing her throat for years. Denies SOB.     HPI [name] 81 y.o. -new consult for chronic cough.  Presents with husband who is getting most of the history.  Since I last saw her several years ago husband states that she has some short-term memory issues.  She also is insomnia along with 5-6 episodes of nocturia at night.  Decreased ambulation because of DJD and uses a cane.  Previously 5 years ago she did pulmonary rehab but now she cannot do it for her limited activity she does not have shortness of breath.  With the main issues that she has chronic cough.  Present for at least 1 year.  Husband believes it is getting worse with the patient states it is stable.  Within the husband says short-term memory  issues make it difficult for her to recollect.  Described as moderate.  They deny any dysphagia although the question they noticed that there is some dysphagia for pills.  There is no wheezing.  Shortness of breath is undetermined.  However there is a lot of postnasal drainage she is using a lot of Kleenex.  She is on antihypertensive Benicar.  It is mostly dry.  Daytime worse the night but when she gets up to go to the bathroom at night she does cough.  There is no known acid reflux issues.  There is no known wheezing.  Last pulmonary function test 2017 April 2022 CT angiogram chest does not describe her previous nodule.  Specifically noted that no pulmonary issues but her cough is started since then.      Dr Lorenza Cambridge Reflu - x Symptom Index (> 13-15 suggestive of LPR cough) 0 -> 5  =  none ->severe problem 04/22/2022   Hoarseness of problem with voice 0  Clearing  Of Throat 2  Excess throat mucus or feeling of post nasal drip  2  Difficulty swallowing food, liquid or tablets 0  Cough after eating or lying down 2 pills  Breathing difficulties or choking episodes 0  Troublesome or annoying cough 2  Sensation of something sticking in throat or lump in throat 0  Heartburn, chest pain, indigestion, or stomach acid coming up 1  TOTAL 9     CT Chest data April 2022 from CT angio  IMPRESSION: 1. Stable ascending thoracic aortic aneurysm measuring a maximum of 3.9 mm. No dissection. 2. Stable coronary artery calcifications. 3. No acute pulmonary findings or worrisome pulmonary lesions. 4. Aortic atherosclerosis.   Aortic Atherosclerosis (ICD10-I70.0).     Electronically Signed   By: Marijo Sanes M.D.   On: 02/14/2021 14:40    No results found.    PFT     Latest Ref Rng & Units 05/25/2016    9:31 AM  PFT Results  FVC-Pre L 2.59   FVC-Predicted Pre % 94   FVC-Post L 2.68   FVC-Predicted Post % 98   Pre FEV1/FVC % % 79   Post FEV1/FCV % % 84   FEV1-Pre L 2.05    FEV1-Predicted Pre % 100   FEV1-Post L 2.24   DLCO uncorrected ml/min/mmHg 14.85   DLCO UNC% % 61   DLVA Predicted % 78   TLC L 4.71   TLC % Predicted % 93   RV % Predicted % 80        has a past medical history of Ascending aortic aneurysm (HCC), Bruises easily, Chronic diastolic CHF (congestive heart failure) ([place]), Complication of anesthesia, Coronary artery disease, DDD (degenerative disc disease), cervical, Dilated aortic root (HCC), Dysrhythmia, GERD (gastroesophageal reflux disease), History of kidney stones, Hyperlipidemia, Hypertension, Left ureteral stone, OA (osteoarthritis), Obesity, Osteopenia, PONV (postoperative nausea and vomiting), PVC's (premature ventricular contractions), and SOB (shortness of breath) (07/13/2016).   reports that she has never smoked. She has been exposed to tobacco smoke. She has never used smokeless tobacco.  Past Surgical History:  Procedure Laterality Date   ABDOMINAL HYSTERECTOMY     APPENDECTOMY  1957   arthroscopic knee  05/2002,04/07/2005   right and then also torn muscle surgery on right knee (2003(   arthroscopic knee  01/02/2005,09/21/2006   left   CARDIAC CATHETERIZATION N/A 08/31/2016   Procedure: Right/Left Heart Cath and Coronary Angiography;  Surgeon: Sherren Mocha, MD;  Location: [facility];  Service: Cardiovascular;  Laterality: N/A;   CHOLECYSTECTOMY  1988   COLONOSCOPY  2010   with Endo   CORONARY ANGIOPLASTY  02/05/2004   CYST REMOVAL HAND  2000   CYSTOSCOPY/URETEROSCOPY/HOLMIUM LASER/STENT PLACEMENT Left 08/26/2018   Procedure: CYSTOSCOPY/RETROGRADE/URETEROSCOPY/HOLMIUM LASER/ STONE BASKETRY/ STENT PLACEMENT;  Surgeon: Ceasar Mons, MD;  Location: [facility];  Service: Urology;  Laterality: Left;  ONLY NEEDS  45 MIN   CYSTOSCOPY/URETEROSCOPY/HOLMIUM LASER/STENT PLACEMENT Bilateral 09/17/2020   Procedure: CYSTOSCOPY/URETEROSCOPY/HOLMIUM LASER/STENT PLACEMENT;  Surgeon: Ceasar Mons, MD;  Location: [facility];  Service: Urology;  Laterality: Bilateral;   DILATION AND CURETTAGE OF UTERUS  1972   EXTRACORPOREAL SHOCK WAVE LITHOTRIPSY Right 12/12/2018   Procedure: EXTRACORPOREAL SHOCK WAVE LITHOTRIPSY (ESWL);  Surgeon: Ceasar Mons, MD;  Location: [facility];  Service: Urology;  Laterality: Right;   EYE SURGERY  01/2007   bilateral cataract with lens implant   FOOT SURGERY  01/2012   left and right bunion and hammer toe   HAND SURGERY  05/29/2011   joint removed   [place]  HERNIA REPAIR  02/2010   incisional hernioa   KNEE ARTHROSCOPY     LEFT HEART CATHETERIZATION WITH CORONARY ANGIOGRAM N/A 09/19/2013   Procedure: LEFT HEART CATHETERIZATION WITH CORONARY ANGIOGRAM;  Surgeon: Sueanne Margarita, MD;  Location: [facility];  Service: Cardiovascular;  Laterality: N/A;   neck injections  03/2014   back and neck injections from Dr. Nelva Bush   stents     x1   tear duct surgery  1990's   bilateral   TONSILLECTOMY     as child   TOTAL KNEE ARTHROPLASTY Left 01/04/2015   Procedure: LEFT TOTAL KNEE ARTHROPLASTY;  Surgeon: Sydnee Cabal, MD;  Location: [facility];  Service: Orthopedics;  Laterality: Left;   TOTAL KNEE ARTHROPLASTY Right 05/10/2015   Procedure: TOTAL RIGHT KNEE ARTHROPLASTY;  Surgeon: Sydnee Cabal, MD;  Location: [facility];  Service: Orthopedics;  Laterality: Right;    Allergies  Allergen Reactions   Codeine Nausea And Vomiting   Sulfa Antibiotics Hives and Itching   Sulfasalazine Itching and Hives   Adhesive [Tape] Itching, Rash and Other (See Comments)    Skin irritation   Atorvastatin Rash and Other (See Comments)    GENERIC causes severe skin rash but can take Brand Name   Other Rash    Surgical tape   Penicillins Hives, Itching and Rash    Has patient had a PCN reaction causing immediate rash, facial/tongue/throat swelling, SOB or lightheadedness with hypotension: Yes Has patient had a PCN reaction causing severe rash  involving mucus membranes or skin necrosis: Yes Has patient had a PCN reaction that required hospitalization No Has patient had a PCN reaction occurring within the last 10 years: No If all of the above answers are "NO", then may proceed with Cephalosporin use.     Immunization History  Administered Date(s) Administered   Influenza, High Dose Seasonal PF 06/29/2016   Influenza,inj,Quad PF,6+ Mos 05/20/2015   Pneumococcal Conjugate-13 10/19/2013   Unspecified SARS-COV-2 Vaccination 02/14/2021    Family History  Problem Relation Age of Onset   Hypertension Mother    Cancer Mother    Hypertension Father    Heart Problems Father      Current Outpatient Medications:    acetaminophen (TYLENOL) 500 MG tablet, Take 500-1,000 mg by mouth every 6 (six) hours as needed for moderate pain or headache. , Disp: , Rfl:    aspirin 81 MG tablet, Take 81 mg by mouth every evening. , Disp: , Rfl:    betamethasone dipropionate (DIPROLENE) 0.05 % ointment, Apply topically 2 (two) times daily as needed (Rash)., Disp: 45 g, Rfl: 3   cetirizine (ZYRTEC) 10 MG tablet, Take 10 mg by mouth every morning., Disp: , Rfl:    cholecalciferol (VITAMIN D) 400 UNITS TABS, Take 400 Units by mouth 2 (two) times daily., Disp: , Rfl:    denosumab (PROLIA) 60 MG/ML SOLN injection, Inject 60 mg into the skin every 6 (six) months. Administer in upper arm, thigh, or abdomen, Disp: , Rfl:    docusate sodium (COLACE) 100 MG capsule, Take 100 mg by mouth 2 (two) times daily., Disp: , Rfl:    ezetimibe (ZETIA) 10 MG tablet, TAKE 1 TABLET BY MOUTH IN  THE EVENING, Disp: 90 tablet, Rfl: 3   famotidine (PEPCID) 20 MG tablet, Take one tablet by mouth daily as needed, Disp: 90 tablet, Rfl: 3   fluocinonide gel (LIDEX) 0.05 %, Apply topically., Disp: , Rfl:    FLUZONE HIGH-DOSE QUADRIVALENT 0.7 ML SUSY, , Disp: , Rfl:  Melatonin 5 MG CAPS, Take 5 mg by mouth at bedtime., Disp: , Rfl:    meloxicam (MOBIC) 15 MG tablet, Take 15 mg by  mouth every evening., Disp: , Rfl:    methocarbamol (ROBAXIN) 500 MG tablet, Take 500 mg by mouth 4 (four) times daily as needed for muscle spasms (cramps)., Disp: , Rfl:    metoprolol succinate (TOPROL-XL) 25 MG 24 hr tablet, TAKE ONE-HALF TABLET BY  MOUTH DAILY, Disp: 45 tablet, Rfl: 3   mupirocin ointment (BACTROBAN) 2 %, Apply 1 application topically 2 (two) times daily., Disp: 22 g, Rfl: 6   MYRBETRIQ 50 MG TB24 tablet, Take 50 mg by mouth at bedtime., Disp: , Rfl:    nitroGLYCERIN (NITROSTAT) 0.4 MG SL tablet, Place 1 tablet (0.4 mg total) under the tongue every 5 (five) minutes as needed. As needed for chest pain x 3 doses (Patient taking differently: Place 0.4 mg under the tongue every 5 (five) minutes as needed for chest pain.), Disp: 25 tablet, Rfl: 0   olmesartan (BENICAR) 40 MG tablet, TAKE 1 TABLET BY MOUTH  DAILY, Disp: 90 tablet, Rfl: 3   PFIZER COVID-19 VAC BIVALENT injection, , Disp: , Rfl:    polyethylene glycol (MIRALAX / GLYCOLAX) 17 g packet, Take 17 g by mouth daily as needed., Disp: , Rfl:    Probiotic Product (PROBIOTIC-10 PO), Take 1 tablet by mouth daily at 6 (six) AM., Disp: , Rfl:    rosuvastatin (CRESTOR) 20 MG tablet, Take 20 mg by mouth daily., Disp: , Rfl:    sodium chloride (OCEAN) 0.65 % SOLN nasal spray, Place 1 spray into both nostrils as needed for congestion., Disp: , Rfl:    triamcinolone cream (KENALOG) 0.1 %, Apply 1 application. topically daily., Disp: 80 g, Rfl: 11   Vitamin D, Cholecalciferol, 10 MCG (400 UNIT) CAPS, Take 400 Int'l Units/1.38m by mouth every Monday, Wednesday, and Friday., Disp: , Rfl:    clindamycin (CLEOCIN) 300 MG capsule, Take 300 mg by mouth every 6 (six) hours., Disp: , Rfl:    furosemide (LASIX) 20 MG tablet, TAKE 1 TABLET BY MOUTH  DAILY AS NEEDED FOR FLUID  OR EDEMA (Patient not taking: Reported on 04/22/2022), Disp: 90 tablet, Rfl: 3   Menthol, Topical Analgesic, (ICY HOT) 16 % LIQD, Apply topically. (Patient not taking: Reported on  04/22/2022), Disp: , Rfl:       Objective:   Vitals:   04/22/22 0936  BP: 134/74  Pulse: (!) 53  Temp: 98.2 F (36.8 C)  TempSrc: Oral  SpO2: 97%  Weight: 210 lb (95.3 kg)  Height: '5\' 4"'$  (1.626 m)    Estimated body mass index is 36.05 kg/m as calculated from the following:   Height as of this encounter: '5\' 4"'$  (1.626 m).   Weight as of this encounter: 210 lb (95.3 kg).  '@WEIGHTCHANGE'$ @  [facility]  04/22/22 0936  Weight: 210 lb (95.3 kg)     Physical Exam    General: No distress. Obese sitting with cane Neuro: Alert and Oriented x 3. GCS 15. Speech normal Psych: Pleasant Resp:  Barrel Chest - no.  Wheeze - no, Crackles - no, No overt respiratory distress CVS: Normal heart sounds. Murmurs - no Ext: Stigmata of Connective Tissue Disease - no HEENT: Normal upper airway. PEERL +. No post nasal drip        Assessment:       ICD-10-CM   1. Chronic cough  R05.3 CBC with Differential/Platelet    Allergen  Panel (27) + IGE    CT Chest High Resolution    CT Maxillofacial LTD WO CM    Pulmonary Function Test    2. Chronic sinusitis, unspecified location  J32.9 CBC with Differential/Platelet    Allergen Panel (27) + IGE    CT Chest High Resolution    CT Maxillofacial LTD WO CM    Pulmonary Function Test    3. Post-nasal drip  R09.82 CBC with Differential/Platelet    Allergen Panel (27) + IGE    CT Chest High Resolution    CT Maxillofacial LTD WO CM    Pulmonary Function Test    4. Chronic throat clearing  R09.89          Plan:     Patient Instructions     ICD-10-CM   1. Chronic cough  R05.3     2. Chronic sinusitis, unspecified location  J32.9     3. Post-nasal drip  R09.82     4. Chronic throat clearing  R09.89       Cough could be from  from sinus drainage, acid reflux, asthma or none of these All of this might  working together to cause cyclical cough/LPR cough   Plan  - get cbc with diff, RAST allergy panel - get HRCT supine and  prone - get CT sinus without contrast  -get full PFT  -   #Followup - APP in 2-5 weeks but after completing above     SIGNATURE    Dr. Brand Males, M.D., F.C.C.P,  Pulmonary and Critical Care Medicine Staff Physician, [place] Director - Interstitial Lung Disease  Program  [facility] [place], [place], [address]  Pager: [phone], If no answer or between  15:00h - 7:00h: call [phone] Telephone: [phone]  10:26 AM 04/22/2022

## 2022-04-24 ENCOUNTER — Ambulatory Visit
Admission: RE | Admit: 2022-04-24 | Discharge: 2022-04-24 | Disposition: A | Discharge status: Home / Self Care | Payer: Medicare Other | Source: Ambulatory Visit | Attending: Family Medicine | Admitting: Family Medicine

## 2022-04-24 DIAGNOSIS — Z1231 Encounter for screening mammogram for malignant neoplasm of breast: Secondary | ICD-10-CM

## 2022-04-25 LAB — ALLERGEN PANEL (27) + IGE
Alternaria Alternata IgE: <0.1 kU/L
Aspergillus Fumigatus IgE: <0.1 kU/L
Bahia Grass IgE: <0.1 kU/L
Bermuda Grass IgE: <0.1 kU/L
Cat Dander IgE: <0.1 kU/L
Cedar, Mountain IgE: <0.1 kU/L
Cladosporium Herbarum IgE: <0.1 kU/L
Cocklebur IgE: <0.1 kU/L
Cockroach, American IgE: <0.1 kU/L
Common Silver Birch IgE: <0.1 kU/L
D Farinae IgE: <0.1 kU/L
D Pteronyssinus IgE: <0.1 kU/L
Dog Dander IgE: <0.1 kU/L
Elm, American IgE: <0.1 kU/L
Hickory, White IgE: <0.1 kU/L
IgE (Immunoglobulin E), Serum: 3 IU/mL — ABNORMAL LOW (ref 6–495)
Johnson Grass IgE: <0.1 kU/L
Kentucky Bluegrass IgE: <0.1 kU/L
Maple/Box Elder IgE: <0.1 kU/L
Mucor Racemosus IgE: <0.1 kU/L
Oak, White IgE: <0.1 kU/L
Penicillium Chrysogen IgE: <0.1 kU/L
Pigweed, Rough IgE: <0.1 kU/L
Plantain, English IgE: <0.1 kU/L
Ragweed, Short IgE: <0.1 kU/L
Setomelanomma Rostrat: <0.1 kU/L
Timothy Grass IgE: <0.1 kU/L
White Mulberry IgE: <0.1 kU/L

## 2022-05-12 DIAGNOSIS — M5412 Radiculopathy, cervical region: Secondary | ICD-10-CM | POA: Diagnosis not present

## 2022-05-19 ENCOUNTER — Ambulatory Visit
Admission: RE | Admit: 2022-05-19 | Discharge: 2022-05-19 | Disposition: A | Discharge status: Home / Self Care | Payer: Medicare Other | Source: Ambulatory Visit | Attending: Internal Medicine | Admitting: Internal Medicine

## 2022-05-19 DIAGNOSIS — Z8709 Personal history of other diseases of the respiratory system: Secondary | ICD-10-CM | POA: Diagnosis not present

## 2022-05-19 DIAGNOSIS — I728 Aneurysm of other specified arteries: Secondary | ICD-10-CM | POA: Diagnosis not present

## 2022-05-19 DIAGNOSIS — J342 Deviated nasal septum: Secondary | ICD-10-CM | POA: Diagnosis not present

## 2022-05-19 DIAGNOSIS — R053 Chronic cough: Secondary | ICD-10-CM

## 2022-05-19 DIAGNOSIS — R0982 Postnasal drip: Secondary | ICD-10-CM | POA: Diagnosis not present

## 2022-05-19 DIAGNOSIS — R059 Cough, unspecified: Secondary | ICD-10-CM | POA: Diagnosis not present

## 2022-05-19 DIAGNOSIS — J329 Chronic sinusitis, unspecified: Secondary | ICD-10-CM

## 2022-05-19 DIAGNOSIS — I700 Atherosclerosis of aorta: Secondary | ICD-10-CM | POA: Diagnosis not present

## 2022-05-23 IMAGING — CT CT ABD-PEL WO/W CM
3 of 9 series · 13 of 46 positions shown, 19 images · IV contrast (iopamidol)
Comparison: 12/28/2019

CLINICAL DATA: Left-sided abdominal pain for 6-7 months, nausea,
alternating bowel movements

EXAM:
CT ABDOMEN AND PELVIS WITHOUT AND WITH CONTRAST
TECHNIQUE: Multidetector CT imaging of the abdomen and pelvis was performed
following the standard protocol before and following the bolus
administration of intravenous contrast.
CONTRAST:  100mL [id number] IOPAMIDOL ([id number]) INJECTION 61%,
additional oral enteric contrast

[Series 2: abd pelvis pre 5.00 br40 s3 axial · axial · non-contrast · 0.88mm/px · z∈[+1262,+1317]mm · 2 of 86 slices shown]
[im 11/86  soft-tissue]
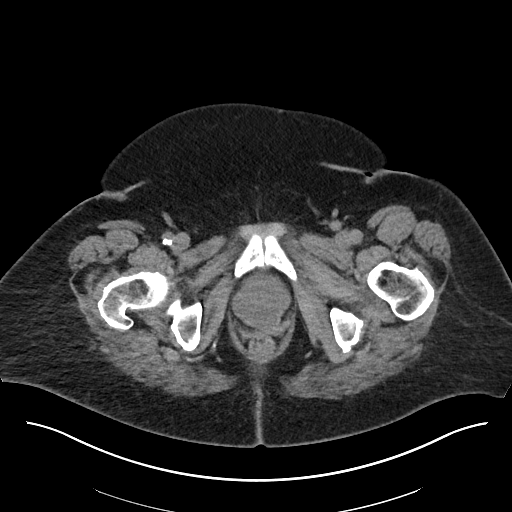
[im 22/86  soft-tissue]
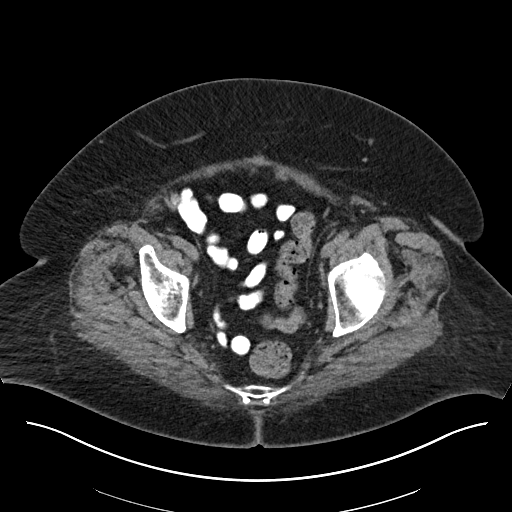

[Series 6: abd pelvis pre 2.00 br40 s3 cor · coronal · non-contrast · 0.84mm/px · 3 of 201 slices shown, 4 images]
[im 51/201  soft-tissue]
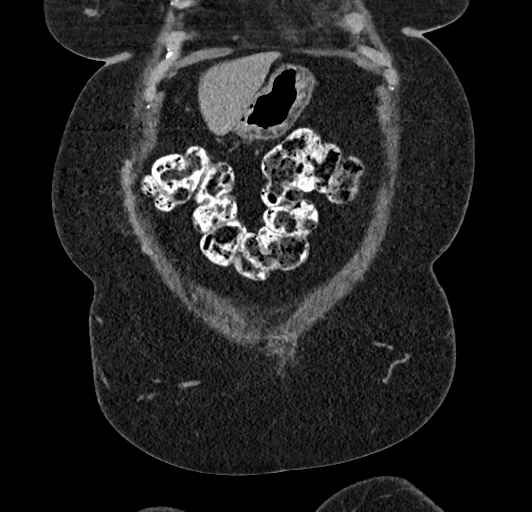
[im 101/201  soft-tissue]
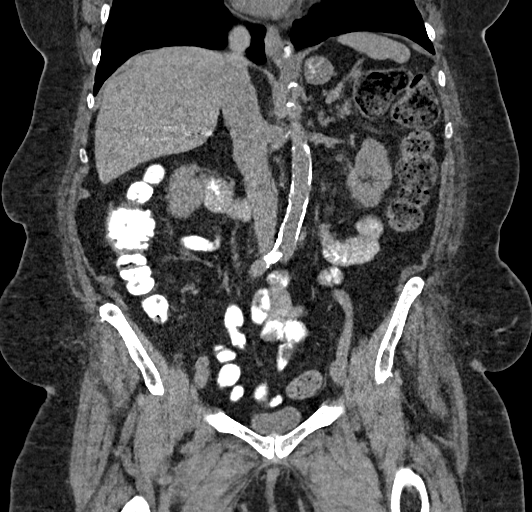
[im 101/201  bone]
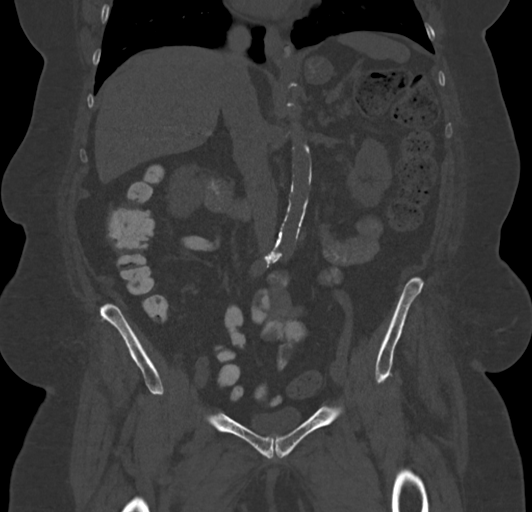
[im 151/201  soft-tissue]
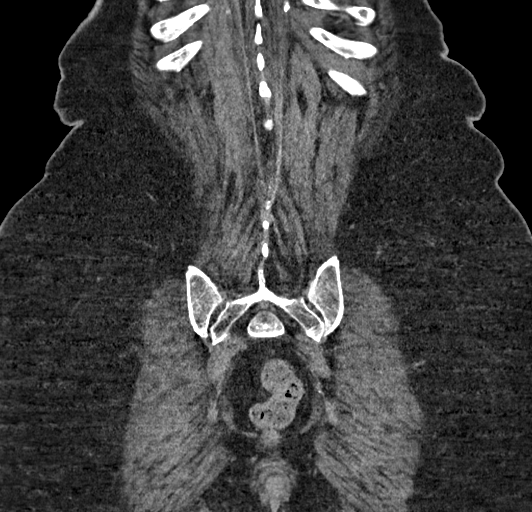

[Series 11: abd pelvis post 5.00 br40 s3 axial · axial · 0.88mm/px · z∈[+1257,+1587]mm · 8 of 86 slices shown, 13 images]
[im 10/86  soft-tissue]
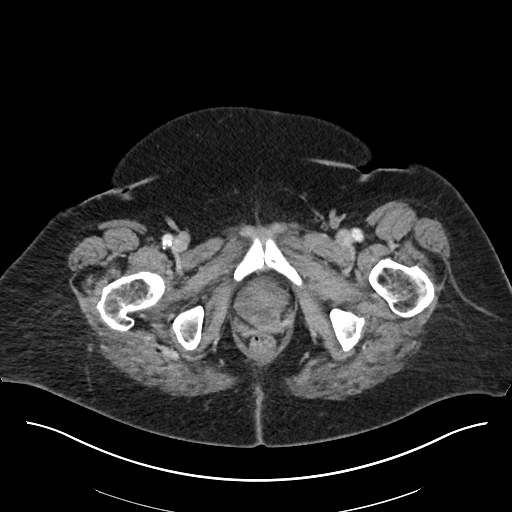
[im 10/86  bone]
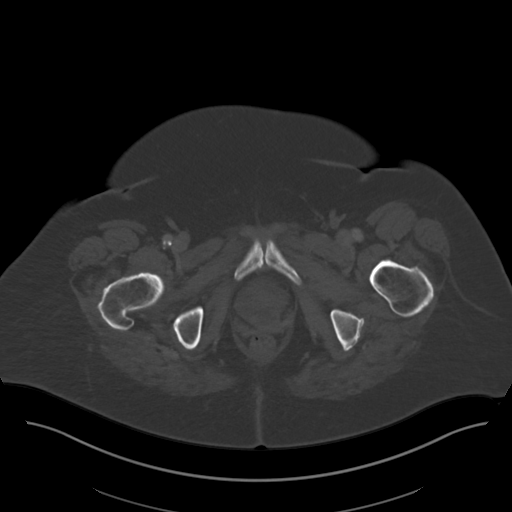
[im 19/86  soft-tissue]
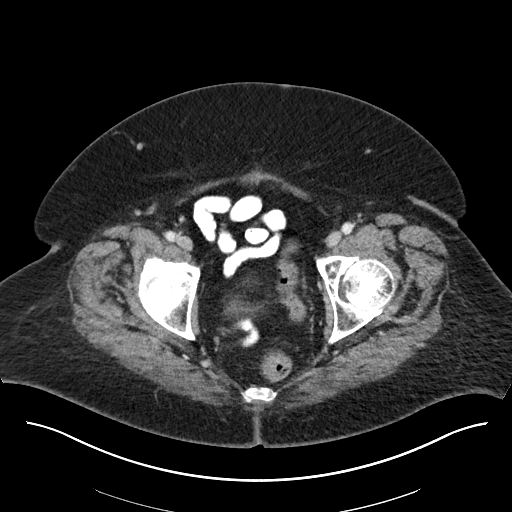
[im 29/86  soft-tissue]
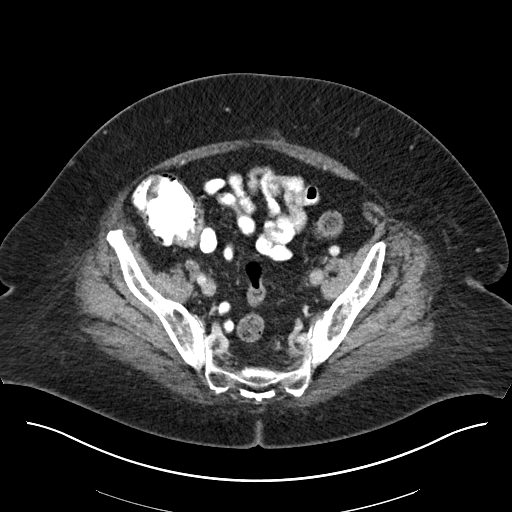
[im 38/86  soft-tissue]
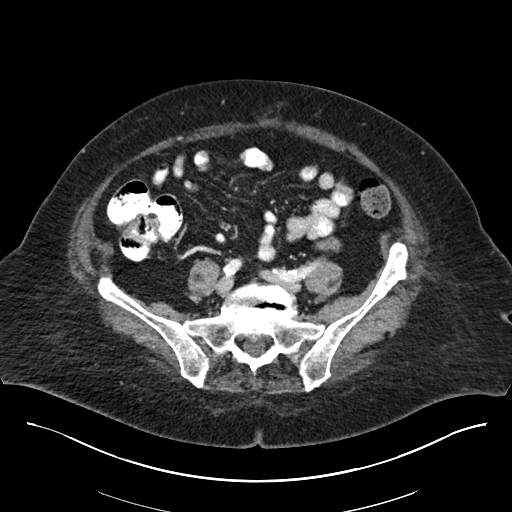
[im 48/86  soft-tissue]
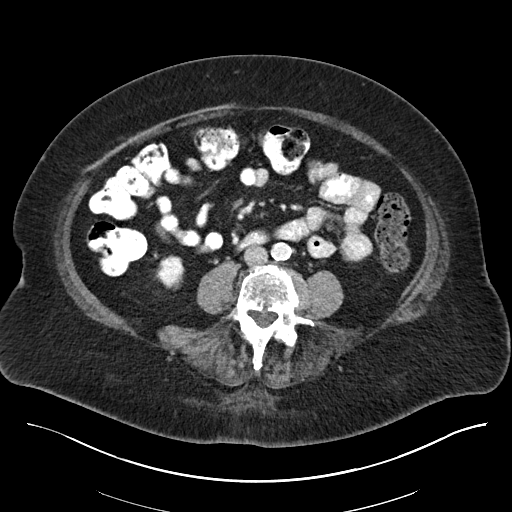
[im 48/86  lung]
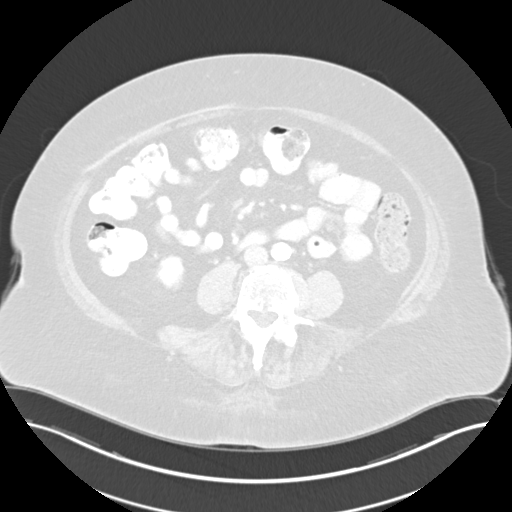
[im 57/86  soft-tissue]
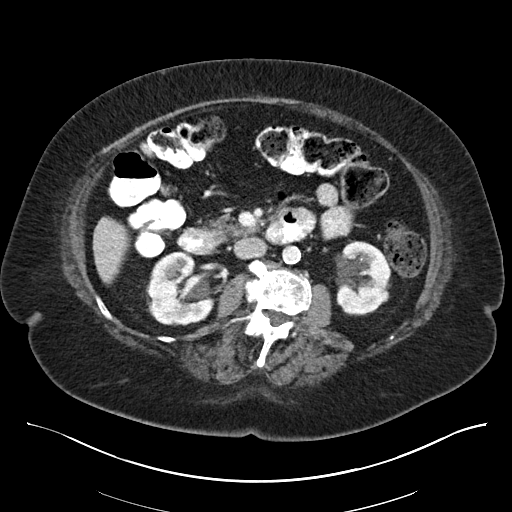
[im 57/86  lung]
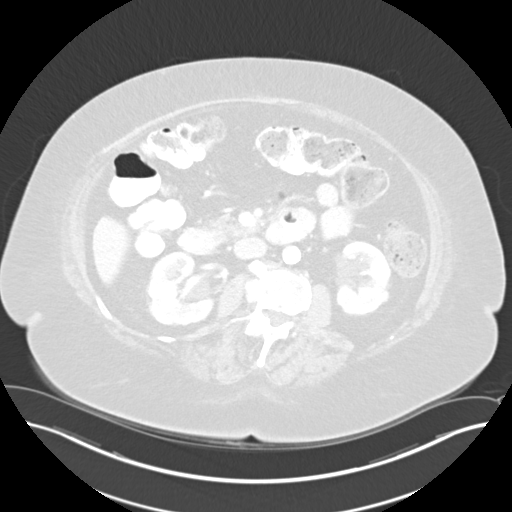
[im 67/86  soft-tissue]
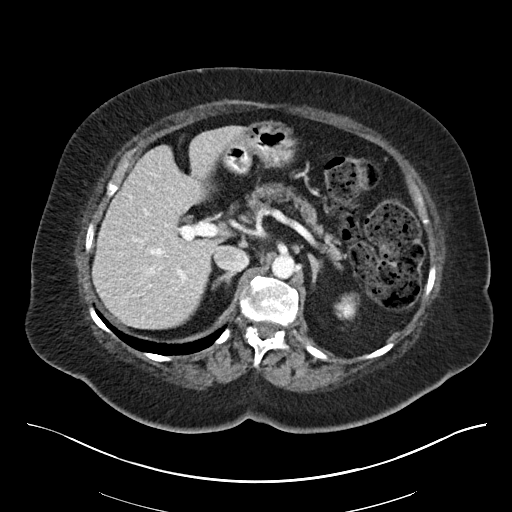
[im 67/86  lung]
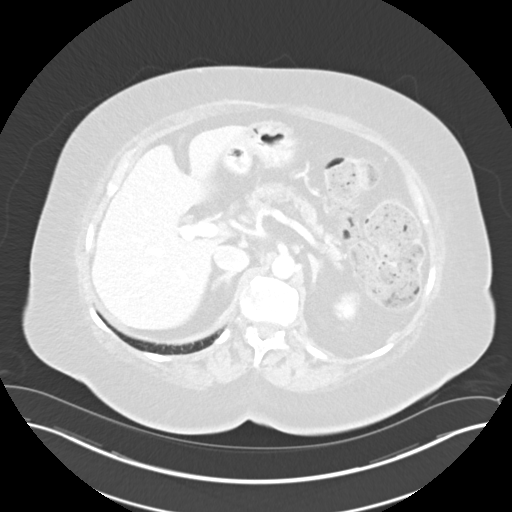
[im 76/86  soft-tissue]
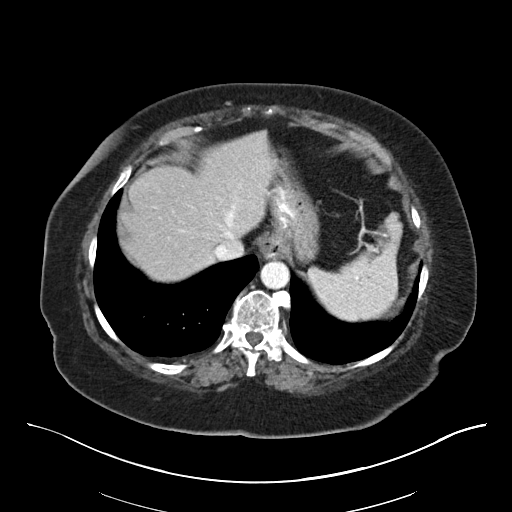
[im 76/86  lung]
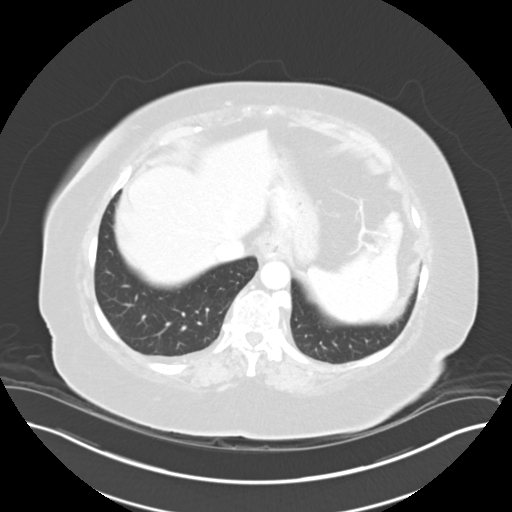

[13 of 46 positions shown; findings below may reference images not displayed]

FINDINGS: Lower chest: No acute abnormality.  Small hiatal hernia.

Hepatobiliary: No focal liver abnormality is seen. Status post
cholecystectomy. No biliary dilatation.

Pancreas: Unremarkable. No pancreatic ductal dilatation or
surrounding inflammatory changes.

Spleen: Normal in size without significant abnormality.

Adrenals/Urinary Tract: Adrenal glands are unremarkable. Small
bilateral nonobstructive renal calculi. No hydronephrosis. Multiple
parapelvic cysts. Fat containing benign angiomyolipoma of the
inferior pole of the right kidney. Bladder is unremarkable.

Stomach/Bowel: Stomach is within normal limits. Appendix appears
normal. No evidence of bowel wall thickening, distention, or
inflammatory changes.

Vascular/Lymphatic: Aortic atherosclerosis. Redemonstrated densely
calcified aneurysm of the proximal splenic artery measuring 1.8 x
1.3 cm (series 11, image 18). No enlarged abdominal or pelvic lymph
nodes.

Reproductive: Status post hysterectomy.

Other: No abdominal wall hernia or abnormality. No abdominopelvic
ascites.

Musculoskeletal: No acute or significant osseous findings.
IMPRESSION: 1. No CT findings of the abdomen or pelvis to explain left-sided
abdominal pain.
2. Small bilateral nonobstructive renal calculi. No hydronephrosis.
3. Redemonstrated densely calcified aneurysm of the proximal splenic
artery measuring 1.8 x 1.3 cm.
4. Aortic Atherosclerosis ([id number].X).

## 2022-05-26 DIAGNOSIS — M5416 Radiculopathy, lumbar region: Secondary | ICD-10-CM | POA: Diagnosis not present

## 2022-05-28 NOTE — Progress Notes (Signed)
IMPRESSION: Clear sinuses and nasal cavity.   Electronically Signed   By: Jorje Guild M.D.   On: 05/19/2022 18:56

## 2022-05-31 NOTE — Progress Notes (Unsigned)
Cardiology Office Note:    Date:  06/03/2022   ID:  [name], DOB September 14, 1941, MRN [id number]  PCP:  Mayra Neer, MD   [facility] Providers Cardiologist:  Fransico Him, MD     Referring MD: Mayra Neer, MD   Chief Complaint: follow-up CAD  History of Present Illness:    Brittany Robertson is a very pleasant 81 y.o. female with a hx of ASCAD s/p PCI of LAD 2006 with repeat cath in 2014 showing 30% mid LAD distal to the stent and otherwise normal coronary arteries and repeat heart cath 08/2016 showed normal coronary arteries with patent LAD stent and normal LVEDP. Chronic as of breath felt to be due to obesity and deconditioning, HTN, HLD, chronic HFpEF, obesity, and mild ascending aortic dilatation.  She was last seen in our office on 02/04/2021 by Dr. Radford Pax at which time she described vague tightness in upper chest nothing like her typical angina, nonexertional, not felt to be cardiac in origin.  Was to notify us if symptoms worsened.  CTA revealed stable ascending thoracic aortic aneurysm measuring a maximum of 3.9 mm without dissection aortic atherosclerosis.  We received request cardiac clearance 04/16/2021 for shoulder surgery. She underwent lexiscan myoview for clearance which did not show ischemia, EF 70% and clearance was provided to requesting surgeon.   Review of recent notes from pulmonology for follow-up of chronic cough reveals air trapping on HRCT indicative of small airways disease, splenic artery aneurysm similar in comparison to previous. Placced on ICS/LABA for mild persistent asthma and postnasal drip and congestion, placed on Flornase, and chlorpheniramine.  Today, she is here with her husband. Reports she is doing well but was unable to have successful left shoulder surgery due to a problem with her tissue not being strong enough for stitches. Reports an occasional sharp pain that lasts a few seconds, most commonly occurs when getting up from lying or  sitting. Pain does not worsen with activity.  Feels an occasional flutter, states these do not occur very often.  She does not consistently monitor BP but there have been no reported concerns from PCP or other providers. Husband asks about olmesartan causing cough. Advised them to try inhaler as prescribed by pulmonology and report to [place] if cough does not improve.  She denies shortness of breath, lower extremity edema, fatigue, melena, hemoptysis, diaphoresis, weakness, presyncope, syncope, orthopnea, and PND. Activity is limited by back pain. Does limited shopping and gardening. She uses a cane and a rollator walker on occasion.   Past Medical History:  Diagnosis Date   Ascending aortic aneurysm ([place])    42m 07/2018   Bruises easily    Chronic diastolic CHF (congestive heart failure) (HCC)    Complication of anesthesia    "takes a long time to wake up"bp little low when wakes up   Coronary artery disease    PCI 2006, repeat cath 30% mid LAD distal to stent otherwise normal coronary arteries, cath 08/2013 with patent LAD stent and otherwise normal coronary arteries with diastolic dysfunction. Cath 08/2016 with normal RHC, normal LVEDP, patent LAD stent with otherwise normal coronary arteries   DDD (degenerative disc disease), cervical    Dilated aortic root (HCC)    470mby echo 07/2017   Dysrhythmia    GERD (gastroesophageal reflux disease)    History of kidney stones    Hyperlipidemia    Hypertension    Left ureteral stone    OA (osteoarthritis)    Obesity  Osteopenia    PONV (postoperative nausea and vomiting)    Motion sickness when move out of PACU to room   PVC's (premature ventricular contractions)    SOB (shortness of breath) 07/13/2016   non cardiac and secondary to deconditioning and obesity    Past Surgical History:  Procedure Laterality Date   ABDOMINAL HYSTERECTOMY     APPENDECTOMY  1957   arthroscopic knee  05/2002,04/07/2005   right and then also torn muscle  surgery on right knee (2003(   arthroscopic knee  01/02/2005,09/21/2006   left   CARDIAC CATHETERIZATION N/A 08/31/2016   Procedure: Right/Left Heart Cath and Coronary Angiography;  Surgeon: Sherren Mocha, MD;  Location: [facility];  Service: Cardiovascular;  Laterality: N/A;   CHOLECYSTECTOMY  1988   COLONOSCOPY  2010   with Endo   CORONARY ANGIOPLASTY  02/05/2004   CYST REMOVAL HAND  2000   CYSTOSCOPY/URETEROSCOPY/HOLMIUM LASER/STENT PLACEMENT Left 08/26/2018   Procedure: CYSTOSCOPY/RETROGRADE/URETEROSCOPY/HOLMIUM LASER/ STONE BASKETRY/ STENT PLACEMENT;  Surgeon: Ceasar Mons, MD;  Location: [facility];  Service: Urology;  Laterality: Left;  ONLY NEEDS  45 MIN   CYSTOSCOPY/URETEROSCOPY/HOLMIUM LASER/STENT PLACEMENT Bilateral 09/17/2020   Procedure: CYSTOSCOPY/URETEROSCOPY/HOLMIUM LASER/STENT PLACEMENT;  Surgeon: Ceasar Mons, MD;  Location: [facility];  Service: Urology;  Laterality: Bilateral;   DILATION AND CURETTAGE OF UTERUS  1972   EXTRACORPOREAL SHOCK WAVE LITHOTRIPSY Right 12/12/2018   Procedure: EXTRACORPOREAL SHOCK WAVE LITHOTRIPSY (ESWL);  Surgeon: Ceasar Mons, MD;  Location: [facility];  Service: Urology;  Laterality: Right;   EYE SURGERY  01/2007   bilateral cataract with lens implant   FOOT SURGERY  01/2012   left and right bunion and hammer toe   HAND SURGERY  05/29/2011   joint removed   [place]  02/2010   incisional hernioa   KNEE ARTHROSCOPY     LEFT HEART CATHETERIZATION WITH CORONARY ANGIOGRAM N/A 09/19/2013   Procedure: LEFT HEART CATHETERIZATION WITH CORONARY ANGIOGRAM;  Surgeon: Sueanne Margarita, MD;  Location: [facility];  Service: Cardiovascular;  Laterality: N/A;   neck injections  03/2014   back and neck injections from Dr. Nelva Bush   stents     x1   tear duct surgery  1990's   bilateral   TONSILLECTOMY     as child   TOTAL KNEE ARTHROPLASTY Left 01/04/2015   Procedure:  LEFT TOTAL KNEE ARTHROPLASTY;  Surgeon: Sydnee Cabal, MD;  Location: [facility];  Service: Orthopedics;  Laterality: Left;   TOTAL KNEE ARTHROPLASTY Right 05/10/2015   Procedure: TOTAL RIGHT KNEE ARTHROPLASTY;  Surgeon: Sydnee Cabal, MD;  Location: [facility];  Service: Orthopedics;  Laterality: Right;    Current Medications: Current Meds  Medication Sig   acetaminophen (TYLENOL) 500 MG tablet Take 500-1,000 mg by mouth every 6 (six) hours as needed for moderate pain or headache.    aspirin 81 MG tablet Take 81 mg by mouth every evening.    betamethasone dipropionate (DIPROLENE) 0.05 % ointment Apply topically 2 (two) times daily as needed (Rash).   budesonide-formoterol (SYMBICORT) 80-4.5 MCG/ACT inhaler Inhale 2 puffs into the lungs in the morning and at bedtime.   cetirizine (ZYRTEC) 10 MG tablet Take 10 mg by mouth every morning.   cholecalciferol (VITAMIN D) 400 UNITS TABS Take 400 Units by mouth 2 (two) times daily.   clindamycin (CLEOCIN) 300 MG capsule Take 300 mg by mouth every 6 (six) hours.   denosumab (PROLIA) 60 MG/ML SOLN injection  Inject 60 mg into the skin every 6 (six) months. Administer in upper arm, thigh, or abdomen   docusate sodium (COLACE) 100 MG capsule Take 100 mg by mouth 2 (two) times daily.   ezetimibe (ZETIA) 10 MG tablet TAKE 1 TABLET BY MOUTH IN  THE EVENING   famotidine (PEPCID) 20 MG tablet Take one tablet by mouth daily as needed   fluocinonide gel (LIDEX) 0.05 % Apply topically.   fluticasone (FLONASE) 50 MCG/ACT nasal spray Place 2 sprays into both nostrils daily.   FLUZONE HIGH-DOSE QUADRIVALENT 0.7 ML SUSY    furosemide (LASIX) 20 MG tablet TAKE 1 TABLET BY MOUTH  DAILY AS NEEDED FOR FLUID  OR EDEMA   Melatonin 5 MG CAPS Take 5 mg by mouth at bedtime.   meloxicam (MOBIC) 15 MG tablet Take 15 mg by mouth every evening.   Menthol, Topical Analgesic, (ICY HOT) 16 % LIQD Apply topically.   methocarbamol (ROBAXIN) 500 MG tablet Take 500 mg by mouth 4 (four) times  daily as needed for muscle spasms (cramps).   metoprolol succinate (TOPROL-XL) 25 MG 24 hr tablet TAKE ONE-HALF TABLET BY  MOUTH DAILY   mupirocin ointment (BACTROBAN) 2 % Apply 1 application topically 2 (two) times daily.   MYRBETRIQ 50 MG TB24 tablet Take 50 mg by mouth at bedtime.   nitroGLYCERIN (NITROSTAT) 0.4 MG SL tablet Place 1 tablet (0.4 mg total) under the tongue every 5 (five) minutes as needed. As needed for chest pain x 3 doses (Patient taking differently: Place 0.4 mg under the tongue every 5 (five) minutes as needed for chest pain.)   olmesartan (BENICAR) 40 MG tablet TAKE 1 TABLET BY MOUTH  DAILY   polyethylene glycol (MIRALAX / GLYCOLAX) 17 g packet Take 17 g by mouth daily as needed.   Probiotic Product (PROBIOTIC-10 PO) Take 1 tablet by mouth daily at 6 (six) AM.   rosuvastatin (CRESTOR) 20 MG tablet Take 20 mg by mouth daily.   sodium chloride (OCEAN) 0.65 % SOLN nasal spray Place 1 spray into both nostrils as needed for congestion.   triamcinolone cream (KENALOG) 0.1 % Apply 1 application. topically daily.   Vitamin D, Cholecalciferol, 10 MCG (400 UNIT) CAPS Take 400 Int'l Units/1.35m by mouth every Monday, Wednesday, and Friday.     Allergies:   Codeine, Sulfa antibiotics, Sulfasalazine, Adhesive [tape], Atorvastatin, Other, and Penicillins   Social History   Socioeconomic History   Marital status: Married    Spouse name: Not on file   Number of children: Not on file   Years of education: Not on file   Highest education level: Not on file  Occupational History   Not on file  Tobacco Use   Smoking status: Never    Passive exposure: Past   Smokeless tobacco: Never  Vaping Use   Vaping Use: Never used  Substance and Sexual Activity   Alcohol use: Yes    Alcohol/week: 1.0 standard drink of alcohol    Types: 1 Glasses of wine per week    Comment: occassionally   Drug use: No   Sexual activity: Not on file  Other Topics Concern   Not on file  Social History  Narrative   Not on file   Social Determinants of Health   Financial Resource Strain: Not on file  Food Insecurity: Not on file  Transportation Needs: Not on file  Physical Activity: Not on file  Stress: Not on file  Social Connections: Not on file  Family History: The patient's family history includes Cancer in her mother; Heart Problems in her father; Hypertension in her father and mother. There is no history of Breast cancer.  ROS:   Please see the history of present illness.   + occasional sharp chest pain + occasional palpitation All other systems reviewed and are negative.  Labs/Other Studies Reviewed:    The following studies were reviewed today:  Lexiscan myoview 05/01/21  The left ventricular ejection fraction is hyperdynamic (>65%). Nuclear stress EF: 70%. There was no ST segment deviation noted during stress. The study is normal. This is a low risk study.   Normal resting and stress perfusion. No ischemia or infarction EF 70%  CTA Chest 02/14/21  IMPRESSION: 1. Stable ascending thoracic aortic aneurysm measuring a maximum of 3.9 mm. No dissection. 2. Stable coronary artery calcifications. 3. No acute pulmonary findings or worrisome pulmonary lesions. 4. Aortic atherosclerosis.   Aortic Atherosclerosis (ICD10-I70.0).   Echo 08/09/18  - Left ventricle: The cavity size was normal. Wall thickness was    increased in a pattern of mild LVH. Systolic function was normal.    The estimated ejection fraction was in the range of 60% to 65%.    Wall motion was normal; there were no regional wall motion    abnormalities. Features are consistent with a pseudonormal left    ventricular filling pattern, with concomitant abnormal relaxation    and increased filling pressure (grade 2 diastolic dysfunction).  - Aortic valve: There was trivial regurgitation.   R/LHC 08/2016   1. Continued patency of the stented segment in the LAD 2. Patent coronary arteries with  minimal irregularity 3. Normal LV systolic function with normal LVEDP 4. Normal right heart pressures   Suspect noncardiac symptoms  Recent Labs: 04/22/2022: Hemoglobin 12.6; Platelets 183.0  Recent Lipid Panel    Component Value Date/Time   CHOL 102 07/05/2019 0730   TRIG 103 07/05/2019 0730   HDL 48 07/05/2019 0730   CHOLHDL 2.1 07/05/2019 0730   CHOLHDL 2.1 04/24/2016 0850   VLDL 26 04/24/2016 0850   LDLCALC 35 07/05/2019 0730     Risk Assessment/Calculations:         Physical Exam:    VS:  BP 122/74   Pulse (!) 54   Ht '5\' 4"'$  (1.626 m)   Wt 208 lb (94.3 kg)   SpO2 98%   BMI 35.70 kg/m     Wt Readings from Last 3 Encounters:  06/03/22 208 lb (94.3 kg)  06/01/22 208 lb (94.3 kg)  04/22/22 210 lb (95.3 kg)     GEN:  Well developed, obese female in no acute distress HEENT: Normal NECK: No JVD; No carotid bruits CARDIAC: RRR, no murmurs, rubs, gallops RESPIRATORY:  Clear to auscultation without rales, wheezing or rhonchi  ABDOMEN: Soft, non-tender, non-distended MUSCULOSKELETAL:  No edema; No deformity. 2+ pedal pulses, equal bilaterally SKIN: Warm and dry NEUROLOGIC:  Alert and oriented x 3 PSYCHIATRIC:  Normal affect   EKG:  EKG is ordered today.  The ekg ordered today demonstrates sinus bradycardia at 53 bpm, LAD, no acute ST/T abnormality  Diagnoses:    1. Aneurysm of ascending aorta without rupture ([place])   2. Essential hypertension   3. Coronary artery disease involving native coronary artery of native heart without angina pectoris   4. Hyperlipidemia LDL goal <70    Assessment and Plan:     CAD without angina: Remote history of PCI to LAD. She describes occasional sharp chest pains that  do not sound consistent with angina. No dyspnea. Nuclear stress test 04/2021 without evidence of ischemia, normal EF. No indication for additional ischemic evaluation at this time. No bleeding problems on aspirin.   Hypertension: BP is well-controlled.  She does not  monitor on a consistent basis.  I have asked her to monitor routinely and report any abnormalities to [place]. Discussion of potentially holding olmesartan in the future to see if it is contributing to cough. Patient is to let us know if cough does not improve on inhaler.  Continue olmesartan, metoprolol,  Lasix. We will get bmet today.   Ascending aortic aneurysm: Stable on last imaging. Maximum diameter of ascending thoracic aorta 3.9 mm by CTA 01/2021. We will repeat CTA for evaluation.  We will get bmet for evaluation of kidney function prior to CT.  Hyperlipidemia LDL goal < 70: LDL 34 on 11/18/21. Excellent control. Continue ezetimibe and rosuvastatin.   Disposition: 1 year with Dr. Radford Pax     Medication Adjustments/Labs and Tests Ordered: Current medicines are reviewed at length with the patient today.  Concerns regarding medicines are outlined above.  Orders Placed This Encounter  Procedures   CT ANGIO CHEST AORTA W/CM & OR WO/CM   Basic Metabolic Panel (BMET)   EKG 12-Lead   No orders of the defined types were placed in this encounter.   Patient Instructions  Medication Instructions:  Your physician recommends that you continue on your current medications as directed. Please refer to the Current Medication list given to you today. *If you need a refill on your cardiac medications before your next appointment, please call your pharmacy*   Lab Work: TODAY-BMET If you have labs (blood work) drawn today and your tests are completely normal, you will receive your results only by: [place] (if you have MyChart) OR A paper copy in the mail If you have any lab test that is abnormal or we need to change your treatment, we will call you to review the results.   Testing/Procedures: CT ANGIO CHEST AORTA   Follow-Up: At [facility], you and your health needs are our priority.  As part of our continuing mission to provide you with exceptional heart care, we have created  designated Provider Care Teams.  These Care Teams include your primary Cardiologist (physician) and Advanced Practice Providers (APPs -  Physician Assistants and Nurse Practitioners) who all work together to provide you with the care you need, when you need it.  We recommend signing up for the patient portal called "MyChart".  Sign up information is provided on this After Visit Summary.  MyChart is used to connect with patients for Virtual Visits (Telemedicine).  Patients are able to view lab/test results, encounter notes, upcoming appointments, etc.  Non-urgent messages can be sent to your provider as well.   To learn more about what you can do with MyChart, go to [email or web address].    Your next appointment:   12 month(s)  The format for your next appointment:   In Person  Provider:   Fransico Him, MD {   Other Instructions   Important Information About Sugar         Signed, Emmaline Life, NP  06/03/2022 9:38 AM    [place]

## 2022-06-01 ENCOUNTER — Ambulatory Visit: Payer: Medicare Other | Admitting: Nurse Practitioner

## 2022-06-01 ENCOUNTER — Encounter: Payer: Self-pay | Admitting: Nurse Practitioner

## 2022-06-01 ENCOUNTER — Ambulatory Visit (INDEPENDENT_AMBULATORY_CARE_PROVIDER_SITE_OTHER): Payer: Medicare Other | Admitting: Internal Medicine

## 2022-06-01 VITALS — BP 118/76 | HR 59 | Temp 97.8°F | Ht 64.0 in | Wt 208.0 lb

## 2022-06-01 DIAGNOSIS — J4530 Mild persistent asthma, uncomplicated: Secondary | ICD-10-CM | POA: Diagnosis not present

## 2022-06-01 DIAGNOSIS — J329 Chronic sinusitis, unspecified: Secondary | ICD-10-CM

## 2022-06-01 DIAGNOSIS — R053 Chronic cough: Secondary | ICD-10-CM

## 2022-06-01 DIAGNOSIS — J310 Chronic rhinitis: Secondary | ICD-10-CM

## 2022-06-01 DIAGNOSIS — R0982 Postnasal drip: Secondary | ICD-10-CM

## 2022-06-01 LAB — PULMONARY FUNCTION TEST
DL/VA % pred: 88 %
DL/VA: 3.57 ml/min/mmHg/L
DLCO cor % pred: 89 %
DLCO cor: 16.62 ml/min/mmHg
DLCO unc % pred: 87 %
DLCO unc: 16.2 ml/min/mmHg
FEF 25-75 Post: 2.9 L/sec
FEF 25-75 Pre: 1.82 L/sec
FEF2575-%Change-Post: 58 %
FEF2575-%Pred-Post: 231 %
FEF2575-%Pred-Pre: 145 %
FEV1-%Change-Post: 12 %
FEV1-%Pred-Post: 119 %
FEV1-%Pred-Pre: 106 %
FEV1-Post: 2.19 L
FEV1-Pre: 1.95 L
FEV1FVC-%Change-Post: 3 %
FEV1FVC-%Pred-Pre: 109 %
FEV6-%Change-Post: 8 %
FEV6-%Pred-Post: 112 %
FEV6-%Pred-Pre: 104 %
FEV6-Post: 2.64 L
FEV6-Pre: 2.43 L
FEV6FVC-%Pred-Post: 106 %
FEV6FVC-%Pred-Pre: 106 %
FVC-%Change-Post: 8 %
FVC-%Pred-Post: 106 %
FVC-%Pred-Pre: 98 %
FVC-Post: 2.64 L
FVC-Pre: 2.43 L
Post FEV1/FVC ratio: 83 %
Post FEV6/FVC ratio: 100 %
Pre FEV1/FVC ratio: 80 %
Pre FEV6/FVC Ratio: 100 %
RV % pred: 101 %
RV: 2.49 L
TLC % pred: 102 %
TLC: 5.18 L

## 2022-06-01 LAB — POCT EXHALED NITRIC OXIDE: FeNO level (ppb): 13

## 2022-06-01 MED ORDER — FLUTICASONE PROPIONATE 50 MCG/ACT NA SUSP: 2.0000 | Freq: Every day | NASAL | 2 refills | Status: AC

## 2022-06-01 MED ORDER — BUDESONIDE-FORMOTEROL FUMARATE 80-4.5 MCG/ACT IN AERO: 2.0000 | INHALATION_SPRAY | Freq: Two times a day (BID) | RESPIRATORY_TRACT | 5 refills | Status: DC

## 2022-06-01 NOTE — Patient Instructions (Signed)
pft  

## 2022-06-01 NOTE — Assessment & Plan Note (Signed)
CT sinus was clear.  Allergen panel was negative.  She does have some elevated eosinophils on CBC and has a history of allergy type symptoms.  We will start her on intranasal steroid for better postnasal drainage control.  Continue saline nasal spray as needed.

## 2022-06-01 NOTE — Patient Instructions (Addendum)
Continue Zyrtec 1 tab daily for allergies Continue saline nasal spray 2-3 times a day as needed for postnasal drip/congestion  Flonase nasal spray 2 sprays each nostril daily  Chlorpheniramine 4 mg over the counter tab at bedtime for cough  Trial Symbicort 2 puffs Twice daily. Brush tongue and rinse mouth afterwards  Suck on sugar free hard candies throughout the day to help keep your mouth moist. Avoid throat clearing. Sips of water with urge to clear throat or cough.   Follow up in 6 weeks with Dr. Chase Caller or Alanson Aly. If symptoms do not improve or worsen, please contact office for sooner follow up or seek emergency care.

## 2022-06-01 NOTE — Addendum Note (Signed)
Addended by: Konrad Felix L on: 06/01/2022 03:57 PM   Modules accepted: Orders

## 2022-06-01 NOTE — Assessment & Plan Note (Addendum)
Cough seems to be multifactorial.  She does have an asthmatic component as well as significant postnasal drainage, likely causing upper airway irritation.  Initiate cough control strategies and limiting upper airway irritation.  See above plan

## 2022-06-01 NOTE — Assessment & Plan Note (Signed)
PFTs showed very mild obstruction with reversibility, consistent with asthma.  She also has air trapping on recent imaging and elevated eosinophils. FeNO nl today.  Suspect cough is multifactorial.  Regardless, we will trial her on ICS/LABA combo.  If she has good response to this and is stable for some time, can consider changing to as needed usage.  Asthma action plan discussed.  Patient Instructions  Continue Zyrtec 1 tab daily for allergies Continue saline nasal spray 2-3 times a day as needed for postnasal drip/congestion  Flonase nasal spray 2 sprays each nostril daily  Chlorpheniramine 4 mg over the counter tab at bedtime for cough  Trial Symbicort 2 puffs Twice daily. Brush tongue and rinse mouth afterwards  Suck on sugar free hard candies throughout the day to help keep your mouth moist. Avoid throat clearing. Sips of water with urge to clear throat or cough.   Follow up in 6 weeks with Dr. Chase Caller or Alanson Aly. If symptoms do not improve or worsen, please contact office for sooner follow up or seek emergency care.

## 2022-06-01 NOTE — Progress Notes (Signed)
$'@Patient'c$  ID: Brittany Robertson, female    DOB: 1941-10-06, 81 y.o.   MRN: [id number]  Chief Complaint  Patient presents with   Follow-up    Referring provider: Mayra Neer, MD  HPI: 81 year old female, never smoker followed for chronic cough.  She is a patient of Dr. Golden Pop and last seen in office 04/22/2022.  She had been seen many years prior, referred back due to her cough.  Past medical history significant for hypertension, CAD, CHF, AAA without rupture, GERD, OA, HLD, obesity.  TEST/EVENTS:  05/19/2022 HRCT chest: Atherosclerosis.  No LAD.  There is no evidence of ILD.  There is air trapping present indicative of small airways disease.  There is a splenic artery aneurysm which is similar when compared to prior. 05/19/2022 CT sinus: Clear sinuses and nasal cavity 06/01/2022 PFTs: FVC 98, FEV1 106, ratio 80, TLC 102, DLCOcor 89. Positive BD (12%).   04/22/2022: OV with Dr. Chase Caller.  She had previously been seen and followed for pulmonary nodule.  Had not been seen in about 5 years and was referred back for chronic cough, having some issues with short-term memory recently.  Has been helped provide history.  Cough been present for at least a year.  Husband follows though it was progressively getting worse.  Patient felt it was relatively unchanged.  Does not have any issues with dysphagia other than pills.  No significant shortness of breath.  Reported having a lot of issues with postnasal drainage and using a lot of Kleenex.  No known acid reflux issues.  No known wheezing.  Also having some issues with insomnia and nocturia.  Discussed that cough could be from sinus drainage, acid reflux, asthma or none of these; could also be a combination and cause of cyclical cough/LPR cough.  CBC with differential -eosinophils 300, RAST allergy panel if.  HRCT supine and prone without evidence of ILD; there were some mild air trapping.  CT sinus without contrast with clear sinuses.  Full PFT.  06/01/2022:  Today-follow-up Patient presents today with husband for follow-up after undergoing pulmonary function testing.  Today, she reports that her cough is relatively unchanged.  It is primarily worse during the day and is dry, sometimes hacking.  She does not have any trouble with her breathing for the most part, sometimes will get winded with longer distances.  No wheezing or chest tightness.  She does still have some significant postnasal drip for which she uses saline nasal sprays for.  No significant GERD symptoms.  She has never been on inhalers before.  Allergies  Allergen Reactions   Codeine Nausea And Vomiting   Sulfa Antibiotics Hives and Itching   Sulfasalazine Itching and Hives   Adhesive [Tape] Itching, Rash and Other (See Comments)    Skin irritation   Atorvastatin Rash and Other (See Comments)    GENERIC causes severe skin rash but can take Brand Name   Other Rash    Surgical tape   Penicillins Hives, Itching and Rash    Has patient had a PCN reaction causing immediate rash, facial/tongue/throat swelling, SOB or lightheadedness with hypotension: Yes Has patient had a PCN reaction causing severe rash involving mucus membranes or skin necrosis: Yes Has patient had a PCN reaction that required hospitalization No Has patient had a PCN reaction occurring within the last 10 years: No If all of the above answers are "NO", then may proceed with Cephalosporin use.     Immunization History  Administered Date(s) Administered   Influenza,  High Dose Seasonal PF 06/29/2016   Influenza,inj,Quad PF,6+ Mos 05/20/2015   Pneumococcal Conjugate-13 10/19/2013   Unspecified SARS-COV-2 Vaccination 02/14/2021    Past Medical History:  Diagnosis Date   Ascending aortic aneurysm ([facility])    28m 07/2018   Bruises easily    Chronic diastolic CHF (congestive heart failure) (HCC)    Complication of anesthesia    "takes a long time to wake up"bp little low when wakes up   Coronary artery disease     PCI 2006, repeat cath 30% mid LAD distal to stent otherwise normal coronary arteries, cath 08/2013 with patent LAD stent and otherwise normal coronary arteries with diastolic dysfunction. Cath 08/2016 with normal RHC, normal LVEDP, patent LAD stent with otherwise normal coronary arteries   DDD (degenerative disc disease), cervical    Dilated aortic root (HCC)    461mby echo 07/2017   Dysrhythmia    GERD (gastroesophageal reflux disease)    History of kidney stones    Hyperlipidemia    Hypertension    Left ureteral stone    OA (osteoarthritis)    Obesity    Osteopenia    PONV (postoperative nausea and vomiting)    Motion sickness when move out of PACU to room   PVC's (premature ventricular contractions)    SOB (shortness of breath) 07/13/2016   non cardiac and secondary to deconditioning and obesity    Tobacco History: Social History   Tobacco Use  Smoking Status Never   Passive exposure: Past  Smokeless Tobacco Never   Counseling given: Not Answered   Outpatient Medications Prior to Visit  Medication Sig Dispense Refill   acetaminophen (TYLENOL) 500 MG tablet Take 500-1,000 mg by mouth every 6 (six) hours as needed for moderate pain or headache.      aspirin 81 MG tablet Take 81 mg by mouth every evening.      betamethasone dipropionate (DIPROLENE) 0.05 % ointment Apply topically 2 (two) times daily as needed (Rash). 45 g 3   cetirizine (ZYRTEC) 10 MG tablet Take 10 mg by mouth every morning.     cholecalciferol (VITAMIN D) 400 UNITS TABS Take 400 Units by mouth 2 (two) times daily.     denosumab (PROLIA) 60 MG/ML SOLN injection Inject 60 mg into the skin every 6 (six) months. Administer in upper arm, thigh, or abdomen     docusate sodium (COLACE) 100 MG capsule Take 100 mg by mouth 2 (two) times daily.     ezetimibe (ZETIA) 10 MG tablet TAKE 1 TABLET BY MOUTH IN  THE EVENING 90 tablet 3   famotidine (PEPCID) 20 MG tablet Take one tablet by mouth daily as needed 90 tablet 3    fluocinonide gel (LIDEX) 0.05 % Apply topically.     Melatonin 5 MG CAPS Take 5 mg by mouth at bedtime.     meloxicam (MOBIC) 15 MG tablet Take 15 mg by mouth every evening.     Menthol, Topical Analgesic, (ICY HOT) 16 % LIQD Apply topically.     methocarbamol (ROBAXIN) 500 MG tablet Take 500 mg by mouth 4 (four) times daily as needed for muscle spasms (cramps).     metoprolol succinate (TOPROL-XL) 25 MG 24 hr tablet TAKE ONE-HALF TABLET BY  MOUTH DAILY 45 tablet 3   mupirocin ointment (BACTROBAN) 2 % Apply 1 application topically 2 (two) times daily. 22 g 6   MYRBETRIQ 50 MG TB24 tablet Take 50 mg by mouth at bedtime.     nitroGLYCERIN (NITROSTAT) 0.4  MG SL tablet Place 1 tablet (0.4 mg total) under the tongue every 5 (five) minutes as needed. As needed for chest pain x 3 doses (Patient taking differently: Place 0.4 mg under the tongue every 5 (five) minutes as needed for chest pain.) 25 tablet 0   olmesartan (BENICAR) 40 MG tablet TAKE 1 TABLET BY MOUTH  DAILY 90 tablet 3   polyethylene glycol (MIRALAX / GLYCOLAX) 17 g packet Take 17 g by mouth daily as needed.     Probiotic Product (PROBIOTIC-10 PO) Take 1 tablet by mouth daily at 6 (six) AM.     rosuvastatin (CRESTOR) 20 MG tablet Take 20 mg by mouth daily.     sodium chloride (OCEAN) 0.65 % SOLN nasal spray Place 1 spray into both nostrils as needed for congestion.     triamcinolone cream (KENALOG) 0.1 % Apply 1 application. topically daily. 80 g 11   Vitamin D, Cholecalciferol, 10 MCG (400 UNIT) CAPS Take 400 Int'l Units/1.63m by mouth every Monday, Wednesday, and Friday.     clindamycin (CLEOCIN) 300 MG capsule Take 300 mg by mouth every 6 (six) hours. (Patient not taking: Reported on 06/01/2022)     FLUZONE HIGH-DOSE QUADRIVALENT 0.7 ML SUSY      furosemide (LASIX) 20 MG tablet TAKE 1 TABLET BY MOUTH  DAILY AS NEEDED FOR FLUID  OR EDEMA (Patient not taking: Reported on 06/01/2022) 90 tablet 3   PFIZER COVID-19 VAC BIVALENT injection       No facility-administered medications prior to visit.     Review of Systems:   Constitutional: No weight loss or gain, night sweats, fevers, chills, fatigue, or lassitude. HEENT: No headaches, difficulty swallowing, tooth/dental problems, or sore throat. No sneezing, itching, ear ache +nasal congestion, post nasal drip CV:  No chest pain, orthopnea, PND, swelling in lower extremities, anasarca, dizziness, palpitations, syncope Resp: +shortness of breath with strenuous exertion; dry cough. No excess mucus or change in color of mucus. No hemoptysis. No wheezing.  No chest wall deformity GI:  No heartburn, indigestion, abdominal pain, nausea, vomiting, diarrhea, change in bowel habits, loss of appetite, bloody stools.  Skin: No rash, lesions, ulcerations MSK:  No joint pain or swelling.  No decreased range of motion.  No back pain. Neuro: No dizziness or lightheadedness.  Psych: No depression or anxiety. Mood stable.     Physical Exam:  BP 118/76 (BP Location: Right Arm, Patient Position: Sitting, Cuff Size: Normal)   Pulse (!) 59   Temp 97.8 F (36.6 C) (Oral)   Ht '5\' 4"'$  (1.626 m)   Wt 208 lb (94.3 kg)   SpO2 98%   BMI 35.70 kg/m   GEN: Pleasant, interactive, well-appearing; obese; in no acute distress. HEENT:  Normocephalic and atraumatic. PERRLA. Sclera white. Nasal turbinates pink, moist and patent bilaterally. No rhinorrhea present. Oropharynx pink and moist, without exudate or edema. No lesions, ulcerations, or postnasal drip.  NECK:  Supple w/ fair ROM. No JVD present. Normal carotid impulses w/o bruits. Thyroid symmetrical with no goiter or nodules palpated. No lymphadenopathy.   CV: RRR, no m/r/g, no peripheral edema. Pulses intact, +2 bilaterally. No cyanosis, pallor or clubbing. PULMONARY:  Unlabored, regular breathing. Clear bilaterally A&P w/o wheezes/rales/rhonchi. No accessory muscle use. No dullness to percussion. GI: BS present and normoactive. Soft, non-tender to  palpation. No organomegaly or masses detected. No CVA tenderness. MSK: No erythema, warmth or tenderness. Cap refil <2 sec all extrem. No deformities or joint swelling noted.  Neuro: A/Ox3. No focal  deficits noted.   Skin: Warm, no lesions or rashe Psych: Normal affect and behavior. Judgement and thought content appropriate.     Lab Results:  CBC    Component Value Date/Time   WBC 6.3 04/22/2022 1029   RBC 3.90 04/22/2022 1029   HGB 12.6 04/22/2022 1029   HCT 38.0 04/22/2022 1029   PLT 183.0 04/22/2022 1029   MCV 97.3 04/22/2022 1029   MCH 32.4 09/11/2020 1033   MCHC 33.1 04/22/2022 1029   RDW 13.9 04/22/2022 1029   LYMPHSABS 1.6 04/22/2022 1029   MONOABS 0.8 04/22/2022 1029   EOSABS 0.3 04/22/2022 1029   BASOSABS 0.1 04/22/2022 1029    BMET    Component Value Date/Time   NA 142 02/04/2021 1344   K 4.4 02/04/2021 1344   CL 104 02/04/2021 1344   CO2 21 02/04/2021 1344   GLUCOSE 98 02/04/2021 1344   GLUCOSE 119 (H) 09/11/2020 1033   BUN 17 02/04/2021 1344   CREATININE 0.92 02/04/2021 1344   CREATININE 0.90 08/26/2016 0838   CALCIUM 9.4 02/04/2021 1344   GFRNONAA >60 09/11/2020 1033   GFRAA >60 12/28/2019 1407    BNP    Component Value Date/Time   BNP 54.6 04/24/2016 0850     Imaging:  CT Chest High Resolution  Result Date: 05/20/2022 CLINICAL DATA:  Chronic cough, chest pain, sinusitis, postnasal drip and nasal congestion. EXAM: CT CHEST WITHOUT CONTRAST TECHNIQUE: Multidetector CT imaging of the chest was performed following the standard protocol without intravenous contrast. High resolution imaging of the lungs, as well as inspiratory and expiratory imaging, was performed. RADIATION DOSE REDUCTION: This exam was performed according to the departmental dose-optimization program which includes automated exposure control, adjustment of the mA and/or kV according to patient size and/or use of iterative reconstruction technique. COMPARISON:  02/13/2021. FINDINGS:  Cardiovascular: Atherosclerotic calcification of the aorta, aortic valve and coronary arteries. Heart size normal. No pericardial effusion. Mediastinum/Nodes: No pathologically enlarged mediastinal or axillary nodes. Hilar regions are difficult to definitively evaluate without IV contrast. Esophagus is grossly unremarkable. Lungs/Pleura: No subpleural reticulation, traction bronchiectasis/bronchiolectasis, ground-glass or architectural distortion. There is air trapping. No pleural fluid. Airway is unremarkable. Upper Abdomen: Liver is unremarkable. Cholecystectomy. Adrenal glands and visualized portion are unremarkable. Stone in the left kidney. Scarring and probable renal sinus cyst in the left kidney. No specific necessary. Visualized portions of the spleen, pancreas stomach and bowel are unremarkable with the exception a small hernia. Atherosclerotic calcification of the aorta. Calcified splenic artery aneurysm measures 1.5 cm, similar. Musculoskeletal: Degenerative changes in the spine. No worrisome lytic or sclerotic lesions. IMPRESSION: 1. No evidence of interstitial lung disease. Air trapping is indicative of small airways disease. 2. Left renal stone. 3. Splenic artery aneurysm. 4. Aortic atherosclerosis (ICD10-I70.0). Coronary artery calcification. Electronically Signed   By: Lorin Picket M.D.   On: 05/20/2022 09:43   CT MAXILLOFACIAL WO CONTRAST  Result Date: 05/19/2022 CLINICAL DATA:  Postnasal drip. Sinusitis with chronic cough. Nasal congestion EXAM: CT MAXILLOFACIAL WITHOUT CONTRAST TECHNIQUE: Multidetector CT imaging of the maxillofacial structures was performed. Multiplanar CT image reconstructions were also generated. RADIATION DOSE REDUCTION: This exam was performed according to the departmental dose-optimization program which includes automated exposure control, adjustment of the mA and/or kV according to patient size and/or use of iterative reconstruction technique. COMPARISON:  None  Available. FINDINGS: Osseous: No fracture or mandibular dislocation. No destructive process. 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 The adjacent lacrimal canal  appears patent. Orbits: Bilateral cataract resection.  No pathologic finding Sinuses: Diffusely clear. The frontal sinuses are notably expansive. The nasal septum is only mildly deviated to the right. Patent nasal cavity. Soft tissues: No evidence of mass or inflammation Limited intracranial: Atrophy and possible arachnoid cyst in the right middle cranial fossa. IMPRESSION: Clear sinuses and nasal cavity. Electronically Signed   By: Jorje Guild M.D.   On: 05/19/2022 18:56         Latest Ref Rng & Units 06/01/2022    9:34 AM 05/25/2016    9:31 AM  PFT Results  FVC-Pre L 2.43  P 2.59   FVC-Predicted Pre % 98  P 94   FVC-Post L 2.64  P 2.68   FVC-Predicted Post % 106  P 98   Pre FEV1/FVC % % 80  P 79   Post FEV1/FCV % % 83  P 84   FEV1-Pre L 1.95  P 2.05   FEV1-Predicted Pre % 106  P 100   FEV1-Post L 2.19  P 2.24   DLCO uncorrected ml/min/mmHg 16.20  P 14.85   DLCO UNC% % 87  P 61   DLCO corrected ml/min/mmHg 16.62  P   DLCO COR %Predicted % 89  P   DLVA Predicted % 88  P 78   TLC L 5.18  P 4.71   TLC % Predicted % 102  P 93   RV % Predicted % 101  P 80     P Preliminary result    Lab Results  Component Value Date   NITRICOXIDE 10 08/10/2016        Assessment & Plan:   Mild persistent asthma PFTs showed very mild obstruction with reversibility, consistent with asthma.  She also has air trapping on recent imaging and elevated eosinophils. FeNO nl today.  Suspect cough is multifactorial.  Regardless, we will trial her on ICS/LABA combo.  If she has good response to this and is stable for some time, can consider changing to as needed usage.  Asthma action plan discussed.  Patient Instructions  Continue Zyrtec 1 tab daily for allergies Continue saline nasal  spray 2-3 times a day as needed for postnasal drip/congestion  Flonase nasal spray 2 sprays each nostril daily  Chlorpheniramine 4 mg over the counter tab at bedtime for cough  Trial Symbicort 2 puffs Twice daily. Brush tongue and rinse mouth afterwards  Suck on sugar free hard candies throughout the day to help keep your mouth moist. Avoid throat clearing. Sips of water with urge to clear throat or cough.   Follow up in 6 weeks with Dr. Chase Caller or Alanson Aly. If symptoms do not improve or worsen, please contact office for sooner follow up or seek emergency care.    Chronic cough Cough seems to be multifactorial.  She does have an asthmatic component as well as significant postnasal drainage, likely causing upper airway irritation.  Initiate cough control strategies and limiting upper airway irritation.  See above plan  Chronic rhinitis CT sinus was clear.  Allergen panel was negative.  She does have some elevated eosinophils on CBC and has a history of allergy type symptoms.  We will start her on intranasal steroid for better postnasal drainage control.  Continue saline nasal spray as needed.   I spent 35 minutes of dedicated to the care of this patient on the date of this encounter to include pre-visit review of records, face-to-face time with the patient discussing conditions above, post visit ordering of  testing, clinical documentation with the electronic health record, making appropriate referrals as documented, and communicating necessary findings to members of the patients care team.  Clayton Bibles, NP 06/01/2022  Pt aware and understands NP's role.

## 2022-06-03 ENCOUNTER — Encounter: Payer: Self-pay | Admitting: Nurse Practitioner

## 2022-06-03 ENCOUNTER — Ambulatory Visit: Payer: Medicare Other | Admitting: Nurse Practitioner

## 2022-06-03 VITALS — BP 122/74 | HR 54 | Ht 64.0 in | Wt 208.0 lb

## 2022-06-03 DIAGNOSIS — E785 Hyperlipidemia, unspecified: Secondary | ICD-10-CM | POA: Diagnosis not present

## 2022-06-03 DIAGNOSIS — I10 Essential (primary) hypertension: Secondary | ICD-10-CM | POA: Diagnosis not present

## 2022-06-03 DIAGNOSIS — I2510 Atherosclerotic heart disease of native coronary artery without angina pectoris: Secondary | ICD-10-CM | POA: Diagnosis not present

## 2022-06-03 DIAGNOSIS — I7121 Aneurysm of the ascending aorta, without rupture: Secondary | ICD-10-CM | POA: Diagnosis not present

## 2022-06-03 LAB — BASIC METABOLIC PANEL
BUN/Creatinine Ratio: 24 (ref 12–28)
BUN: 21 mg/dL (ref 8–27)
CO2: 23 mmol/L (ref 20–29)
Calcium: 9.7 mg/dL (ref 8.7–10.3)
Chloride: 104 mmol/L (ref 96–106)
Creatinine, Ser: 0.88 mg/dL (ref 0.57–1.00)
Glucose: 105 mg/dL — ABNORMAL HIGH (ref 70–99)
Potassium: 4.5 mmol/L (ref 3.5–5.2)
Sodium: 141 mmol/L (ref 134–144)
eGFR: 65 mL/min/{1.73_m2} (ref >59)

## 2022-06-03 NOTE — Patient Instructions (Signed)
Medication Instructions:  Your physician recommends that you continue on your current medications as directed. Please refer to the Current Medication list given to you today. *If you need a refill on your cardiac medications before your next appointment, please call your pharmacy*   Lab Work: TODAY-BMET If you have labs (blood work) drawn today and your tests are completely normal, you will receive your results only by: [place] (if you have MyChart) OR A paper copy in the mail If you have any lab test that is abnormal or we need to change your treatment, we will call you to review the results.   Testing/Procedures: CT ANGIO CHEST AORTA   Follow-Up: At [facility], you and your health needs are our priority.  As part of our continuing mission to provide you with exceptional heart care, we have created designated Provider Care Teams.  These Care Teams include your primary Cardiologist (physician) and Advanced Practice Providers (APPs -  Physician Assistants and Nurse Practitioners) who all work together to provide you with the care you need, when you need it.  We recommend signing up for the patient portal called "MyChart".  Sign up information is provided on this After Visit Summary.  MyChart is used to connect with patients for Virtual Visits (Telemedicine).  Patients are able to view lab/test results, encounter notes, upcoming appointments, etc.  Non-urgent messages can be sent to your provider as well.   To learn more about what you can do with MyChart, go to [email or web address].    Your next appointment:   12 month(s)  The format for your next appointment:   In Person  Provider:   Fransico Him, MD {   Other Instructions   Important Information About Sugar

## 2022-06-05 NOTE — Addendum Note (Signed)
Addended byDanielle Dess on: 06/05/2022 12:57 PM   Modules accepted: Orders

## 2022-06-08 DIAGNOSIS — H35371 Puckering of macula, right eye: Secondary | ICD-10-CM | POA: Diagnosis not present

## 2022-06-08 DIAGNOSIS — Z961 Presence of intraocular lens: Secondary | ICD-10-CM | POA: Diagnosis not present

## 2022-06-08 DIAGNOSIS — H4301 Vitreous prolapse, right eye: Secondary | ICD-10-CM | POA: Diagnosis not present

## 2022-06-11 ENCOUNTER — Ambulatory Visit (HOSPITAL_COMMUNITY)
Admission: RE | Admit: 2022-06-11 | Discharge: 2022-06-11 | Disposition: A | Discharge status: Home / Self Care | Payer: Medicare Other | Source: Ambulatory Visit | Attending: Nurse Practitioner | Admitting: Nurse Practitioner

## 2022-06-11 DIAGNOSIS — I728 Aneurysm of other specified arteries: Secondary | ICD-10-CM | POA: Diagnosis not present

## 2022-06-11 DIAGNOSIS — I7121 Aneurysm of the ascending aorta, without rupture: Secondary | ICD-10-CM | POA: Diagnosis not present

## 2022-06-11 DIAGNOSIS — K449 Diaphragmatic hernia without obstruction or gangrene: Secondary | ICD-10-CM | POA: Diagnosis not present

## 2022-06-11 MED ORDER — IOHEXOL 350 MG/ML SOLN
75.0000 mL | Freq: Once | INTRAVENOUS | Status: AC | PRN
  Administered 2022-06-11: 75 mL via INTRAVENOUS

## 2022-06-16 DIAGNOSIS — I5030 Unspecified diastolic (congestive) heart failure: Secondary | ICD-10-CM | POA: Diagnosis not present

## 2022-06-16 DIAGNOSIS — M8580 Other specified disorders of bone density and structure, unspecified site: Secondary | ICD-10-CM | POA: Diagnosis not present

## 2022-06-16 DIAGNOSIS — J45909 Unspecified asthma, uncomplicated: Secondary | ICD-10-CM | POA: Diagnosis not present

## 2022-06-16 DIAGNOSIS — K219 Gastro-esophageal reflux disease without esophagitis: Secondary | ICD-10-CM | POA: Diagnosis not present

## 2022-06-16 DIAGNOSIS — R7301 Impaired fasting glucose: Secondary | ICD-10-CM | POA: Diagnosis not present

## 2022-06-16 DIAGNOSIS — I25119 Atherosclerotic heart disease of native coronary artery with unspecified angina pectoris: Secondary | ICD-10-CM | POA: Diagnosis not present

## 2022-06-16 DIAGNOSIS — I110 Hypertensive heart disease with heart failure: Secondary | ICD-10-CM | POA: Diagnosis not present

## 2022-06-16 DIAGNOSIS — E782 Mixed hyperlipidemia: Secondary | ICD-10-CM | POA: Diagnosis not present

## 2022-06-16 DIAGNOSIS — M150 Primary generalized (osteo)arthritis: Secondary | ICD-10-CM | POA: Diagnosis not present

## 2022-06-17 ENCOUNTER — Encounter: Payer: Self-pay | Admitting: Podiatry

## 2022-06-17 ENCOUNTER — Ambulatory Visit: Payer: Medicare Other | Admitting: Podiatry

## 2022-06-17 DIAGNOSIS — M79674 Pain in right toe(s): Secondary | ICD-10-CM

## 2022-06-17 DIAGNOSIS — B351 Tinea unguium: Secondary | ICD-10-CM

## 2022-06-17 NOTE — Progress Notes (Signed)
This patient presents  to the office for evaluation and treatment of long thick painful nails .  This patient is unable to trim her own nails since the patient cannot reach her feet.  Patient says the nails are painful walking and wearing his shoes.  He returns for preventive foot care services.  General Appearance  Alert, conversant and in no acute stress.  Vascular  Dorsalis pedis and posterior tibial  pulses are palpable  bilaterally.  Capillary return is within normal limits  bilaterally. Temperature is within normal limits  bilaterally.  Neurologic  Senn-Weinstein monofilament wire test within normal limits  bilaterally. Muscle power within normal limits bilaterally.  Nails Thick disfigured discolored nails with subungual debris  3,4 and 5 toenails  right. No evidence of bacterial infection or drainage bilaterally.  Orthopedic  No limitations of motion  feet .  No crepitus or effusions noted.  No bony pathology or digital deformities noted. HAV  B/L.  Hammer toes 2-5  B/L.  Skin  normotropic skin with no porokeratosis noted bilaterally.  No signs of infections or ulcers noted.     Onychomycosis  Pain in toes right foot    Debridement  of nails  1-5  B/L with a nail nipper.  Nails were then filed using a dremel tool with no incidents.    RTC  10 weeks    Gardiner Barefoot DPM

## 2022-07-03 ENCOUNTER — Encounter: Payer: Self-pay | Admitting: Family Medicine

## 2022-07-03 DIAGNOSIS — M5416 Radiculopathy, lumbar region: Secondary | ICD-10-CM | POA: Diagnosis not present

## 2022-07-03 DIAGNOSIS — M5030 Other cervical disc degeneration, unspecified cervical region: Secondary | ICD-10-CM | POA: Diagnosis not present

## 2022-07-03 DIAGNOSIS — M5136 Other intervertebral disc degeneration, lumbar region: Secondary | ICD-10-CM | POA: Diagnosis not present

## 2022-07-08 ENCOUNTER — Ambulatory Visit: Payer: Medicare Other | Admitting: Physician Assistant

## 2022-07-13 ENCOUNTER — Encounter: Payer: Self-pay | Admitting: Nurse Practitioner

## 2022-07-13 ENCOUNTER — Ambulatory Visit: Payer: Medicare Other | Admitting: Nurse Practitioner

## 2022-07-13 VITALS — BP 138/78 | HR 58 | Temp 98.0°F | Ht 64.0 in | Wt 210.4 lb

## 2022-07-13 DIAGNOSIS — Z23 Encounter for immunization: Secondary | ICD-10-CM | POA: Diagnosis not present

## 2022-07-13 DIAGNOSIS — J310 Chronic rhinitis: Secondary | ICD-10-CM

## 2022-07-13 DIAGNOSIS — J4530 Mild persistent asthma, uncomplicated: Secondary | ICD-10-CM

## 2022-07-13 NOTE — Progress Notes (Addendum)
$'@Patient'v$  ID: Brittany Robertson, female    DOB: 04-29-1941, 81 y.o.   MRN: [id number]  Chief Complaint  Patient presents with   Follow-up    Referring provider: Mayra Neer, MD  HPI: 81 year old female, never smoker followed for chronic cough.  She is a patient of Dr. Golden Pop and last seen in office 06/01/2022 by [facility] NP.  She had been seen many years prior, referred back due to her cough.  Past medical history significant for hypertension, CAD, CHF, AAA without rupture, GERD, OA, HLD, obesity.  TEST/EVENTS:  05/19/2022 HRCT chest: Atherosclerosis.  No LAD.  There is no evidence of ILD.  There is air trapping present indicative of small airways disease.  There is a splenic artery aneurysm which is similar when compared to prior. 05/19/2022 CT sinus: Clear sinuses and nasal cavity 06/01/2022 PFTs: FVC 98, FEV1 106, ratio 80, TLC 102, DLCOcor 89. Positive BD (12%).   04/22/2022: OV with Dr. Chase Caller.  She had previously been seen and followed for pulmonary nodule.  Had not been seen in about 5 years and was referred back for chronic cough, having some issues with short-term memory recently.  Has been helped provide history.  Cough been present for at least a year.  Husband follows though it was progressively getting worse.  Patient felt it was relatively unchanged.  Does not have any issues with dysphagia other than pills.  No significant shortness of breath.  Reported having a lot of issues with postnasal drainage and using a lot of Kleenex.  No known acid reflux issues.  No known wheezing.  Also having some issues with insomnia and nocturia.  Discussed that cough could be from sinus drainage, acid reflux, asthma or none of these; could also be a combination and cause of cyclical cough/LPR cough.  CBC with differential -eosinophils 300, RAST allergy panel if.  HRCT supine and prone without evidence of ILD; there were some mild air trapping.  CT sinus without contrast with clear sinuses.  Full  PFT.  06/01/2022: OV with Casy Tavano NP for follow-up after undergoing pulmonary function testing.  Today, she reports that her cough is relatively unchanged.  It is primarily worse during the day and is dry, sometimes hacking.  She does not have any trouble with her breathing for the most part, sometimes will get winded with longer distances.  No wheezing or chest tightness.  She does still have some significant postnasal drip for which she uses saline nasal sprays for.  No significant GERD symptoms.  PFTs showed very mild obstruction with reversibility, consistent with asthma.  She also has air trapping on recent imaging and elevated eosinophils. FeNO nl today.  Suspect cough is multifactorial.  Regardless, we will trial her on ICS/LABA combo.  If she has good response to this and is stable for some time, can consider changing to as needed usage.  Asthma action plan discussed. CT sinus was clear.  Allergen panel was negative.  She does have some elevated eosinophils on CBC and has a history of allergy type symptoms.  We will start her on intranasal steroid for better postnasal drainage control.  07/13/2022: Today - follow up Patient presents today for follow up with her husband. She reports that her cough has been better since she was here last. She was trying to do 2 puffs Twice daily initially but this caused some throat irritation so she's doing puff twice a day. Her cough is almost gone; she still occasionally has a throat tickle with  a dry cough but this is minimal. Breathing stable. No significant wheezing or chest congestion. Her nasal congestion and post nasal drip are better with flonase. No concerns or complaints today.  Allergies  Allergen Reactions   Codeine Nausea And Vomiting   Sulfa Antibiotics Hives and Itching   Sulfasalazine Itching and Hives   Adhesive [Tape] Itching, Rash and Other (See Comments)    Skin irritation   Atorvastatin Rash and Other (See Comments)    GENERIC causes severe  skin rash but can take Brand Name   Other Rash    Surgical tape   Penicillins Hives, Itching and Rash    Has patient had a PCN reaction causing immediate rash, facial/tongue/throat swelling, SOB or lightheadedness with hypotension: Yes Has patient had a PCN reaction causing severe rash involving mucus membranes or skin necrosis: Yes Has patient had a PCN reaction that required hospitalization No Has patient had a PCN reaction occurring within the last 10 years: No If all of the above answers are "NO", then may proceed with Cephalosporin use.     Immunization History  Administered Date(s) Administered   Fluad Quad(high Dose 65+) 07/13/2022   Influenza, High Dose Seasonal PF 06/29/2016   Influenza,inj,Quad PF,6+ Mos 05/20/2015   Pneumococcal Conjugate-13 10/19/2013   Unspecified SARS-COV-2 Vaccination 02/14/2021    Past Medical History:  Diagnosis Date   Ascending aortic aneurysm ([place])    72m 07/2018   Bruises easily    Chronic diastolic CHF (congestive heart failure) (HCC)    Complication of anesthesia    "takes a long time to wake up"bp little low when wakes up   Coronary artery disease    PCI 2006, repeat cath 30% mid LAD distal to stent otherwise normal coronary arteries, cath 08/2013 with patent LAD stent and otherwise normal coronary arteries with diastolic dysfunction. Cath 08/2016 with normal RHC, normal LVEDP, patent LAD stent with otherwise normal coronary arteries   DDD (degenerative disc disease), cervical    Dilated aortic root (HCC)    422mby echo 07/2017   Dysrhythmia    GERD (gastroesophageal reflux disease)    History of kidney stones    Hyperlipidemia    Hypertension    Left ureteral stone    OA (osteoarthritis)    Obesity    Osteopenia    PONV (postoperative nausea and vomiting)    Motion sickness when move out of PACU to room   PVC's (premature ventricular contractions)    SOB (shortness of breath) 07/13/2016   non cardiac and secondary to  deconditioning and obesity    Tobacco History: Social History   Tobacco Use  Smoking Status Never   Passive exposure: Past  Smokeless Tobacco Never   Counseling given: Not Answered   Outpatient Medications Prior to Visit  Medication Sig Dispense Refill   acetaminophen (TYLENOL) 500 MG tablet Take 500-1,000 mg by mouth every 6 (six) hours as needed for moderate pain or headache.      aspirin 81 MG tablet Take 81 mg by mouth every evening.      betamethasone dipropionate (DIPROLENE) 0.05 % ointment Apply topically 2 (two) times daily as needed (Rash). 45 g 3   budesonide-formoterol (SYMBICORT) 80-4.5 MCG/ACT inhaler Inhale 2 puffs into the lungs in the morning and at bedtime. (Patient taking differently: Inhale 1 puff into the lungs in the morning and at bedtime. Twice a day) 1 each 5   cetirizine (ZYRTEC) 10 MG tablet Take 10 mg by mouth every morning.  cholecalciferol (VITAMIN D) 400 UNITS TABS Take 400 Units by mouth 2 (two) times daily.     denosumab (PROLIA) 60 MG/ML SOLN injection Inject 60 mg into the skin every 6 (six) months. Administer in upper arm, thigh, or abdomen     docusate sodium (COLACE) 100 MG capsule Take 100 mg by mouth 2 (two) times daily.     ezetimibe (ZETIA) 10 MG tablet TAKE 1 TABLET BY MOUTH IN  THE EVENING 90 tablet 3   famotidine (PEPCID) 20 MG tablet Take one tablet by mouth daily as needed 90 tablet 3   fluocinonide gel (LIDEX) 0.05 % Apply topically.     fluticasone (FLONASE) 50 MCG/ACT nasal spray Place 2 sprays into both nostrils daily. 18.2 mL 2   FLUZONE HIGH-DOSE QUADRIVALENT 0.7 ML SUSY      furosemide (LASIX) 20 MG tablet TAKE 1 TABLET BY MOUTH  DAILY AS NEEDED FOR FLUID  OR EDEMA 90 tablet 3   Melatonin 5 MG CAPS Take 5 mg by mouth at bedtime.     meloxicam (MOBIC) 15 MG tablet Take 15 mg by mouth every evening.     Menthol, Topical Analgesic, (ICY HOT) 16 % LIQD Apply topically.     methocarbamol (ROBAXIN) 500 MG tablet Take 500 mg by mouth 4  (four) times daily as needed for muscle spasms (cramps).     metoprolol succinate (TOPROL-XL) 25 MG 24 hr tablet TAKE ONE-HALF TABLET BY  MOUTH DAILY 45 tablet 3   mupirocin ointment (BACTROBAN) 2 % Apply 1 application topically 2 (two) times daily. 22 g 6   MYRBETRIQ 50 MG TB24 tablet Take 50 mg by mouth at bedtime.     nitroGLYCERIN (NITROSTAT) 0.4 MG SL tablet Place 1 tablet (0.4 mg total) under the tongue every 5 (five) minutes as needed. As needed for chest pain x 3 doses (Patient taking differently: Place 0.4 mg under the tongue every 5 (five) minutes as needed for chest pain.) 25 tablet 0   olmesartan (BENICAR) 40 MG tablet TAKE 1 TABLET BY MOUTH  DAILY 90 tablet 3   polyethylene glycol (MIRALAX / GLYCOLAX) 17 g packet Take 17 g by mouth daily as needed.     Probiotic Product (PROBIOTIC-10 PO) Take 1 tablet by mouth daily at 6 (six) AM.     rosuvastatin (CRESTOR) 20 MG tablet Take 20 mg by mouth daily.     sodium chloride (OCEAN) 0.65 % SOLN nasal spray Place 1 spray into both nostrils as needed for congestion.     triamcinolone cream (KENALOG) 0.1 % Apply 1 application. topically daily. 80 g 11   Vitamin D, Cholecalciferol, 10 MCG (400 UNIT) CAPS Take 400 Int'l Units/1.27m by mouth every Monday, Wednesday, and Friday. (Patient not taking: Reported on 07/13/2022)     clindamycin (CLEOCIN) 300 MG capsule Take 300 mg by mouth every 6 (six) hours.     No facility-administered medications prior to visit.     Review of Systems:   Constitutional: No weight loss or gain, night sweats, fevers, chills, fatigue, or lassitude. HEENT: No headaches, difficulty swallowing, tooth/dental problems, or sore throat. No sneezing, itching, ear ache +nasal congestion, post nasal drip (improved) CV:  No chest pain, orthopnea, PND, swelling in lower extremities, anasarca, dizziness, palpitations, syncope Resp: +shortness of breath with strenuous exertion (baseline); dry cough (significant improvement). No  excess mucus or change in color of mucus. No hemoptysis. No wheezing.  No chest wall deformity GI:  No heartburn, indigestion, abdominal pain, nausea, vomiting,  diarrhea, change in bowel habits, loss of appetite, bloody stools.  Skin: No rash, lesions, ulcerations MSK:  No joint pain or swelling.  No decreased range of motion.  No back pain. Neuro: No dizziness or lightheadedness.  Psych: No depression or anxiety. Mood stable.     Physical Exam:  BP 138/78 (BP Location: Right Arm, Cuff Size: Normal)   Pulse (!) 58   Temp 98 F (36.7 C)   Ht '5\' 4"'$  (1.626 m)   Wt 210 lb 6.4 oz (95.4 kg)   SpO2 98%   BMI 36.12 kg/m   GEN: Pleasant, interactive, well-appearing; obese; in no acute distress. HEENT:  Normocephalic and atraumatic. PERRLA. Sclera white. Nasal turbinates pink, moist and patent bilaterally. No rhinorrhea present. Oropharynx pink and moist, without exudate or edema. No lesions, ulcerations, or postnasal drip.  NECK:  Supple w/ fair ROM. No JVD present. Normal carotid impulses w/o bruits. Thyroid symmetrical with no goiter or nodules palpated. No lymphadenopathy.   CV: RRR, no m/r/g, no peripheral edema. Pulses intact, +2 bilaterally. No cyanosis, pallor or clubbing. PULMONARY:  Unlabored, regular breathing. Clear bilaterally A&P w/o wheezes/rales/rhonchi. No accessory muscle use. No dullness to percussion. GI: BS present and normoactive. Soft, non-tender to palpation. No organomegaly or masses detected.  MSK: No erythema, warmth or tenderness. No deformities or joint swelling noted.  Neuro: A/Ox3. No focal deficits noted.   Skin: Warm, no lesions or rashe Psych: Normal affect and behavior. Judgement and thought content appropriate.     Lab Results:  CBC    Component Value Date/Time   WBC 6.3 04/22/2022 1029   RBC 3.90 04/22/2022 1029   HGB 12.6 04/22/2022 1029   HCT 38.0 04/22/2022 1029   PLT 183.0 04/22/2022 1029   MCV 97.3 04/22/2022 1029   MCH 32.4 09/11/2020 1033    MCHC 33.1 04/22/2022 1029   RDW 13.9 04/22/2022 1029   LYMPHSABS 1.6 04/22/2022 1029   MONOABS 0.8 04/22/2022 1029   EOSABS 0.3 04/22/2022 1029   BASOSABS 0.1 04/22/2022 1029    BMET    Component Value Date/Time   NA 141 06/03/2022 0949   K 4.5 06/03/2022 0949   CL 104 06/03/2022 0949   CO2 23 06/03/2022 0949   GLUCOSE 105 (H) 06/03/2022 0949   GLUCOSE 119 (H) 09/11/2020 1033   BUN 21 06/03/2022 0949   CREATININE 0.88 06/03/2022 0949   CREATININE 0.90 08/26/2016 0838   CALCIUM 9.7 06/03/2022 0949   GFRNONAA >60 09/11/2020 1033   GFRAA >60 12/28/2019 1407    BNP    Component Value Date/Time   BNP 54.6 04/24/2016 0850     Imaging:  No results found.       Latest Ref Rng & Units 06/01/2022    9:34 AM 05/25/2016    9:31 AM  PFT Results  FVC-Pre L 2.43  2.59   FVC-Predicted Pre % 98  94   FVC-Post L 2.64  2.68   FVC-Predicted Post % 106  98   Pre FEV1/FVC % % 80  79   Post FEV1/FCV % % 83  84   FEV1-Pre L 1.95  2.05   FEV1-Predicted Pre % 106  100   FEV1-Post L 2.19  2.24   DLCO uncorrected ml/min/mmHg 16.20  14.85   DLCO UNC% % 87  61   DLCO corrected ml/min/mmHg 16.62    DLCO COR %Predicted % 89    DLVA Predicted % 88  78   TLC L 5.18  4.71  TLC % Predicted % 102  93   RV % Predicted % 101  80     Lab Results  Component Value Date   NITRICOXIDE 10 08/10/2016        Assessment & Plan:   Mild persistent asthma Mild asthma with frequent dry cough. PFTs complete 05/2022 showed very mild obstruction with reversibility, consistent with asthma.  She also was noted to have air trapping on recent imaging and elevated eosinophils. Cough has improved with ICS/LABA therapy. She will continue this with 1 puff Twice daily. Continue antihistamine for trigger prevention.   Flu shot administered today. She will look into getting RSV vaccine and is going to check with Dr. Brigitte Pulse on her pneumonia vaccines as there is only PCV 13 in our system.  Patient  Instructions  Continue Zyrtec 1 tab daily for allergies Continue saline nasal spray 2-3 times a day as needed for postnasal drip/congestion Continue Flonase nasal spray 2 sprays each nostril daily  Continue Symbicort 1 puff Twice daily. Brush tongue and rinse mouth afterwards   Suck on sugar free hard candies throughout the day to help keep your mouth moist. Avoid throat clearing. Sips of water with urge to clear throat or cough.    Follow up in 3 months with Dr. Chase Caller or Alanson Aly. If symptoms do not improve or worsen, please contact office for sooner follow up or seek emergency care.    Chronic rhinitis Improved. Continue intranasal steroid and antihistamine.    I spent 28 minutes of dedicated to the care of this patient on the date of this encounter to include pre-visit review of records, face-to-face time with the patient discussing conditions above, post visit ordering of testing, clinical documentation with the electronic health record, making appropriate referrals as documented, and communicating necessary findings to members of the patients care team.  Clayton Bibles, NP 07/13/2022  Pt aware and understands NP's role.

## 2022-07-13 NOTE — Patient Instructions (Addendum)
Continue Zyrtec 1 tab daily for allergies Continue saline nasal spray 2-3 times a day as needed for postnasal drip/congestion Continue Flonase nasal spray 2 sprays each nostril daily  Continue Symbicort 1 puff Twice daily. Brush tongue and rinse mouth afterwards   Suck on sugar free hard candies throughout the day to help keep your mouth moist. Avoid throat clearing. Sips of water with urge to clear throat or cough.    Follow up in 3 months with Dr. Chase Caller or Alanson Aly. If symptoms do not improve or worsen, please contact office for sooner follow up or seek emergency care.

## 2022-07-13 NOTE — Assessment & Plan Note (Addendum)
Mild asthma with frequent dry cough. PFTs complete 05/2022 showed very mild obstruction with reversibility, consistent with asthma.  She also was noted to have air trapping on recent imaging and elevated eosinophils. Cough has improved with ICS/LABA therapy. She will continue this with 1 puff Twice daily. Continue antihistamine for trigger prevention.   Flu shot administered today. She will look into getting RSV vaccine and is going to check with Dr. Brigitte Pulse on her pneumonia vaccines as there is only PCV 13 in our system.  Patient Instructions  Continue Zyrtec 1 tab daily for allergies Continue saline nasal spray 2-3 times a day as needed for postnasal drip/congestion Continue Flonase nasal spray 2 sprays each nostril daily  Continue Symbicort 1 puff Twice daily. Brush tongue and rinse mouth afterwards   Suck on sugar free hard candies throughout the day to help keep your mouth moist. Avoid throat clearing. Sips of water with urge to clear throat or cough.    Follow up in 3 months with Dr. Chase Caller or Alanson Aly. If symptoms do not improve or worsen, please contact office for sooner follow up or seek emergency care.

## 2022-07-13 NOTE — Assessment & Plan Note (Signed)
Improved. Continue intranasal steroid and antihistamine.

## 2022-07-29 ENCOUNTER — Ambulatory Visit: Payer: Medicare Other | Admitting: Physician Assistant

## 2022-08-17 ENCOUNTER — Encounter (HOSPITAL_BASED_OUTPATIENT_CLINIC_OR_DEPARTMENT_OTHER): Payer: Self-pay | Admitting: Cardiology

## 2022-08-17 ENCOUNTER — Other Ambulatory Visit: Payer: Self-pay

## 2022-08-17 MED ORDER — METOPROLOL SUCCINATE ER 25 MG PO TB24: 12.5000 mg | ORAL_TABLET | Freq: Every day | ORAL | 3 refills | Status: DC

## 2022-08-26 ENCOUNTER — Encounter: Payer: Self-pay | Admitting: Podiatry

## 2022-08-26 ENCOUNTER — Ambulatory Visit: Payer: Medicare Other | Admitting: Podiatry

## 2022-08-26 DIAGNOSIS — M79674 Pain in right toe(s): Secondary | ICD-10-CM | POA: Diagnosis not present

## 2022-08-26 DIAGNOSIS — B351 Tinea unguium: Secondary | ICD-10-CM | POA: Diagnosis not present

## 2022-08-26 NOTE — Progress Notes (Signed)
This patient presents  to the office for evaluation and treatment of long thick painful nails .  This patient is unable to trim her own nails since the patient cannot reach her feet.  Patient says the nails are painful walking and wearing his shoes.  He returns for preventive foot care services.  General Appearance  Alert, conversant and in no acute stress.  Vascular  Dorsalis pedis and posterior tibial  pulses are palpable  bilaterally.  Capillary return is within normal limits  bilaterally. Temperature is within normal limits  bilaterally.  Neurologic  Senn-Weinstein monofilament wire test within normal limits  bilaterally. Muscle power within normal limits bilaterally.  Nails Thick disfigured discolored nails with subungual debris  3,4 and 5 toenails  right. No evidence of bacterial infection or drainage bilaterally.  Orthopedic  No limitations of motion  feet .  No crepitus or effusions noted.  No bony pathology or digital deformities noted. HAV  B/L.  Hammer toes 2-5  B/L.  Skin  normotropic skin with no porokeratosis noted bilaterally.  No signs of infections or ulcers noted.     Onychomycosis  Pain in toes right foot    Debridement  of nails  1-5  B/L with a nail nipper.  Nails were then filed using a dremel tool with no incidents.    RTC  12 weeks    Kedrick Mcnamee DPM   

## 2022-09-21 ENCOUNTER — Other Ambulatory Visit: Payer: Self-pay | Admitting: *Deleted

## 2022-09-21 ENCOUNTER — Encounter (HOSPITAL_BASED_OUTPATIENT_CLINIC_OR_DEPARTMENT_OTHER): Payer: Self-pay | Admitting: Cardiology

## 2022-09-21 MED ORDER — EZETIMIBE 10 MG PO TABS: 10.0000 mg | ORAL_TABLET | Freq: Every evening | ORAL | 3 refills | Status: DC

## 2022-10-08 ENCOUNTER — Ambulatory Visit: Payer: Medicare Other | Admitting: Internal Medicine

## 2022-10-08 ENCOUNTER — Encounter: Payer: Self-pay | Admitting: Internal Medicine

## 2022-10-08 VITALS — BP 120/62 | HR 61 | Temp 98.0°F | Ht 66.0 in | Wt 207.6 lb

## 2022-10-08 DIAGNOSIS — R0989 Other specified symptoms and signs involving the circulatory and respiratory systems: Secondary | ICD-10-CM

## 2022-10-08 DIAGNOSIS — R053 Chronic cough: Secondary | ICD-10-CM | POA: Diagnosis not present

## 2022-10-08 DIAGNOSIS — J45991 Cough variant asthma: Secondary | ICD-10-CM | POA: Diagnosis not present

## 2022-10-08 NOTE — Progress Notes (Signed)
Subjective:     Patient ID: Brittany Robertson, female   DOB: [birth year], 81 y.o.   MRN: [id number]  HPI  PCP Mayra Neer, MD   HPI   IOV 06/29/2016  Chief Complaint  Patient presents with   Pulmonary Consult    Pt referred by Dr. Fransico Him for abnormal PFT. Pt c/o DOE with little activity, dry cough and left upper chest pain when SOB. Pt saw cardiology in 04/2016 and did stress test and echo.      Brittany Robertson is a 81 y.o. female who is a resident of [place]. She presents with a husband because of insidious onset of shortness of breath for last 9 months. She tells me that in 2005 she had similar shortness of breath and at that time had coronary stent placed and shortness of breath resolved. Then this time dyspnea has recurred some 6-9 months ago. Since then it is been progressive. It is a moderate intensity. It is associated with a mild dry cough. This also progressive. She went to cardiologist Dr. Radford Pax who did cardiac stress test 05/14/2016 and this was low risk. Patient then had pulmonary function test which showed isolated reduction in diffusion capacity of 14.85/61%. Spirometry and total lung capacity normal. I personally visualized the trace. Chest x-ray done 05/25/2016 that I personally visualized shows possibility of interstitial lung disease. She does have a CT chest in 2012 done during the time of trauma that shows some infiltrates. She and her husband now wants this evaluated. Most recent hemoglobin 04/24/2016 is normal at 13.1 g percent. Creatinine is normal at 0.76 mg percent.    OV 08/10/2016  Chief Complaint  Patient presents with   Follow-up    Pt here after HRCT and CPST. Pt states she is still SOB, no change. Pt c/o occassional dry cough and upper left chest pain. Pt denies f/c/s and swelling.    Follow-up dyspnea  Here with her husband review high-resolution CT chest documented below and pulmonary stress test done 07/29/2016. Pulmonary stress test  results reviewed. She was wheezing at the outset. She had dyspnea then exercise. She given adequate exercise. Etiology of dyspnea is likely obesity and possible diastolic dysfunction but mostly obesity. This is based on Ve. there was no evidence of exercise-induced spasm. Today we did exhaled nitric oxide and this was normal at 10. In discussing the results husband reported that several years ago she had upper endoscopy for a sensation of frog in the throat and was referred to Dr. Janace Hoard and ENT and something is found in the vocal . He also admits to significant snoring of the wife but no nighttime apnea spells. There is a definite fatigue but no excessive daytime somnolence. They're wondering about possible sleep apnea.      IMPRESSION: 1. No evidence of interstitial lung disease. 2. Right middle lobe 4 mm solid pulmonary nodule, new since 09/27/2011. No follow-up needed if patient is low-risk. Non-contrast chest CT can be considered in 12 months if patient is high-risk. This recommendation follows the consensus statement: Guidelines for Management of Incidental Pulmonary Nodules Detected on CT Images: From the Fleischner Society 2017; Radiology 2017; 284:228-243. 3. 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 4. Aortic atherosclerosis. 5. **An incidental finding of potential clinical significance has been found. Ascending thoracic aortic 4.2 cm aneurysm, new since 2012. Recommend annual imaging followup by CTA or MRA. This recommendation follows 2010 ACCF/AHA/AATS/ACR/ASA/SCA/SCAI/SIR/STS/SVM Guidelines for the Diagnosis and Management of Patients with Thoracic  Aortic Disease. Circulation. 2010; 121: J825-K539. ** 6. Left main and 3 vessel coronary atherosclerosis.   Electronically Signed   By: Ilona Sorrel M.D.   On: 07/03/2016 10:38      OV 02/15/17  Chief Complaint  Patient presents with   Follow-up    Pt states she is doing well since starting pulmonary rehab. Pt  denies cough, CP/tightness, cough.    Follow-up dyspnea due to obesity and diastolic dysfunction: She attended pulmonary habitation and this helped significantly. Now doing yard work. She and her husband are thankful. A few months ago she did have the flu was given Z-Pak. She did have some pleuritic chest pain at that time. It is now resolved. She feels better than ever before.  Follow-up right middle lobe lung nodule.: She had questions about this. She is due for annual CT scan later this year in September 2018. Explained low risk for lung cancer.     OV 04/22/2022 -greater than 3 years since her prior visit so this new office visit  Subjective:  Patient ID: Brittany Robertson, female , DOB: 10/20/1940 , age 2 y.o. , MRN: [id number] , ADDRESS: [address] [address] [place] [address] PCP Mayra Neer, MD Patient Care Team: Mayra Neer, MD as PCP - General (Family Medicine) Sueanne Margarita, MD as PCP - Cardiology (Cardiology) Warren Danes, PA-C as Physician Assistant (Dermatology)  This Provider for this visit: Treatment Team:  Attending Provider: Brand Males, MD    04/22/2022 -   Chief Complaint  Patient presents with   Consult    Pt states she has a hacking cough and clearing her throat for years. Denies SOB.     HPI Brittany Robertson 81 y.o. -new consult for chronic cough.  Presents with husband who is getting most of the history.  Since I last saw her several years ago husband states that she has some short-term memory issues.  She also is insomnia along with 5-6 episodes of nocturia at night.  Decreased ambulation because of DJD and uses a cane.  Previously 5 years ago she did pulmonary rehab but now she cannot do it for her limited activity she does not have shortness of breath.  With the main issues that she has chronic cough.  Present for at least 1 year.  Husband believes it is getting worse with the patient states it is stable.  Within the husband says short-term memory  issues make it difficult for her to recollect.  Described as moderate.  They deny any dysphagia although the question they noticed that there is some dysphagia for pills.  There is no wheezing.  Shortness of breath is undetermined.  However there is a lot of postnasal drainage she is using a lot of Kleenex.  She is on antihypertensive Benicar.  It is mostly dry.  Daytime worse the night but when she gets up to go to the bathroom at night she does cough.  There is no known acid reflux issues.  There is no known wheezing.  Last pulmonary function test 2017 April 2022 CT angiogram chest does not describe her previous nodule.  Specifically noted that no pulmonary issues but her cough is started since then.    CT Chest data April 2022 from CT angio  IMPRESSION: 1. Stable ascending thoracic aortic aneurysm measuring a maximum of 3.9 mm. No dissection. 2. Stable coronary artery calcifications. 3. No acute pulmonary findings or worrisome pulmonary lesions. 4. Aortic atherosclerosis.   Aortic Atherosclerosis (ICD10-I70.0).  Electronically Signed   By: Marijo Sanes M.D.   On: 02/14/2021 14:40   04/22/2022: OV with Dr. Chase Caller.  She had previously been seen and followed for pulmonary nodule.  Had not been seen in about 5 years and was referred back for chronic cough, having some issues with short-term memory recently.  Has been helped provide history.  Cough been present for at least a year.  Husband follows though it was progressively getting worse.  Patient felt it was relatively unchanged.  Does not have any issues with dysphagia other than pills.  No significant shortness of breath.  Reported having a lot of issues with postnasal drainage and using a lot of Kleenex.  No known acid reflux issues.  No known wheezing.  Also having some issues with insomnia and nocturia.  Discussed that cough could be from sinus drainage, acid reflux, asthma or none of these; could also be a combination and cause of  cyclical cough/LPR cough.  CBC with differential -eosinophils 300, RAST allergy panel if.  HRCT supine and prone without evidence of ILD; there were some mild air trapping.  CT sinus without contrast with clear sinuses.  Full PFT.  06/01/2022: OV with Cobb NP for follow-up after undergoing pulmonary function testing.  Today, she reports that her cough is relatively unchanged.  It is primarily worse during the day and is dry, sometimes hacking.  She does not have any trouble with her breathing for the most part, sometimes will get winded with longer distances.  No wheezing or chest tightness.  She does still have some significant postnasal drip for which she uses saline nasal sprays for.  No significant GERD symptoms.  PFTs showed very mild obstruction with reversibility, consistent with asthma.  She also has air trapping on recent imaging and elevated eosinophils. FeNO nl today.  Suspect cough is multifactorial.  Regardless, we will trial her on ICS/LABA combo.  If she has good response to this and is stable for some time, can consider changing to as needed usage.  Asthma action plan discussed. CT sinus was clear.  Allergen panel was negative.  She does have some elevated eosinophils on CBC and has a history of allergy type symptoms.  We will start her on intranasal steroid for better postnasal drainage control.  07/13/2022: Today - follow up Patient presents today for follow up with her husband. She reports that her cough has been better since she was here last. She was trying to do 2 puffs Twice daily initially but this caused some throat irritation so she's doing puff twice a day. Her cough is almost gone; she still occasionally has a throat tickle with a dry cough but this is minimal. Breathing stable. No significant wheezing or chest congestion. Her nasal congestion and post nasal drip are better with flonase. No concerns or complaints today.   TEST/EVENTS:  05/19/2022 HRCT chest: Atherosclerosis.  No LAD.   There is no evidence of ILD.  There is air trapping present indicative of small airways disease.  There is a splenic artery aneurysm which is similar when compared to prior. 05/19/2022 CT sinus: Clear sinuses and nasal cavity 06/01/2022 PFTs: FVC 98, FEV1 106, ratio 80, TLC 102, DLCOcor 89. Positive BD (12%).    OV 10/08/2022  Subjective:  Patient ID: Brittany Robertson, female , DOB: [birth year] , age 67 y.o. , MRN: [id number] , ADDRESS: [address] [address] [place] [address] PCP Mayra Neer, MD Patient Care Team: Mayra Neer, MD as PCP - General (  Family Medicine) Sueanne Margarita, MD as PCP - Cardiology (Cardiology) Warren Danes, PA-C as Physician Assistant (Dermatology)  This Provider for this visit: Treatment Team:  Attending Provider: Brand Males, MD    10/08/2022 -   Chief Complaint  Patient presents with   Follow-up    3 mos f/up  Chronic cough: Likely cough variant asthma with mild component of cough neuropathy.   HPI Brittany Robertson 81 y.o. -presents with her husband.  Husband is also my patient.  He says he is doing well.  Patient is also doing well.  Husband is giving most of the history.  The but they both acknowledge that the cough is actually better.  It is a level 1 out of 5.  She is taking her Symbicort only 1 puff once daily.  She does have some mild residual cough it does not wake her up.  She does clear her throat.  But definitely the Symbicort has helped her.  This was documented by the nurse practitioner as well.  There are no new issues.  She does take her Flonase.  We talked about adding gabapentin but took a shared decision making to the risk was not worth the benefit especially given just mild residual cough.      Dr Lorenza Cambridge Reflu - x Symptom Index (> 13-15 suggestive of LPR cough) 0 -> 5  =  none ->severe problem 04/22/2022   Hoarseness of problem with voice 0  Clearing  Of Throat 2  Excess throat mucus or feeling of post nasal drip 2   Difficulty swallowing food, liquid or tablets 0  Cough after eating or lying down 2 pills  Breathing difficulties or choking episodes 0  Troublesome or annoying cough 2  Sensation of something sticking in throat or lump in throat 0  Heartburn, chest pain, indigestion, or stomach acid coming up 1  TOTAL 9       PFT     Latest Ref Rng & Units 06/01/2022    9:34 AM 05/25/2016    9:31 AM  PFT Results  FVC-Pre L 2.43  2.59   FVC-Predicted Pre % 98  94   FVC-Post L 2.64  2.68   FVC-Predicted Post % 106  98   Pre FEV1/FVC % % 80  79   Post FEV1/FCV % % 83  84   FEV1-Pre L 1.95  2.05   FEV1-Predicted Pre % 106  100   FEV1-Post L 2.19  2.24   DLCO uncorrected ml/min/mmHg 16.20  14.85   DLCO UNC% % 87  61   DLCO corrected ml/min/mmHg 16.62    DLCO COR %Predicted % 89    DLVA Predicted % 88  78   TLC L 5.18  4.71   TLC % Predicted % 102  93   RV % Predicted % 101  80        has a past medical history of Ascending aortic aneurysm (HCC), Bruises easily, Chronic diastolic CHF (congestive heart failure) (HCC), Complication of anesthesia, Coronary artery disease, DDD (degenerative disc disease), cervical, Dilated aortic root (HCC), Dysrhythmia, GERD (gastroesophageal reflux disease), History of kidney stones, Hyperlipidemia, Hypertension, Left ureteral stone, OA (osteoarthritis), Obesity, Osteopenia, PONV (postoperative nausea and vomiting), PVC's (premature ventricular contractions), and SOB (shortness of breath) (07/13/2016).   reports that she has never smoked. She has been exposed to tobacco smoke. She has never used smokeless tobacco.  Past Surgical History:  Procedure Laterality Date   ABDOMINAL HYSTERECTOMY  APPENDECTOMY  1957   arthroscopic knee  05/2002,04/07/2005   right and then also torn muscle surgery on right knee (2003(   arthroscopic knee  01/02/2005,09/21/2006   left   CARDIAC CATHETERIZATION N/A 08/31/2016   Procedure: Right/Left Heart Cath and Coronary  Angiography;  Surgeon: Sherren Mocha, MD;  Location: [facility];  Service: Cardiovascular;  Laterality: N/A;   CHOLECYSTECTOMY  1988   COLONOSCOPY  2010   with Endo   CORONARY ANGIOPLASTY  02/05/2004   CYST REMOVAL HAND  2000   CYSTOSCOPY/URETEROSCOPY/HOLMIUM LASER/STENT PLACEMENT Left 08/26/2018   Procedure: CYSTOSCOPY/RETROGRADE/URETEROSCOPY/HOLMIUM LASER/ STONE BASKETRY/ STENT PLACEMENT;  Surgeon: Ceasar Mons, MD;  Location: [facility];  Service: Urology;  Laterality: Left;  ONLY NEEDS  45 MIN   CYSTOSCOPY/URETEROSCOPY/HOLMIUM LASER/STENT PLACEMENT Bilateral 09/17/2020   Procedure: CYSTOSCOPY/URETEROSCOPY/HOLMIUM LASER/STENT PLACEMENT;  Surgeon: Ceasar Mons, MD;  Location: [facility];  Service: Urology;  Laterality: Bilateral;   DILATION AND CURETTAGE OF UTERUS  1972   EXTRACORPOREAL SHOCK WAVE LITHOTRIPSY Right 12/12/2018   Procedure: EXTRACORPOREAL SHOCK WAVE LITHOTRIPSY (ESWL);  Surgeon: Ceasar Mons, MD;  Location: [facility];  Service: Urology;  Laterality: Right;   EYE SURGERY  01/2007   bilateral cataract with lens implant   FOOT SURGERY  01/2012   left and right bunion and hammer toe   HAND SURGERY  05/29/2011   joint removed   [place]  02/2010   incisional hernioa   KNEE ARTHROSCOPY     LEFT HEART CATHETERIZATION WITH CORONARY ANGIOGRAM N/A 09/19/2013   Procedure: LEFT HEART CATHETERIZATION WITH CORONARY ANGIOGRAM;  Surgeon: Sueanne Margarita, MD;  Location: [facility];  Service: Cardiovascular;  Laterality: N/A;   neck injections  03/2014   back and neck injections from Dr. Nelva Bush   stents     x1   tear duct surgery  1990's   bilateral   TONSILLECTOMY     as child   TOTAL KNEE ARTHROPLASTY Left 01/04/2015   Procedure: LEFT TOTAL KNEE ARTHROPLASTY;  Surgeon: Sydnee Cabal, MD;  Location: [facility];  Service: Orthopedics;  Laterality: Left;   TOTAL KNEE ARTHROPLASTY Right 05/10/2015    Procedure: TOTAL RIGHT KNEE ARTHROPLASTY;  Surgeon: Sydnee Cabal, MD;  Location: [facility];  Service: Orthopedics;  Laterality: Right;    Allergies  Allergen Reactions   Codeine Nausea And Vomiting   Sulfa Antibiotics Hives and Itching   Sulfasalazine Itching and Hives   Adhesive [Tape] Itching, Rash and Other (See Comments)    Skin irritation   Atorvastatin Rash and Other (See Comments)    GENERIC causes severe skin rash but can take Brand Name   Other Rash    Surgical tape   Penicillins Hives, Itching and Rash    Has patient had a PCN reaction causing immediate rash, facial/tongue/throat swelling, SOB or lightheadedness with hypotension: Yes Has patient had a PCN reaction causing severe rash involving mucus membranes or skin necrosis: Yes Has patient had a PCN reaction that required hospitalization No Has patient had a PCN reaction occurring within the last 10 years: No If all of the above answers are "NO", then may proceed with Cephalosporin use.     Immunization History  Administered Date(s) Administered   Fluad Quad(high Dose 65+) 07/13/2022   Influenza, High Dose Seasonal PF 06/29/2016   Influenza,inj,Quad PF,6+ Mos 05/20/2015   Pneumococcal Conjugate-13 10/19/2013   Unspecified SARS-COV-2 Vaccination 02/14/2021    Family History  Problem Relation Age  of Onset   Hypertension Mother    Cancer Mother    Hypertension Father    Heart Problems Father    Breast cancer Neg Hx      Current Outpatient Medications:    budesonide-formoterol (SYMBICORT) 80-4.5 MCG/ACT inhaler, Inhale 2 puffs into the lungs in the morning and at bedtime. (Patient taking differently: Inhale 1 puff into the lungs in the morning and at bedtime. Twice a day), Disp: 1 each, Rfl: 5   cetirizine (ZYRTEC) 10 MG tablet, Take 10 mg by mouth every morning., Disp: , Rfl:    fluticasone (FLONASE) 50 MCG/ACT nasal spray, Place 2 sprays into both nostrils daily., Disp: 18.2 mL, Rfl: 2   acetaminophen (TYLENOL)  500 MG tablet, Take 500-1,000 mg by mouth every 6 (six) hours as needed for moderate pain or headache. , Disp: , Rfl:    aspirin 81 MG tablet, Take 81 mg by mouth every evening. , Disp: , Rfl:    betamethasone dipropionate (DIPROLENE) 0.05 % ointment, Apply topically 2 (two) times daily as needed (Rash)., Disp: 45 g, Rfl: 3   cholecalciferol (VITAMIN D) 400 UNITS TABS, Take 400 Units by mouth 2 (two) times daily., Disp: , Rfl:    denosumab (PROLIA) 60 MG/ML SOLN injection, Inject 60 mg into the skin every 6 (six) months. Administer in upper arm, thigh, or abdomen, Disp: , Rfl:    docusate sodium (COLACE) 100 MG capsule, Take 100 mg by mouth 2 (two) times daily., Disp: , Rfl:    ezetimibe (ZETIA) 10 MG tablet, Take 1 tablet (10 mg total) by mouth every evening., Disp: 90 tablet, Rfl: 3   famotidine (PEPCID) 20 MG tablet, Take one tablet by mouth daily as needed, Disp: 90 tablet, Rfl: 3   fluocinonide gel (LIDEX) 0.05 %, Apply topically., Disp: , Rfl:    FLUZONE HIGH-DOSE QUADRIVALENT 0.7 ML SUSY, , Disp: , Rfl:    furosemide (LASIX) 20 MG tablet, TAKE 1 TABLET BY MOUTH  DAILY AS NEEDED FOR FLUID  OR EDEMA, Disp: 90 tablet, Rfl: 3   Melatonin 5 MG CAPS, Take 5 mg by mouth at bedtime., Disp: , Rfl:    meloxicam (MOBIC) 15 MG tablet, Take 15 mg by mouth every evening., Disp: , Rfl:    Menthol, Topical Analgesic, (ICY HOT) 16 % LIQD, Apply topically., Disp: , Rfl:    methocarbamol (ROBAXIN) 500 MG tablet, Take 500 mg by mouth 4 (four) times daily as needed for muscle spasms (cramps)., Disp: , Rfl:    metoprolol succinate (TOPROL-XL) 25 MG 24 hr tablet, Take 0.5 tablets (12.5 mg total) by mouth daily., Disp: 45 tablet, Rfl: 3   mupirocin ointment (BACTROBAN) 2 %, Apply 1 application topically 2 (two) times daily., Disp: 22 g, Rfl: 6   MYRBETRIQ 50 MG TB24 tablet, Take 50 mg by mouth at bedtime., Disp: , Rfl:    nitroGLYCERIN (NITROSTAT) 0.4 MG SL tablet, Place 1 tablet (0.4 mg total) under the tongue  every 5 (five) minutes as needed. As needed for chest pain x 3 doses (Patient taking differently: Place 0.4 mg under the tongue every 5 (five) minutes as needed for chest pain.), Disp: 25 tablet, Rfl: 0   olmesartan (BENICAR) 40 MG tablet, TAKE 1 TABLET BY MOUTH  DAILY, Disp: 90 tablet, Rfl: 3   polyethylene glycol (MIRALAX / GLYCOLAX) 17 g packet, Take 17 g by mouth daily as needed., Disp: , Rfl:    Probiotic Product (PROBIOTIC-10 PO), Take 1 tablet by mouth daily at  6 (six) AM., Disp: , Rfl:    rosuvastatin (CRESTOR) 20 MG tablet, Take 20 mg by mouth daily., Disp: , Rfl:    sodium chloride (OCEAN) 0.65 % SOLN nasal spray, Place 1 spray into both nostrils as needed for congestion., Disp: , Rfl:    triamcinolone cream (KENALOG) 0.1 %, Apply 1 application. topically daily., Disp: 80 g, Rfl: 11   Vitamin D, Cholecalciferol, 10 MCG (400 UNIT) CAPS, Take 400 Int'l Units/1.28m by mouth every Monday, Wednesday, and Friday. (Patient not taking: Reported on 07/13/2022), Disp: , Rfl:       Objective:   Vitals:   10/08/22 1010  BP: 120/62  Pulse: 61  Temp: 98 F (36.7 C)  TempSrc: Oral  SpO2: 98%  Weight: 207 lb 9.6 oz (94.2 kg)  Height: '5\' 6"'$  (1.676 m)    Estimated body mass index is 33.51 kg/m as calculated from the following:   Height as of this encounter: '5\' 6"'$  (1.676 m).   Weight as of this encounter: 207 lb 9.6 oz (94.2 kg).  '@WEIGHTCHANGE'$ @  [facility]  10/08/22 1010  Weight: 207 lb 9.6 oz (94.2 kg)     Physical Exam   General: No distress. Looks well.  Neuro: Alert and Oriented x 3. GCS 15. Speech normal Psych: Pleasant Resp:  Barrel Chest - no.  Wheeze - no, Crackles - no, No overt respiratory distress CVS: Normal heart sounds. Murmurs - no Ext: Stigmata of Connective Tissue Disease - no HEENT: Normal upper airway. PEERL +. No post nasal drip        Assessment:       ICD-10-CM   1. Chronic cough  R05.3     2. Cough variant asthma  J45.991     3. Chronic  throat clearing  R09.89          Plan:     Patient Instructions     ICD-10-CM   1. Chronic cough  R05.3     2. Cough variant asthma  J45.991     3. Chronic throat clearing  R09.89      Cough much improved with symbicort even at once a day REsidual cough is mild and due to cough neuropathy   Plan  -continue symbicort but increase to 2 puff twice daily  - continue albuterol as needed - shared decision making to avoid gabapentin for cough due to side effect profile and mild nature of cough   #Followup - DR RChase CallerIn 6-9 months    SIGNATURE    Dr. MBrand Males M.D., F.C.C.P,  Pulmonary and Critical Care Medicine Staff Physician, CTiburonesDirector - Interstitial Lung Disease  Program  [place] [address] Pager: [phone] If no answer or between  15:00h - 7:00h: call [phone] Telephone: [phone]  10:55 AM 10/08/2022

## 2022-10-08 NOTE — Patient Instructions (Addendum)
ICD-10-CM   1. Chronic cough  R05.3     2. Cough variant asthma  J45.991     3. Chronic throat clearing  R09.89      Cough much improved with symbicort even at once a day REsidual cough is mild and due to cough neuropathy   Plan  -continue symbicort but increase to 2 puff twice daily  - continue albuterol as needed - shared decision making to avoid gabapentin for cough due to side effect profile and mild nature of cough   #Followup - DR Chase Caller In 6-9 months

## 2022-10-24 ENCOUNTER — Encounter (HOSPITAL_BASED_OUTPATIENT_CLINIC_OR_DEPARTMENT_OTHER): Payer: Self-pay | Admitting: Cardiology

## 2022-10-26 MED ORDER — ROSUVASTATIN CALCIUM 20 MG PO TABS: 20.0000 mg | ORAL_TABLET | Freq: Every day | ORAL | 2 refills | Status: DC

## 2022-10-29 DIAGNOSIS — M5416 Radiculopathy, lumbar region: Secondary | ICD-10-CM | POA: Diagnosis not present

## 2022-10-30 DIAGNOSIS — D1801 Hemangioma of skin and subcutaneous tissue: Secondary | ICD-10-CM | POA: Diagnosis not present

## 2022-10-30 DIAGNOSIS — L821 Other seborrheic keratosis: Secondary | ICD-10-CM | POA: Diagnosis not present

## 2022-10-30 DIAGNOSIS — L570 Actinic keratosis: Secondary | ICD-10-CM | POA: Diagnosis not present

## 2022-10-30 DIAGNOSIS — D2272 Melanocytic nevi of left lower limb, including hip: Secondary | ICD-10-CM | POA: Diagnosis not present

## 2022-10-30 DIAGNOSIS — L820 Inflamed seborrheic keratosis: Secondary | ICD-10-CM | POA: Diagnosis not present

## 2022-11-05 DIAGNOSIS — M5412 Radiculopathy, cervical region: Secondary | ICD-10-CM | POA: Diagnosis not present

## 2022-12-07 ENCOUNTER — Encounter: Payer: Self-pay | Admitting: Podiatry

## 2022-12-07 ENCOUNTER — Ambulatory Visit: Payer: Medicare Other | Admitting: Podiatry

## 2022-12-07 DIAGNOSIS — M79674 Pain in right toe(s): Secondary | ICD-10-CM

## 2022-12-07 DIAGNOSIS — B351 Tinea unguium: Secondary | ICD-10-CM

## 2022-12-07 NOTE — Progress Notes (Signed)
This patient presents  to the office for evaluation and treatment of long thick painful nails .  This patient is unable to trim her own nails since the patient cannot reach her feet.  Patient says the nails are painful walking and wearing his shoes.  He returns for preventive foot care services.  General Appearance  Alert, conversant and in no acute stress.  Vascular  Dorsalis pedis and posterior tibial  pulses are palpable  bilaterally.  Capillary return is within normal limits  bilaterally. Temperature is within normal limits  bilaterally.  Neurologic  Senn-Weinstein monofilament wire test within normal limits  bilaterally. Muscle power within normal limits bilaterally.  Nails Thick disfigured discolored nails with subungual debris  3,4 and 5 toenails  right. No evidence of bacterial infection or drainage bilaterally.  Orthopedic  No limitations of motion  feet .  No crepitus or effusions noted.  No bony pathology or digital deformities noted. HAV  B/L.  Hammer toes 2-5  B/L.  Skin  normotropic skin with no porokeratosis noted bilaterally.  No signs of infections or ulcers noted.     Onychomycosis  Pain in toes right foot    Debridement  of nails  1-5  B/L with a nail nipper.  Nails were then filed using a dremel tool with no incidents.    RTC  12 weeks    Gardiner Barefoot DPM

## 2022-12-23 DIAGNOSIS — I7121 Aneurysm of the ascending aorta, without rupture: Secondary | ICD-10-CM | POA: Diagnosis not present

## 2022-12-23 DIAGNOSIS — J42 Unspecified chronic bronchitis: Secondary | ICD-10-CM | POA: Diagnosis not present

## 2022-12-23 DIAGNOSIS — I110 Hypertensive heart disease with heart failure: Secondary | ICD-10-CM | POA: Diagnosis not present

## 2022-12-23 DIAGNOSIS — M8580 Other specified disorders of bone density and structure, unspecified site: Secondary | ICD-10-CM | POA: Diagnosis not present

## 2022-12-23 DIAGNOSIS — R7301 Impaired fasting glucose: Secondary | ICD-10-CM | POA: Diagnosis not present

## 2022-12-23 DIAGNOSIS — Z0000 Encounter for general adult medical examination without abnormal findings: Secondary | ICD-10-CM | POA: Diagnosis not present

## 2022-12-23 DIAGNOSIS — M150 Primary generalized (osteo)arthritis: Secondary | ICD-10-CM | POA: Diagnosis not present

## 2022-12-23 DIAGNOSIS — E782 Mixed hyperlipidemia: Secondary | ICD-10-CM | POA: Diagnosis not present

## 2022-12-23 DIAGNOSIS — R911 Solitary pulmonary nodule: Secondary | ICD-10-CM | POA: Diagnosis not present

## 2022-12-23 DIAGNOSIS — I5030 Unspecified diastolic (congestive) heart failure: Secondary | ICD-10-CM | POA: Diagnosis not present

## 2022-12-23 LAB — LAB REPORT - SCANNED
A1c: 6
Microalb Creat Ratio: 20.3

## 2023-02-16 DIAGNOSIS — M5416 Radiculopathy, lumbar region: Secondary | ICD-10-CM | POA: Diagnosis not present

## 2023-03-02 DIAGNOSIS — M5412 Radiculopathy, cervical region: Secondary | ICD-10-CM | POA: Diagnosis not present

## 2023-03-03 DIAGNOSIS — M19071 Primary osteoarthritis, right ankle and foot: Secondary | ICD-10-CM | POA: Diagnosis not present

## 2023-03-03 DIAGNOSIS — M67961 Unspecified disorder of synovium and tendon, right lower leg: Secondary | ICD-10-CM | POA: Diagnosis not present

## 2023-03-03 DIAGNOSIS — M79671 Pain in right foot: Secondary | ICD-10-CM | POA: Diagnosis not present

## 2023-03-08 ENCOUNTER — Encounter: Payer: Self-pay | Admitting: Podiatry

## 2023-03-08 ENCOUNTER — Ambulatory Visit: Payer: Medicare Other | Admitting: Podiatry

## 2023-03-08 DIAGNOSIS — B351 Tinea unguium: Secondary | ICD-10-CM

## 2023-03-08 DIAGNOSIS — M79674 Pain in right toe(s): Secondary | ICD-10-CM | POA: Diagnosis not present

## 2023-03-08 NOTE — Progress Notes (Signed)
This patient presents  to the office for evaluation and treatment of long thick painful nails .  This patient is unable to trim her own nails since the patient cannot reach her feet.  Patient says the nails are painful walking and wearing his shoes.  He returns for preventive foot care services.  General Appearance  Alert, conversant and in no acute stress.  Vascular  Dorsalis pedis and posterior tibial  pulses are palpable  bilaterally.  Capillary return is within normal limits  bilaterally. Temperature is within normal limits  bilaterally.  Neurologic  Senn-Weinstein monofilament wire test within normal limits  bilaterally. Muscle power within normal limits bilaterally.  Nails Thick disfigured discolored nails with subungual debris  3,4 and 5 toenails  right. No evidence of bacterial infection or drainage bilaterally.  Orthopedic  No limitations of motion  feet .  No crepitus or effusions noted.  No bony pathology or digital deformities noted. HAV  B/L.  Hammer toes 2-5  B/L.  Skin  normotropic skin with no porokeratosis noted bilaterally.  No signs of infections or ulcers noted.     Onychomycosis  Pain in toes right foot    Debridement  of nails  1-5  B/L with a nail nipper.  Nails were then filed using a dremel tool with no incidents.    RTC  12 weeks    Nameer Summer DPM   

## 2023-03-11 ENCOUNTER — Other Ambulatory Visit: Payer: Self-pay | Admitting: Family Medicine

## 2023-03-11 DIAGNOSIS — Z1231 Encounter for screening mammogram for malignant neoplasm of breast: Secondary | ICD-10-CM

## 2023-04-02 DIAGNOSIS — N3020 Other chronic cystitis without hematuria: Secondary | ICD-10-CM | POA: Diagnosis not present

## 2023-04-02 DIAGNOSIS — N200 Calculus of kidney: Secondary | ICD-10-CM | POA: Diagnosis not present

## 2023-04-05 ENCOUNTER — Encounter: Payer: Self-pay | Admitting: Cardiology

## 2023-04-05 MED ORDER — NITROGLYCERIN 0.4 MG SL SUBL
0.4000 mg | SUBLINGUAL_TABLET | SUBLINGUAL | 5 refills | Status: AC | PRN
Start: 2023-04-05 — End: ?

## 2023-04-05 MED ORDER — EZETIMIBE 10 MG PO TABS: 10.0000 mg | ORAL_TABLET | Freq: Every evening | ORAL | 1 refills | Status: DC

## 2023-04-05 MED ORDER — METOPROLOL SUCCINATE ER 25 MG PO TB24: 12.5000 mg | ORAL_TABLET | Freq: Every day | ORAL | 1 refills | Status: DC

## 2023-04-05 MED ORDER — ROSUVASTATIN CALCIUM 20 MG PO TABS: 20.0000 mg | ORAL_TABLET | Freq: Every day | ORAL | 1 refills | Status: DC

## 2023-04-06 ENCOUNTER — Ambulatory Visit: Payer: Medicare Other | Admitting: Internal Medicine

## 2023-04-06 ENCOUNTER — Telehealth: Payer: Self-pay | Admitting: Pharmacist

## 2023-04-06 ENCOUNTER — Encounter: Payer: Self-pay | Admitting: Internal Medicine

## 2023-04-06 VITALS — BP 118/64 | HR 55 | Temp 98.1°F | Ht 66.0 in | Wt 207.0 lb

## 2023-04-06 DIAGNOSIS — J45991 Cough variant asthma: Secondary | ICD-10-CM | POA: Diagnosis not present

## 2023-04-06 DIAGNOSIS — R0989 Other specified symptoms and signs involving the circulatory and respiratory systems: Secondary | ICD-10-CM | POA: Diagnosis not present

## 2023-04-06 DIAGNOSIS — R053 Chronic cough: Secondary | ICD-10-CM

## 2023-04-06 DIAGNOSIS — Z7185 Encounter for immunization safety counseling: Secondary | ICD-10-CM

## 2023-04-06 MED ORDER — FLUTICASONE FUROATE-VILANTEROL 100-25 MCG/ACT IN AEPB: 1.0000 | INHALATION_SPRAY | Freq: Every day | RESPIRATORY_TRACT | 5 refills | Status: DC

## 2023-04-06 MED ORDER — FLUTICASONE FUROATE-VILANTEROL 100-25 MCG/ACT IN AEPB: 1.0000 | INHALATION_SPRAY | Freq: Every day | RESPIRATORY_TRACT | Status: DC

## 2023-04-06 NOTE — Telephone Encounter (Signed)
Please start BIV for Breo and route back to Dr. Marchelle Gearing if PA approved so that he may send rx into pharmacy. If Breo denied, please advise on preferred alternatives in ICS/LABA class. Thanks!  Chesley Mires, PharmD, MPH, BCPS, CPP Clinical Pharmacist (Rheumatology and Pulmonology)

## 2023-04-06 NOTE — Patient Instructions (Addendum)
Chronic cough Cough variant asthma Chronic throat clearing     Cough much improved and well controlled with symbicort  REsidual cough is mild and due to cough neuropathy Symbicort Is posing some physical inconvenience esp as memory loss has started  Plan  -stop symbicort - start BREO low dose 1 puff once daily  - continue albuterol as needed  Vaccine counseling  PLAN - flu shot in fall - ensure you get or have gotten RSV vaccine (not in our chart) - Please talk to PCP Lupita Raider, MD -  and ensure you get  shingrix (GSK) inactivated vaccine against shingles    #Followup - DR Marchelle Gearing In 6-9 months

## 2023-04-06 NOTE — Progress Notes (Signed)
Subjective:     Patient ID: Brittany Robertson, female   DOB: [birth year], 82 y.o.   MRN: [id number]  HPI  PCP Mayra Neer, MD   HPI   IOV 06/29/2016  Chief Complaint  Patient presents with   Pulmonary Consult    Pt referred by Dr. Fransico Him for abnormal PFT. Pt c/o DOE with little activity, dry cough and left upper chest pain when SOB. Pt saw cardiology in 04/2016 and did stress test and echo.      Brittany Robertson is a 82 y.o. female who is a resident of [place]. She presents with a husband because of insidious onset of shortness of breath for last 9 months. She tells me that in 2005 she had similar shortness of breath and at that time had coronary stent placed and shortness of breath resolved. Then this time dyspnea has recurred some 6-9 months ago. Since then it is been progressive. It is a moderate intensity. It is associated with a mild dry cough. This also progressive. She went to cardiologist Dr. Radford Pax who did cardiac stress test 05/14/2016 and this was low risk. Patient then had pulmonary function test which showed isolated reduction in diffusion capacity of 14.85/61%. Spirometry and total lung capacity normal. I personally visualized the trace. Chest x-ray done 05/25/2016 that I personally visualized shows possibility of interstitial lung disease. She does have a CT chest in 2012 done during the time of trauma that shows some infiltrates. She and her husband now wants this evaluated. Most recent hemoglobin 04/24/2016 is normal at 13.1 g percent. Creatinine is normal at 0.76 mg percent.    OV 08/10/2016  Chief Complaint  Patient presents with   Follow-up    Pt here after HRCT and CPST. Pt states she is still SOB, no change. Pt c/o occassional dry cough and upper left chest pain. Pt denies f/c/s and swelling.    Follow-up dyspnea  Here with her husband review high-resolution CT chest documented below and pulmonary stress test done 07/29/2016. Pulmonary stress test  results reviewed. She was wheezing at the outset. She had dyspnea then exercise. She given adequate exercise. Etiology of dyspnea is likely obesity and possible diastolic dysfunction but mostly obesity. This is based on Ve. there was no evidence of exercise-induced spasm. Today we did exhaled nitric oxide and this was normal at 10. In discussing the results husband reported that several years ago she had upper endoscopy for a sensation of frog in the throat and was referred to Dr. Janace Hoard and ENT and something is found in the vocal . He also admits to significant snoring of the wife but no nighttime apnea spells. There is a definite fatigue but no excessive daytime somnolence. They're wondering about possible sleep apnea.      IMPRESSION: 1. No evidence of interstitial lung disease. 2. Right middle lobe 4 mm solid pulmonary nodule, new since 09/27/2011. No follow-up needed if patient is low-risk. Non-contrast chest CT can be considered in 12 months if patient is high-risk. This recommendation follows the consensus statement: Guidelines for Management of Incidental Pulmonary Nodules Detected on CT Images: From the Fleischner Society 2017; Radiology 2017; 284:228-243. 3. 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 4. Aortic atherosclerosis. 5. **An incidental finding of potential clinical significance has been found. Ascending thoracic aortic 4.2 cm aneurysm, new since 2012. Recommend annual imaging followup by CTA or MRA. This recommendation follows 2010 ACCF/AHA/AATS/ACR/ASA/SCA/SCAI/SIR/STS/SVM Guidelines for the Diagnosis and Management of Patients with Thoracic  Aortic Disease. Circulation. 2010; 121: J825-K539. ** 6. Left main and 3 vessel coronary atherosclerosis.   Electronically Signed   By: Ilona Sorrel M.D.   On: 07/03/2016 10:38      OV 02/15/17  Chief Complaint  Patient presents with   Follow-up    Pt states she is doing well since starting pulmonary rehab. Pt  denies cough, CP/tightness, cough.    Follow-up dyspnea due to obesity and diastolic dysfunction: She attended pulmonary habitation and this helped significantly. Now doing yard work. She and her husband are thankful. A few months ago she did have the flu was given Z-Pak. She did have some pleuritic chest pain at that time. It is now resolved. She feels better than ever before.  Follow-up right middle lobe lung nodule.: She had questions about this. She is due for annual CT scan later this year in September 2018. Explained low risk for lung cancer.     OV 04/22/2022 -greater than 3 years since her prior visit so this new office visit  Subjective:  Patient ID: Brittany Robertson, female , DOB: 10/20/1940 , age 2 y.o. , MRN: [id number] , ADDRESS: [address] [address] [place] [address] PCP Mayra Neer, MD Patient Care Team: Mayra Neer, MD as PCP - General (Family Medicine) Sueanne Margarita, MD as PCP - Cardiology (Cardiology) Warren Danes, PA-C as Physician Assistant (Dermatology)  This Provider for this visit: Treatment Team:  Attending Provider: Brand Males, MD    04/22/2022 -   Chief Complaint  Patient presents with   Consult    Pt states she has a hacking cough and clearing her throat for years. Denies SOB.     HPI Brittany Robertson 82 y.o. -new consult for chronic cough.  Presents with husband who is getting most of the history.  Since I last saw her several years ago husband states that she has some short-term memory issues.  She also is insomnia along with 5-6 episodes of nocturia at night.  Decreased ambulation because of DJD and uses a cane.  Previously 5 years ago she did pulmonary rehab but now she cannot do it for her limited activity she does not have shortness of breath.  With the main issues that she has chronic cough.  Present for at least 1 year.  Husband believes it is getting worse with the patient states it is stable.  Within the husband says short-term memory  issues make it difficult for her to recollect.  Described as moderate.  They deny any dysphagia although the question they noticed that there is some dysphagia for pills.  There is no wheezing.  Shortness of breath is undetermined.  However there is a lot of postnasal drainage she is using a lot of Kleenex.  She is on antihypertensive Benicar.  It is mostly dry.  Daytime worse the night but when she gets up to go to the bathroom at night she does cough.  There is no known acid reflux issues.  There is no known wheezing.  Last pulmonary function test 2017 April 2022 CT angiogram chest does not describe her previous nodule.  Specifically noted that no pulmonary issues but her cough is started since then.    CT Chest data April 2022 from CT angio  IMPRESSION: 1. Stable ascending thoracic aortic aneurysm measuring a maximum of 3.9 mm. No dissection. 2. Stable coronary artery calcifications. 3. No acute pulmonary findings or worrisome pulmonary lesions. 4. Aortic atherosclerosis.   Aortic Atherosclerosis (ICD10-I70.0).  Electronically Signed   By: Marijo Sanes M.D.   On: 02/14/2021 14:40   04/22/2022: OV with Dr. Chase Caller.  She had previously been seen and followed for pulmonary nodule.  Had not been seen in about 5 years and was referred back for chronic cough, having some issues with short-term memory recently.  Has been helped provide history.  Cough been present for at least a year.  Husband follows though it was progressively getting worse.  Patient felt it was relatively unchanged.  Does not have any issues with dysphagia other than pills.  No significant shortness of breath.  Reported having a lot of issues with postnasal drainage and using a lot of Kleenex.  No known acid reflux issues.  No known wheezing.  Also having some issues with insomnia and nocturia.  Discussed that cough could be from sinus drainage, acid reflux, asthma or none of these; could also be a combination and cause of  cyclical cough/LPR cough.  CBC with differential -eosinophils 300, RAST allergy panel if.  HRCT supine and prone without evidence of ILD; there were some mild air trapping.  CT sinus without contrast with clear sinuses.  Full PFT.  06/01/2022: OV with Cobb NP for follow-up after undergoing pulmonary function testing.  Today, she reports that her cough is relatively unchanged.  It is primarily worse during the day and is dry, sometimes hacking.  She does not have any trouble with her breathing for the most part, sometimes will get winded with longer distances.  No wheezing or chest tightness.  She does still have some significant postnasal drip for which she uses saline nasal sprays for.  No significant GERD symptoms.  PFTs showed very mild obstruction with reversibility, consistent with asthma.  She also has air trapping on recent imaging and elevated eosinophils. FeNO nl today.  Suspect cough is multifactorial.  Regardless, we will trial her on ICS/LABA combo.  If she has good response to this and is stable for some time, can consider changing to as needed usage.  Asthma action plan discussed. CT sinus was clear.  Allergen panel was negative.  She does have some elevated eosinophils on CBC and has a history of allergy type symptoms.  We will start her on intranasal steroid for better postnasal drainage control.  07/13/2022: Today - follow up Patient presents today for follow up with her husband. She reports that her cough has been better since she was here last. She was trying to do 2 puffs Twice daily initially but this caused some throat irritation so she's doing puff twice a day. Her cough is almost gone; she still occasionally has a throat tickle with a dry cough but this is minimal. Breathing stable. No significant wheezing or chest congestion. Her nasal congestion and post nasal drip are better with flonase. No concerns or complaints today.   TEST/EVENTS:  05/19/2022 HRCT chest: Atherosclerosis.  No LAD.   There is no evidence of ILD.  There is air trapping present indicative of small airways disease.  There is a splenic artery aneurysm which is similar when compared to prior. 05/19/2022 CT sinus: Clear sinuses and nasal cavity 06/01/2022 PFTs: FVC 98, FEV1 106, ratio 80, TLC 102, DLCOcor 89. Positive BD (12%).    OV 10/08/2022  Subjective:  Patient ID: Brittany Robertson, female , DOB: [birth year] , age 67 y.o. , MRN: [id number] , ADDRESS: [address] [address] [place] [address] PCP Mayra Neer, MD Patient Care Team: Mayra Neer, MD as PCP - General (  Family Medicine) Quintella Reichert, MD as PCP - Cardiology (Cardiology) Glyn Ade, PA-C as Physician Assistant (Dermatology)  This Provider for this visit: Treatment Team:  Attending Provider: Kalman Shan, MD    10/08/2022 -   Chief Complaint  Patient presents with   Follow-up    3 mos f/up  Chronic cough: Likely cough variant asthma with mild component of cough neuropathy.   HPI MARKEA HAPP 82 y.o. -presents with her husband.  Husband is also my patient.  He says he is doing well.  Patient is also doing well.  Husband is giving most of the history.  The but they both acknowledge that the cough is actually better.  It is a level 1 out of 5.  She is taking her Symbicort only 1 puff once daily.  She does have some mild residual cough it does not wake her up.  She does clear her throat.  But definitely the Symbicort has helped her.  This was documented by the nurse practitioner as well.  There are no new issues.  She does take her Flonase.  We talked about adding gabapentin but took a shared decision making to the risk was not worth the benefit especially given just mild residual cough.           OV 04/06/2023  Subjective:  Patient ID: Brittany Robertson, female , DOB: 1941-01-12 , age 34 y.o. , MRN: [id number] , ADDRESS: [address] [address] [place] [address] PCP Lupita Raider, MD Patient Care Team: Lupita Raider, MD as  PCP - General (Family Medicine) Quintella Reichert, MD as PCP - Cardiology (Cardiology) Glyn Ade, PA-C as Physician Assistant (Dermatology)  This Provider for this visit: Treatment Team:  Attending Provider: Kalman Shan, MD    04/06/2023 -   Chief Complaint  Patient presents with   Follow-up    Cough is some better. She now only has occ dry cough. No new co's.      HPI Brittany Robertson 82 y.o. -follow-up chronic cough.  Presents with her husband.  Husband is the independent historian.  In fact he is the only historian.  He says wife is having short-term memory loss and is increasingly dependent on him for care.  He is coordinating the Symbicort usage.  He does admit it is a little bit technically challenging for himself and for her.  There open to the idea of taking something more simplistic such as of Breo.  They asked [place] to price this out.  Otherwise says she is doing well.  He wants to stay up-to-date with her health.  He wants to bring her here every 6 months.  Lab review - Blood work August 2023 creatinine normal 0.88 mg percent 0 hemoglobin A1c 6.0 March 2024 -Normal blood allergy panel July 2023 -Normal hemoglobin July 2023.    Dr Gretta Cool Reflu - x Symptom Index (> 13-15 suggestive of LPR cough)  04/22/2022   Hoarseness of problem with voice 0  Clearing  Of Throat 2  Excess throat mucus or feeling of post nasal drip 2  Difficulty swallowing food, liquid or tablets 0  Cough after eating or lying down 2 pills  Breathing difficulties or choking episodes 0  Troublesome or annoying cough 2  Sensation of something sticking in throat or lump in throat 0  Heartburn, chest pain, indigestion, or stomach acid coming up 1  TOTAL 9     PFT     Latest Ref Rng & Units 06/01/2022  9:34 AM 05/25/2016    9:31 AM  PFT Results  FVC-Pre L 2.43  2.59   FVC-Predicted Pre % 98  94   FVC-Post L 2.64  2.68   FVC-Predicted Post % 106  98   Pre FEV1/FVC % % 80  79   Post  FEV1/FCV % % 83  84   FEV1-Pre L 1.95  2.05   FEV1-Predicted Pre % 106  100   FEV1-Post L 2.19  2.24   DLCO uncorrected ml/min/mmHg 16.20  14.85   DLCO UNC% % 87  61   DLCO corrected ml/min/mmHg 16.62    DLCO COR %Predicted % 89    DLVA Predicted % 88  78   TLC L 5.18  4.71   TLC % Predicted % 102  93   RV % Predicted % 101  80        has a past medical history of Ascending aortic aneurysm (HCC), Bruises easily, Chronic diastolic CHF (congestive heart failure) (HCC), Complication of anesthesia, Coronary artery disease, DDD (degenerative disc disease), cervical, Dilated aortic root (HCC), Dysrhythmia, GERD (gastroesophageal reflux disease), History of kidney stones, Hyperlipidemia, Hypertension, Left ureteral stone, OA (osteoarthritis), Obesity, Osteopenia, PONV (postoperative nausea and vomiting), PVC's (premature ventricular contractions), and SOB (shortness of breath) (07/13/2016).   reports that she has never smoked. She has been exposed to tobacco smoke. She has never used smokeless tobacco.  Past Surgical History:  Procedure Laterality Date   ABDOMINAL HYSTERECTOMY     APPENDECTOMY  1957   arthroscopic knee  05/2002,04/07/2005   right and then also torn muscle surgery on right knee (2003(   arthroscopic knee  01/02/2005,09/21/2006   left   CARDIAC CATHETERIZATION N/A 08/31/2016   Procedure: Right/Left Heart Cath and Coronary Angiography;  Surgeon: Tonny Bollman, MD;  [facility] INVASIVE CV LAB;  Service: Cardiovascular;  Laterality: N/A;   CHOLECYSTECTOMY  1988   COLONOSCOPY  2010   with Endo   CORONARY ANGIOPLASTY  02/05/2004   CYST REMOVAL HAND  2000   CYSTOSCOPY/URETEROSCOPY/HOLMIUM LASER/STENT PLACEMENT Left 08/26/2018   Procedure: CYSTOSCOPY/RETROGRADE/URETEROSCOPY/HOLMIUM LASER/ STONE BASKETRY/ STENT PLACEMENT;  Surgeon: Rene Paci, MD;  Location: [facility];  Service: Urology;  Laterality: Left;  ONLY NEEDS  45 MIN    CYSTOSCOPY/URETEROSCOPY/HOLMIUM LASER/STENT PLACEMENT Bilateral 09/17/2020   Procedure: CYSTOSCOPY/URETEROSCOPY/HOLMIUM LASER/STENT PLACEMENT;  Surgeon: Rene Paci, MD;  Location: [facility];  Service: Urology;  Laterality: Bilateral;   DILATION AND CURETTAGE OF UTERUS  1972   EXTRACORPOREAL SHOCK WAVE LITHOTRIPSY Right 12/12/2018   Procedure: EXTRACORPOREAL SHOCK WAVE LITHOTRIPSY (ESWL);  Surgeon: Rene Paci, MD;  Location: [facility];  Service: Urology;  Laterality: Right;   EYE SURGERY  01/2007   bilateral cataract with lens implant   FOOT SURGERY  01/2012   left and right bunion and hammer toe   HAND SURGERY  05/29/2011   joint removed   HEMORRHOID SURGERY  1969   HERNIA REPAIR  02/2010   incisional hernioa   KNEE ARTHROSCOPY     LEFT HEART CATHETERIZATION WITH CORONARY ANGIOGRAM N/A 09/19/2013   Procedure: LEFT HEART CATHETERIZATION WITH CORONARY ANGIOGRAM;  Surgeon: Quintella Reichert, MD;  Location: [facility];  Service: Cardiovascular;  Laterality: N/A;   neck injections  03/2014   back and neck injections from Dr. Ethelene Hal   stents     x1   tear duct surgery  1990's   bilateral   TONSILLECTOMY     as child   TOTAL KNEE ARTHROPLASTY  Left 01/04/2015   Procedure: LEFT TOTAL KNEE ARTHROPLASTY;  Surgeon: Eugenia Mcalpine, MD;  Location: [facility];  Service: Orthopedics;  Laterality: Left;   TOTAL KNEE ARTHROPLASTY Right 05/10/2015   Procedure: TOTAL RIGHT KNEE ARTHROPLASTY;  Surgeon: Eugenia Mcalpine, MD;  Location: [facility];  Service: Orthopedics;  Laterality: Right;    Allergies  Allergen Reactions   Codeine Nausea And Vomiting   Sulfa Antibiotics Hives and Itching   Sulfasalazine Itching and Hives   Adhesive [Tape] Itching, Rash and Other (See Comments)    Skin irritation   Atorvastatin Rash and Other (See Comments)    GENERIC causes severe skin rash but can take Brand Name   Other Rash    Surgical tape   Penicillins Hives, Itching and Rash    Has patient had a  PCN reaction causing immediate rash, facial/tongue/throat swelling, SOB or lightheadedness with hypotension: Yes Has patient had a PCN reaction causing severe rash involving mucus membranes or skin necrosis: Yes Has patient had a PCN reaction that required hospitalization No Has patient had a PCN reaction occurring within the last 10 years: No If all of the above answers are "NO", then may proceed with Cephalosporin use.     Immunization History  Administered Date(s) Administered   Fluad Quad(high Dose 65+) 07/13/2022   Influenza, High Dose Seasonal PF 06/29/2016   Influenza,inj,Quad PF,6+ Mos 05/20/2015   Pneumococcal Conjugate-13 10/19/2013   Unspecified SARS-COV-2 Vaccination 02/14/2021    Family History  Problem Relation Age of Onset   Hypertension Mother    Cancer Mother    Hypertension Father    Heart Problems Father    Breast cancer Neg Hx      Current Outpatient Medications:    acetaminophen (TYLENOL) 500 MG tablet, Take 500-1,000 mg by mouth every 6 (six) hours as needed for moderate pain or headache. , Disp: , Rfl:    Apoaequorin (PREVAGEN PO), Take 1 tablet by mouth daily., Disp: , Rfl:    aspirin 81 MG tablet, Take 81 mg by mouth every evening. , Disp: , Rfl:    betamethasone dipropionate (DIPROLENE) 0.05 % ointment, Apply topically 2 (two) times daily as needed (Rash)., Disp: 45 g, Rfl: 3   budesonide-formoterol (SYMBICORT) 80-4.5 MCG/ACT inhaler, Inhale 2 puffs into the lungs in the morning and at bedtime. (Patient taking differently: Inhale 1 puff into the lungs in the morning and at bedtime. Twice a day), Disp: 1 each, Rfl: 5   cetirizine (ZYRTEC) 10 MG tablet, Take 10 mg by mouth every morning., Disp: , Rfl:    cholecalciferol (VITAMIN D) 400 UNITS TABS, Take 400 Units by mouth 2 (two) times daily., Disp: , Rfl:    denosumab (PROLIA) 60 MG/ML SOLN injection, Inject 60 mg into the skin every 6 (six) months. Administer in upper arm, thigh, or abdomen, Disp: , Rfl:     docusate sodium (COLACE) 100 MG capsule, Take 100 mg by mouth 2 (two) times daily., Disp: , Rfl:    ezetimibe (ZETIA) 10 MG tablet, Take 1 tablet (10 mg total) by mouth every evening., Disp: 90 tablet, Rfl: 1   famotidine (PEPCID) 20 MG tablet, Take one tablet by mouth daily as needed, Disp: 90 tablet, Rfl: 3   fluocinonide gel (LIDEX) 0.05 %, Apply topically., Disp: , Rfl:    fluticasone (FLONASE) 50 MCG/ACT nasal spray, Place 2 sprays into both nostrils daily., Disp: 18.2 mL, Rfl: 2   FLUZONE HIGH-DOSE QUADRIVALENT 0.7 ML SUSY, , Disp: , Rfl:    furosemide (  LASIX) 20 MG tablet, TAKE 1 TABLET BY MOUTH  DAILY AS NEEDED FOR FLUID  OR EDEMA, Disp: 90 tablet, Rfl: 3   Melatonin 5 MG CAPS, Take 5 mg by mouth at bedtime., Disp: , Rfl:    meloxicam (MOBIC) 15 MG tablet, Take 15 mg by mouth every evening., Disp: , Rfl:    Menthol, Topical Analgesic, (ICY HOT) 16 % LIQD, Apply topically., Disp: , Rfl:    methocarbamol (ROBAXIN) 500 MG tablet, Take 500 mg by mouth 4 (four) times daily as needed for muscle spasms (cramps)., Disp: , Rfl:    metoprolol succinate (TOPROL-XL) 25 MG 24 hr tablet, Take 0.5 tablets (12.5 mg total) by mouth daily., Disp: 45 tablet, Rfl: 1   mupirocin ointment (BACTROBAN) 2 %, Apply 1 application topically 2 (two) times daily., Disp: 22 g, Rfl: 6   MYRBETRIQ 50 MG TB24 tablet, Take 50 mg by mouth at bedtime., Disp: , Rfl:    nitroGLYCERIN (NITROSTAT) 0.4 MG SL tablet, Place 1 tablet (0.4 mg total) under the tongue every 5 (five) minutes as needed. As needed for chest pain x 3 doses, Disp: 25 tablet, Rfl: 5   olmesartan (BENICAR) 40 MG tablet, TAKE 1 TABLET BY MOUTH  DAILY, Disp: 90 tablet, Rfl: 3   polyethylene glycol (MIRALAX / GLYCOLAX) 17 g packet, Take 17 g by mouth daily as needed., Disp: , Rfl:    Probiotic Product (PROBIOTIC-10 PO), Take 1 tablet by mouth daily at 6 (six) AM., Disp: , Rfl:    rosuvastatin (CRESTOR) 20 MG tablet, Take 1 tablet (20 mg total) by mouth daily.,  Disp: 90 tablet, Rfl: 1   sodium chloride (OCEAN) 0.65 % SOLN nasal spray, Place 1 spray into both nostrils as needed for congestion., Disp: , Rfl:    triamcinolone cream (KENALOG) 0.1 %, Apply 1 application. topically daily., Disp: 80 g, Rfl: 11   Vitamin D, Cholecalciferol, 10 MCG (400 UNIT) CAPS, Take 400 Int'l Units/1.28m2 by mouth every Monday, Wednesday, and Friday., Disp: , Rfl:       Objective:   Vitals:   04/06/23 1300  BP: 118/64  Pulse: (!) 55  Temp: 98.1 F (36.7 C)  TempSrc: Oral  SpO2: 98%  Weight: 207 lb (93.9 kg)  Height: 5\' 6"  (1.676 m)    Estimated body mass index is 33.41 kg/m as calculated from the following:   Height as of this encounter: 5\' 6"  (1.676 m).   Weight as of this encounter: 207 lb (93.9 kg).  @WEIGHTCHANGE @  [employer]   04/06/23 1300  Weight: 207 lb (93.9 kg)     Physical Exam   General: No distress. Obese,  O2 at rest: no Cane present: YES Sitting in wheel chair: no Frail: no Obese: YES Neuro: Alert and Oriented x 3. GCS 15. Speech normal Psych: Pleasant Resp:  Barrel Chest - no.  Wheeze - no, Crackles - no, No overt respiratory distress CVS: Normal heart sounds. Murmurs - no Ext: Stigmata of Connective Tissue Disease - no. CHRONCI EDEMA + HEENT: Normal upper airway. PEERL +. No post nasal drip        Assessment:       ICD-10-CM   1. Chronic cough  R05.3     2. Cough variant asthma  J45.991     3. Chronic throat clearing  R09.89     4. Vaccine counseling  Z71.85          Plan:     Patient Instructions  Chronic cough Cough  variant asthma Chronic throat clearing     Cough much improved and well controlled with symbicort  REsidual cough is mild and due to cough neuropathy Symbicort Is posing some physical inconvenience esp as memory loss has started  Plan  -stop symbicort - start BREO low dose 1 puff once daily  - continue albuterol as needed  Vaccine counseling  PLAN - flu shot in fall -  ensure you get or have gotten RSV vaccine (not in our chart) - Please talk to PCP Lupita Raider, MD -  and ensure you get  shingrix (GSK) inactivated vaccine against shingles    #Followup - DR Marchelle Gearing In 6-9 months    SIGNATURE    Dr. Kalman Shan, M.D., F.C.C.P,  Pulmonary and Critical Care Medicine Staff Physician, [facility] Director - Interstitial Lung Disease  Program  Pulmonary Fibrosis [facility] at [facility] [place], [place], [address]  Pager: [phone], If no answer or between  15:00h - 7:00h: call [phone] Telephone: [phone]  1:51 PM 04/06/2023   Moderate Complexity MDM OFFICE  2021 E/M guidelines, first released in 2021, with minor revisions added in 2023 and 2024 Must meet the requirements for 2 out of 3 dimensions to qualify.    Number and complexity of problems addressed Amount and/or complexity of data reviewed Risk of complications and/or morbidity  One or more chronic illness with mild exacerbation, OR progression, OR  side effects of treatment  Two or more stable chronic illnesses - asthma and vaccine counseline  One undiagnosed new problem with uncertain prognosis  One acute illness with systemic symptoms   One Acute complicated injury Must meet the requirements for 1 of 3 of the categories)  Category 1: Tests and documents, historian  Any combination of 3 of the following:  Assessment requiring an independent historian  Review of prior external note(s) from each unique source  Review of results of each unique test  Ordering of each unique test    Category 2: Interpretation of tests   Independent interpretation of a test performed by another physician/other qualified health care professional (not separately reported)  Category 3: Discuss management/tests  Discussion of management or test interpretation with external physician/other qualified health care  professional/appropriate source (not separately reported) Moderate risk of morbidity from additional diagnostic testing or treatment Examples only:  Prescription drug management  Decision regarding minor surgery with identfied patient or procedure risk factors  Decision regarding elective major surgery without identified patient or procedure risk factors  Diagnosis or treatment significantly limited by social determinants of health

## 2023-04-07 ENCOUNTER — Other Ambulatory Visit (HOSPITAL_COMMUNITY): Payer: Self-pay

## 2023-04-07 NOTE — Telephone Encounter (Signed)
Brittany Robertson is covered at this time showing a $47.00 co-pay per test claims.

## 2023-04-20 DIAGNOSIS — M19071 Primary osteoarthritis, right ankle and foot: Secondary | ICD-10-CM | POA: Diagnosis not present

## 2023-04-27 ENCOUNTER — Ambulatory Visit: Payer: Medicare Other

## 2023-04-28 ENCOUNTER — Ambulatory Visit
Admission: RE | Admit: 2023-04-28 | Discharge: 2023-04-28 | Disposition: A | Discharge status: Home / Self Care | Payer: Medicare Other | Source: Ambulatory Visit | Attending: Family Medicine | Admitting: Family Medicine

## 2023-04-28 DIAGNOSIS — Z1231 Encounter for screening mammogram for malignant neoplasm of breast: Secondary | ICD-10-CM

## 2023-05-03 DIAGNOSIS — M67961 Unspecified disorder of synovium and tendon, right lower leg: Secondary | ICD-10-CM | POA: Diagnosis not present

## 2023-05-03 DIAGNOSIS — M19071 Primary osteoarthritis, right ankle and foot: Secondary | ICD-10-CM | POA: Diagnosis not present

## 2023-06-07 ENCOUNTER — Ambulatory Visit: Payer: Medicare Other | Admitting: Podiatry

## 2023-06-07 ENCOUNTER — Encounter: Payer: Self-pay | Admitting: Podiatry

## 2023-06-07 DIAGNOSIS — M79674 Pain in right toe(s): Secondary | ICD-10-CM

## 2023-06-07 DIAGNOSIS — B351 Tinea unguium: Secondary | ICD-10-CM

## 2023-06-07 NOTE — Progress Notes (Signed)
This patient presents  to the office for evaluation and treatment of long thick painful nails .  This patient is unable to trim her own nails since the patient cannot reach her feet.  Patient says the nails are painful walking and wearing his shoes.  He returns for preventive foot care services.  General Appearance  Alert, conversant and in no acute stress.  Vascular  Dorsalis pedis and posterior tibial  pulses are palpable  bilaterally.  Capillary return is within normal limits  bilaterally. Temperature is within normal limits  bilaterally.  Neurologic  Senn-Weinstein monofilament wire test within normal limits  bilaterally. Muscle power within normal limits bilaterally.  Nails Thick disfigured discolored nails with subungual debris  3,4 and 5 toenails  right. No evidence of bacterial infection or drainage bilaterally.  Orthopedic  No limitations of motion  feet .  No crepitus or effusions noted.  No bony pathology or digital deformities noted. HAV  B/L.  Hammer toes 2-5  B/L.  Skin  normotropic skin with no porokeratosis noted bilaterally.  No signs of infections or ulcers noted.     Onychomycosis  Pain in toes right foot    Debridement  of nails  1-5  B/L with a nail nipper.  Nails were then filed using a dremel tool with no incidents.    RTC  12 weeks    Gardiner Barefoot DPM

## 2023-06-17 ENCOUNTER — Ambulatory Visit: Payer: Medicare Other | Attending: Cardiology | Admitting: Cardiology

## 2023-06-17 ENCOUNTER — Encounter: Payer: Self-pay | Admitting: Cardiology

## 2023-06-17 VITALS — BP 134/84 | HR 57 | Ht 66.0 in | Wt 208.8 lb

## 2023-06-17 DIAGNOSIS — I77810 Thoracic aortic ectasia: Secondary | ICD-10-CM | POA: Diagnosis not present

## 2023-06-17 DIAGNOSIS — E785 Hyperlipidemia, unspecified: Secondary | ICD-10-CM | POA: Diagnosis not present

## 2023-06-17 DIAGNOSIS — I2510 Atherosclerotic heart disease of native coronary artery without angina pectoris: Secondary | ICD-10-CM

## 2023-06-17 DIAGNOSIS — I10 Essential (primary) hypertension: Secondary | ICD-10-CM | POA: Diagnosis not present

## 2023-06-17 DIAGNOSIS — Z79899 Other long term (current) drug therapy: Secondary | ICD-10-CM

## 2023-06-17 DIAGNOSIS — I5032 Chronic diastolic (congestive) heart failure: Secondary | ICD-10-CM | POA: Diagnosis not present

## 2023-06-17 MED ORDER — METOPROLOL TARTRATE 25 MG PO TABS: 25.0000 mg | ORAL_TABLET | Freq: Once | ORAL | 0 refills | Status: AC

## 2023-06-17 NOTE — Progress Notes (Signed)
Cardiology Office Note:    Date:  06/17/2023   ID:  Robertson Brittany, DOB [birth year], MRN [id number]  PCP:  Lupita Raider, MD  Cardiologist:  Armanda Magic, MD    Referring MD: Lupita Raider, MD   Chief Complaint  Patient presents with   Coronary Artery Disease   Hypertension   Hyperlipidemia   Congestive Heart Failure    History of Present Illness:    Brittany Robertson is a 82 y.o. female with a hx of  ASCAD s/p PCI the LAD in 2006 with repeat cardiac cath in 2014 showing 30% mid LAD distal to the stent and otherwise normal coronary arteries and repeat heart cath 08/2016 showed normal coronary arteries with patent LAD stent and normal LVEDP.  Her shortness of breath is felt to be due to obesity and deconditioning..  She has a history of hypertension, obesity, chronic diastolic CHF and hyperlipidemia.  She also has a history of mild aortic aneurysm with the ascending aorta measuring 4.2 cm.  She is here today for followup and is doing well.  She denies any chest pain or pressure, SOB, DOE, PND, orthopnea, LE edema, dizziness, palpitations or syncope. She is compliant with her meds and is tolerating meds with no SE.    Past Medical History:  Diagnosis Date   Ascending aortic aneurysm (HCC)    42mm 07/2018   Bruises easily    Chronic diastolic CHF (congestive heart failure) (HCC)    Complication of anesthesia    "takes a long time to wake up"bp little low when wakes up   Coronary artery disease    PCI 2006, repeat cath 30% mid LAD distal to stent otherwise normal coronary arteries, cath 08/2013 with patent LAD stent and otherwise normal coronary arteries with diastolic dysfunction. Cath 08/2016 with normal RHC, normal LVEDP, patent LAD stent with otherwise normal coronary arteries   DDD (degenerative disc disease), cervical    Dilated aortic root (HCC)    40mm by echo 07/2017   Dysrhythmia    GERD (gastroesophageal reflux disease)    History of kidney stones    Hyperlipidemia     Hypertension    Left ureteral stone    OA (osteoarthritis)    Obesity    Osteopenia    PONV (postoperative nausea and vomiting)    Motion sickness when move out of PACU to room   PVC's (premature ventricular contractions)    SOB (shortness of breath) 07/13/2016   non cardiac and secondary to deconditioning and obesity    Past Surgical History:  Procedure Laterality Date   ABDOMINAL HYSTERECTOMY     APPENDECTOMY  1957   arthroscopic knee  05/2002,04/07/2005   right and then also torn muscle surgery on right knee (2003(   arthroscopic knee  01/02/2005,09/21/2006   left   CARDIAC CATHETERIZATION N/A 08/31/2016   Procedure: Right/Left Heart Cath and Coronary Angiography;  Surgeon: Tonny Bollman, MD;  Location: [facility] INVASIVE CV LAB;  Service: Cardiovascular;  Laterality: N/A;   CHOLECYSTECTOMY  1988   COLONOSCOPY  2010   with Endo   CORONARY ANGIOPLASTY  02/05/2004   CYST REMOVAL HAND  2000   CYSTOSCOPY/URETEROSCOPY/HOLMIUM LASER/STENT PLACEMENT Left 08/26/2018   Procedure: CYSTOSCOPY/RETROGRADE/URETEROSCOPY/HOLMIUM LASER/ STONE BASKETRY/ STENT PLACEMENT;  Surgeon: Rene Paci, MD;  Location: [facility];  Service: Urology;  Laterality: Left;  ONLY NEEDS  45 MIN   CYSTOSCOPY/URETEROSCOPY/HOLMIUM LASER/STENT PLACEMENT Bilateral 09/17/2020   Procedure: CYSTOSCOPY/URETEROSCOPY/HOLMIUM LASER/STENT PLACEMENT;  Surgeon: Rene Paci, MD;  Location:  [facility];  Service: Urology;  Laterality: Bilateral;   DILATION AND CURETTAGE OF UTERUS  1972   EXTRACORPOREAL SHOCK WAVE LITHOTRIPSY Right 12/12/2018   Procedure: EXTRACORPOREAL SHOCK WAVE LITHOTRIPSY (ESWL);  Surgeon: Rene Paci, MD;  Location: [facility];  Service: Urology;  Laterality: Right;   EYE SURGERY  01/2007   bilateral cataract with lens implant   FOOT SURGERY  01/2012   left and right bunion and hammer toe   HAND SURGERY  05/29/2011   joint removed   HEMORRHOID SURGERY  1969    HERNIA REPAIR  02/2010   incisional hernioa   KNEE ARTHROSCOPY     LEFT HEART CATHETERIZATION WITH CORONARY ANGIOGRAM N/A 09/19/2013   Procedure: LEFT HEART CATHETERIZATION WITH CORONARY ANGIOGRAM;  Surgeon: Quintella Reichert, MD;  Location: [facility];  Service: Cardiovascular;  Laterality: N/A;   neck injections  03/2014   back and neck injections from Dr. Ethelene Hal   stents     x1   tear duct surgery  1990's   bilateral   TONSILLECTOMY     as child   TOTAL KNEE ARTHROPLASTY Left 01/04/2015   Procedure: LEFT TOTAL KNEE ARTHROPLASTY;  Surgeon: Eugenia Mcalpine, MD;  Location: [facility];  Service: Orthopedics;  Laterality: Left;   TOTAL KNEE ARTHROPLASTY Right 05/10/2015   Procedure: TOTAL RIGHT KNEE ARTHROPLASTY;  Surgeon: Eugenia Mcalpine, MD;  Location: [facility];  Service: Orthopedics;  Laterality: Right;    Current Medications: Current Meds  Medication Sig   acetaminophen (TYLENOL) 500 MG tablet Take 500-1,000 mg by mouth every 6 (six) hours as needed for moderate pain or headache.    Apoaequorin (PREVAGEN PO) Take 1 tablet by mouth daily.   aspirin 81 MG tablet Take 81 mg by mouth every evening.    cetirizine (ZYRTEC) 10 MG tablet Take 10 mg by mouth every morning.   cholecalciferol (VITAMIN D) 400 UNITS TABS Take 400 Units by mouth 2 (two) times daily.   denosumab (PROLIA) 60 MG/ML SOLN injection Inject 60 mg into the skin every 6 (six) months. Administer in upper arm, thigh, or abdomen   docusate sodium (COLACE) 100 MG capsule Take 100 mg by mouth 2 (two) times daily.   ezetimibe (ZETIA) 10 MG tablet Take 1 tablet (10 mg total) by mouth every evening.   fluticasone (FLONASE) 50 MCG/ACT nasal spray Place 2 sprays into both nostrils daily.   fluticasone furoate-vilanterol (BREO ELLIPTA) 100-25 MCG/ACT AEPB Inhale 1 puff into the lungs daily.   furosemide (LASIX) 20 MG tablet TAKE 1 TABLET BY MOUTH  DAILY AS NEEDED FOR FLUID  OR EDEMA   Melatonin 5 MG CAPS Take 10 mg by mouth at bedtime.    meloxicam (MOBIC) 15 MG tablet Take 15 mg by mouth every evening.   Menthol, Topical Analgesic, (ICY HOT) 16 % LIQD Apply topically.   methocarbamol (ROBAXIN) 500 MG tablet Take 500 mg by mouth 4 (four) times daily as needed for muscle spasms (cramps).   metoprolol succinate (TOPROL-XL) 25 MG 24 hr tablet Take 0.5 tablets (12.5 mg total) by mouth daily.   mupirocin ointment (BACTROBAN) 2 % Apply 1 application topically 2 (two) times daily.   MYRBETRIQ 50 MG TB24 tablet Take 50 mg by mouth at bedtime.   nitroGLYCERIN (NITROSTAT) 0.4 MG SL tablet Place 1 tablet (0.4 mg total) under the tongue every 5 (five) minutes as needed. As needed for chest pain x 3 doses   olmesartan (BENICAR) 40 MG tablet TAKE 1 TABLET BY MOUTH  DAILY   omeprazole (PRILOSEC) 40 MG capsule Take 40 mg by mouth daily.   polyethylene glycol (MIRALAX / GLYCOLAX) 17 g packet Take 17 g by mouth daily as needed.   Probiotic Product (PROBIOTIC-10 PO) Take 1 tablet by mouth daily at 6 (six) AM.   rosuvastatin (CRESTOR) 20 MG tablet Take 1 tablet (20 mg total) by mouth daily.   sodium chloride (OCEAN) 0.65 % SOLN nasal spray Place 1 spray into both nostrils as needed for congestion.   triamcinolone cream (KENALOG) 0.1 % Apply 1 application. topically daily.     Allergies:   Codeine, Sulfa antibiotics, Sulfasalazine, Adhesive [tape], Atorvastatin, Other, and Penicillins   Social History   Socioeconomic History   Marital status: Married    Spouse name: Not on file   Number of children: Not on file   Years of education: Not on file   Highest education level: Not on file  Occupational History   Not on file  Tobacco Use   Smoking status: Never    Passive exposure: Past   Smokeless tobacco: Never  Vaping Use   Vaping status: Never Used  Substance and Sexual Activity   Alcohol use: Yes    Alcohol/week: 1.0 standard drink of alcohol    Types: 1 Glasses of wine per week    Comment: occassionally   Drug use: No   Sexual  activity: Not on file  Other Topics Concern   Not on file  Social History Narrative   Not on file   Social Determinants of Health   Financial Resource Strain: Not on file  Food Insecurity: Not on file  Transportation Needs: Not on file  Physical Activity: Not on file  Stress: Not on file  Social Connections: Not on file     Family History: The patient's family history includes Breast cancer in her daughter and mother; Cancer in her mother; Heart Problems in her father; Hypertension in her father and mother.  ROS:   Please see the history of present illness.    ROS  All other systems reviewed and negative.   EKGs/Labs/Other Studies Reviewed:    The following studies were reviewed today:hospital notes from recent ER visit.  Outside labs from PCP EKG Interpretation Date/Time:  Thursday June 17 2023 13:39:27 EDT Ventricular Rate:  57 PR Interval:  172 QRS Duration:  90 QT Interval:  418 QTC Calculation: 406 R Axis:   18  Text Interpretation: Sinus bradycardia with sinus arrhythmia When compared with ECG of 10-Aug-2020 00:53, PREVIOUS ECG IS PRESENT Confirmed by Armanda Magic ([id number]) on 06/17/2023 1:49:58 PM    Recent Labs: No results found for requested labs within last 365 days.   Recent Lipid Panel    Component Value Date/Time   CHOL 102 07/05/2019 0730   TRIG 103 07/05/2019 0730   HDL 48 07/05/2019 0730   CHOLHDL 2.1 07/05/2019 0730   CHOLHDL 2.1 04/24/2016 0850   VLDL 26 04/24/2016 0850   LDLCALC 35 07/05/2019 0730    Physical Exam:    VS:  BP 134/84 (BP Location: Left Arm, Patient Position: Sitting, Cuff Size: Large)   Pulse (!) 57   Ht 5\' 6"  (1.676 m)   Wt 208 lb 12.8 oz (94.7 kg)   SpO2 96%   BMI 33.70 kg/m     Wt Readings from Last 3 Encounters:  06/17/23 208 lb 12.8 oz (94.7 kg)  04/06/23 207 lb (93.9 kg)  10/08/22 207 lb 9.6 oz (94.2 kg)    GEN: Well  nourished, well developed in no acute distress HEENT: Normal NECK: No JVD; No carotid  bruits LYMPHATICS: No lymphadenopathy CARDIAC:RRR, no murmurs, rubs, gallops RESPIRATORY:  Clear to auscultation without rales, wheezing or rhonchi  ABDOMEN: Soft, non-tender, non-distended MUSCULOSKELETAL:  No edema; No deformity  SKIN: Warm and dry NEUROLOGIC:  Alert and oriented x 3 PSYCHIATRIC:  Normal affect  ASSESSMENT:    1. Coronary artery disease involving native coronary artery of native heart without angina pectoris   2. Hyperlipidemia LDL goal <70   3. Essential hypertension   4. Ascending aorta dilatation (HCC)   5. Chronic diastolic CHF (congestive heart failure) (HCC)     PLAN:    In order of problems listed above:  1.  ASCAD -S/p PCI of the LAD in 2006 -repeat cardiac cath in 2014 showing 30% mid LAD distal to the stent and otherwise normal coronary arteries and repeat heart cath 08/2016 showed normal coronary arteries with patent LAD stent and normal LVEDP -She denies any anginal symptoms -Continue prescription drug management with aspirin 81 mg daily, Crestor 20 mg daily and Toprol-XL 12.5 mg daily with as needed refills  2.  HLD -LDL goal is < 70 -I have personally reviewed and interpreted outside labs performed by patient's PCP which showed LDL 38 and HDL 54 12/23/2022 -Continue prescription management with Crestor 20 mg daily and Zetia 10 mg daily with as needed refills.  3.  HTN -BP controlled on exam today -Continue prescription drug management with Toprol-XL 12.5 mg daily and olmesartan 40 mg daily with as needed refills  4.  Ascending aortic dilatation -2D echo showed 3.5 x 4.2cm and 3.8 x 4.2cm on Chest CT 12/2019 -Chest CT 05/2022 with ascending aorta measuring 4 cm -Repeat chest CTA  5.  Chronic diastolic CHF -She appears euvolemic on exam today -She has not required diuretic therapy   Medication Adjustments/Labs and Tests Ordered: Current medicines are reviewed at length with the patient today.  Concerns regarding medicines are outlined above.   Orders Placed This Encounter  Procedures   EKG 12-Lead   No orders of the defined types were placed in this encounter.   Signed, Armanda Magic, MD  06/17/2023 1:48 PM    [facility]

## 2023-06-17 NOTE — Addendum Note (Signed)
Addended by: Luellen Pucker on: 06/17/2023 02:04 PM   Modules accepted: Orders

## 2023-06-17 NOTE — Patient Instructions (Signed)
Medication Instructions:  Your physician recommends that you continue on your current medications as directed. Please refer to the Current Medication list given to you today.  *If you need a refill on your cardiac medications before your next appointment, please call your pharmacy*   Lab Work: Please complete a CMET in our lab before you leave today.  If you have labs (blood work) drawn today and your tests are completely normal, you will receive your results only by: MyChart Message (if you have MyChart) OR A paper copy in the mail If you have any lab test that is abnormal or we need to change your treatment, we will call you to review the results.   Testing/Procedures:   Your cardiac CT will be scheduled at:   [facility] [address] [place], [place] [address] [phone]  Please arrive at the [facility] and Children's Entrance (Entrance C2) of [facility] 30 minutes prior to test start time. You can use the FREE valet parking offered at entrance C (encouraged to control the heart rate for the test)  Proceed to the [facility] Radiology Department (first floor) to check-in and test prep.  All radiology patients and guests should use entrance C2 at [facility], accessed from [facility], even though the hospital's physical address listed is [address].       Please follow these instructions carefully (unless otherwise directed):  An IV will be required for this test and Nitroglycerin will be given.  Hold all erectile dysfunction medications at least 3 days (72 hrs) prior to test. (Ie viagra, cialis, sildenafil, tadalafil, etc)   On the Night Before the Test: Be sure to Drink plenty of water. Do not consume any caffeinated/decaffeinated beverages or chocolate 12 hours prior to your test. Do not take any antihistamines 12 hours prior to your test.   We will call to schedule your test 2-4 weeks out understanding that  some insurance companies will need an authorization prior to the service being performed.   For more information and frequently asked questions, please visit our website : [email or web address]  For non-scheduling related questions, please contact the cardiac imaging nurse navigator should you have any questions/concerns: Cardiac Imaging Nurse Navigators Direct Office Dial: [phone]   For scheduling needs, including cancellations and rescheduling, please call [place], [phone].    Follow-Up:  Your next appointment:   1 year(s)  Provider:   Armanda Magic, MD

## 2023-06-18 LAB — COMPREHENSIVE METABOLIC PANEL
ALT: 14 IU/L (ref 0–32)
AST: 19 IU/L (ref 0–40)
Albumin: 4.7 g/dL (ref 3.7–4.7)
Alkaline Phosphatase: 57 IU/L (ref 44–121)
BUN/Creatinine Ratio: 26 (ref 12–28)
BUN: 23 mg/dL (ref 8–27)
Bilirubin Total: 0.6 mg/dL (ref 0.0–1.2)
CO2: 23 mmol/L (ref 20–29)
Calcium: 9.6 mg/dL (ref 8.7–10.3)
Chloride: 104 mmol/L (ref 96–106)
Creatinine, Ser: 0.9 mg/dL (ref 0.57–1.00)
Globulin, Total: 1.7 g/dL (ref 1.5–4.5)
Glucose: 97 mg/dL (ref 70–99)
Potassium: 4.8 mmol/L (ref 3.5–5.2)
Sodium: 140 mmol/L (ref 134–144)
Total Protein: 6.4 g/dL (ref 6.0–8.5)
eGFR: 63 mL/min/{1.73_m2} (ref >59)

## 2023-06-25 ENCOUNTER — Telehealth: Payer: Self-pay

## 2023-06-25 NOTE — Telephone Encounter (Signed)
-----   Message from Armanda Magic sent at 06/21/2023  9:40 AM EDT ----- Please let patient know that labs were normal.  Continue current medical therapy.

## 2023-06-25 NOTE — Telephone Encounter (Signed)
Called patient to advise that labs were normal and to continue current medical therapy. Patient verbalizes understanding.

## 2023-06-28 DIAGNOSIS — Z9989 Dependence on other enabling machines and devices: Secondary | ICD-10-CM | POA: Diagnosis not present

## 2023-06-28 DIAGNOSIS — M150 Primary generalized (osteo)arthritis: Secondary | ICD-10-CM | POA: Diagnosis not present

## 2023-06-28 DIAGNOSIS — I25119 Atherosclerotic heart disease of native coronary artery with unspecified angina pectoris: Secondary | ICD-10-CM | POA: Diagnosis not present

## 2023-06-28 DIAGNOSIS — I110 Hypertensive heart disease with heart failure: Secondary | ICD-10-CM | POA: Diagnosis not present

## 2023-06-28 DIAGNOSIS — E782 Mixed hyperlipidemia: Secondary | ICD-10-CM | POA: Diagnosis not present

## 2023-06-28 DIAGNOSIS — M8580 Other specified disorders of bone density and structure, unspecified site: Secondary | ICD-10-CM | POA: Diagnosis not present

## 2023-06-28 DIAGNOSIS — G4700 Insomnia, unspecified: Secondary | ICD-10-CM | POA: Diagnosis not present

## 2023-06-28 DIAGNOSIS — I5030 Unspecified diastolic (congestive) heart failure: Secondary | ICD-10-CM | POA: Diagnosis not present

## 2023-06-28 DIAGNOSIS — R7301 Impaired fasting glucose: Secondary | ICD-10-CM | POA: Diagnosis not present

## 2023-07-20 ENCOUNTER — Ambulatory Visit (HOSPITAL_COMMUNITY)
Admission: RE | Admit: 2023-07-20 | Discharge: 2023-07-20 | Disposition: A | Discharge status: Home / Self Care | Payer: Medicare Other | Source: Ambulatory Visit | Attending: Cardiology | Admitting: Cardiology

## 2023-07-20 DIAGNOSIS — I2510 Atherosclerotic heart disease of native coronary artery without angina pectoris: Secondary | ICD-10-CM | POA: Diagnosis not present

## 2023-07-20 DIAGNOSIS — Z01818 Encounter for other preprocedural examination: Secondary | ICD-10-CM | POA: Diagnosis not present

## 2023-07-20 DIAGNOSIS — I7121 Aneurysm of the ascending aorta, without rupture: Secondary | ICD-10-CM | POA: Diagnosis not present

## 2023-07-20 DIAGNOSIS — I728 Aneurysm of other specified arteries: Secondary | ICD-10-CM | POA: Diagnosis not present

## 2023-07-20 DIAGNOSIS — I77810 Thoracic aortic ectasia: Secondary | ICD-10-CM

## 2023-07-20 MED ORDER — IOHEXOL 350 MG/ML SOLN
75.0000 mL | Freq: Once | INTRAVENOUS | Status: AC | PRN
  Administered 2023-07-20: 75 mL via INTRAVENOUS

## 2023-07-23 ENCOUNTER — Encounter: Payer: Self-pay | Admitting: Cardiology

## 2023-07-27 ENCOUNTER — Telehealth: Payer: Self-pay

## 2023-07-27 NOTE — Telephone Encounter (Signed)
Call to patient and spouse to explain chest CT angio demonstrated stable dilated ascending aorta at 4 cm and stable 1.8 cm splenic artery aneurysm that was noted back in March 2021. Per Dr. Mayford Knife, there is plaque in the aorta. Patient and spouse verbalized understanding and findings sent to PCP for splenic artery aneurysm.

## 2023-07-27 NOTE — Telephone Encounter (Signed)
-----   Message from Nurse Corky Crafts sent at 07/27/2023  8:09 AM EDT -----  ----- Message ----- From: Quintella Reichert, MD Sent: 07/23/2023   3:04 PM EDT To: [facility] Triage  Chest CT angio demonstrated stable dilated ascending aorta at 4 cm.  There is a stable 1.8 cm splenic artery aneurysm that was noted back in March 2021 at stable.  There is plaque in the aorta.  Please forward findings to PCP for splenic artery aneurysm.

## 2023-07-31 DIAGNOSIS — Z23 Encounter for immunization: Secondary | ICD-10-CM | POA: Diagnosis not present

## 2023-08-19 DIAGNOSIS — Z961 Presence of intraocular lens: Secondary | ICD-10-CM | POA: Diagnosis not present

## 2023-08-19 DIAGNOSIS — H26492 Other secondary cataract, left eye: Secondary | ICD-10-CM | POA: Diagnosis not present

## 2023-09-08 ENCOUNTER — Ambulatory Visit: Payer: Medicare Other | Admitting: Podiatry

## 2023-09-08 ENCOUNTER — Encounter: Payer: Self-pay | Admitting: Podiatry

## 2023-09-08 DIAGNOSIS — M79674 Pain in right toe(s): Secondary | ICD-10-CM

## 2023-09-08 DIAGNOSIS — B351 Tinea unguium: Secondary | ICD-10-CM

## 2023-09-08 NOTE — Progress Notes (Signed)
This patient presents  to the office for evaluation and treatment of long thick painful nails .  This patient is unable to trim her own nails since the patient cannot reach her feet.  Patient says the nails are painful walking and wearing his shoes.  He returns for preventive foot care services.  General Appearance  Alert, conversant and in no acute stress.  Vascular  Dorsalis pedis and posterior tibial  pulses are palpable  bilaterally.  Capillary return is within normal limits  bilaterally. Temperature is within normal limits  bilaterally.  Neurologic  Senn-Weinstein monofilament wire test within normal limits  bilaterally. Muscle power within normal limits bilaterally.  Nails Thick disfigured discolored nails with subungual debris  3,4 and 5 toenails  right. No evidence of bacterial infection or drainage bilaterally.  Orthopedic  No limitations of motion  feet .  No crepitus or effusions noted.  No bony pathology or digital deformities noted. HAV  B/L.  Hammer toes 2-5  B/L.  Skin  normotropic skin with no porokeratosis noted bilaterally.  No signs of infections or ulcers noted.     Onychomycosis  Pain in toes right foot    Debridement  of nails  1-5  B/L with a nail nipper.  Nails were then filed using a dremel tool with no incidents.    RTC  12 weeks    Gardiner Barefoot DPM

## 2023-09-09 ENCOUNTER — Ambulatory Visit: Payer: Medicare Other | Admitting: Internal Medicine

## 2023-09-09 ENCOUNTER — Encounter: Payer: Self-pay | Admitting: Internal Medicine

## 2023-09-09 VITALS — BP 115/78 | HR 53 | Ht 64.0 in | Wt 206.4 lb

## 2023-09-09 DIAGNOSIS — J45991 Cough variant asthma: Secondary | ICD-10-CM | POA: Diagnosis not present

## 2023-09-09 NOTE — Progress Notes (Signed)
Patient seen in the office today and instructed on use of Breo.  Patient expressed understanding and demonstrated technique.  

## 2023-09-09 NOTE — Progress Notes (Signed)
Subjective:     Patient ID: Brittany Robertson, female   DOB: 10-03-1941, 82 y.o.   MRN: [id number]  HPI  PCP Brittany Raider, MD   HPI   IOV 06/29/2016  Chief Complaint  Patient presents with   Pulmonary Consult    Pt referred by Dr. Armanda Robertson for abnormal PFT. Pt c/o DOE with little activity, dry cough and left upper chest pain when SOB. Pt saw cardiology in 04/2016 and did stress test and echo.      Brittany Robertson is a 82 y.o. female who is a resident of [place]. She presents with a husband because of insidious onset of shortness of breath for last 9 months. She tells me that in 2005 she had similar shortness of breath and at that time had coronary stent placed and shortness of breath resolved. Then this time dyspnea has recurred some 6-9 months ago. Since then it is been progressive. It is a moderate intensity. It is associated with a mild dry cough. This also progressive. She went to cardiologist Dr. Mayford Robertson who did cardiac stress test 05/14/2016 and this was low risk. Patient then had pulmonary function test which showed isolated reduction in diffusion capacity of 14.85/61%. Spirometry and total lung capacity normal. I personally visualized the trace. Chest x-ray done 05/25/2016 that I personally visualized shows possibility of interstitial lung disease. She does have a CT chest in 2012 done during the time of trauma that shows some infiltrates. She and her husband now wants this evaluated. Most recent hemoglobin 04/24/2016 is normal at 13.1 g percent. Creatinine is normal at 0.76 mg percent.    OV 08/10/2016  Chief Complaint  Patient presents with   Follow-up    Pt here after HRCT and CPST. Pt states she is still SOB, no change. Pt c/o occassional dry cough and upper left chest pain. Pt denies f/c/s and swelling.    Follow-up dyspnea  Here with her husband review high-resolution CT chest documented below and pulmonary stress test done 07/29/2016. Pulmonary stress  test results reviewed. She was wheezing at the outset. She had dyspnea then exercise. She given adequate exercise. Etiology of dyspnea is likely obesity and possible diastolic dysfunction but mostly obesity. This is based on Ve. there was no evidence of exercise-induced spasm. Today we did exhaled nitric oxide and this was normal at 10. In discussing the results husband reported that several years ago she had upper endoscopy for a sensation of frog in the throat and was referred to Dr. Jearld Robertson and ENT and something is found in the vocal . He also admits to significant snoring of the wife but no nighttime apnea spells. There is a definite fatigue but no excessive daytime somnolence. They're wondering about possible sleep apnea.      IMPRESSION: 1. No evidence of interstitial lung disease. 2. Right middle lobe 4 mm solid pulmonary nodule, new since 09/27/2011. No follow-up needed if patient is low-risk. Non-contrast chest CT can be considered in 12 months if patient is high-risk. This recommendation follows the consensus statement: Guidelines for Management of Incidental Pulmonary Nodules Detected on CT Images: From the Fleischner Society 2017; Radiology 2017; 284:228-243. 3. 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 4. Aortic atherosclerosis. 5. **An incidental finding of potential clinical significance has been found. Ascending thoracic aortic 4.2 cm aneurysm, new since 2012. Recommend annual imaging followup by CTA or MRA. This recommendation follows 2010 ACCF/AHA/AATS/ACR/ASA/SCA/SCAI/SIR/STS/SVM Guidelines for the Diagnosis and Management of Patients with  Thoracic Aortic Disease. Circulation. 2010; 121: Z610-R604. ** 6. Left main and 3 vessel coronary atherosclerosis.   Electronically Signed   By: Brittany Robertson M.D.   On: 07/03/2016 10:38      OV 02/15/17  Chief Complaint  Patient presents with   Follow-up    Pt states she is doing well since starting pulmonary rehab.  Pt denies cough, CP/tightness, cough.    Follow-up dyspnea due to obesity and diastolic dysfunction: She attended pulmonary habitation and this helped significantly. Now doing yard work. She and her husband are thankful. A few months ago she did have the flu was given Z-Pak. She did have some pleuritic chest pain at that time. It is now resolved. She feels better than ever before.  Follow-up right middle lobe lung nodule.: She had questions about this. She is due for annual CT scan later this year in September 2018. Explained low risk for lung cancer.     OV 04/22/2022 -greater than 3 years since her prior visit so this new office visit  Subjective:  Patient ID: Brittany Robertson, female , DOB: [birth year] , age 82 y.o. , MRN: [id number] , ADDRESS: [address] [address] [place] [address] PCP Brittany Raider, MD Patient Care Team: Brittany Raider, MD as PCP - General (Family Medicine) Brittany Reichert, MD as PCP - Cardiology (Cardiology) Brittany Ade, PA-C as Physician Assistant (Dermatology)  This Provider for this visit: Treatment Team:  Attending Provider: Kalman Shan, MD    04/22/2022 -   Chief Complaint  Patient presents with   Consult    Pt states she has a hacking cough and clearing her throat for years. Denies SOB.     HPI Brittany Robertson 82 y.o. -new consult for chronic cough.  Presents with husband who is getting most of the history.  Since I last saw her several years ago husband states that she has some short-term memory issues.  She also is insomnia along with 5-6 episodes of nocturia at night.  Decreased ambulation because of DJD and uses a cane.  Previously 5 years ago she did pulmonary rehab but now she cannot do it for her limited activity she does not have shortness of breath.  With the main issues that she has chronic cough.  Present for at least 1 year.  Husband believes it is getting worse with the patient states it is stable.  Within the husband says short-term  memory issues make it difficult for her to recollect.  Described as moderate.  They deny any dysphagia although the question they noticed that there is some dysphagia for pills.  There is no wheezing.  Shortness of breath is undetermined.  However there is a lot of postnasal drainage she is using a lot of Kleenex.  She is on antihypertensive Benicar.  It is mostly dry.  Daytime worse the night but when she gets up to go to the bathroom at night she does cough.  There is no known acid reflux issues.  There is no known wheezing.  Last pulmonary function test 2017 April 2022 CT angiogram chest does not describe her previous nodule.  Specifically noted that no pulmonary issues but her cough is started since then.    CT Chest data April 2022 from CT angio  IMPRESSION: 1. Stable ascending thoracic aortic aneurysm measuring a maximum of 3.9 mm. No dissection. 2. Stable coronary artery calcifications. 3. No acute pulmonary findings or worrisome pulmonary lesions. 4. Aortic atherosclerosis.   Aortic Atherosclerosis (ICD10-I70.0).  Electronically Signed   By: Rudie Meyer M.D.   On: 02/14/2021 14:40   04/22/2022: OV with Dr. Marchelle Gearing.  She had previously been seen and followed for pulmonary nodule.  Had not been seen in about 5 years and was referred back for chronic cough, having some issues with short-term memory recently.  Has been helped provide history.  Cough been present for at least a year.  Husband follows though it was progressively getting worse.  Patient felt it was relatively unchanged.  Does not have any issues with dysphagia other than pills.  No significant shortness of breath.  Reported having a lot of issues with postnasal drainage and using a lot of Kleenex.  No known acid reflux issues.  No known wheezing.  Also having some issues with insomnia and nocturia.  Discussed that cough could be from sinus drainage, acid reflux, asthma or none of these; could also be a combination and  cause of cyclical cough/LPR cough.  CBC with differential -eosinophils 300, RAST allergy panel if.  HRCT supine and prone without evidence of ILD; there were some mild air trapping.  CT sinus without contrast with clear sinuses.  Full PFT.  06/01/2022: OV with Cobb NP for follow-up after undergoing pulmonary function testing.  Today, she reports that her cough is relatively unchanged.  It is primarily worse during the day and is dry, sometimes hacking.  She does not have any trouble with her breathing for the most part, sometimes will get winded with longer distances.  No wheezing or chest tightness.  She does still have some significant postnasal drip for which she uses saline nasal sprays for.  No significant GERD symptoms.  PFTs showed very mild obstruction with reversibility, consistent with asthma.  She also has air trapping on recent imaging and elevated eosinophils. FeNO nl today.  Suspect cough is multifactorial.  Regardless, we will trial her on ICS/LABA combo.  If she has good response to this and is stable for some time, can consider changing to as needed usage.  Asthma action plan discussed. CT sinus was clear.  Allergen panel was negative.  She does have some elevated eosinophils on CBC and has a history of allergy type symptoms.  We will start her on intranasal steroid for better postnasal drainage control.  07/13/2022: Today - follow up Patient presents today for follow up with her husband. She reports that her cough has been better since she was here last. She was trying to do 2 puffs Twice daily initially but this caused some throat irritation so she's doing puff twice a day. Her cough is almost gone; she still occasionally has a throat tickle with a dry cough but this is minimal. Breathing stable. No significant wheezing or chest congestion. Her nasal congestion and post nasal drip are better with flonase. No concerns or complaints today.   TEST/EVENTS:  05/19/2022 HRCT chest: Atherosclerosis.   No LAD.  There is no evidence of ILD.  There is air trapping present indicative of small airways disease.  There is a splenic artery aneurysm which is similar when compared to prior. 05/19/2022 CT sinus: Clear sinuses and nasal cavity 06/01/2022 PFTs: FVC 98, FEV1 106, ratio 80, TLC 102, DLCOcor 89. Positive BD (12%).    OV 10/08/2022  Subjective:  Patient ID: Brittany Robertson, female , DOB: May 21, 1941 , age 82 y.o. , MRN: [id number] , ADDRESS: [address] [address] [place] [address] PCP Brittany Raider, MD Patient Care Team: Brittany Raider, MD as PCP - General (  Family Medicine) Brittany Reichert, MD as PCP - Cardiology (Cardiology) Brittany Ade, PA-C as Physician Assistant (Dermatology)  This Provider for this visit: Treatment Team:  Attending Provider: Kalman Shan, MD    10/08/2022 -   Chief Complaint  Patient presents with   Follow-up    3 mos f/up  Chronic cough: Likely cough variant asthma with mild component of cough neuropathy.   HPI Brittany Robertson 82 y.o. -presents with her husband.  Husband is also my patient.  He says he is doing well.  Patient is also doing well.  Husband is giving most of the history.  The but they both acknowledge that the cough is actually better.  It is a level 1 out of 5.  She is taking her Symbicort only 1 puff once daily.  She does have some mild residual cough it does not wake her up.  She does clear her throat.  But definitely the Symbicort has helped her.  This was documented by the nurse practitioner as well.  There are no new issues.  She does take her Flonase.  We talked about adding gabapentin but took a shared decision making to the risk was not worth the benefit especially given just mild residual cough.           OV 04/06/2023  Subjective:  Patient ID: Brittany Robertson, female , DOB: 1941/06/07 , age 49 y.o. , MRN: [id number] , ADDRESS: [address] [address] [place] [address] PCP Brittany Raider, MD Patient Care Team: Brittany Raider,  MD as PCP - General (Family Medicine) Brittany Reichert, MD as PCP - Cardiology (Cardiology) Brittany Ade, PA-C as Physician Assistant (Dermatology)  This Provider for this visit: Treatment Team:  Attending Provider: Kalman Shan, MD    04/06/2023 -   Chief Complaint  Patient presents with   Follow-up    Cough is some better. She now only has occ dry cough. No new co's.      HPI Brittany Robertson 82 y.o. -follow-up chronic cough.  Presents with her husband.  Husband is the independent historian.  In fact he is the only historian.  He says wife is having short-term memory loss and is increasingly dependent on him for care.  He is coordinating the Symbicort usage.  He does admit it is a little bit technically challenging for himself and for her.  There open to the idea of taking something more simplistic such as of Breo.  They asked [place] to price this out.  Otherwise says she is doing well.  He wants to stay up-to-date with her health.  He wants to bring her here every 6 months.  Lab review - Blood work August 2023 creatinine normal 0.88 mg percent 0 hemoglobin A1c 6.0 March 2024 -Normal blood allergy panel July 2023 -Normal hemoglobin July 2023.  OV 09/09/2023  Subjective:  Patient ID: Brittany Robertson, female , DOB: 1941/04/11 , age 3 y.o. , MRN: [id number] , ADDRESS: [address] [address] [place] [address] PCP Brittany Raider, MD Patient Care Team: Brittany Raider, MD as PCP - General (Family Medicine) Brittany Reichert, MD as PCP - Cardiology (Cardiology) Brittany Ade, PA-C as Physician Assistant (Dermatology)  This Provider for this visit: Treatment Team:  Attending Provider: Kalman Shan, MD    09/09/2023 -   Chief Complaint  Patient presents with   Follow-up    Pt husband states she has been good. Still on symbicort has not started breo  HPI Brittany Robertson 82 y.o. -presents for chronic cough and cough variant asthma.  Husband is the main historian.   She did indicate this time that she is somewhat better.  She says she is doing well.  She did recollect that she is getting ready for Christmas and she is got a Christmas tree up.  She knows that Thanksgiving is coming up in 4 weeks I had of Christmas.  Husband states that she is diligent and compliant with her Symbicort but he is the supervisor.  He make sure she is taking all her medicines correctly.  She said to go to [facility] because they have to run out of the Symbicort first.  He got some clarification about when to take the [facility].  I told him he could take it anytime but thought maybe it is better if she takes it at night before bedtime.  Otherwise [facility] status: No new complaints No new medical problems. No new surgeries. No ER visits. No Urgent care visits. No changes to medications      Dr Gretta Cool Reflu - x Symptom Index (> 13-15 suggestive of LPR cough)  04/22/2022   Hoarseness of problem with voice 0  Clearing  Of Throat 2  Excess throat mucus or feeling of post nasal drip 2  Difficulty swallowing food, liquid or tablets 0  Cough after eating or lying down 2 pills  Breathing difficulties or choking episodes 0  Troublesome or annoying cough 2  Sensation of something sticking in throat or lump in throat 0  Heartburn, chest pain, indigestion, or stomach acid coming up 1  TOTAL 9    PFT     Latest Ref Rng & Units 06/01/2022    9:34 AM 05/25/2016    9:31 AM  ILD indicators  FVC-Pre L 2.43  2.59   FVC-Predicted Pre % 98  94   FVC-Post L 2.64  2.68   FVC-Predicted Post % 106  98   TLC L 5.18  4.71   TLC Predicted % 102  93   DLCO uncorrected ml/min/mmHg 16.20  14.85   DLCO UNC %Pred % 87  61   DLCO Corrected ml/min/mmHg 16.62    DLCO COR %Pred % 89        LAB RESULTS last 96 hours No results found.  LAB RESULTS last 90 days Recent Results (from the past 2160 hour(s))  Comprehensive metabolic panel     Status: None   Collection Time: 06/17/23  2:18 PM  Result Value  Ref Range   Glucose 97 70 - 99 mg/dL   BUN 23 8 - 27 mg/dL   Creatinine, Ser 1.61 0.57 - 1.00 mg/dL   eGFR 63 >09 UE/AVW/0.98   BUN/Creatinine Ratio 26 12 - 28   Sodium 140 134 - 144 mmol/L   Potassium 4.8 3.5 - 5.2 mmol/L   Chloride 104 96 - 106 mmol/L   CO2 23 20 - 29 mmol/L   Calcium 9.6 8.7 - 10.3 mg/dL   Total Protein 6.4 6.0 - 8.5 g/dL   Albumin 4.7 3.7 - 4.7 g/dL   Globulin, Total 1.7 1.5 - 4.5 g/dL   Bilirubin Total 0.6 0.0 - 1.2 mg/dL   Alkaline Phosphatase 57 44 - 121 IU/L   AST 19 0 - 40 IU/L   ALT 14 0 - 32 IU/L         has a past medical history of Ascending aortic aneurysm (HCC), Bruises easily, Chronic diastolic CHF (congestive heart failure) (HCC),  Complication of anesthesia, Coronary artery disease, DDD (degenerative disc disease), cervical, Dilated aortic root (HCC), Dysrhythmia, GERD (gastroesophageal reflux disease), History of kidney stones, Hyperlipidemia, Hypertension, Left ureteral stone, OA (osteoarthritis), Obesity, Osteopenia, PONV (postoperative nausea and vomiting), PVC's (premature ventricular contractions), and SOB (shortness of breath) (07/13/2016).   reports that she has never smoked. She has been exposed to tobacco smoke. She has never used smokeless tobacco.  Past Surgical History:  Procedure Laterality Date   ABDOMINAL HYSTERECTOMY     APPENDECTOMY  1957   arthroscopic knee  05/2002,04/07/2005   right and then also torn muscle surgery on right knee (2003(   arthroscopic knee  01/02/2005,09/21/2006   left   CARDIAC CATHETERIZATION N/A 08/31/2016   Procedure: Right/Left Heart Cath and Coronary Angiography;  Surgeon: Tonny Bollman, MD;  Location: [facility] INVASIVE CV LAB;  Service: Cardiovascular;  Laterality: N/A;   CHOLECYSTECTOMY  1988   COLONOSCOPY  2010   with Endo   CORONARY ANGIOPLASTY  02/05/2004   CYST REMOVAL HAND  2000   CYSTOSCOPY/URETEROSCOPY/HOLMIUM LASER/STENT PLACEMENT Left 08/26/2018   Procedure:  CYSTOSCOPY/RETROGRADE/URETEROSCOPY/HOLMIUM LASER/ STONE BASKETRY/ STENT PLACEMENT;  Surgeon: Rene Paci, MD;  Location: [facility];  Service: Urology;  Laterality: Left;  ONLY NEEDS  45 MIN   CYSTOSCOPY/URETEROSCOPY/HOLMIUM LASER/STENT PLACEMENT Bilateral 09/17/2020   Procedure: CYSTOSCOPY/URETEROSCOPY/HOLMIUM LASER/STENT PLACEMENT;  Surgeon: Rene Paci, MD;  Location: [facility];  Service: Urology;  Laterality: Bilateral;   DILATION AND CURETTAGE OF UTERUS  1972   EXTRACORPOREAL SHOCK WAVE LITHOTRIPSY Right 12/12/2018   Procedure: EXTRACORPOREAL SHOCK WAVE LITHOTRIPSY (ESWL);  Surgeon: Rene Paci, MD;  Location: [facility];  Service: Urology;  Laterality: Right;   EYE SURGERY  01/2007   bilateral cataract with lens implant   FOOT SURGERY  01/2012   left and right bunion and hammer toe   HAND SURGERY  05/29/2011   joint removed   HEMORRHOID SURGERY  1969   HERNIA REPAIR  02/2010   incisional hernioa   KNEE ARTHROSCOPY     LEFT HEART CATHETERIZATION WITH CORONARY ANGIOGRAM N/A 09/19/2013   Procedure: LEFT HEART CATHETERIZATION WITH CORONARY ANGIOGRAM;  Surgeon: Brittany Reichert, MD;  Location: [facility];  Service: Cardiovascular;  Laterality: N/A;   neck injections  03/2014   back and neck injections from Dr. Ethelene Hal   stents     x1   tear duct surgery  1990's   bilateral   TONSILLECTOMY     as child   TOTAL KNEE ARTHROPLASTY Left 01/04/2015   Procedure: LEFT TOTAL KNEE ARTHROPLASTY;  Surgeon: Eugenia Mcalpine, MD;  Location: [facility];  Service: Orthopedics;  Laterality: Left;   TOTAL KNEE ARTHROPLASTY Right 05/10/2015   Procedure: TOTAL RIGHT KNEE ARTHROPLASTY;  Surgeon: Eugenia Mcalpine, MD;  Location: [facility];  Service: Orthopedics;  Laterality: Right;    Allergies  Allergen Reactions   Codeine Nausea And Vomiting   Sulfa Antibiotics Hives and Itching   Sulfasalazine Itching and Hives   Adhesive [Tape] Itching, Rash and Other (See  Comments)    Skin irritation   Atorvastatin Rash and Other (See Comments)    GENERIC causes severe skin rash but can take Brand Name   Other Rash    Surgical tape   Penicillins Hives, Itching and Rash    Has patient had a PCN reaction causing immediate rash, facial/tongue/throat swelling, SOB or lightheadedness with hypotension: Yes Has patient had a PCN reaction causing severe rash involving mucus membranes or skin necrosis: Yes Has patient had  a PCN reaction that required hospitalization No Has patient had a PCN reaction occurring within the last 10 years: No If all of the above answers are "NO", then may proceed with Cephalosporin use.     Immunization History  Administered Date(s) Administered   Fluad Quad(high Dose 65+) 07/13/2022   Influenza, High Dose Seasonal PF 06/29/2016   Influenza,inj,Quad PF,6+ Mos 05/20/2015   Influenza-Unspecified 07/31/2023   Pneumococcal Conjugate-13 10/19/2013   Unspecified SARS-COV-2 Vaccination 02/14/2021    Family History  Problem Relation Age of Onset   Breast cancer Mother    Hypertension Mother    Cancer Mother    Hypertension Father    Heart Problems Father    Breast cancer Daughter        16s     Current Outpatient Medications:    acetaminophen (TYLENOL) 500 MG tablet, Take 500-1,000 mg by mouth every 6 (six) hours as needed for moderate pain or headache. , Disp: , Rfl:    Apoaequorin (PREVAGEN PO), Take 1 tablet by mouth daily., Disp: , Rfl:    aspirin 81 MG tablet, Take 81 mg by mouth every evening. , Disp: , Rfl:    budesonide-formoterol (SYMBICORT) 80-4.5 MCG/ACT inhaler, Inhale 2 puffs into the lungs in the morning and at bedtime., Disp: 1 each, Rfl: 5   cetirizine (ZYRTEC) 10 MG tablet, Take 10 mg by mouth every morning., Disp: , Rfl:    cholecalciferol (VITAMIN D) 400 UNITS TABS, Take 400 Units by mouth 2 (two) times daily., Disp: , Rfl:    denosumab (PROLIA) 60 MG/ML SOLN injection, Inject 60 mg into the skin every 6 (six)  months. Administer in upper arm, thigh, or abdomen, Disp: , Rfl:    docusate sodium (COLACE) 100 MG capsule, Take 100 mg by mouth 2 (two) times daily., Disp: , Rfl:    ezetimibe (ZETIA) 10 MG tablet, Take 1 tablet (10 mg total) by mouth every evening., Disp: 90 tablet, Rfl: 1   famotidine (PEPCID) 20 MG tablet, Take one tablet by mouth daily as needed, Disp: 90 tablet, Rfl: 3   fluocinonide gel (LIDEX) 0.05 %, Apply topically., Disp: , Rfl:    fluticasone (FLONASE) 50 MCG/ACT nasal spray, Place 2 sprays into both nostrils daily., Disp: 18.2 mL, Rfl: 2   fluticasone furoate-vilanterol (BREO ELLIPTA) 100-25 MCG/ACT AEPB, Inhale 1 puff into the lungs daily., Disp: 28 each, Rfl: 5   FLUZONE HIGH-DOSE QUADRIVALENT 0.7 ML SUSY, , Disp: , Rfl:    furosemide (LASIX) 20 MG tablet, TAKE 1 TABLET BY MOUTH  DAILY AS NEEDED FOR FLUID  OR EDEMA, Disp: 90 tablet, Rfl: 3   Melatonin 5 MG CAPS, Take 10 mg by mouth at bedtime., Disp: , Rfl:    meloxicam (MOBIC) 15 MG tablet, Take 15 mg by mouth every evening., Disp: , Rfl:    Menthol, Topical Analgesic, (ICY HOT) 16 % LIQD, Apply topically., Disp: , Rfl:    methocarbamol (ROBAXIN) 500 MG tablet, Take 500 mg by mouth 4 (four) times daily as needed for muscle spasms (cramps)., Disp: , Rfl:    metoprolol succinate (TOPROL-XL) 25 MG 24 hr tablet, Take 0.5 tablets (12.5 mg total) by mouth daily., Disp: 45 tablet, Rfl: 1   mupirocin ointment (BACTROBAN) 2 %, Apply 1 application topically 2 (two) times daily., Disp: 22 g, Rfl: 6   MYRBETRIQ 50 MG TB24 tablet, Take 50 mg by mouth at bedtime., Disp: , Rfl:    nitroGLYCERIN (NITROSTAT) 0.4 MG SL tablet, Place 1 tablet (0.4  mg total) under the tongue every 5 (five) minutes as needed. As needed for chest pain x 3 doses, Disp: 25 tablet, Rfl: 5   olmesartan (BENICAR) 40 MG tablet, TAKE 1 TABLET BY MOUTH  DAILY, Disp: 90 tablet, Rfl: 3   omeprazole (PRILOSEC) 40 MG capsule, Take 40 mg by mouth daily., Disp: , Rfl:     polyethylene glycol (MIRALAX / GLYCOLAX) 17 g packet, Take 17 g by mouth daily as needed., Disp: , Rfl:    Probiotic Product (PROBIOTIC-10 PO), Take 1 tablet by mouth daily at 6 (six) AM., Disp: , Rfl:    rosuvastatin (CRESTOR) 20 MG tablet, Take 1 tablet (20 mg total) by mouth daily., Disp: 90 tablet, Rfl: 1   sodium chloride (OCEAN) 0.65 % SOLN nasal spray, Place 1 spray into both nostrils as needed for congestion., Disp: , Rfl:    triamcinolone cream (KENALOG) 0.1 %, Apply 1 application. topically daily., Disp: 80 g, Rfl: 11   Vitamin D, Cholecalciferol, 10 MCG (400 UNIT) CAPS, Take 400 Int'l Units/1.72m2 by mouth every Monday, Wednesday, and Friday., Disp: , Rfl:    betamethasone dipropionate (DIPROLENE) 0.05 % ointment, Apply topically 2 (two) times daily as needed (Rash). (Patient not taking: Reported on 06/17/2023), Disp: 45 g, Rfl: 3   metoprolol tartrate (LOPRESSOR) 25 MG tablet, Take 1 tablet (25 mg total) by mouth once for 1 dose. Take 90-120 minutes prior to scan. Hold for SBP less than 110., Disp: 1 tablet, Rfl: 0      Objective:   Vitals:   09/09/23 1558  BP: 115/78  Pulse: (!) 53  SpO2: 97%  Weight: 206 lb 6.4 oz (93.6 kg)  Height: 5\' 4"  (1.626 m)    Estimated body mass index is 35.43 kg/m as calculated from the following:   Height as of this encounter: 5\' 4"  (1.626 m).   Weight as of this encounter: 206 lb 6.4 oz (93.6 kg).  @WEIGHTCHANGE @  [employer]   09/09/23 1558  Weight: 206 lb 6.4 oz (93.6 kg)     Physical Exam   General: No distress. Look well O2 at rest: no Cane present: YES Sitting in wheel chair: no Frail: no Obese: YES Neuro: Alert and Oriented x 3. GCS 15. Speech normal Psych: Pleasant Resp:  Barrel Chest - no.  Wheeze - no, Crackles - no, No overt respiratory distress CVS: Normal heart sounds. Murmurs - no Ext: Stigmata of Connective Tissue Disease - no HEENT: Normal upper airway. PEERL +. No post nasal drip        Assessment:        ICD-10-CM   1. Cough variant asthma  J45.991          Plan:     Patient Instructions  Chronic cough Cough variant asthma Chronic throat clearing  Cough much improved  to non-existent and well controlled with symbicort  REsidual cough is mild and due to cough neuropathy Symbicort Is posing some physical inconvenience esp as memory loss has started  Plan  -- start BREO low dose 1 puff once daily  - can do this as soon as symbicort runs out  - CMA to ensure refills  - continue albuterol as needed    #Followup - DR Marchelle Gearing In  7-9 months   FOLLOWUP Return in about 7 months (around 04/08/2024) for 15 min visit, with Dr Marchelle Gearing, Face to Face OR Video Visit.    SIGNATURE    Dr. Kalman Robertson, M.D., F.C.C.P,  Pulmonary and Critical Care  Medicine Staff Physician, [facility] Director - Interstitial Lung Disease  Program  Pulmonary Fibrosis [facility] at [facility] [place], [place], [address]  Pager: [phone], If no answer or between  15:00h - 7:00h: call [phone] Telephone: [phone]  4:32 PM 09/09/2023

## 2023-09-09 NOTE — Patient Instructions (Addendum)
Chronic cough Cough variant asthma Chronic throat clearing  Cough much improved  to non-existent and well controlled with symbicort  REsidual cough is mild and due to cough neuropathy Symbicort Is posing some physical inconvenience esp as memory loss has started  Plan  -- start BREO low dose 1 puff once daily  - can do this as soon as symbicort runs out  - CMA to ensure refills  - continue albuterol as needed    #Followup - DR Marchelle Gearing In  7-9 months

## 2023-10-01 DIAGNOSIS — N3020 Other chronic cystitis without hematuria: Secondary | ICD-10-CM | POA: Diagnosis not present

## 2023-10-01 DIAGNOSIS — N200 Calculus of kidney: Secondary | ICD-10-CM | POA: Diagnosis not present

## 2023-10-01 DIAGNOSIS — R8271 Bacteriuria: Secondary | ICD-10-CM | POA: Diagnosis not present

## 2023-10-25 ENCOUNTER — Other Ambulatory Visit: Payer: Self-pay | Admitting: Cardiology

## 2023-10-29 DIAGNOSIS — Z961 Presence of intraocular lens: Secondary | ICD-10-CM | POA: Diagnosis not present

## 2023-10-29 DIAGNOSIS — H26492 Other secondary cataract, left eye: Secondary | ICD-10-CM | POA: Diagnosis not present

## 2023-11-02 DIAGNOSIS — L821 Other seborrheic keratosis: Secondary | ICD-10-CM | POA: Diagnosis not present

## 2023-11-02 DIAGNOSIS — L814 Other melanin hyperpigmentation: Secondary | ICD-10-CM | POA: Diagnosis not present

## 2023-11-02 DIAGNOSIS — D2271 Melanocytic nevi of right lower limb, including hip: Secondary | ICD-10-CM | POA: Diagnosis not present

## 2023-11-02 DIAGNOSIS — L820 Inflamed seborrheic keratosis: Secondary | ICD-10-CM | POA: Diagnosis not present

## 2023-11-02 DIAGNOSIS — D1801 Hemangioma of skin and subcutaneous tissue: Secondary | ICD-10-CM | POA: Diagnosis not present

## 2023-12-16 ENCOUNTER — Ambulatory Visit: Payer: Medicare Other | Admitting: Podiatry

## 2023-12-16 ENCOUNTER — Encounter: Payer: Self-pay | Admitting: Podiatry

## 2023-12-16 DIAGNOSIS — M79674 Pain in right toe(s): Secondary | ICD-10-CM | POA: Diagnosis not present

## 2023-12-16 DIAGNOSIS — B351 Tinea unguium: Secondary | ICD-10-CM | POA: Diagnosis not present

## 2023-12-16 NOTE — Progress Notes (Signed)
 This patient presents  to the office for evaluation and treatment of long thick painful nails .  This patient is unable to trim her own nails since the patient cannot reach her feet.  Patient says the nails are painful walking and wearing his shoes.  He returns for preventive foot care services.  General Appearance  Alert, conversant and in no acute stress.  Vascular  Dorsalis pedis and posterior tibial  pulses are palpable  bilaterally.  Capillary return is within normal limits  bilaterally. Temperature is within normal limits  bilaterally.  Neurologic  Senn-Weinstein monofilament wire test within normal limits  bilaterally. Muscle power within normal limits bilaterally.  Nails Thick disfigured discolored nails with subungual debris  3,4 and 5 toenails  right. No evidence of bacterial infection or drainage bilaterally.  Orthopedic  No limitations of motion  feet .  No crepitus or effusions noted.  No bony pathology or digital deformities noted. HAV  B/L.  Hammer toes 2-5  B/L.  Skin  normotropic skin with no porokeratosis noted bilaterally.  No signs of infections or ulcers noted.     Onychomycosis  Pain in toes right foot    Debridement  of nails  1-5  B/L with a nail nipper.  Nails were then filed using a dremel tool with no incidents.    RTC  10 weeks    Gardiner Barefoot DPM

## 2023-12-19 ENCOUNTER — Other Ambulatory Visit: Payer: Self-pay | Admitting: Cardiology

## 2023-12-27 DIAGNOSIS — J42 Unspecified chronic bronchitis: Secondary | ICD-10-CM | POA: Diagnosis not present

## 2023-12-27 DIAGNOSIS — M150 Primary generalized (osteo)arthritis: Secondary | ICD-10-CM | POA: Diagnosis not present

## 2023-12-27 DIAGNOSIS — R911 Solitary pulmonary nodule: Secondary | ICD-10-CM | POA: Diagnosis not present

## 2023-12-27 DIAGNOSIS — R7301 Impaired fasting glucose: Secondary | ICD-10-CM | POA: Diagnosis not present

## 2023-12-27 DIAGNOSIS — I7121 Aneurysm of the ascending aorta, without rupture: Secondary | ICD-10-CM | POA: Diagnosis not present

## 2023-12-27 DIAGNOSIS — I25119 Atherosclerotic heart disease of native coronary artery with unspecified angina pectoris: Secondary | ICD-10-CM | POA: Diagnosis not present

## 2023-12-27 DIAGNOSIS — Z0000 Encounter for general adult medical examination without abnormal findings: Secondary | ICD-10-CM | POA: Diagnosis not present

## 2023-12-27 DIAGNOSIS — M8580 Other specified disorders of bone density and structure, unspecified site: Secondary | ICD-10-CM | POA: Diagnosis not present

## 2023-12-27 DIAGNOSIS — E782 Mixed hyperlipidemia: Secondary | ICD-10-CM | POA: Diagnosis not present

## 2023-12-27 DIAGNOSIS — I5032 Chronic diastolic (congestive) heart failure: Secondary | ICD-10-CM | POA: Diagnosis not present

## 2023-12-27 DIAGNOSIS — I110 Hypertensive heart disease with heart failure: Secondary | ICD-10-CM | POA: Diagnosis not present

## 2023-12-27 LAB — LAB REPORT - SCANNED
A1c: 6
Albumin, Urine POC: 0.97
Creatinine, POC: 68 mg/dL
EGFR: 57
Microalb Creat Ratio: 14.4

## 2024-02-24 ENCOUNTER — Encounter: Payer: Self-pay | Admitting: Podiatry

## 2024-02-24 ENCOUNTER — Ambulatory Visit: Payer: Medicare Other | Admitting: Podiatry

## 2024-02-24 DIAGNOSIS — B351 Tinea unguium: Secondary | ICD-10-CM

## 2024-02-24 DIAGNOSIS — M79674 Pain in right toe(s): Secondary | ICD-10-CM | POA: Diagnosis not present

## 2024-02-24 NOTE — Progress Notes (Signed)
 This patient presents  to the office for evaluation and treatment of long thick painful nails .  This patient is unable to trim her own nails since the patient cannot reach her feet.  Patient says the nails are painful walking and wearing his shoes.  He returns for preventive foot care services.  General Appearance  Alert, conversant and in no acute stress.  Vascular  Dorsalis pedis and posterior tibial  pulses are palpable  bilaterally.  Capillary return is within normal limits  bilaterally. Temperature is within normal limits  bilaterally.  Neurologic  Senn-Weinstein monofilament wire test within normal limits  bilaterally. Muscle power within normal limits bilaterally.  Nails Thick disfigured discolored nails with subungual debris  3,4 and 5 toenails  right. No evidence of bacterial infection or drainage bilaterally.  Orthopedic  No limitations of motion  feet .  No crepitus or effusions noted.  No bony pathology or digital deformities noted. HAV  B/L.  Hammer toes 2-5  B/L.  Skin  normotropic skin with no porokeratosis noted bilaterally.  No signs of infections or ulcers noted.     Onychomycosis  Pain in toes right foot    Debridement  of nails  1-5  B/L with a nail nipper.  Nails were then filed using a dremel tool with no incidents.    RTC  10 weeks    Gardiner Barefoot DPM

## 2024-02-28 ENCOUNTER — Other Ambulatory Visit: Payer: Self-pay | Admitting: Internal Medicine

## 2024-03-29 ENCOUNTER — Other Ambulatory Visit: Payer: Self-pay | Admitting: Family Medicine

## 2024-03-29 DIAGNOSIS — Z1231 Encounter for screening mammogram for malignant neoplasm of breast: Secondary | ICD-10-CM

## 2024-04-28 ENCOUNTER — Ambulatory Visit
Admission: RE | Admit: 2024-04-28 | Discharge: 2024-04-28 | Disposition: A | Discharge status: Home / Self Care | Source: Ambulatory Visit | Attending: Family Medicine | Admitting: Family Medicine

## 2024-04-28 DIAGNOSIS — Z1231 Encounter for screening mammogram for malignant neoplasm of breast: Secondary | ICD-10-CM | POA: Diagnosis not present

## 2024-05-04 ENCOUNTER — Other Ambulatory Visit: Payer: Self-pay | Admitting: Family Medicine

## 2024-05-04 DIAGNOSIS — R928 Other abnormal and inconclusive findings on diagnostic imaging of breast: Secondary | ICD-10-CM

## 2024-05-11 ENCOUNTER — Ambulatory Visit: Admission: RE | Admit: 2024-05-11 | Source: Ambulatory Visit

## 2024-05-11 ENCOUNTER — Ambulatory Visit
Admission: RE | Admit: 2024-05-11 | Discharge: 2024-05-11 | Disposition: A | Discharge status: Home / Self Care | Source: Ambulatory Visit | Attending: Family Medicine | Admitting: Family Medicine

## 2024-05-11 DIAGNOSIS — R928 Other abnormal and inconclusive findings on diagnostic imaging of breast: Secondary | ICD-10-CM

## 2024-05-25 ENCOUNTER — Encounter: Payer: Self-pay | Admitting: Internal Medicine

## 2024-05-25 ENCOUNTER — Ambulatory Visit: Admitting: Internal Medicine

## 2024-05-25 VITALS — BP 122/64 | HR 57 | Temp 98.0°F | Ht 65.0 in | Wt 201.0 lb

## 2024-05-25 DIAGNOSIS — J301 Allergic rhinitis due to pollen: Secondary | ICD-10-CM

## 2024-05-25 DIAGNOSIS — J45991 Cough variant asthma: Secondary | ICD-10-CM

## 2024-05-25 MED ORDER — FLUTICASONE FUROATE-VILANTEROL 100-25 MCG/ACT IN AEPB: 1.0000 | INHALATION_SPRAY | Freq: Every day | RESPIRATORY_TRACT | 11 refills | Status: AC

## 2024-05-25 NOTE — Progress Notes (Signed)
 Brittany Robertson    [id number]    08/13/1941  Primary Care Physician:Shaw, Joen, MD Date of Appointment: 05/25/2024 Established Patient Visit  Chief complaint:   Chief Complaint  Patient presents with   Acute Visit   Cough    Hacking dry cough and sneezing      HPI: Brittany Robertson is a 83 y.o. woman with cough variant asthma.   Interval Updates: Here for acute visit she had improvement of her cough symptoms with Breo and prn albuterol . Now here for dry cough and sneezing. Cough worsening over the last several weeks. She is having a flare up of the chronic cough. Dry cough. No dyspnea or wheezing. She does have some rhinorrhea. She has flonase  but doesn't use it.  No sick contacts. Takes zyrtec daily.    I have reviewed the patient's family social and past medical history and updated as appropriate.   Past Medical History:  Diagnosis Date   Ascending aortic aneurysm (HCC)    4 cm by imaging 07/2023   Bruises easily    Chronic diastolic CHF (congestive heart failure) (HCC)    Complication of anesthesia    takes a long time to wake upbp little low when wakes up   Coronary artery disease    PCI 2006, repeat cath 30% mid LAD distal to stent otherwise normal coronary arteries, cath 08/2013 with patent LAD stent and otherwise normal coronary arteries with diastolic dysfunction. Cath 08/2016 with normal RHC, normal LVEDP, patent LAD stent with otherwise normal coronary arteries   DDD (degenerative disc disease), cervical    Dilated aortic root (HCC)    40mm by echo 07/2017   Dysrhythmia    GERD (gastroesophageal reflux disease)    History of kidney stones    Hyperlipidemia    Hypertension    Left ureteral stone    OA (osteoarthritis)    Obesity    Osteopenia    PONV (postoperative nausea and vomiting)    Motion sickness when move out of PACU to room   PVC's (premature ventricular contractions)    SOB (shortness of breath) 07/13/2016   non cardiac and  secondary to deconditioning and obesity    Past Surgical History:  Procedure Laterality Date   ABDOMINAL HYSTERECTOMY     APPENDECTOMY  1957   arthroscopic knee  05/2002,04/07/2005   right and then also torn muscle surgery on right knee (2003(   arthroscopic knee  01/02/2005,09/21/2006   left   CARDIAC CATHETERIZATION N/A 08/31/2016   Procedure: Right/Left Heart Cath and Coronary Angiography;  Surgeon: Ozell Fell, MD;  Location: [facility] INVASIVE CV LAB;  Service: Cardiovascular;  Laterality: N/A;   CHOLECYSTECTOMY  1988   COLONOSCOPY  2010   with Endo   CORONARY ANGIOPLASTY  02/05/2004   CYST REMOVAL HAND  2000   CYSTOSCOPY/URETEROSCOPY/HOLMIUM LASER/STENT PLACEMENT Left 08/26/2018   Procedure: CYSTOSCOPY/RETROGRADE/URETEROSCOPY/HOLMIUM LASER/ STONE BASKETRY/ STENT PLACEMENT;  Surgeon: Devere Lonni Righter, MD;  Location[facility];  Service: Urology;  Laterality: Left;  ONLY NEEDS  45 MIN   CYSTOSCOPY/URETEROSCOPY/HOLMIUM LASER/STENT PLACEMENT Bilateral 09/17/2020   Procedure: CYSTOSCOPY/URETEROSCOPY/HOLMIUM LASER/STENT PLACEMENT;  Surgeon: Devere Lonni Righter, MD;  Location: [facility];  Service: Urology;  Laterality: Bilateral;   DILATION AND CURETTAGE OF UTERUS  1972   EXTRACORPOREAL SHOCK WAVE LITHOTRIPSY Right 12/12/2018   Procedure: EXTRACORPOREAL SHOCK WAVE LITHOTRIPSY (ESWL);  Surgeon: Devere Lonni Righter, MD;  Location: [facility];  Service: Urology;  Laterality: Right;  EYE SURGERY  01/2007   bilateral cataract with lens implant   FOOT SURGERY  01/2012   left and right bunion and hammer toe   HAND SURGERY  05/29/2011   joint removed   HEMORRHOID SURGERY  1969   HERNIA REPAIR  02/2010   incisional hernioa   KNEE ARTHROSCOPY     LEFT HEART CATHETERIZATION WITH CORONARY ANGIOGRAM N/A 09/19/2013   Procedure: LEFT HEART CATHETERIZATION WITH CORONARY ANGIOGRAM;  Surgeon: Wilbert JONELLE Bihari, MD;  Location: [facility] CATH LAB;  Service: Cardiovascular;  Laterality:  N/A;   neck injections  03/2014   back and neck injections from Dr. Bonner   stents     x1   tear duct surgery  1990's   bilateral   TONSILLECTOMY     as child   TOTAL KNEE ARTHROPLASTY Left 01/04/2015   Procedure: LEFT TOTAL KNEE ARTHROPLASTY;  Surgeon: Lamar Collet, MD;  Location: [facility];  Service: Orthopedics;  Laterality: Left;   TOTAL KNEE ARTHROPLASTY Right 05/10/2015   Procedure: TOTAL RIGHT KNEE ARTHROPLASTY;  Surgeon: Lamar Collet, MD;  Location: [facility];  Service: Orthopedics;  Laterality: Right;    Family History  Problem Relation Age of Onset   Breast cancer Mother    Hypertension Mother    Cancer Mother    Hypertension Father    Heart Problems Father    Breast cancer Daughter        70s    Social History   Occupational History   Not on file  Tobacco Use   Smoking status: Never    Passive exposure: Past   Smokeless tobacco: Never  Vaping Use   Vaping status: Never Used  Substance and Sexual Activity   Alcohol  use: Yes    Alcohol /week: 1.0 standard drink of alcohol     Types: 1 Glasses of wine per week    Comment: occassionally   Drug use: No   Sexual activity: Not on file     Physical Exam: Blood pressure 122/64, pulse (!) 57, temperature 98 F (36.7 C), temperature source Oral, height 5' 5 (1.651 m), weight 201 lb (91.2 kg), SpO2 98%.  Gen:      No acute distress, elderly, walks with cane ENT:  mild cobblestoning, no nasal polyps, mucus membranes moist Lungs:    No increased respiratory effort, symmetric chest wall excursion, clear to auscultation bilaterally, no wheezes or crackles CV:         Regular rate and rhythm; no murmurs, rubs, or gallops.  No pedal edema   Data Reviewed: Imaging: I have personally reviewed the ct angio shows 4.0 cm thoracic aortic aneurysmal dilation. Lungs clear.   PFTs:     Latest Ref Rng & Units 06/01/2022    9:34 AM 05/25/2016    9:31 AM  PFT Results  FVC-Pre L 2.43  2.59   FVC-Predicted Pre % 98  94    FVC-Post L 2.64  2.68   FVC-Predicted Post % 106  98   Pre FEV1/FVC % % 80  79   Post FEV1/FCV % % 83  84   FEV1-Pre L 1.95  2.05   FEV1-Predicted Pre % 106  100   FEV1-Post L 2.19  2.24   DLCO uncorrected ml/min/mmHg 16.20  14.85   DLCO UNC% % 87  61   DLCO corrected ml/min/mmHg 16.62    DLCO COR %Predicted % 89    DLVA Predicted % 88  78   TLC L 5.18  4.71   TLC %  Predicted % 102  93   RV % Predicted % 101  80    I have personally reviewed the patient's PFTs and normal pft  Labs: Lab Results  Component Value Date   NA 140 06/17/2023   K 4.8 06/17/2023   CO2 23 06/17/2023   GLUCOSE 97 06/17/2023   BUN 23 06/17/2023   CREATININE 0.90 06/17/2023   CALCIUM  9.6 06/17/2023   GFR 66.45 12/07/2014   EGFR 57.0 12/27/2023   GFRNONAA >60 09/11/2020   Lab Results  Component Value Date   WBC 6.3 04/22/2022   HGB 12.6 04/22/2022   HCT 38.0 04/22/2022   MCV 97.3 04/22/2022   PLT 183.0 04/22/2022    Immunization status: Immunization History  Administered Date(s) Administered   Fluad Quad(high Dose 65+) 07/13/2022   Influenza, High Dose Seasonal PF 06/29/2016   Influenza,inj,Quad PF,6+ Mos 05/20/2015   Influenza-Unspecified 07/31/2023   Pneumococcal Conjugate-13 10/19/2013   Unspecified SARS-COV-2 Vaccination 02/14/2021    External Records Personally Reviewed: pulmonary  Assessment:  Cough Variant asthma Allergic rhiitis, not well controlled.   Plan/Recommendations:  Continue the Breo 1 puff once a day, gargle after use.   Your cough and sneezing is related to post nasal drainage.  Resume flonase  nasal spray. If resuming flonase  after a week does not help you can try astelin . Continue zyrtec.     Return to Care: Return in about 1 year (around 05/25/2025).   Verdon Gore, MD Pulmonary and Critical Care Medicine [facility] Office:[phone]

## 2024-05-25 NOTE — Patient Instructions (Addendum)
 It was a pleasure to see you today!  Please schedule follow up with APP or MD  in 1 year  If my schedule is not open yet, we will contact you with a reminder closer to that time. Please call [phone] if you haven't heard from us  a month before, and always call us  sooner if issues or concerns arise. You can also send us  a message through MyChart, but but aware that this is not to be used for urgent issues and it may take up to 5-7 days to receive a reply. Please be aware that you will likely be able to view your results before I have a chance to respond to them. Please give us  5 business days to respond to any non-urgent results.   Continue the Breo 1 puff once a day, gargle after use.   Your cough and sneezing is related to post nasal drainage.  Resume flonase  nasal spray. If resuming flonase  after a week does not help you can try astelin . Continue zyrtec.   Flonase  - 1 spray on each side of your nose twice a day for first week, then 1 spray on each side.   Instructions for use: If you also use a saline nasal spray or rinse, use that first. Position the head with the chin slightly tucked. Use the right hand to spray into the left nostril and the right hand to spray into the left nostril.   Point the bottle away from the septum of your nose (cartilage that divides the two sides of your nose).  Hold the nostril closed on the opposite side from where you will spray Spray once and gently sniff to pull the medicine into the higher parts of your nose.  Don't sniff too hard as the medicine will drain down the back of your throat instead. Repeat with a second spray on the same side if prescribed. Repeat on the other side of your nose.

## 2024-05-26 ENCOUNTER — Encounter: Payer: Self-pay | Admitting: Podiatry

## 2024-05-26 ENCOUNTER — Ambulatory Visit: Admitting: Podiatry

## 2024-05-26 DIAGNOSIS — M79674 Pain in right toe(s): Secondary | ICD-10-CM | POA: Diagnosis not present

## 2024-05-26 DIAGNOSIS — B351 Tinea unguium: Secondary | ICD-10-CM

## 2024-05-26 NOTE — Progress Notes (Signed)
 This patient presents  to the office for evaluation and treatment of long thick painful nails .  This patient is unable to trim her own nails since the patient cannot reach her feet.  Patient says the nails are painful walking and wearing his shoes.  He returns for preventive foot care services.  General Appearance  Alert, conversant and in no acute stress.  Vascular  Dorsalis pedis and posterior tibial  pulses are palpable  bilaterally.  Capillary return is within normal limits  bilaterally. Temperature is within normal limits  bilaterally.  Neurologic  Senn-Weinstein monofilament wire test within normal limits  bilaterally. Muscle power within normal limits bilaterally.  Nails Thick disfigured discolored nails with subungual debris  3,4 and 5 toenails  right. No evidence of bacterial infection or drainage bilaterally.  Orthopedic  No limitations of motion  feet .  No crepitus or effusions noted.  No bony pathology or digital deformities noted. HAV  B/L.  Hammer toes 2-5 right   Skin  normotropic skin with no porokeratosis noted bilaterally.  No signs of infections or ulcers noted.     Onychomycosis  Pain in toes right foot    Debridement  of nails  1-5  B/L with a nail nipper.  Nails were then filed using a dremel tool with no incidents.    RTC  10 weeks    Cordella Bold DPM

## 2024-06-02 DIAGNOSIS — H3321 Serous retinal detachment, right eye: Secondary | ICD-10-CM | POA: Diagnosis not present

## 2024-06-02 DIAGNOSIS — H33021 Retinal detachment with multiple breaks, right eye: Secondary | ICD-10-CM | POA: Diagnosis not present

## 2024-06-02 DIAGNOSIS — I2510 Atherosclerotic heart disease of native coronary artery without angina pectoris: Secondary | ICD-10-CM | POA: Diagnosis not present

## 2024-06-02 DIAGNOSIS — K219 Gastro-esophageal reflux disease without esophagitis: Secondary | ICD-10-CM | POA: Diagnosis not present

## 2024-06-02 DIAGNOSIS — I10 Essential (primary) hypertension: Secondary | ICD-10-CM | POA: Diagnosis not present

## 2024-06-02 DIAGNOSIS — H33001 Unspecified retinal detachment with retinal break, right eye: Secondary | ICD-10-CM | POA: Diagnosis not present

## 2024-06-03 DIAGNOSIS — H33021 Retinal detachment with multiple breaks, right eye: Secondary | ICD-10-CM | POA: Diagnosis not present

## 2024-06-03 DIAGNOSIS — H33001 Unspecified retinal detachment with retinal break, right eye: Secondary | ICD-10-CM | POA: Diagnosis not present

## 2024-06-08 DIAGNOSIS — H33001 Unspecified retinal detachment with retinal break, right eye: Secondary | ICD-10-CM | POA: Diagnosis not present

## 2024-06-18 ENCOUNTER — Other Ambulatory Visit: Payer: Self-pay | Admitting: Cardiology

## 2024-06-27 DIAGNOSIS — H33001 Unspecified retinal detachment with retinal break, right eye: Secondary | ICD-10-CM | POA: Diagnosis not present

## 2024-06-30 DIAGNOSIS — R7303 Prediabetes: Secondary | ICD-10-CM | POA: Diagnosis not present

## 2024-06-30 DIAGNOSIS — E782 Mixed hyperlipidemia: Secondary | ICD-10-CM | POA: Diagnosis not present

## 2024-06-30 DIAGNOSIS — M8580 Other specified disorders of bone density and structure, unspecified site: Secondary | ICD-10-CM | POA: Diagnosis not present

## 2024-06-30 DIAGNOSIS — R059 Cough, unspecified: Secondary | ICD-10-CM | POA: Diagnosis not present

## 2024-06-30 DIAGNOSIS — H33309 Unspecified retinal break, unspecified eye: Secondary | ICD-10-CM | POA: Diagnosis not present

## 2024-06-30 DIAGNOSIS — I110 Hypertensive heart disease with heart failure: Secondary | ICD-10-CM | POA: Diagnosis not present

## 2024-06-30 DIAGNOSIS — Z23 Encounter for immunization: Secondary | ICD-10-CM | POA: Diagnosis not present

## 2024-07-16 ENCOUNTER — Other Ambulatory Visit: Payer: Self-pay | Admitting: Cardiology

## 2024-07-18 ENCOUNTER — Other Ambulatory Visit: Payer: Self-pay | Admitting: Cardiology

## 2024-07-20 ENCOUNTER — Telehealth: Payer: Self-pay | Admitting: Cardiology

## 2024-07-20 MED ORDER — EZETIMIBE 10 MG PO TABS: 10.0000 mg | ORAL_TABLET | Freq: Every evening | ORAL | 1 refills | Status: DC

## 2024-07-20 NOTE — Telephone Encounter (Signed)
 Pt's medication was sent to pt's pharmacy as requested. Confirmation received.

## 2024-07-20 NOTE — Telephone Encounter (Signed)
*  STAT* If patient is at the pharmacy, call can be transferred to refill team.   1. Which medications need to be refilled? (please list name of each medication and dose if known)   ezetimibe  (ZETIA ) 10 MG tablet    2. Which pharmacy/location (including street and city if local pharmacy) is medication to be sent to? [facility] [place], [place] - [address]   3. Do they need a 30 day or 90 day supply? 90  Patient has appt on 11/4

## 2024-07-25 ENCOUNTER — Other Ambulatory Visit: Payer: Self-pay | Admitting: Cardiology

## 2024-08-01 DIAGNOSIS — H33001 Unspecified retinal detachment with retinal break, right eye: Secondary | ICD-10-CM | POA: Diagnosis not present

## 2024-08-07 ENCOUNTER — Ambulatory Visit: Admitting: Podiatry

## 2024-08-07 ENCOUNTER — Encounter: Payer: Self-pay | Admitting: Podiatry

## 2024-08-07 DIAGNOSIS — M79674 Pain in right toe(s): Secondary | ICD-10-CM | POA: Diagnosis not present

## 2024-08-07 DIAGNOSIS — B351 Tinea unguium: Secondary | ICD-10-CM | POA: Diagnosis not present

## 2024-08-07 NOTE — Progress Notes (Signed)
 This patient presents  to the office for evaluation and treatment of long thick painful nails .  This patient is unable to trim her own nails since the patient cannot reach her feet.  Patient says the nails are painful walking and wearing his shoes.  He returns for preventive foot care services.  General Appearance  Alert, conversant and in no acute stress.  Vascular  Dorsalis pedis and posterior tibial  pulses are palpable  bilaterally.  Capillary return is within normal limits  bilaterally. Temperature is within normal limits  bilaterally.  Neurologic  Senn-Weinstein monofilament wire test within normal limits  bilaterally. Muscle power within normal limits bilaterally.  Nails Thick disfigured discolored nails with subungual debris  3,4 and 5 toenails  right. No evidence of bacterial infection or drainage bilaterally.  Orthopedic  No limitations of motion  feet .  No crepitus or effusions noted.  No bony pathology or digital deformities noted. HAV  B/L.  Hammer toes 2-5 right   Skin  normotropic skin with no porokeratosis noted bilaterally.  No signs of infections or ulcers noted.     Onychomycosis  Pain in toes right foot    Debridement  of nails  1-5  B/L with a nail nipper.  Nails were then filed using a dremel tool with no incidents.    RTC  10 weeks    Cordella Bold DPM

## 2024-08-08 NOTE — Progress Notes (Signed)
 Cardiology Office Note:  .   Date:  08/22/2024  ID:  KANCHAN GAL, DOB 1940/12/19, MRN [id number] PCP: Loreli Kins, MD  [facility] Providers Cardiologist:  Wilbert Bihari, MD    History of Present Illness: .   KENNEDIE PARDOE is a 83 y.o. female with a hx of  ASCAD s/p PCI the LAD in 2006 with repeat cardiac cath in 2014 showing 30% mid LAD distal to the stent and otherwise normal coronary arteries and repeat heart cath 08/2016 showed normal coronary arteries with patent LAD stent and normal LVEDP.  Her shortness of breath is felt to be due to obesity and deconditioning. Normal NST 2022.  She has a history of hypertension, obesity, chronic diastolic CHF and hyperlipidemia.  She also has a history of mild aortic aneurysm with the ascending aorta measuring 4.2 cm.  Patient here with her husband and daughter. She's having pain in left side of her head where she had shingles a few years ago. It's a sharp pain but not a headache. She had some chest tightness yesterday. Tylenol  relieved both. Whe does a lot of puzzles and painting and leaning over that may aggravate. Not very active. Moves around the house with a cane-no chest pain. She had been painting for 2 1/2 hrs yest and then developed soreness in back and chest and sharp shooting pains in head all relieved with tylenol  in 20 min.  ROS:    Studies Reviewed: SABRA    EKG Interpretation Date/Time:  Tuesday August 22 2024 10:43:54 EST Ventricular Rate:  57 PR Interval:  144 QRS Duration:  96 QT Interval:  422 QTC Calculation: 410 R Axis:   20  Text Interpretation: Sinus bradycardia Cannot rule out Anterior infarct , age undetermined When compared with ECG of 17-Jun-2023 13:39, Confirmed by Parthenia Klinefelter [phone]) on 08/22/2024 10:52:05 AM    Prior CV Studies:   NST 2022 The left ventricular ejection fraction is hyperdynamic (>65%). Nuclear stress EF: 70%. There was no ST segment deviation noted during stress. The study is  normal. This is a low risk study.   Normal resting and stress perfusion. No ischemia or infarction EF 70%  CT angio 07/2023 MPRESSION: 1. Stable mild aneurysmal dilation of the ascending thoracic aorta with a maximal diameter of 4.0 cm. Recommend annual imaging followup by CTA or MRA. This recommendation follows 2010 ACCF/AHA/AATS/ACR/ASA/SCA/SCAI/SIR/STS/SVM Guidelines for the Diagnosis and Management of Patients with Thoracic Aortic Disease. Circulation. 2010; 121: Z733-z630. Aortic aneurysm NOS (ICD10-I71.9) 2. Stable 1.8 cm peripherally calcified splenic artery aneurysm dating back to March of 2021. 3. Borderline cardiomegaly. 4. Calcified aortic atherosclerotic plaque. Aortic Atherosclerosis (ICD10-I70.0).     Electronically Signed   By: Wilkie Lent M.D.   On: 07/23/2023 07:21   Risk Assessment/Calculations:             Physical Exam:   VS:  BP 106/60 (BP Location: Left Arm, Patient Position: Sitting, Cuff Size: Normal)   Pulse (!) 57   Ht 5' 5 (1.651 m)   Wt 201 lb (91.2 kg)   BMI 33.45 kg/m    Orhtostatics: No data found. Wt Readings from Last 3 Encounters:  08/22/24 201 lb (91.2 kg)  05/25/24 201 lb (91.2 kg)  09/09/23 206 lb 6.4 oz (93.6 kg)    GEN: Obese, in no acute distress NECK: No JVD; No carotid bruits CARDIAC:  RRR, 1/6 systolic murmur LSB, no rubs, gallops RESPIRATORY:  Clear to auscultation without rales, wheezing or rhonchi  ABDOMEN:  Soft, non-tender, non-distended EXTREMITIES:  No edema; No deformity   ASSESSMENT AND PLAN: .    ASCAD -S/p PCI of the LAD in 2006 -repeat cardiac cath in 2014 showing 30% mid LAD distal to the stent and otherwise normal coronary arteries and repeat heart cath 08/2016 showed normal coronary arteries with patent LAD stent and normal LVEDP, normal NST 2022. -brief chest tightness yest after painting for 2 1/2 hrs relieved with tylenol . Will continue to monitor -Continue aspirin  81 mg daily, Crestor  20 mg  daily and Toprol -XL 12.5 mg daily      HLD -LDL goal is < 70 -check labs today -Continue Crestor  20 mg daily and Zetia  10 mg daily      HTN -BP running low on Toprol -XL 12.5 mg daily and olmesartan  40 mg daily -family will monitor at home. Can always reduce olmesartan    Ascending aortic dilatation -2D echo showed 3.5 x 4.2cm and 3.8 x 4.2cm on Chest CT 12/2019 -Chest CT 07/2023 with ascending aorta measuring 4 cm -Repeat chest CTA   Chronic diastolic CHF -She appears euvolemic on exam today -She has not required diuretic therapy  Left sided head pain off/on 2 weeks. Needs to f/u with PCP          Dispo: f/u Dr. Shlomo 1 yr  Signed, Olivia Pavy, PA-C

## 2024-08-22 ENCOUNTER — Encounter: Payer: Self-pay | Admitting: Physician Assistant

## 2024-08-22 ENCOUNTER — Ambulatory Visit: Attending: Physician Assistant | Admitting: Physician Assistant

## 2024-08-22 VITALS — BP 106/60 | HR 57 | Ht 65.0 in | Wt 201.0 lb

## 2024-08-22 DIAGNOSIS — I5032 Chronic diastolic (congestive) heart failure: Secondary | ICD-10-CM

## 2024-08-22 DIAGNOSIS — I77810 Thoracic aortic ectasia: Secondary | ICD-10-CM

## 2024-08-22 DIAGNOSIS — I2510 Atherosclerotic heart disease of native coronary artery without angina pectoris: Secondary | ICD-10-CM

## 2024-08-22 DIAGNOSIS — I2583 Coronary atherosclerosis due to lipid rich plaque: Secondary | ICD-10-CM

## 2024-08-22 DIAGNOSIS — I10 Essential (primary) hypertension: Secondary | ICD-10-CM | POA: Diagnosis not present

## 2024-08-22 DIAGNOSIS — E785 Hyperlipidemia, unspecified: Secondary | ICD-10-CM

## 2024-08-22 DIAGNOSIS — R519 Headache, unspecified: Secondary | ICD-10-CM

## 2024-08-22 LAB — LIPID PANEL

## 2024-08-22 NOTE — Patient Instructions (Addendum)
 Medication Instructions:  NO CHANGES  Lab Work: CMET, CBC, FASTING LIPID PANEL, AND TSH TO BE DONE TODAY.  Testing/Procedures: Non-Cardiac CT Angiography (CTA), is a special type of CT scan that uses a computer to produce multi-dimensional views of major blood vessels throughout the body. In CT angiography, a contrast material is injected through an IV to help visualize the blood vessels   Follow-Up: At [facility], you and your health needs are our priority.  As part of our continuing mission to provide you with exceptional heart care, our providers are all part of one team.  This team includes your primary Cardiologist (physician) and Advanced Practice Providers or APPs (Physician Assistants and Nurse Practitioners) who all work together to provide you with the care you need, when you need it.  Your next appointment:   1 YEAR  Provider:   Wilbert Bihari, MD

## 2024-08-23 ENCOUNTER — Ambulatory Visit: Payer: Self-pay | Admitting: Physician Assistant

## 2024-08-24 ENCOUNTER — Other Ambulatory Visit: Payer: Self-pay | Admitting: Cardiology

## 2024-08-25 LAB — CBC
Hematocrit: 39.3 % (ref 34.0–46.6)
Hemoglobin: 12.9 g/dL (ref 11.1–15.9)
MCH: 32.5 pg (ref 26.6–33.0)
MCHC: 32.8 g/dL (ref 31.5–35.7)
MCV: 99 fL — ABNORMAL HIGH (ref 79–97)
Platelets: 205 x10E3/uL (ref 150–450)
RBC: 3.97 x10E6/uL (ref 3.77–5.28)
RDW: 12 % (ref 11.7–15.4)
WBC: 6 x10E3/uL (ref 3.4–10.8)

## 2024-08-25 LAB — COMPREHENSIVE METABOLIC PANEL WITH GFR
ALT: 17 IU/L (ref 0–32)
AST: 21 IU/L (ref 0–40)
Albumin: 4.2 g/dL (ref 3.7–4.7)
Alkaline Phosphatase: 49 IU/L (ref 48–129)
BUN/Creatinine Ratio: 24 (ref 12–28)
BUN: 21 mg/dL (ref 8–27)
Bilirubin Total: 0.7 mg/dL (ref 0.0–1.2)
CO2: 21 mmol/L (ref 20–29)
Calcium: 9.3 mg/dL (ref 8.7–10.3)
Chloride: 104 mmol/L (ref 96–106)
Creatinine, Ser: 0.89 mg/dL (ref 0.57–1.00)
Globulin, Total: 2.1 g/dL (ref 1.5–4.5)
Glucose: 100 mg/dL — AB (ref 70–99)
Potassium: 5.2 mmol/L (ref 3.5–5.2)
Sodium: 139 mmol/L (ref 134–144)
Total Protein: 6.3 g/dL (ref 6.0–8.5)
eGFR: 63 mL/min/1.73 (ref >59)

## 2024-08-25 LAB — LIPID PANEL
Cholesterol, Total: 116 mg/dL (ref 100–199)
HDL: 52 mg/dL (ref >39)
LDL CALC COMMENT:: 2.2 ratio (ref 0.0–4.4)
LDL Chol Calc (NIH): 43 mg/dL (ref 0–99)
Triglycerides: 116 mg/dL (ref 0–149)
VLDL Cholesterol Cal: 21 mg/dL (ref 5–40)

## 2024-08-25 LAB — TSH: TSH: 3.2 u[IU]/mL (ref 0.450–4.500)

## 2024-08-28 ENCOUNTER — Ambulatory Visit (HOSPITAL_BASED_OUTPATIENT_CLINIC_OR_DEPARTMENT_OTHER)
Admission: RE | Admit: 2024-08-28 | Discharge: 2024-08-28 | Disposition: A | Discharge status: Home / Self Care | Source: Ambulatory Visit | Attending: Physician Assistant | Admitting: Physician Assistant

## 2024-08-28 DIAGNOSIS — I10 Essential (primary) hypertension: Secondary | ICD-10-CM | POA: Insufficient documentation

## 2024-08-28 DIAGNOSIS — R519 Headache, unspecified: Secondary | ICD-10-CM | POA: Insufficient documentation

## 2024-08-28 DIAGNOSIS — E785 Hyperlipidemia, unspecified: Secondary | ICD-10-CM | POA: Insufficient documentation

## 2024-08-28 DIAGNOSIS — I77810 Thoracic aortic ectasia: Secondary | ICD-10-CM | POA: Insufficient documentation

## 2024-08-28 DIAGNOSIS — I2510 Atherosclerotic heart disease of native coronary artery without angina pectoris: Secondary | ICD-10-CM | POA: Insufficient documentation

## 2024-08-28 DIAGNOSIS — I2583 Coronary atherosclerosis due to lipid rich plaque: Secondary | ICD-10-CM | POA: Diagnosis present

## 2024-08-28 DIAGNOSIS — I5032 Chronic diastolic (congestive) heart failure: Secondary | ICD-10-CM | POA: Diagnosis present

## 2024-08-28 MED ORDER — IOHEXOL 350 MG/ML SOLN
75.0000 mL | Freq: Once | INTRAVENOUS | Status: AC | PRN
  Administered 2024-08-28: 75 mL via INTRAVENOUS

## 2024-09-03 ENCOUNTER — Other Ambulatory Visit: Payer: Self-pay | Admitting: Cardiology

## 2024-09-06 ENCOUNTER — Other Ambulatory Visit: Payer: Self-pay | Admitting: Cardiology

## 2024-09-30 ENCOUNTER — Other Ambulatory Visit: Payer: Self-pay | Admitting: Cardiology

## 2024-11-06 ENCOUNTER — Ambulatory Visit: Admitting: Podiatry

## 2024-11-06 ENCOUNTER — Encounter: Payer: Self-pay | Admitting: Podiatry

## 2024-11-06 DIAGNOSIS — B351 Tinea unguium: Secondary | ICD-10-CM

## 2024-11-06 DIAGNOSIS — M79674 Pain in right toe(s): Secondary | ICD-10-CM | POA: Diagnosis not present

## 2024-11-06 NOTE — Progress Notes (Signed)
 This patient presents  to the office for evaluation and treatment of long thick painful nails .  This patient is unable to trim her own nails since the patient cannot reach her feet.  Patient says the nails are painful walking and wearing his shoes.  He returns for preventive foot care services.  General Appearance  Alert, conversant and in no acute stress.  Vascular  Dorsalis pedis and posterior tibial  pulses are palpable  bilaterally.  Capillary return is within normal limits  bilaterally. Temperature is within normal limits  bilaterally.  Neurologic  Senn-Weinstein monofilament wire test within normal limits  bilaterally. Muscle power within normal limits bilaterally.  Nails Thick disfigured discolored nails with subungual debris  3,4 and 5 toenails  right. No evidence of bacterial infection or drainage bilaterally.  Orthopedic  No limitations of motion  feet .  No crepitus or effusions noted.  No bony pathology or digital deformities noted. HAV  B/L.  Hammer toes 2-5 right   Skin  normotropic skin with no porokeratosis noted bilaterally.  No signs of infections or ulcers noted.     Onychomycosis  Pain in toes right foot    Debridement  of nails  1-5  B/L with a nail nipper.  Nails were then filed using a dremel tool with no incidents.    RTC  10 weeks    Cordella Bold DPM

## 2025-02-05 ENCOUNTER — Ambulatory Visit: Admitting: Podiatry
